# Patient Record
Sex: Male | Born: 1945 | Race: Black or African American | Hispanic: No | Marital: Married | Smoking: Former smoker
Health system: Southern US, Community
[De-identification: ages and names within clinical notes are randomized; demographics above are authoritative.]

## PROBLEM LIST (undated history)

## (undated) DIAGNOSIS — R569 Unspecified convulsions: Secondary | ICD-10-CM

## (undated) DIAGNOSIS — R0602 Shortness of breath: Secondary | ICD-10-CM

## (undated) DIAGNOSIS — I2510 Atherosclerotic heart disease of native coronary artery without angina pectoris: Secondary | ICD-10-CM

## (undated) DIAGNOSIS — I639 Cerebral infarction, unspecified: Secondary | ICD-10-CM

## (undated) DIAGNOSIS — IMO0001 Reserved for inherently not codable concepts without codable children: Secondary | ICD-10-CM

## (undated) DIAGNOSIS — Z923 Personal history of irradiation: Secondary | ICD-10-CM

## (undated) DIAGNOSIS — J189 Pneumonia, unspecified organism: Secondary | ICD-10-CM

## (undated) DIAGNOSIS — C159 Malignant neoplasm of esophagus, unspecified: Secondary | ICD-10-CM

## (undated) DIAGNOSIS — IMO0002 Reserved for concepts with insufficient information to code with codable children: Secondary | ICD-10-CM

## (undated) DIAGNOSIS — J449 Chronic obstructive pulmonary disease, unspecified: Secondary | ICD-10-CM

## (undated) DIAGNOSIS — I10 Essential (primary) hypertension: Secondary | ICD-10-CM

## (undated) HISTORY — DX: Chronic obstructive pulmonary disease, unspecified: J44.9

## (undated) HISTORY — PX: CORONARY ARTERY BYPASS GRAFT: SHX141

## (undated) HISTORY — DX: Personal history of irradiation: Z92.3

## (undated) HISTORY — PX: CARDIAC VALVE SURGERY: SHX40

## (undated) HISTORY — DX: Cerebral infarction, unspecified: I63.9

## (undated) HISTORY — PX: CARDIAC CATHETERIZATION: SHX172

## (undated) HISTORY — DX: Malignant neoplasm of esophagus, unspecified: C15.9

---

## 1998-03-26 ENCOUNTER — Emergency Department (HOSPITAL_COMMUNITY): Admission: EM | Admit: 1998-03-26 | Discharge: 1998-03-26 | Payer: Self-pay

## 1998-06-24 ENCOUNTER — Emergency Department (HOSPITAL_COMMUNITY): Admission: EM | Admit: 1998-06-24 | Discharge: 1998-06-25 | Payer: Self-pay | Admitting: Emergency Medicine

## 1998-06-25 ENCOUNTER — Encounter: Payer: Self-pay | Admitting: Emergency Medicine

## 1998-06-25 ENCOUNTER — Emergency Department (HOSPITAL_COMMUNITY): Admission: EM | Admit: 1998-06-25 | Discharge: 1998-06-25 | Payer: Self-pay | Admitting: Emergency Medicine

## 1999-07-03 ENCOUNTER — Emergency Department (HOSPITAL_COMMUNITY): Admission: EM | Admit: 1999-07-03 | Discharge: 1999-07-03 | Payer: Self-pay | Admitting: Emergency Medicine

## 1999-07-03 ENCOUNTER — Encounter: Payer: Self-pay | Admitting: Emergency Medicine

## 1999-10-29 ENCOUNTER — Emergency Department (HOSPITAL_COMMUNITY): Admission: EM | Admit: 1999-10-29 | Discharge: 1999-10-29 | Payer: Self-pay

## 2000-10-14 ENCOUNTER — Emergency Department (HOSPITAL_COMMUNITY): Admission: EM | Admit: 2000-10-14 | Discharge: 2000-10-14 | Payer: Self-pay | Admitting: *Deleted

## 2001-04-27 ENCOUNTER — Encounter: Payer: Self-pay | Admitting: Emergency Medicine

## 2001-04-27 ENCOUNTER — Emergency Department (HOSPITAL_COMMUNITY): Admission: EM | Admit: 2001-04-27 | Discharge: 2001-04-28 | Payer: Self-pay | Admitting: Emergency Medicine

## 2001-07-21 ENCOUNTER — Encounter (INDEPENDENT_AMBULATORY_CARE_PROVIDER_SITE_OTHER): Payer: Self-pay | Admitting: *Deleted

## 2001-07-21 ENCOUNTER — Encounter: Payer: Self-pay | Admitting: Neurosurgery

## 2001-07-21 ENCOUNTER — Inpatient Hospital Stay (HOSPITAL_COMMUNITY): Admission: EM | Admit: 2001-07-21 | Discharge: 2001-08-01 | Payer: Self-pay | Admitting: *Deleted

## 2001-07-22 ENCOUNTER — Encounter: Payer: Self-pay | Admitting: Neurosurgery

## 2001-07-23 ENCOUNTER — Encounter: Payer: Self-pay | Admitting: Neurosurgery

## 2001-07-24 ENCOUNTER — Encounter: Payer: Self-pay | Admitting: Neurosurgery

## 2001-07-26 ENCOUNTER — Encounter: Payer: Self-pay | Admitting: Neurosurgery

## 2001-07-27 ENCOUNTER — Encounter: Payer: Self-pay | Admitting: Neurosurgery

## 2001-07-30 ENCOUNTER — Encounter: Payer: Self-pay | Admitting: Neurosurgery

## 2002-02-01 ENCOUNTER — Encounter: Payer: Self-pay | Admitting: Neurosurgery

## 2002-02-03 ENCOUNTER — Inpatient Hospital Stay (HOSPITAL_COMMUNITY): Admission: RE | Admit: 2002-02-03 | Discharge: 2002-02-04 | Payer: Self-pay | Admitting: Neurosurgery

## 2002-06-11 ENCOUNTER — Emergency Department (HOSPITAL_COMMUNITY): Admission: EM | Admit: 2002-06-11 | Discharge: 2002-06-11 | Payer: Self-pay | Admitting: Emergency Medicine

## 2002-06-11 ENCOUNTER — Encounter: Payer: Self-pay | Admitting: General Surgery

## 2005-03-10 ENCOUNTER — Emergency Department (HOSPITAL_COMMUNITY): Admission: EM | Admit: 2005-03-10 | Discharge: 2005-03-10 | Payer: Self-pay | Admitting: Emergency Medicine

## 2005-08-27 ENCOUNTER — Emergency Department (HOSPITAL_COMMUNITY): Admission: EM | Admit: 2005-08-27 | Discharge: 2005-08-27 | Payer: Self-pay | Admitting: Emergency Medicine

## 2005-11-21 ENCOUNTER — Emergency Department (HOSPITAL_COMMUNITY): Admission: EM | Admit: 2005-11-21 | Discharge: 2005-11-21 | Payer: Self-pay | Admitting: Emergency Medicine

## 2006-02-26 ENCOUNTER — Emergency Department (HOSPITAL_COMMUNITY): Admission: EM | Admit: 2006-02-26 | Discharge: 2006-02-26 | Payer: Self-pay | Admitting: Emergency Medicine

## 2007-03-02 ENCOUNTER — Emergency Department (HOSPITAL_COMMUNITY): Admission: EM | Admit: 2007-03-02 | Discharge: 2007-03-02 | Payer: Self-pay | Admitting: Emergency Medicine

## 2007-09-23 ENCOUNTER — Emergency Department (HOSPITAL_COMMUNITY): Admission: EM | Admit: 2007-09-23 | Discharge: 2007-09-23 | Payer: Self-pay | Admitting: Emergency Medicine

## 2007-12-21 ENCOUNTER — Emergency Department (HOSPITAL_COMMUNITY): Admission: EM | Admit: 2007-12-21 | Discharge: 2007-12-21 | Payer: Self-pay | Admitting: Emergency Medicine

## 2008-09-19 ENCOUNTER — Emergency Department (HOSPITAL_COMMUNITY): Admission: EM | Admit: 2008-09-19 | Discharge: 2008-09-19 | Payer: Self-pay | Admitting: Internal Medicine

## 2009-06-01 ENCOUNTER — Emergency Department (HOSPITAL_COMMUNITY): Admission: EM | Admit: 2009-06-01 | Discharge: 2009-06-01 | Payer: Self-pay | Admitting: Emergency Medicine

## 2009-06-20 ENCOUNTER — Encounter: Admission: RE | Admit: 2009-06-20 | Discharge: 2009-06-20 | Payer: Self-pay | Admitting: Internal Medicine

## 2009-10-08 ENCOUNTER — Emergency Department (HOSPITAL_COMMUNITY): Admission: EM | Admit: 2009-10-08 | Discharge: 2009-10-09 | Payer: Self-pay | Admitting: Emergency Medicine

## 2010-08-05 LAB — DIFFERENTIAL
Eosinophils Absolute: 0.1 10*3/uL (ref 0.0–0.7)
Eosinophils Relative: 1 % (ref 0–5)
Lymphocytes Relative: 32 % (ref 12–46)
Monocytes Absolute: 0.6 10*3/uL (ref 0.1–1.0)
Neutro Abs: 3.8 10*3/uL (ref 1.7–7.7)
Neutrophils Relative %: 58 % (ref 43–77)

## 2010-08-05 LAB — CBC
MCV: 89.5 fL (ref 78.0–100.0)
Platelets: 167 10*3/uL (ref 150–400)
RDW: 15 % (ref 11.5–15.5)

## 2010-08-05 LAB — PROTIME-INR
INR: 1.14 (ref 0.00–1.49)
Prothrombin Time: 14.5 seconds (ref 11.6–15.2)

## 2010-08-05 LAB — BASIC METABOLIC PANEL
BUN: 16 mg/dL (ref 6–23)
Chloride: 103 mEq/L (ref 96–112)
GFR calc Af Amer: >60 mL/min (ref >60)
GFR calc non Af Amer: >60 mL/min (ref >60)
Glucose, Bld: 120 mg/dL — ABNORMAL HIGH (ref 70–99)
Potassium: 3.8 mEq/L (ref 3.5–5.1)

## 2010-08-05 LAB — APTT: aPTT: 29 seconds (ref 24–37)

## 2010-08-05 LAB — GLUCOSE, CAPILLARY

## 2010-08-27 LAB — RAPID URINE DRUG SCREEN, HOSP PERFORMED
Benzodiazepines: NOT DETECTED
Cocaine: NOT DETECTED
Tetrahydrocannabinol: NOT DETECTED

## 2010-08-27 LAB — DIFFERENTIAL
Basophils Absolute: 0 10*3/uL (ref 0.0–0.1)
Basophils Relative: 0 % (ref 0–1)
Eosinophils Relative: 1 % (ref 0–5)
Lymphs Abs: 1.9 10*3/uL (ref 0.7–4.0)
Monocytes Absolute: 0.6 10*3/uL (ref 0.1–1.0)
Neutrophils Relative %: 57 % (ref 43–77)

## 2010-08-27 LAB — BASIC METABOLIC PANEL
BUN: 15 mg/dL (ref 6–23)
Calcium: 8.8 mg/dL (ref 8.4–10.5)
Chloride: 99 mEq/L (ref 96–112)
Creatinine, Ser: 1.1 mg/dL (ref 0.4–1.5)
GFR calc Af Amer: >60 mL/min (ref >60)
Glucose, Bld: 142 mg/dL — ABNORMAL HIGH (ref 70–99)
Potassium: 4.3 mEq/L (ref 3.5–5.1)

## 2010-08-27 LAB — POCT CARDIAC MARKERS
Myoglobin, poc: 86.2 ng/mL (ref 12–200)
Troponin i, poc: <0.05 ng/mL (ref 0.00–0.09)

## 2010-08-27 LAB — POCT I-STAT, CHEM 8
Glucose, Bld: 138 mg/dL — ABNORMAL HIGH (ref 70–99)
HCT: 46 % (ref 39.0–52.0)
Hemoglobin: 15.6 g/dL (ref 13.0–17.0)
Potassium: 4.2 mEq/L (ref 3.5–5.1)

## 2010-08-27 LAB — URINALYSIS, ROUTINE W REFLEX MICROSCOPIC
Bilirubin Urine: NEGATIVE
Glucose, UA: NEGATIVE mg/dL
Ketones, ur: 15 mg/dL — AB
Leukocytes, UA: NEGATIVE
Nitrite: NEGATIVE
Protein, ur: 30 mg/dL — AB
Urobilinogen, UA: 1 mg/dL (ref 0.0–1.0)
pH: 5.5 (ref 5.0–8.0)

## 2010-08-27 LAB — ETHANOL: Alcohol, Ethyl (B): <5 mg/dL (ref 0–10)

## 2010-08-27 LAB — CBC
HCT: 42.5 % (ref 39.0–52.0)
Hemoglobin: 14.8 g/dL (ref 13.0–17.0)

## 2010-10-04 NOTE — Op Note (Signed)
[place]. [facility]  Patient:    Andre Guzman, Andre Guzman Visit Number: [id number] MRN: [id number]          Service Type: MED Location: 3100 [id number] [id number] Attending Physician:  Barton Fanny Dictated by:   Hewitt Shorts, M.D. Proc. Date: 07/21/01 Admit Date:  07/21/2001                             Operative Report  PREOPERATIVE DIAGNOSIS:  Chronic right hemispheric subdural hematoma.  POSTOPERATIVE DIAGNOSIS:  Subacute right hemispheric subdural hematoma.  OPERATION PERFORMED:  Right frontal temporoparietal craniotomy and evacuation of subdural hematoma.  SURGEON:  Hewitt Shorts, M.D.  ASSISTANT:  Tanya Nones. Jeral Fruit, M.D.  ANESTHESIA:  General endotracheal.  INDICATIONS FOR PROCEDURE:  The patient is a 65 year old man who presented with seizures and was found to have on CT scan a large right hemispheric subdural hematoma.  Decision was made to proceed with craniotomy and evacuation of such.  DESCRIPTION OF PROCEDURE:  The patient was brought to the operating room and placed under general endotracheal anesthesia.  The patients scalp was shaved and he was placed in a donut headrest with a roll beneath the right shoulder and the head gently turned to the left.  The scalp was prepped with Betadine soap and solution and draped in sterile fashion.  A horizontal incision was made paralleling the midline and over the frontal parietal  ____________ . The line of incision was infiltrated with local anesthetic with epinephrine. The incision was made and carried down through the subcutaneous tissues, galea and Raney clips were applied to the scalp edges to maintain hemostasis. Self-retaining retractors were placed and the scalp reflected to either side. The temporalis muscle was elevated along the superior aspect and then a single bur hole was made.  The dura was then entered from the overlying skull and then using the craniotome attachment, of the  [facility] Max drill, the bone flap was turned and elevated.  The dura was tacked up around the margins of the craniotomy with 4-0 Nurolon sutures and then the dura was opened in a U-shape fashion hinged towards the midline.  A thick parietal subdural membrane was encountered.  This was opened and subacute hematoma was encountered.  There were further membranes and it appeared that there were multiple layers of subacute hematoma and membranes.  A portion of this was saved for permanent specimen.  The rest was aspirated away.  We irrigated the subdural space with saline solution.  During this portion of the procedure, the patients anesthetic and neuromuscular blockade had worn off and he began to move and cough.  The brain swelled.  Anesthesia was increased and the patient was given 200 cc of 20% Osmitrol and 20 mg of Lasix. We good a good diuresis and the brain was able to relax and we were able to get both dural closure and subsequently we replaced the bone flap.  Around the margins of the dural opening the parietal subdural membrane was tacked up to the dura with titanium hemoclips.  The dura was closed with interrupted 4-0 Nurolon sutures placed in interrupted fashion.  The bone flap was secured in place with four Osteomed plates and the dura was tacked up to the bone flap with 4-0 Nurolon sutures (Poppen stitches) and then the galea was closed with interrupted inverted 2-0 undyed Vicryl sutures and the skin was closed with surgical staples.  The wound was  dressed  with Adaptic and sterile  gauze.  The patient tolerated the procedure well.  Estimated blood loss for this procedure was 150 cc.  Sponge, needle and instrument counts were correct.  Because of the swelling during surgery, it was decided to leave the patient intubated and transfer him to the recovery room to be subsequently transferred to the neurosurgical intensive care unit for further care. Dictated by:   Hewitt Shorts,  M.D. Attending Physician:  Barton Fanny DD:  07/21/01 TD:  07/21/01 Job: [id number]
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
## 2010-10-04 NOTE — Op Note (Signed)
   NAME:  Andre Guzman, Andre Guzman                         ACCOUNT NO.:  [id number]   MEDICAL RECORD NO.:  [id number]                   PATIENT TYPE:  INP   LOCATION:  NA                                   FACILITY:  [facility]   PHYSICIAN:  Hewitt Shorts, M.D.            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   OPERATION PERFORMED:  Left frontoparietal cranioplasty with titanium mesh.   SURGEON:  Hewitt Shorts, M.D.   ASSISTANT:  Mena Goes. Franky Macho, M.D.   ANESTHESIA:  General endotracheal.   INDICATIONS FOR PROCEDURE:  The patient is a 65 year old man about six  months status post right frontoparietal craniectomy for evacuation of  subdural hematoma.  He returns now for cranioplasty.   DESCRIPTION OF PROCEDURE:  The patient was brought to the operating room and  placed under general endotracheal anesthesia.  The patient was turned  towards the left with rolls beneath the right shoulder.  The scalp was  shaved and then prepped with Betadine soap and solution and draped in  sterile fashion.  The previous horizontal parasagittal incision was  reopened.  Dissection was carefully carried out to release the dura and scar  tissue within the cranial defect undisturbed.  We dissected around the  margins of the craniotomy and once the margins were fully exposed, self-  retaining retractors were placed.  We took a sheet of titanium mesh.  It was  cut and shaped to the side of the cranial defect and secured down with six  1.5 mm x 5 mm self drilling, self tapping screws.  The mesh was secured to  the skull and then we proceeded with closure.  The galea was closed with  inverted interrupted 0 and 2-0 undyed Vicryl sutures and skin edges were  closed with surgical staples.  The wound was dressed with Adaptic, cotton  balls, gauze sponge, wrapped with two Klings and one Kerlix.  The patient  tolerated the procedure well.  The estimated blood loss was 300 cc.  Sponge  and instrument counts were correct.  Following surgery the patient is to be  reversed from anesthetic, extubated and transferred to the recovery room for  further care.                                                 Hewitt Shorts, M.D.    RWN/MEDQ  D:  02/03/2002  T:  02/04/2002  Job:  [id number]
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
## 2010-10-04 NOTE — Op Note (Signed)
[place]. [facility]  Patient:    Andre Guzman, Andre Guzman Visit Number: [id number] MRN: [id number]          Service Type: MED Location: 3100 3113 01 Attending Physician:  Barton Fanny Dictated by:   Hewitt Shorts, M.D. Proc. Date: 07/23/01 Admit Date:  07/21/2001                             Operative Report  PREOPERATIVE DIAGNOSIS:  Right hemispheric acute postoperative subdural hematoma.  POSTOPERATIVE DIAGNOSIS:  Right hemispheric acute postoperative subdural hematoma plus subgaleal hematoma and minimal epidural hematoma.  PROCEDURE:  Right frontal temporal parietal craniectomy and evacuation of subgaleal, epidural, and subdural hematomas.  SURGEON:  Hewitt Shorts, M.D.  ASSISTANT:  Payton Doughty, M.D.  ANESTHESIA:  General endotracheal.  INDICATIONS:  The patient is a 65 year old man who presented with a seizure and was found to have a subacute subdural hematoma.  He has a significant history of alcoholism.  He was taken to surgery two days ago for evacuation of the hematoma and did well.  However, follow up scan today showed accumulation of a postoperative acute subdural hematoma and the decision was made to return the patient to the operating room for evacuation of such.  DESCRIPTION OF PROCEDURE:  The patient was brought to the operating room and placed under general endotracheal anesthesia.  The patient was placed in three pin [place]field head holder.  The previous surgical staples were removed and the scalp was prepped with Betadine soap and solution, and draped in a sterile fashion.  The previous parasagittal incision was reopened and self-retaining retractors were placed.  The OsteoMed plates were unscrewed from the skull and the bone flap removed.  There was a moderate amount of subgaleal hematoma that was irrigated away with saline.  There was a small amount of epidural hematoma that was removed, and then the previous dural  closure was reopened, and a clotted subdural hematoma was removed.  This was done by gentle irrigation and suction aspiration.  The brain was pulsating well and a good decompression of the lower hemisphere was achieved.  Notably, no point of bleeding was noted in the scalp, the epidural space, or the subdural space.  The subdural space was irrigated until clear.  We did give the patient two units of fresh frozen plasma despite normal coagulation studies due to his significant alcoholism and the concern about his protein status and clotting factor status in conjunction with the development of a postoperative hematoma.  Once the irrigation from the subdural space ran clear, we placed two 10 mm flat Jackson-Pratt drains in the subdural space.  They were brought out through a separate stab incision and were sutured to the scalp with 3-0 nylon suture.  The dura was then closed in an interrupted manner with 4-0 Nurolon sutures, and then a medium Hemovac drain was placed in the epidural/subgaleal space, and brought out through a separate stab incision, and was likewise sutured to the scalp with 3-0 nylon suture.  The bone flap was not replaced and the scalp was closed with the galea being closed with interrupted and inverted 2-0 undyed Vicryl sutures and the skin is being closed with surgical staples.  The wound was dressed with Adaptic, gauze sponges, cotton ball further gauze sponges, and wrapped with two Kerlix and two Kling.  The patient tolerated the procedure well.  Estimated blood loss was less than 75 cc.  Sponge and needle count were correct.  Following the surgery, the patient was reversed from the anesthetic, extubated, and was transferred to the recovery room for further care to be subsequently transferred to the neurosurgical intensive care unit. Dictated by:   Hewitt Shorts, M.D. Attending Physician:  Barton Fanny DD:  07/23/01 TD:  07/26/01 Job:  [id number]

## 2010-10-04 NOTE — Discharge Summary (Signed)
[place]. [facility]  Patient:    Andre Guzman, Andre Guzman Visit Number: [id number] MRN: [id number]          Service Type: MED Location: 3000 3010 01 Attending Physician:  Barton Fanny Dictated by:   Mena Goes. Franky Macho, M.D. Admit Date:  07/21/2001 Discharge Date: 08/01/2001                             Discharge Summary  ADMITTING DIAGNOSIS:  Chronic subdural hematoma.  DISCHARGE DIAGNOSES: 1. Chronic subdural hematoma. 2. Acute subdural hematoma. 3. History of hypertension. 4. History of seizure disorder.  INDICATION:  Seizure disorder.  HISTORY OF PRESENT ILLNESS:  Mr. Moist was admitted to the hospital on July 21, 2001, and found to have a chronic subdural hematoma on the right side.  He was therefore taken to the operating room on July 21, 2001, and had a right frontotemporoparietal craniotomy and the subdural hematoma evacuated.  On July 23, 2001, he was noted to be lethargic.  A repeat CT was performed, and it showed an acute subdural hematoma in the surgical site.  He was therefore taken back to the operating room, where he underwent a redo craniotomy for subdural hematoma evacuation.  Postoperatively he has done quite well.  He has been cleared by physical therapy and occupational therapy.  Dilantin level on the 14th was 10.5.  He has had no problems with seizures while in the hospital.  His wound is clean, dry, without signs of infection.  DISCHARGE MEDICATIONS: 1. Dilantin 200 mg in the a.m. and 300 mg in the p.m. 2. He will take only Tylenol for pain.  FOLLOW-UP:  Return appointment to see Dr. Newell Coral in two weeks with a Dilantin level.Dictated by:   Mena Goes. Franky Macho, M.D. Attending Physician:  Barton Fanny DD:  08/01/01 TD:  08/02/01 Job: [id number]

## 2010-11-11 ENCOUNTER — Emergency Department (HOSPITAL_COMMUNITY)
Admission: EM | Admit: 2010-11-11 | Discharge: 2010-11-11 | Disposition: A | Discharge status: Home / Self Care | Payer: Medicare Other | Attending: Emergency Medicine | Admitting: Emergency Medicine

## 2010-11-11 DIAGNOSIS — G40909 Epilepsy, unspecified, not intractable, without status epilepticus: Secondary | ICD-10-CM | POA: Insufficient documentation

## 2010-11-11 DIAGNOSIS — I10 Essential (primary) hypertension: Secondary | ICD-10-CM | POA: Insufficient documentation

## 2010-11-11 LAB — POCT I-STAT, CHEM 8
Calcium, Ion: 1.01 mmol/L — ABNORMAL LOW (ref 1.12–1.32)
Chloride: 97 mEq/L (ref 96–112)
Glucose, Bld: 107 mg/dL — ABNORMAL HIGH (ref 70–99)
HCT: 42 % (ref 39.0–52.0)
Hemoglobin: 14.3 g/dL (ref 13.0–17.0)
Potassium: 4 mEq/L (ref 3.5–5.1)

## 2010-11-11 LAB — PHENYTOIN LEVEL, TOTAL: Phenytoin Lvl: 16.1 ug/mL (ref 10.0–20.0)

## 2011-02-12 LAB — BASIC METABOLIC PANEL
BUN: 12
Calcium: 8.7
GFR calc non Af Amer: >60
Glucose, Bld: 194 — ABNORMAL HIGH
Potassium: 4

## 2011-02-12 LAB — ETHANOL: Alcohol, Ethyl (B): <5

## 2011-02-12 LAB — PHENYTOIN LEVEL, TOTAL: Phenytoin Lvl: 10.3

## 2011-02-12 LAB — DIFFERENTIAL
Basophils Absolute: 0
Lymphocytes Relative: 18
Lymphs Abs: 1.4
Neutro Abs: 6
Neutrophils Relative %: 77

## 2011-02-12 LAB — CBC
Platelets: 163
RDW: 15.6 — ABNORMAL HIGH
WBC: 7.8

## 2011-02-14 LAB — BASIC METABOLIC PANEL
Calcium: 8.7
GFR calc Af Amer: >60
GFR calc non Af Amer: >60
Glucose, Bld: 99
Potassium: 3.6
Sodium: 136

## 2011-02-14 LAB — DIFFERENTIAL
Basophils Absolute: 0
Lymphocytes Relative: 22
Lymphs Abs: 1.6
Monocytes Absolute: 0.5
Neutro Abs: 5

## 2011-02-14 LAB — CBC
Hemoglobin: 14.9
RBC: 4.7
RDW: 14.4
WBC: 7.1

## 2011-02-14 LAB — ETHANOL: Alcohol, Ethyl (B): <5

## 2011-06-20 ENCOUNTER — Encounter (HOSPITAL_COMMUNITY): Payer: Self-pay | Admitting: Respiratory Therapy
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
## 2011-07-01 ENCOUNTER — Ambulatory Visit (HOSPITAL_COMMUNITY)
Admission: RE | Admit: 2011-07-01 | Discharge: 2011-07-01 | Disposition: A | Discharge status: Home / Self Care | Payer: Medicare Other | Source: Ambulatory Visit | Attending: Cardiology | Admitting: Cardiology

## 2011-07-01 ENCOUNTER — Encounter (HOSPITAL_COMMUNITY)
Admission: RE | Disposition: A | Discharge status: Home / Self Care | Payer: Self-pay | Source: Ambulatory Visit | Attending: Cardiology

## 2011-07-01 DIAGNOSIS — R0602 Shortness of breath: Secondary | ICD-10-CM | POA: Insufficient documentation

## 2011-07-01 DIAGNOSIS — Z951 Presence of aortocoronary bypass graft: Secondary | ICD-10-CM | POA: Insufficient documentation

## 2011-07-01 DIAGNOSIS — I2510 Atherosclerotic heart disease of native coronary artery without angina pectoris: Secondary | ICD-10-CM | POA: Insufficient documentation

## 2011-07-01 HISTORY — PX: LEFT HEART CATHETERIZATION WITH CORONARY ANGIOGRAM: SHX5451

## 2011-07-01 HISTORY — PX: LOWER EXTREMITY ANGIOGRAM: SHX5508

## 2011-07-01 SURGERY — ANGIOGRAM, LOWER EXTREMITY
Anesthesia: LOCAL

## 2011-07-01 MED ORDER — LIDOCAINE HCL (PF) 1 % IJ SOLN
INTRAMUSCULAR | Status: AC
  Filled 2011-07-01: qty 30

## 2011-07-01 MED ORDER — SODIUM CHLORIDE 0.9 % IJ SOLN: 3.0000 mL | INTRAMUSCULAR | Status: DC | PRN

## 2011-07-01 MED ORDER — MIDAZOLAM HCL 2 MG/2ML IJ SOLN
INTRAMUSCULAR | Status: AC
  Filled 2011-07-01: qty 2

## 2011-07-01 MED ORDER — SODIUM CHLORIDE 0.9 % IV SOLN
INTRAVENOUS | Status: DC
  Administered 2011-07-01: 1000 mL via INTRAVENOUS

## 2011-07-01 MED ORDER — BIVALIRUDIN 250 MG IV SOLR
INTRAVENOUS | Status: AC
  Filled 2011-07-01: qty 250

## 2011-07-01 MED ORDER — SODIUM CHLORIDE 0.9 % IV SOLN: 1.0000 mL/kg/h | INTRAVENOUS | Status: DC

## 2011-07-01 MED ORDER — SODIUM CHLORIDE 0.9 % IJ SOLN: 3.0000 mL | Freq: Two times a day (BID) | INTRAMUSCULAR | Status: DC

## 2011-07-01 MED ORDER — HYDROMORPHONE HCL PF 2 MG/ML IJ SOLN
INTRAMUSCULAR | Status: AC
  Filled 2011-07-01: qty 1

## 2011-07-01 MED ORDER — HEPARIN (PORCINE) IN NACL 2-0.9 UNIT/ML-% IJ SOLN
INTRAMUSCULAR | Status: AC
  Filled 2011-07-01: qty 2000

## 2011-07-01 MED ORDER — ASPIRIN 81 MG PO CHEW: 324.0000 mg | CHEWABLE_TABLET | ORAL | Status: DC

## 2011-07-01 MED ORDER — SODIUM CHLORIDE 0.9 % IV SOLN: 250.0000 mL | INTRAVENOUS | Status: DC | PRN

## 2011-07-01 NOTE — Interval H&P Note (Signed)
History and Physical Interval Note:  07/01/2011 7:34 AM  Andre Guzman  has presented today for surgery, with the diagnosis of chest pain  The various methods of treatment have been discussed with the patient and family. After consideration of risks, benefits and other options for treatment, the patient has consented to  Procedure(s) (LRB): LOWER EXTREMITY ANGIOGRAM (N/A) LEFT HEART CATHETERIZATION WITH CORONARY ANGIOGRAM (N/A) as a surgical intervention .  The patients' history has been reviewed, patient examined, no change in status, stable for surgery.  I have reviewed the patients' chart and labs.  Questions were answered to the patient's satisfaction.     [name]
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
## 2011-07-01 NOTE — Op Note (Signed)
Left heart cath, Abdominal aortogram with bifemoral runoff. Indication dyspnea and abnormal stress test and PAD with claudication. Dictation # [id number]

## 2011-07-01 NOTE — Discharge Instructions (Signed)

## 2011-07-01 NOTE — Interval H&P Note (Signed)
History and Physical Interval Note:  07/01/2011 7:34 AM  Andre Guzman  has presented today for surgery, with the diagnosis of chest pain  The various methods of treatment have been discussed with the patient and family. After consideration of risks, benefits and other options for treatment, the patient has consented to  Procedure(s) (LRB): LOWER EXTREMITY ANGIOGRAM (N/A) LEFT HEART CATHETERIZATION WITH CORONARY ANGIOGRAM (N/A) as a surgical intervention .  The patients' history has been reviewed, patient examined, no change in status, stable for surgery.  I have reviewed the patients' chart and labs.  Questions were answered to the patient's satisfaction.     [name],JAGADEESH R  No change since last evaluation in HPI

## 2011-07-08 ENCOUNTER — Encounter (HOSPITAL_COMMUNITY): Payer: Self-pay | Admitting: Pharmacy Technician

## 2011-07-22 ENCOUNTER — Other Ambulatory Visit: Payer: Self-pay

## 2011-07-22 ENCOUNTER — Encounter (HOSPITAL_COMMUNITY)
Admission: RE | Disposition: A | Discharge status: Home / Self Care | Payer: Self-pay | Source: Ambulatory Visit | Attending: Cardiology

## 2011-07-22 ENCOUNTER — Encounter (HOSPITAL_COMMUNITY): Payer: Self-pay | Admitting: General Practice

## 2011-07-22 ENCOUNTER — Ambulatory Visit (HOSPITAL_COMMUNITY)
Admission: RE | Admit: 2011-07-22 | Discharge: 2011-07-23 | Disposition: A | Discharge status: Home / Self Care | Payer: Medicare Other | Source: Ambulatory Visit | Attending: Cardiology | Admitting: Cardiology

## 2011-07-22 DIAGNOSIS — I70219 Atherosclerosis of native arteries of extremities with intermittent claudication, unspecified extremity: Secondary | ICD-10-CM | POA: Insufficient documentation

## 2011-07-22 DIAGNOSIS — Z23 Encounter for immunization: Secondary | ICD-10-CM | POA: Insufficient documentation

## 2011-07-22 DIAGNOSIS — I2510 Atherosclerotic heart disease of native coronary artery without angina pectoris: Secondary | ICD-10-CM | POA: Diagnosis present

## 2011-07-22 DIAGNOSIS — Z87891 Personal history of nicotine dependence: Secondary | ICD-10-CM | POA: Insufficient documentation

## 2011-07-22 DIAGNOSIS — I10 Essential (primary) hypertension: Secondary | ICD-10-CM | POA: Diagnosis present

## 2011-07-22 DIAGNOSIS — E785 Hyperlipidemia, unspecified: Secondary | ICD-10-CM | POA: Insufficient documentation

## 2011-07-22 DIAGNOSIS — R0989 Other specified symptoms and signs involving the circulatory and respiratory systems: Secondary | ICD-10-CM | POA: Diagnosis present

## 2011-07-22 HISTORY — PX: PERCUTANEOUS CORONARY ROTOBLATOR INTERVENTION (PCI-R): SHX5484

## 2011-07-22 HISTORY — DX: Atherosclerotic heart disease of native coronary artery without angina pectoris: I25.10

## 2011-07-22 HISTORY — DX: Shortness of breath: R06.02

## 2011-07-22 HISTORY — DX: Pneumonia, unspecified organism: J18.9

## 2011-07-22 HISTORY — DX: Unspecified convulsions: R56.9

## 2011-07-22 HISTORY — DX: Essential (primary) hypertension: I10

## 2011-07-22 HISTORY — PX: CORONARY ANGIOPLASTY WITH STENT PLACEMENT: SHX49

## 2011-07-22 LAB — BASIC METABOLIC PANEL
BUN: 11 mg/dL (ref 6–23)
Calcium: 8.6 mg/dL (ref 8.4–10.5)
GFR calc non Af Amer: 85 mL/min — ABNORMAL LOW (ref >90)
Glucose, Bld: 96 mg/dL (ref 70–99)

## 2011-07-22 LAB — CBC
HCT: 34.3 % — ABNORMAL LOW (ref 39.0–52.0)
Hemoglobin: 12.1 g/dL — ABNORMAL LOW (ref 13.0–17.0)
MCH: 30.5 pg (ref 26.0–34.0)
MCHC: 35.3 g/dL (ref 30.0–36.0)
RDW: 14.4 % (ref 11.5–15.5)

## 2011-07-22 LAB — POCT ACTIVATED CLOTTING TIME: Activated Clotting Time: 375 seconds

## 2011-07-22 SURGERY — PERCUTANEOUS CORONARY ROTOBLATOR INTERVENTION (PCI-R)
Anesthesia: LOCAL

## 2011-07-22 MED ORDER — ONDANSETRON HCL 4 MG/2ML IJ SOLN: 4.0000 mg | Freq: Four times a day (QID) | INTRAMUSCULAR | Status: DC | PRN

## 2011-07-22 MED ORDER — HEPARIN SODIUM (PORCINE) 1000 UNIT/ML IJ SOLN
INTRAMUSCULAR | Status: AC
  Filled 2011-07-22: qty 1

## 2011-07-22 MED ORDER — VERAPAMIL HCL 2.5 MG/ML IV SOLN
INTRAVENOUS | Status: AC
  Filled 2011-07-22: qty 2

## 2011-07-22 MED ORDER — NITROGLYCERIN 0.2 MG/ML ON CALL CATH LAB
INTRAVENOUS | Status: AC
  Filled 2011-07-22: qty 1

## 2011-07-22 MED ORDER — AMLODIPINE BESYLATE 10 MG PO TABS
10.0000 mg | ORAL_TABLET | Freq: Every day | ORAL | Status: DC
  Filled 2011-07-22: qty 1

## 2011-07-22 MED ORDER — CLOPIDOGREL BISULFATE 300 MG PO TABS
ORAL_TABLET | ORAL | Status: AC
  Filled 2011-07-22: qty 2

## 2011-07-22 MED ORDER — BIVALIRUDIN 250 MG IV SOLR
INTRAVENOUS | Status: AC
  Filled 2011-07-22: qty 250

## 2011-07-22 MED ORDER — ASPIRIN 81 MG PO CHEW
324.0000 mg | CHEWABLE_TABLET | ORAL | Status: AC
  Administered 2011-07-22: 324 mg via ORAL
  Filled 2011-07-22: qty 4

## 2011-07-22 MED ORDER — INFLUENZA VIRUS VACC SPLIT PF IM SUSP
0.5000 mL | INTRAMUSCULAR | Status: AC
  Administered 2011-07-22: 0.5 mL via INTRAMUSCULAR
  Filled 2011-07-22: qty 0.5

## 2011-07-22 MED ORDER — SODIUM CHLORIDE 0.9 % IV SOLN
INTRAVENOUS | Status: AC
  Administered 2011-07-22: 11:00:00 via INTRAVENOUS

## 2011-07-22 MED ORDER — ASPIRIN EC 81 MG PO TBEC
81.0000 mg | DELAYED_RELEASE_TABLET | Freq: Every day | ORAL | Status: DC
  Filled 2011-07-22: qty 1

## 2011-07-22 MED ORDER — LIDOCAINE HCL (PF) 1 % IJ SOLN
INTRAMUSCULAR | Status: AC
  Filled 2011-07-22: qty 30

## 2011-07-22 MED ORDER — SODIUM CHLORIDE 0.9 % IV SOLN: INTRAVENOUS | Status: DC

## 2011-07-22 MED ORDER — HEPARIN (PORCINE) IN NACL 2-0.9 UNIT/ML-% IJ SOLN
INTRAMUSCULAR | Status: AC
  Filled 2011-07-22: qty 2000

## 2011-07-22 MED ORDER — SODIUM CHLORIDE 0.9 % IV SOLN
250.0000 mL | INTRAVENOUS | Status: DC | PRN
  Administered 2011-07-22: 250 mL via INTRAVENOUS

## 2011-07-22 MED ORDER — AMLODIPINE-OLMESARTAN 10-40 MG PO TABS: 1.0000 | ORAL_TABLET | Freq: Every day | ORAL | Status: DC

## 2011-07-22 MED ORDER — SODIUM CHLORIDE 0.9 % IJ SOLN: 3.0000 mL | INTRAMUSCULAR | Status: DC | PRN

## 2011-07-22 MED ORDER — MIDAZOLAM HCL 2 MG/2ML IJ SOLN
INTRAMUSCULAR | Status: AC
  Filled 2011-07-22: qty 2

## 2011-07-22 MED ORDER — SODIUM CHLORIDE 0.9 % IJ SOLN: 3.0000 mL | Freq: Two times a day (BID) | INTRAMUSCULAR | Status: DC

## 2011-07-22 MED ORDER — FENTANYL CITRATE 0.05 MG/ML IJ SOLN
INTRAMUSCULAR | Status: AC
  Filled 2011-07-22: qty 2

## 2011-07-22 MED ORDER — CLOPIDOGREL BISULFATE 75 MG PO TABS: 75.0000 mg | ORAL_TABLET | Freq: Every day | ORAL | Status: DC

## 2011-07-22 MED ORDER — PHENYTOIN SODIUM EXTENDED 100 MG PO CAPS
100.0000 mg | ORAL_CAPSULE | Freq: Two times a day (BID) | ORAL | Status: DC
  Administered 2011-07-22: 100 mg via ORAL
  Filled 2011-07-22 (×4): qty 1

## 2011-07-22 MED ORDER — METOPROLOL SUCCINATE ER 100 MG PO TB24
100.0000 mg | ORAL_TABLET | Freq: Every day | ORAL | Status: DC
  Filled 2011-07-22: qty 1

## 2011-07-22 MED ORDER — TERAZOSIN HCL 5 MG PO CAPS
5.0000 mg | ORAL_CAPSULE | Freq: Every day | ORAL | Status: DC
  Administered 2011-07-22: 5 mg via ORAL
  Filled 2011-07-22 (×2): qty 1

## 2011-07-22 MED ORDER — ACETAMINOPHEN 325 MG PO TABS: 650.0000 mg | ORAL_TABLET | ORAL | Status: DC | PRN

## 2011-07-22 MED ORDER — SIMVASTATIN 10 MG PO TABS
10.0000 mg | ORAL_TABLET | Freq: Every day | ORAL | Status: DC
  Filled 2011-07-22: qty 1

## 2011-07-22 MED ORDER — ASPIRIN 81 MG PO CHEW: 81.0000 mg | CHEWABLE_TABLET | Freq: Every day | ORAL | Status: DC

## 2011-07-22 MED ORDER — IRBESARTAN 300 MG PO TABS
300.0000 mg | ORAL_TABLET | Freq: Every day | ORAL | Status: DC
  Filled 2011-07-22: qty 1

## 2011-07-22 MED ORDER — SIMVASTATIN 40 MG PO TABS: 40.0000 mg | ORAL_TABLET | Freq: Every day | ORAL | Status: DC

## 2011-07-22 NOTE — CV Procedure (Signed)
Successful rotostent to ostial and mid circumflex coronary artery with 1.25, 1.5 mm burr followed by 3.0x16 mm Veriflex BMS. Dictation # [id number]
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
## 2011-07-22 NOTE — Cardiovascular Report (Signed)
NAMESHEEHAN, Andre Guzman NO.:  [id number]  MEDICAL RECORD NO.:  [id number]  LOCATION:  2508                         FACILITY:  [facility]  PHYSICIAN:  [name], MD DATE OF BIRTH:  [birth year]  DATE OF PROCEDURE:  07/22/2011 DATE OF DISCHARGE:                           CARDIAC CATHETERIZATION   REFERRING PHYSICIAN:  Dr. Roxan Hockey.  PROCEDURE PERFORMED: 1. Rotational atherectomy of the proximal ostial circumflex and mid     circumflex coronary artery with 1.25 followed by 1.5 mm rotational     atherectomy burr. 2. Successful PTCA and stenting of the proximal and mid circumflex     coronary artery with implantation of a 3.0 x 16 mm Veriflex nondrug-     eluting bare metal stent. 3. Right femoral arteriogram with closure of the right femoral artery     access with Perclose.  INDICATION:  Mr. Andre Guzman is a pleasant 66 year old gentleman who has been having chest pain suggestive of angina pectoris.  He had also been complaining of shortness of breath.  He had undergone outpatient stress testing, which had revealed severe lateral wall ischemia. Cardiac catheterization performed on July 01, 2011, revealed a high- grade calcific 90% stenosis of the circumflex coronary artery.  He is now brought to the cardiac catheterization lab for elective probable rotation atherectomy and angioplasty of the same.  ANGIOGRAPHIC DATA:  Diagnostic angiogram:  Please see the prior dictation dated July 01, 2011.  Briefly, right coronary artery is a moderate caliber vessel with mild diffuse disease.  It is dominant.  Left main coronary artery:  Left main coronary artery is large vessel, smooth and normal with mild calcification.  LAD:  LAD is a large caliber vessel, which is smooth with mild proximal calcification giving origin to 2 large diagonals.  Circumflex:  Circumflex is a very large caliber vessel, with an ostial high-grade 90-95% calcific stenosis.  It gives  origin to large obtuse marginal 1 and continues as a large obtuse marginal 2.  INTERVENTION DATA:  Successful PTCA and rotational atherectomy with utilization of 1.25 followed by 1.5 mm burr, followed by stent implantation with a 3.0 x 16 mm Veriflex bare metal stent into the proximal ostial circumflex and mid circumflex coronary artery.  Overall, stenosis reduced from 95% to 0% with brisk TIMI-2 to TIMI-3 flow improved at the end of the procedure.  A secondary branch of the obtuse marginal was seen filling which was not previously evident prior to angioplasty suggestive of increased flow.  RECOMMENDATION:  The patient will need aspirin indefinitely and he will need Plavix for at least a period of 4-6 weeks.  He has history of intracerebral bleed in the past, and is being managed by Dr. Sharma Covert.  At this point, hence we have utilized a nondrug-eluting stent.  If he has in-stent restenosis, he may be a candidate for coronary artery bypass grafting.  A total of 180 mL of contrast was utilized for intervention procedure.  TECHNICAL INTERVENTION:  Using 6-French right femoral venous access for obtaining a backup for potential need for pacemaker and a 7-French right femoral arterial access, a 7-French XB 3.5 guide catheter was advanced into the ascending aorta and left  main was selectively engaged.  Using Angiomax for anticoagulation, I utilized a Rotafloppy wire and gently advanced it into the distal circumflex coronary artery.  Then, I utilized a 1.25 mm burr and multiple passes were made into the circumflex coronary artery.  Then, we withdrew the Rotafloppy wire back and I advanced a BMW guidewire into the circumflex coronary artery.  I tried to advance a 3 mm balloon as a test for potential utilization of the stent.  However, the 3 mm balloon would not cross the circumflex coronary artery.  Hence we repeated the whole procedure of readvancing the Rotafloppy back into the circumflex  coronary artery with utilization of a 1.25 mm over-the-wire balloon, placing it distally withdrawing the 0.01/4 of an inch BMW wire, and reintroduction of the Rotafloppy wire into the distal circumflex coronary artery.  Having performed this, I utilized a 1.5 mm burr and again multiple passes were made.  Excellent results were evident.  I could not completely exclude a dissection which was very unlikely and I suspected this was probably a calcium shelf as if it was dissection rotation atherectomy would have dissected the whole vessel.  At this point, we tried to see if we can balloon this gently with a 3.25 x 12 mm [place] Quantum which was performed at 6 atmospheric pressure throughout the proximal and mid circumflex coronary artery followed by utilization of a 3.0 x 16 mm bare metal Veriflex stent which advanced with moderate amount of difficulty.  I was able to place the stent right into the ostium and cover the lesion.  The stent deployed at 9 atmospheric pressure for 62 seconds followed by 2nd inflation with the same stent balloon for 10 atmospheric pressure for 10 seconds.  I withdrew the stent balloon back and utilized the [place] Quantum back into the circumflex coronary artery, and multiple inflations throughout the stented segment was performed anywhere from 12-14 atmospheric pressure for 30 seconds each x3.  Intracoronary nitroglycerin was administered and angiography was repeated.  Excellent results were evident.  The guidewire was withdrawn, angiography repeated, guide catheter disengaged pulled out of the body over a J-wire.  Right femoral arteriogram was performed through the arterial access sheath and the access was closed with Perclose with excellent hemostasis.  The venous sheath was sutured in place.  The patient tolerated the procedure well.  No immediate complications were noted.     [name], MD     JRG/MEDQ  D:  07/22/2011  T:  07/22/2011  Job:  [id number]

## 2011-07-22 NOTE — Interval H&P Note (Signed)
History and Physical Interval Note:  07/22/2011 8:17 AM  Andre Guzman  has presented today for surgery, with the diagnosis of CAD  The various methods of treatment have been discussed with the patient and family. After consideration of risks, benefits and other options for treatment, the patient has consented to  Procedure(s) (LRB): PERCUTANEOUS CORONARY ROTOBLATOR INTERVENTION (PCI-R) (N/A) as a surgical intervention .  The patients' history has been reviewed, patient examined, no change in status, stable for surgery.  I have reviewed the patients' chart and labs.  Questions were answered to the patient's satisfaction.     [facility] R  Patient for planned PCI and possible rotational atherectomy of circumflex coronary artery. No change in HPI otherwise. Please see previous cath notes

## 2011-07-23 ENCOUNTER — Other Ambulatory Visit: Payer: Self-pay

## 2011-07-23 DIAGNOSIS — R0609 Other forms of dyspnea: Secondary | ICD-10-CM | POA: Diagnosis present

## 2011-07-23 DIAGNOSIS — I70219 Atherosclerosis of native arteries of extremities with intermittent claudication, unspecified extremity: Secondary | ICD-10-CM | POA: Diagnosis present

## 2011-07-23 DIAGNOSIS — R0989 Other specified symptoms and signs involving the circulatory and respiratory systems: Secondary | ICD-10-CM | POA: Diagnosis present

## 2011-07-23 DIAGNOSIS — I10 Essential (primary) hypertension: Secondary | ICD-10-CM | POA: Diagnosis present

## 2011-07-23 LAB — BASIC METABOLIC PANEL
BUN: 9 mg/dL (ref 6–23)
CO2: 26 mEq/L (ref 19–32)
Chloride: 102 mEq/L (ref 96–112)
Creatinine, Ser: 0.97 mg/dL (ref 0.50–1.35)
GFR calc Af Amer: >90 mL/min (ref >90)
Potassium: 3.8 mEq/L (ref 3.5–5.1)

## 2011-07-23 LAB — CBC
HCT: 34.7 % — ABNORMAL LOW (ref 39.0–52.0)
MCV: 86.5 fL (ref 78.0–100.0)
Platelets: 123 10*3/uL — ABNORMAL LOW (ref 150–400)
RBC: 4.01 MIL/uL — ABNORMAL LOW (ref 4.22–5.81)
RDW: 14.5 % (ref 11.5–15.5)
WBC: 5.4 10*3/uL (ref 4.0–10.5)

## 2011-07-23 MED ORDER — CLOPIDOGREL BISULFATE 75 MG PO TABS: 75.0000 mg | ORAL_TABLET | Freq: Every day | ORAL | Status: AC

## 2011-07-23 MED FILL — Dextrose Inj 5%: INTRAVENOUS | Qty: 50 | Status: AC

## 2011-07-23 NOTE — Plan of Care (Signed)
Problem: Phase II Progression Outcomes Goal: OOB to chair per PCI oders Outcome: Completed/Met Date Met:  07/23/11 OOB tolerated well  Problem: Phase III Progression Outcomes Goal: No angina with increased activity Outcome: Completed/Met Date Met:  07/23/11 No chest pain with activity Goal: Tolerating diet Outcome: Completed/Met Date Met:  07/23/11 Diet tolerated Goal: Vital signs stable Outcome: Completed/Met Date Met:  07/23/11 Medication held yesterday related to bradycardia, Dr. Nadara Eaton aware of hypertension and is adjusting medications at discharge to control hypertension Goal: Vascular site scale level 0 - I Vascular Site Scale Level 0: No bruising/bleeding/hematoma Level I (Mild): Bruising/Ecchymosis, minimal bleeding/ooozing, palpable hematoma < 3 cm Level II (Moderate): Bleeding not affecting hemodynamic parameters, pseudoaneurysm, palpable hematoma > 3 cm  Outcome: Completed/Met Date Met:  07/23/11 Saturated gauze at cath site removed, no further bleeding or oozing noted. Level 0  Problem: Discharge Progression Outcomes Goal: Hemodynamically stable Outcome: Completed/Met Date Met:  07/23/11 See above related to hypertension

## 2011-07-23 NOTE — Discharge Summary (Signed)
Physician Discharge Summary  Patient ID: Andre Guzman MRN: [id number] DOB/AGE: Jan 20, 1946 66 y.o.  Admit date: 07/22/2011 Discharge date: 07/23/2011  Primary Discharge Diagnosis CAD S/P Rotational atherectomy with 1.25 and 1.5 mm burr followed by 3.0x16 mm Veriflex non drug eluting stent implantation to ostial and mid large circumflex coronary artery on 07/22/11. Secondary Discharge Diagnosis Hypertension. 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 Patient not felt to be candidate for long-term duo antiplatelet therapy. Family history of premature coronary artery disease Tobacco use disorder which he is quit.  Significant Diagnostic Studies: Cardiac catheterization and angioplasty to proximal circumflex coronary artery in a staged fashion.  Consults:   Hospital Course: Patient is a 10-year-old African American male with history of known peripheral artery disease and known coronary artery disease. She did undergone coronary angiography on 07/01/2011 this had revealed high-grade stenosis of ostial and mid circumflex karate and is heavily calcified. He was brought back to the catheterization lab on an elective basis for rotational atherectomy followed by stenting. He did well and the following day he remained stable for discharge. He'll be discharged home on home medications plus Plavix for a period of 6-8 weeks at most as I have discussed with Dr. Bettina Gavia.  Discharge Exam: Blood pressure 188/87, pulse 62, temperature 98.6 F (37 C), temperature source Oral, resp. rate 19, height 5\' 8"  (1.727 m), weight 76.6 kg (168 lb 14 oz), SpO2 100.00%.   General appearance: alert, cooperative, appears stated age and no distress Resp: clear to auscultation bilaterally Cardio: S1, S2 normal and systolic murmur:  early systolic 2/6, crescendo and decrescendo at 2nd left intercostal space, at apex GI: soft, non-tender; bowel sounds normal; no masses,  no organomegaly Extremities: extremities normal, atraumatic, no cyanosis or edema and no ulcers, gangrene or trophic changes Right goin site without hematoma. Labs:   Lab Results  Component Value Date   WBC 5.4 07/23/2011   HGB 12.0* 07/23/2011   HCT 34.7* 07/23/2011   MCV 86.5 07/23/2011   PLT 123* 07/23/2011    Lab 07/23/11 0430  NA 136  K 3.8  CL 102  CO2 26  BUN 9  CREATININE 0.97  CALCIUM 8.7  PROT --  BILITOT --  ALKPHOS --  ALT --  AST --  GLUCOSE 86     EKG: Normal sinus rhythm, left hypertrophy with repolarization abnormality.  FOLLOW UP PLANS AND APPOINTMENTS  Medication List  As of 07/23/2011  7:58 AM   TAKE these medications         aspirin 81 MG tablet   Take 81 mg by mouth daily.      AZOR 10-40 MG per tablet   Generic drug: amLODipine-olmesartan   Take 1 tablet by mouth daily.      clopidogrel 75 MG tablet   Commonly known as: PLAVIX   Take 1 tablet (75 mg total) by mouth daily with breakfast.      metoprolol succinate 100 MG 24 hr tablet   Commonly known as: TOPROL-XL   Take 100 mg by mouth daily.      phenytoin 100 MG ER capsule   Commonly known as: DILANTIN   Take 100 mg by mouth 2 (two) times daily.      pravastatin 20 MG tablet   Commonly known as: PRAVACHOL   Take 20 mg by mouth  daily.      terazosin 5 MG capsule   Commonly known as: HYTRIN   Take 5 mg by mouth at bedtime.           Follow-up Information    Follow up with [name], MD in 2 weeks.   Contact information:   [address]  [place] [address] [phone]           [name], MD 07/23/2011, 7:58 AM

## 2011-07-23 NOTE — Progress Notes (Signed)
CARDIAC REHAB PHASE I   PRE:  Rate/Rhythm: 68 SR  BP:  Supine: 184/84  Sitting:   Standing:    SaO2:   MODE:  Ambulation: 680 ft   POST:  Rate/Rhythem: 78 Sr  BP:  Supine:   Sitting: 192/82  Standing:    SaO2:  1610-9604 Assisted X 1 to ambulate. Gait fairly steady, does get off balance when he turns head quickly. He states that this has been happening since his hematoma. Able to walk 680 ft without c/o of cp or SOB. BP elevated before and after walk. Completed stent discharge education. States that he has not smoked in the past week or two, encouraged him to continue cessation.  Beatrix Fetters

## 2011-07-23 NOTE — Consult Note (Signed)
Pt was a 1/2 ppd smoker but says he quit since 3 weeks ago with a Rx for wellbutrin. Congratulated and encouraged pt to remain tobacco free. Referred to 1-800 quit now for f/u and support. Discussed oral fixation substitutes, second hand smoke and in home smoking policy. Reviewed and gave pt Written education/contact information.

## 2011-07-23 NOTE — Plan of Care (Signed)
Problem: Discharge Progression Outcomes Goal: Ambulates up to 600 ft. in hall x 1 Outcome: Completed/Met Date Met:  07/23/11 With cardiac rehab Goal: Tolerating diet Outcome: Completed/Met Date Met:  07/23/11 Tolerated breakfast Goal: Vascular site scale level 0 - I Vascular Site Scale Level 0: No bruising/bleeding/hematoma Level I (Mild): Bruising/Ecchymosis, minimal bleeding/ooozing, palpable hematoma < 3 cm Level II (Moderate): Bleeding not affecting hemodynamic parameters, pseudoaneurysm, palpable hematoma > 3 cm  Outcome: Completed/Met Date Met:  07/23/11 Level 0

## 2011-08-14 ENCOUNTER — Encounter (HOSPITAL_COMMUNITY)
Admission: RE | Admit: 2011-08-14 | Discharge: 2011-08-14 | Disposition: A | Discharge status: Home / Self Care | Payer: Medicare Other | Source: Ambulatory Visit | Attending: Cardiology | Admitting: Cardiology

## 2011-08-14 NOTE — Progress Notes (Signed)
Cardiac Rehab Medication Review by a Pharmacist  Does the patient  feel that his/her medications are working for him/her?  yes  Has the patient been experiencing any side effects to the medications prescribed?  no  Does the patient measure his/her own blood pressure or blood glucose at home?  no   Does the patient have any problems obtaining medications due to transportation or finances?   no  Understanding of regimen: good Understanding of indications: good Potential of compliance: good    Pharmacist comments:     Ival Bible 08/14/2011 9:28 AM

## 2011-08-18 ENCOUNTER — Encounter (HOSPITAL_COMMUNITY)
Admission: RE | Admit: 2011-08-18 | Discharge: 2011-08-18 | Disposition: A | Discharge status: Home / Self Care | Payer: Medicare Other | Source: Ambulatory Visit | Attending: Cardiology | Admitting: Cardiology

## 2011-08-18 DIAGNOSIS — Z9861 Coronary angioplasty status: Secondary | ICD-10-CM | POA: Insufficient documentation

## 2011-08-18 DIAGNOSIS — Z5189 Encounter for other specified aftercare: Secondary | ICD-10-CM | POA: Insufficient documentation

## 2011-08-18 DIAGNOSIS — E785 Hyperlipidemia, unspecified: Secondary | ICD-10-CM | POA: Insufficient documentation

## 2011-08-18 DIAGNOSIS — I2510 Atherosclerotic heart disease of native coronary artery without angina pectoris: Secondary | ICD-10-CM | POA: Insufficient documentation

## 2011-08-18 DIAGNOSIS — I10 Essential (primary) hypertension: Secondary | ICD-10-CM | POA: Insufficient documentation

## 2011-08-18 DIAGNOSIS — I70219 Atherosclerosis of native arteries of extremities with intermittent claudication, unspecified extremity: Secondary | ICD-10-CM | POA: Insufficient documentation

## 2011-08-18 DIAGNOSIS — Z87891 Personal history of nicotine dependence: Secondary | ICD-10-CM | POA: Insufficient documentation

## 2011-08-19 NOTE — Progress Notes (Signed)
Andre Guzman came to cardiac rehab to exercise today. Telemetry sinus brady heart rate noted at 43 nonsustained. ECG tracing looked different than 12 lead from office and hospital.  Dr Jacinto Halim called and notified said  Andre Guzman is okay to exercise. Andre Guzman did not exercise yesterday. He plans to return Wednesday. Asked wife to accompany patient on next exercise session so medications can be reviewed.

## 2011-08-20 ENCOUNTER — Encounter (HOSPITAL_COMMUNITY)
Admission: RE | Admit: 2011-08-20 | Discharge: 2011-08-20 | Disposition: A | Discharge status: Home / Self Care | Payer: Medicare Other | Source: Ambulatory Visit | Attending: Cardiology | Admitting: Cardiology

## 2011-08-20 NOTE — Progress Notes (Signed)
Pt started cardiac rehab today.  Pt tolerated light exercise without difficulty.  Telemetry Sinus rhythm with ST depression  T wave inversion vital signs stable.  Monday's ECG tracings faxed to Dr Verl Dicker office yesterday from Monday.  Dr Jacinto Halim said patient is okay to proceed with exercise. Will continue to monitor the patient throughout  the program. Medications reconciled with wife.

## 2011-08-21 NOTE — Progress Notes (Signed)
Andre Guzman 66 y.o. male       Nutrition Screen                                                                    YES  NO Do you live in a nursing home?  X   Do you eat out more than 3 times/week?    X If yes, how many times per week do you eat out?  Do you have food allergies?    If yes, what are you allergic to?  Have you gained or lost more than 10 lbs without trying?               X If yes, how much weight have you lost and over what time period?  lbs gained or lost over  weeks/month  Do you want to lose weight?     X If yes, what is a goal weight or amount of weight you would like to lose?  lb  Do you eat alone most of the time?   X   Do you eat less than 2 meals/day?   If yes, how many meals do you eat?  Do you drink more than 3 alcohol drinks/day?  X If yes, how many drinks per day?  Are you having trouble with constipation? *  X If yes, what are you doing to help relieve constipation?  Do you have financial difficulties with buying food?*    X   Are you experiencing regular nausea/ vomiting?*        Do you have a poor appetite? *                                        X   Do you have trouble chewing/swallowing? *   X    Pt with diagnoses of:  X CABG              X Stent/ PTCA X Dyslipidemia  / HDL< 40 / LDL>70 / High TG      X %  Body fat >goal / Body Mass Index >25 X HTN / BP >120/80 X Pre-diabetes       Pt Risk Score   0       Diagnosis Risk Score  30       Total Risk Score   30                         High Risk               X Low Risk    HT: 68" Ht Readings from Last 1 Encounters:  08/14/11 5\' 8"  (1.727 m)    WT:   171.6 lb (78 kg) Wt Readings from Last 3 Encounters:  08/14/11 171 lb 15.3 oz (78 kg)  07/23/11 168 lb 14 oz (76.6 kg)  07/23/11 168 lb 14 oz (76.6 kg)     IBW 70 111%IBW BMI 26.1 26.5%body fat  Meds reviewed: Dilantin  Past Medical History  Diagnosis Date  . Coronary artery disease   . Shortness of breath   . Seizures   .  Hypertension     . Pneumonia        Activity level: Pt is sedentary  Wt goal: 172 lb ( 78 kg) due to tobacco cessation Current tobacco use? No     Pt quit tobacco use on 06/24/11 If pt using tobacco, is pt taking a vit C? No  Food/Drug Interaction? No Labs:  Lipid Panel  No results found for this basename: chol, trig, hdl, cholhdl, vldl, ldlcalc  Per MD note: 02/03/11 lipids Total Cholesterol 183 TG 65 HDL 75 LDL 95 A1c 5.7 No results found for this basename: HGBA1C  07/23/11 Glucose 86  LDL goal: < 100      MI, DM, Carotid or PVD and > 2:      HTN, family h/o, > 67 yo male Estimated Daily Nutrition Needs for: ? wt maintenance due to recent tobacco cessation 2150-2450 Kcal , Total Fat 70-80gm, Saturated Fat 16-19 gm, Trans Fat 2.4-2.7 gm,  Sodium less than 1500 mg  Andre Guzman 66 y.o. male 1426 09/08/11 Nutrition Note Spoke with pt.  Nutrition Survey reviewed with pt. Pt is not the Therapeutic Lifestyle Changes diet. Pt does report he has made some dietary changes. Pt is eating fried food once a week instead of 3-4 times a week.  Nutrition Diagnosis   Food-and nutrition-related knowledge deficit related to lack of exposure to information as related to diagnosis of: ? CVD ? Pre-DM (A1c 5.7)  Nutrition RX/ Estimated Daily Nutrition Needs for: wt maintenance due to tobacco cessation 2150-2450 Kcal, 70-80 gm fat, 16-19 gm sat fat, 2.4-2.7 gm trans-fat, <1500 mg sodium  Nutrition Intervention   Benefits of adopting Therapeutic Lifestyle Changes discussed when Medficts reviewed.   Pt to attend the Portion Distortion class   Pt to attend the  ? Nutrition I class                     ? Nutrition II class    Continue client-centered nutrition education by RD, as part of interdisciplinary care. Goal(s)   Pt to identify and limit food sources of saturated fat, trans fat, and cholesterol   Pt to describe the benefit of including fruits, vegetables, whole grains, and low-fat dairy products in a heart  healthy meal plan. Monitor and Evaluate progress toward nutrition goal with team.   Marily Memos B. Blake Divine, M.Ed, RD, LDN, CDE 09/08/11 1431

## 2011-08-22 ENCOUNTER — Encounter (HOSPITAL_COMMUNITY): Payer: Medicare Other

## 2011-08-25 ENCOUNTER — Encounter (HOSPITAL_COMMUNITY)
Admission: RE | Admit: 2011-08-25 | Discharge: 2011-08-25 | Disposition: A | Discharge status: Home / Self Care | Payer: Medicare Other | Source: Ambulatory Visit | Attending: Cardiology | Admitting: Cardiology

## 2011-08-25 NOTE — Progress Notes (Signed)
Pt noted to have new pac's today with exercise.  Pt asymptomatic with no complaints.  EKG strips faxed to Md - Dr. Jacinto Halim for review.

## 2011-08-27 ENCOUNTER — Encounter (HOSPITAL_COMMUNITY)
Admission: RE | Admit: 2011-08-27 | Discharge: 2011-08-27 | Disposition: A | Discharge status: Home / Self Care | Payer: Medicare Other | Source: Ambulatory Visit | Attending: Cardiology | Admitting: Cardiology

## 2011-08-29 ENCOUNTER — Encounter (HOSPITAL_COMMUNITY): Payer: Medicare Other

## 2011-09-01 ENCOUNTER — Encounter (HOSPITAL_COMMUNITY)
Admission: RE | Admit: 2011-09-01 | Discharge: 2011-09-01 | Disposition: A | Discharge status: Home / Self Care | Payer: Medicare Other | Source: Ambulatory Visit | Attending: Cardiology | Admitting: Cardiology

## 2011-09-01 NOTE — Progress Notes (Signed)
Pt off to the side discussing home exercise, Pt sitting and heart rate noted to be in the low 40's.  Dr. Jacinto Halim is in the office, strips faxed over for review.  Pt asymptomatic with no complaints.

## 2011-09-01 NOTE — Progress Notes (Signed)
Reviewed home exercise with pt today.  Pt plans to walk for exercise.  Reviewed THR, pulse (needs practice), RPE, sign and symptoms, and when to call 911 or MD.  Pt voiced understanding.  Also reviewed QOL questionnaire with pt.  Rephrased questions to help him understand better.  Will be rescored and written on treatment plan. Fabio Pierce, MA, ACSM RCEP

## 2011-09-03 ENCOUNTER — Encounter (HOSPITAL_COMMUNITY)
Admission: RE | Admit: 2011-09-03 | Discharge: 2011-09-03 | Disposition: A | Discharge status: Home / Self Care | Payer: Medicare Other | Source: Ambulatory Visit | Attending: Cardiology | Admitting: Cardiology

## 2011-09-05 ENCOUNTER — Encounter (HOSPITAL_COMMUNITY): Payer: Medicare Other

## 2011-09-08 ENCOUNTER — Encounter (HOSPITAL_COMMUNITY)
Admission: RE | Admit: 2011-09-08 | Discharge: 2011-09-08 | Disposition: A | Discharge status: Home / Self Care | Payer: Medicare Other | Source: Ambulatory Visit | Attending: Cardiology | Admitting: Cardiology

## 2011-09-10 ENCOUNTER — Encounter (HOSPITAL_COMMUNITY)
Admission: RE | Admit: 2011-09-10 | Discharge: 2011-09-10 | Disposition: A | Discharge status: Home / Self Care | Payer: Medicare Other | Source: Ambulatory Visit | Attending: Cardiology | Admitting: Cardiology

## 2011-09-12 ENCOUNTER — Encounter (HOSPITAL_COMMUNITY): Payer: Medicare Other

## 2011-09-15 ENCOUNTER — Encounter (HOSPITAL_COMMUNITY)
Admission: RE | Admit: 2011-09-15 | Discharge: 2011-09-15 | Disposition: A | Discharge status: Home / Self Care | Payer: Medicare Other | Source: Ambulatory Visit | Attending: Cardiology | Admitting: Cardiology

## 2011-09-15 NOTE — Progress Notes (Signed)
Andre Guzman 66 y.o. male Nutrition Note  Spoke with pt.  Nutrition Plan and Nutrition Survey reviewed with pt. Pt is not following the Therapeutic Lifestyle Changes diet. Given there are several areas for improvement in pt diet, pt feels he can decrease the amount of whole eggs and poultry skin consumed. Pt consumes only 1 serving of fruit and veggies daily. Pt encouraged to increase fruit and vegetable consumption to a minimum of 5 servings daily. Per discussion with pt, pt is aware of pre-diabetes dx. Pt continues to smoke "maybe 1 cigarette a week." Pt states he feels the medication he is taking is helping him not want cigarettes.  Pt encouraged to continue working toward complete tobacco cessation.  Nutrition Diagnosis   Food-and nutrition-related knowledge deficit related to lack of exposure to information as related to diagnosis of: ? CVD ? Pre-DM (A1c 5.7)   Nutrition RX/ Estimated Daily Nutrition Needs for: wt maintenance 2150-2450 Kcal, 70-80 gm fat, 16-19 gm sat fat, 2.4-2.7 gm trans-fat, <1500 mg sodium  Nutrition Intervention   Pt's individual nutrition plan including cholesterol goals reviewed with pt.   Benefits of adopting Therapeutic Lifestyle Changes discussed when Medficts reviewed.   Pt to attend the Portion Distortion class    Pt to attend the  ? Nutrition I class                     ? Nutrition II class    Pt given handouts for: ? pre-DM ? tobacco cessation   Continue client-centered nutrition education by RD, as part of interdisciplinary care. Goal(s)   Pt to identify and limit food sources of saturated fat, trans fat, and cholesterol   Pt to describe the benefit of including fruits, vegetables, whole grains, and low-fat dairy products in a heart healthy meal plan. Monitor and Evaluate progress toward nutrition goal with team.

## 2011-09-17 ENCOUNTER — Encounter (HOSPITAL_COMMUNITY)
Admission: RE | Admit: 2011-09-17 | Discharge: 2011-09-17 | Disposition: A | Discharge status: Home / Self Care | Payer: Medicare Other | Source: Ambulatory Visit | Attending: Cardiology | Admitting: Cardiology

## 2011-09-17 DIAGNOSIS — Z87891 Personal history of nicotine dependence: Secondary | ICD-10-CM | POA: Insufficient documentation

## 2011-09-17 DIAGNOSIS — I2510 Atherosclerotic heart disease of native coronary artery without angina pectoris: Secondary | ICD-10-CM | POA: Insufficient documentation

## 2011-09-17 DIAGNOSIS — Z9861 Coronary angioplasty status: Secondary | ICD-10-CM | POA: Insufficient documentation

## 2011-09-17 DIAGNOSIS — E785 Hyperlipidemia, unspecified: Secondary | ICD-10-CM | POA: Insufficient documentation

## 2011-09-17 DIAGNOSIS — I70219 Atherosclerosis of native arteries of extremities with intermittent claudication, unspecified extremity: Secondary | ICD-10-CM | POA: Insufficient documentation

## 2011-09-17 DIAGNOSIS — Z5189 Encounter for other specified aftercare: Secondary | ICD-10-CM | POA: Insufficient documentation

## 2011-09-17 DIAGNOSIS — I10 Essential (primary) hypertension: Secondary | ICD-10-CM | POA: Insufficient documentation

## 2011-09-19 ENCOUNTER — Encounter (HOSPITAL_COMMUNITY): Payer: Medicare Other

## 2011-09-22 ENCOUNTER — Encounter (HOSPITAL_COMMUNITY)
Admission: RE | Admit: 2011-09-22 | Discharge: 2011-09-22 | Disposition: A | Discharge status: Home / Self Care | Payer: Medicare Other | Source: Ambulatory Visit | Attending: Cardiology | Admitting: Cardiology

## 2011-09-22 NOTE — Progress Notes (Signed)
Pt with higher than usual BP for pre exercise.  PT reports that he is out of one of his medications.  Pt plans to pick it up after he leaves exercise.

## 2011-09-24 ENCOUNTER — Encounter (HOSPITAL_COMMUNITY)
Admission: RE | Admit: 2011-09-24 | Discharge: 2011-09-24 | Disposition: A | Discharge status: Home / Self Care | Payer: Medicare Other | Source: Ambulatory Visit | Attending: Cardiology | Admitting: Cardiology

## 2011-09-26 ENCOUNTER — Encounter (HOSPITAL_COMMUNITY): Payer: Medicare Other

## 2011-09-29 ENCOUNTER — Encounter (HOSPITAL_COMMUNITY)
Admission: RE | Admit: 2011-09-29 | Discharge: 2011-09-29 | Disposition: A | Discharge status: Home / Self Care | Payer: Medicare Other | Source: Ambulatory Visit | Attending: Cardiology | Admitting: Cardiology

## 2011-10-01 ENCOUNTER — Encounter (HOSPITAL_COMMUNITY)
Admission: RE | Admit: 2011-10-01 | Discharge: 2011-10-01 | Disposition: A | Discharge status: Home / Self Care | Payer: Medicare Other | Source: Ambulatory Visit | Attending: Cardiology | Admitting: Cardiology

## 2011-10-03 ENCOUNTER — Encounter (HOSPITAL_COMMUNITY): Payer: Medicare Other

## 2011-10-06 ENCOUNTER — Encounter (HOSPITAL_COMMUNITY)
Admission: RE | Admit: 2011-10-06 | Discharge: 2011-10-06 | Disposition: A | Discharge status: Home / Self Care | Payer: Medicare Other | Source: Ambulatory Visit | Attending: Cardiology | Admitting: Cardiology

## 2011-10-08 ENCOUNTER — Encounter (HOSPITAL_COMMUNITY)
Admission: RE | Admit: 2011-10-08 | Discharge: 2011-10-08 | Disposition: A | Discharge status: Home / Self Care | Payer: Medicare Other | Source: Ambulatory Visit | Attending: Cardiology | Admitting: Cardiology

## 2011-10-10 ENCOUNTER — Encounter (HOSPITAL_COMMUNITY): Payer: Medicare Other

## 2011-10-13 ENCOUNTER — Encounter (HOSPITAL_COMMUNITY): Payer: Medicare Other

## 2011-10-15 ENCOUNTER — Encounter (HOSPITAL_COMMUNITY)
Admission: RE | Admit: 2011-10-15 | Discharge: 2011-10-15 | Disposition: A | Discharge status: Home / Self Care | Payer: Medicare Other | Source: Ambulatory Visit | Attending: Cardiology | Admitting: Cardiology

## 2011-10-17 ENCOUNTER — Encounter (HOSPITAL_COMMUNITY): Payer: Medicare Other

## 2011-10-20 ENCOUNTER — Encounter (HOSPITAL_COMMUNITY)
Admission: RE | Admit: 2011-10-20 | Discharge: 2011-10-20 | Disposition: A | Discharge status: Home / Self Care | Payer: Medicare Other | Source: Ambulatory Visit | Attending: Cardiology | Admitting: Cardiology

## 2011-10-20 DIAGNOSIS — Z5189 Encounter for other specified aftercare: Secondary | ICD-10-CM | POA: Insufficient documentation

## 2011-10-20 DIAGNOSIS — Z9861 Coronary angioplasty status: Secondary | ICD-10-CM | POA: Insufficient documentation

## 2011-10-20 DIAGNOSIS — I10 Essential (primary) hypertension: Secondary | ICD-10-CM | POA: Insufficient documentation

## 2011-10-20 DIAGNOSIS — I2510 Atherosclerotic heart disease of native coronary artery without angina pectoris: Secondary | ICD-10-CM | POA: Insufficient documentation

## 2011-10-20 DIAGNOSIS — I70219 Atherosclerosis of native arteries of extremities with intermittent claudication, unspecified extremity: Secondary | ICD-10-CM | POA: Insufficient documentation

## 2011-10-20 DIAGNOSIS — E785 Hyperlipidemia, unspecified: Secondary | ICD-10-CM | POA: Insufficient documentation

## 2011-10-20 DIAGNOSIS — Z87891 Personal history of nicotine dependence: Secondary | ICD-10-CM | POA: Insufficient documentation

## 2011-10-22 ENCOUNTER — Encounter (HOSPITAL_COMMUNITY)
Admission: RE | Admit: 2011-10-22 | Discharge: 2011-10-22 | Disposition: A | Discharge status: Home / Self Care | Payer: Medicare Other | Source: Ambulatory Visit | Attending: Cardiology | Admitting: Cardiology

## 2011-10-24 ENCOUNTER — Encounter (HOSPITAL_COMMUNITY): Payer: Medicare Other

## 2011-10-26 ENCOUNTER — Emergency Department (HOSPITAL_COMMUNITY)
Admission: EM | Admit: 2011-10-26 | Discharge: 2011-10-26 | Disposition: A | Discharge status: Home / Self Care | Payer: Medicare Other | Attending: Emergency Medicine | Admitting: Emergency Medicine

## 2011-10-26 ENCOUNTER — Encounter (HOSPITAL_COMMUNITY): Payer: Self-pay | Admitting: *Deleted

## 2011-10-26 ENCOUNTER — Emergency Department (HOSPITAL_COMMUNITY): Payer: Medicare Other

## 2011-10-26 DIAGNOSIS — I10 Essential (primary) hypertension: Secondary | ICD-10-CM | POA: Insufficient documentation

## 2011-10-26 DIAGNOSIS — I209 Angina pectoris, unspecified: Secondary | ICD-10-CM | POA: Insufficient documentation

## 2011-10-26 DIAGNOSIS — I2510 Atherosclerotic heart disease of native coronary artery without angina pectoris: Secondary | ICD-10-CM | POA: Insufficient documentation

## 2011-10-26 DIAGNOSIS — R079 Chest pain, unspecified: Secondary | ICD-10-CM | POA: Insufficient documentation

## 2011-10-26 DIAGNOSIS — Z79899 Other long term (current) drug therapy: Secondary | ICD-10-CM | POA: Insufficient documentation

## 2011-10-26 DIAGNOSIS — I208 Other forms of angina pectoris: Secondary | ICD-10-CM

## 2011-10-26 LAB — BASIC METABOLIC PANEL
BUN: 16 mg/dL (ref 6–23)
Chloride: 97 mEq/L (ref 96–112)
Creatinine, Ser: 0.95 mg/dL (ref 0.50–1.35)
Glucose, Bld: 105 mg/dL — ABNORMAL HIGH (ref 70–99)
Potassium: 4.1 mEq/L (ref 3.5–5.1)

## 2011-10-26 LAB — CBC
HCT: 35.7 % — ABNORMAL LOW (ref 39.0–52.0)
Hemoglobin: 12.5 g/dL — ABNORMAL LOW (ref 13.0–17.0)
MCV: 86.2 fL (ref 78.0–100.0)
RDW: 14.4 % (ref 11.5–15.5)
WBC: 8 10*3/uL (ref 4.0–10.5)

## 2011-10-26 LAB — DIFFERENTIAL
Basophils Absolute: 0 10*3/uL (ref 0.0–0.1)
Lymphocytes Relative: 19 % (ref 12–46)
Lymphs Abs: 1.5 10*3/uL (ref 0.7–4.0)
Monocytes Absolute: 0.6 10*3/uL (ref 0.1–1.0)
Monocytes Relative: 7 % (ref 3–12)
Neutro Abs: 5.9 10*3/uL (ref 1.7–7.7)

## 2011-10-26 MED ORDER — AMLODIPINE BESYLATE 10 MG PO TABS
10.0000 mg | ORAL_TABLET | Freq: Once | ORAL | Status: AC
  Administered 2011-10-26: 10 mg via ORAL
  Filled 2011-10-26: qty 1

## 2011-10-26 MED ORDER — METOPROLOL SUCCINATE ER 50 MG PO TB24
50.0000 mg | ORAL_TABLET | Freq: Every day | ORAL | Status: DC
  Administered 2011-10-26: 50 mg via ORAL
  Filled 2011-10-26: qty 1

## 2011-10-26 MED ORDER — ASPIRIN 325 MG PO TABS
ORAL_TABLET | ORAL | Status: AC
  Filled 2011-10-26: qty 1

## 2011-10-26 MED ORDER — ASPIRIN EC 325 MG PO TBEC
325.0000 mg | DELAYED_RELEASE_TABLET | Freq: Once | ORAL | Status: AC
  Administered 2011-10-26: 325 mg via ORAL
  Filled 2011-10-26: qty 1

## 2011-10-26 NOTE — ED Provider Notes (Addendum)
History     CSN: [id number]  Arrival date & time 10/26/11  4098   First MD Initiated Contact with Patient 10/26/11 [phone]      Chief Complaint  Patient presents with  . Chest Pain    (Consider location/radiation/quality/duration/timing/severity/associated sxs/prior treatment) HPI Comments: Patient presents with substernal chest pain that he states began on Friday.  Apparently on Friday he went to a graduation semi-and had to walk a longer distance for him.  He noted chest pain and shortness of breath with that he also had some associated abdominal pain.  When he rests the symptoms improve.  Patient does have known coronary disease with a stenting procedure completed in March.  Patient is still undergoing cardiac rehabilitation as well.  Over the course of the weekend the patient has noted some worsening chest pain with exertion which improves with rest.  No associated nausea or vomiting.  No fevers or cough.  Patient currently has no chest pain or shortness of breath or abdominal pain.  He's noted some mild constipation but no other GI symptoms.  No urinary symptoms.  Patient is a 66 y.o. male presenting with chest pain. The history is provided by the patient. No language interpreter was used.  Chest Pain The chest pain began 2 days ago. Primary symptoms include shortness of breath and abdominal pain. Pertinent negatives for primary symptoms include no fever, no cough, no nausea and no vomiting.     Past Medical History  Diagnosis Date  . Coronary artery disease   . Shortness of breath   . Seizures   . Hypertension   . Pneumonia     Past Surgical History  Procedure Date  . Cardiac catheterization   . Coronary angioplasty with stent placement 07/22/2011  . Cardiac valve surgery     History reviewed. No pertinent family history.  History  Substance Use Topics  . Smoking status: Former Smoker    Types: Cigarettes    Quit date: 06/24/2011  . Smokeless tobacco: Never Used  .  Alcohol Use: Yes     social      Review of Systems  Constitutional: Negative.  Negative for fever and chills.  HENT: Negative.   Eyes: Negative.   Respiratory: Positive for shortness of breath. Negative for cough.   Cardiovascular: Positive for chest pain.  Gastrointestinal: Positive for abdominal pain and constipation. Negative for nausea and vomiting.  Genitourinary: Negative.  Negative for hematuria.  Musculoskeletal: Negative.  Negative for back pain.  Skin: Negative.  Negative for color change and rash.  Neurological: Negative for syncope and headaches.  Hematological: Negative.  Negative for adenopathy.  Psychiatric/Behavioral: Negative.  Negative for confusion.  All other systems reviewed and are negative.    Allergies  Review of patient's allergies indicates no known allergies.  Home Medications   Current Outpatient Rx  Name Route Sig Dispense Refill  . AMLODIPINE-OLMESARTAN 10-40 MG PO TABS Oral Take 1 tablet by mouth daily.    . ASPIRIN 81 MG PO TABS Oral Take 81 mg by mouth daily.    Marland Kitchen CLOPIDOGREL BISULFATE 75 MG PO TABS Oral Take 1 tablet (75 mg total) by mouth daily with breakfast. 30 tablet 1  . METOPROLOL SUCCINATE ER 100 MG PO TB24 Oral Take 50 mg by mouth daily.     Marland Kitchen PHENYTOIN SODIUM EXTENDED 100 MG PO CAPS Oral Take 100 mg by mouth 2 (two) times daily.    Marland Kitchen PRAVASTATIN SODIUM 20 MG PO TABS Oral Take 20 mg by  mouth daily.    Marland Kitchen TERAZOSIN HCL 5 MG PO CAPS Oral Take 5 mg by mouth at bedtime.      BP 181/81  Pulse 110  Temp(Src) 98.4 F (36.9 C) (Oral)  Resp 18  SpO2 100%  Physical Exam  Nursing note and vitals reviewed. Constitutional: He is oriented to person, place, and time. He appears well-developed and well-nourished.  Non-toxic appearance. He does not have a sickly appearance.  HENT:  Head: Normocephalic and atraumatic.  Eyes: Conjunctivae, EOM and lids are normal. Pupils are equal, round, and reactive to light.  Neck: Trachea normal, normal  range of motion and full passive range of motion without pain. Neck supple.  Cardiovascular: Normal rate, regular rhythm and normal heart sounds.  Exam reveals no gallop.   No murmur heard. Pulmonary/Chest: Effort normal and breath sounds normal. No respiratory distress. He has no wheezes.  Abdominal: Soft. Normal appearance. He exhibits no distension. There is no tenderness. There is no rebound and no CVA tenderness.  Musculoskeletal: Normal range of motion.  Neurological: He is alert and oriented to person, place, and time. He has normal strength.  Skin: Skin is warm, dry and intact. No rash noted.  Psychiatric: He has a normal mood and affect. His behavior is normal. Judgment and thought content normal.    ED Course  Procedures (including critical care time)  Results for orders placed during the hospital encounter of 10/26/11  CBC      Component Value Range   WBC 8.0  4.0 - 10.5 (K/uL)   RBC 4.14 (*) 4.22 - 5.81 (MIL/uL)   Hemoglobin 12.5 (*) 13.0 - 17.0 (g/dL)   HCT 16.1 (*) 09.6 - 52.0 (%)   MCV 86.2  78.0 - 100.0 (fL)   MCH 30.2  26.0 - 34.0 (pg)   MCHC 35.0  30.0 - 36.0 (g/dL)   RDW 04.5  40.9 - 81.1 (%)   Platelets 154  150 - 400 (K/uL)  BASIC METABOLIC PANEL      Component Value Range   Sodium 134 (*) 135 - 145 (mEq/L)   Potassium 4.1  3.5 - 5.1 (mEq/L)   Chloride 97  96 - 112 (mEq/L)   CO2 28  19 - 32 (mEq/L)   Glucose, Bld 105 (*) 70 - 99 (mg/dL)   BUN 16  6 - 23 (mg/dL)   Creatinine, Ser 9.14  0.50 - 1.35 (mg/dL)   Calcium 8.9  8.4 - 78.2 (mg/dL)   GFR calc non Af Amer 84 (*) >90 (mL/min)   GFR calc Af Amer >90  >90 (mL/min)  TROPONIN I      Component Value Range   Troponin I <0.30  <0.30 (ng/mL)  DIFFERENTIAL      Component Value Range   Neutrophils Relative 74  43 - 77 (%)   Neutro Abs 5.9  1.7 - 7.7 (K/uL)   Lymphocytes Relative 19  12 - 46 (%)   Lymphs Abs 1.5  0.7 - 4.0 (K/uL)   Monocytes Relative 7  3 - 12 (%)   Monocytes Absolute 0.6  0.1 - 1.0  (K/uL)   Eosinophils Relative 0  0 - 5 (%)   Eosinophils Absolute 0.0  0.0 - 0.7 (K/uL)   Basophils Relative 0  0 - 1 (%)   Basophils Absolute 0.0  0.0 - 0.1 (K/uL)  TROPONIN I      Component Value Range   Troponin I <0.30  <0.30 (ng/mL)   Dg Chest 2 View  10/26/2011  *RADIOLOGY REPORT*  Clinical Data: Chest pain and shortness of breath  CHEST - 2 VIEW  Comparison: 05/04/2011and 06/20/2009  Findings: There are changes of median sternotomy.  Heart size is stable within normal limits.  Pulmonary vascularity is prominent. There is diffuse interstitial prominence bilaterally with Kerley B lines.  Trace bilateral pleural effusions are suspected in the costophrenic angles.  Trachea midline.  No acute bony abnormality. A metallic BB projects over the superficial anterior right upper chest, unchanged from prior exams.  IMPRESSION: Pulmonary vascular congestion and interstitial pulmonary edema. Suspect trace bilateral pleural effusions.  Original Report Authenticated By: Britta Mccreedy, M.D.      Date: 10/26/2011  Rate: 60  Rhythm: normal sinus rhythm  QRS Axis: normal  Intervals: normal  ST/T Wave abnormalities: Biphasic T waves in lateral leads  Conduction Disutrbances:none  Narrative Interpretation:   Old EKG Reviewed: unchanged from 07-23-11    MDM  Patient with symptoms concerning for possible anginal symptoms.  He is chest pain-free at this time so does not require further nitroglycerin.  Patient has not taken his morning medications yet which likely explains his elevated blood pressure.  I will give him a dose of his morning blood pressure medications here today.  This will include the metoprolol and amlodipine.  The olmesartan is nonformulary so we do not have it here.  I will contact the patient's cardiologist once we had the initial laboratory studies back as the patient has been describing angina and may require admission or close followup in the next few days.        Nat Christen, MD 10/26/11 [phone]  Spoke with Dr. Jacinto Halim and we will obtain a second set of cardiac markers and he will follow up with the pt on Monday with plan for possible cath on Tuesday due to anginal nature of his symptoms.    Nat Christen, MD 10/26/11 0909  11:14 AM Repeat Trop is neg.  Pt to go home with f/u tomorrow with Dr. Jacinto Halim.    Nat Christen, MD 10/26/11 1115

## 2011-10-26 NOTE — Discharge Instructions (Signed)
If you have worsening chest pain or shortness of breath, please return to the emergency dept.   Do not do any further exercise until you see Dr. Jacinto Halim in the office tomorrow.    Angina Angina is discomfort caused by inadequate oxygen delivery to the heart muscle. Angina is a response to blockage or narrowing of the coronary arteries. It alerts you about a blood flow problem to your heart. New, more frequent, or severe angina is a warning signal for you to seek medical care right away.  CAUSES The coronary arteries supply blood and oxygen to the heart muscle. The heart muscle needs a constant supply of oxygen in order to pump blood to the body. These arteries often become blocked by hardened deposits of blood products (plaque) including fat and cholesterol. The following contribute to the formation of plaque:  High cholesterol.   High blood pressure.   Smoking.   Obesity.   Diabetes.  SYMPTOMS   The most common problem is a deep discomfort in the center of your chest. The discomfort may also be felt in, or move to your:   Arms (especially the left one).   Throat.   Jaw.   Back.   Upper stomach.   Angina may be brought on by exercise, emotional upset, heavy meals, or extremes of heat or cold.   It may resolve within 5 to 10 minutes after a period of rest.   Angina symptoms vary from person to person.   If you have angina and it is treated, it may resolve. If you feel it again, the symptoms may not be the same in both type and location.  DIAGNOSIS   Emergency room evaluation or hospital admission may be needed to determine if there are any blockages of your coronary arteries.   Blood tests, EKGs, and chest X-rays may be done.   Further testing may include a stress test or an angiogram.   A heart specialist (cardiologist) may be asked to assist with your evaluation.   Other conditions that may feel like angina, but are not a problem with the heart include:   Muscle strain  in the chest wall.   Blood clots in the lung.   Anxiety.   Acid reflux from the stomach.  RISKS AND COMPLICATIONS  Angina that is not treated or evaluated can lead to a decline in oxygen delivery to the heart muscle, a heart attack, or even death. TREATMENT  Depending on severity of the blockages and on other factors, angina may be treated with:  Lifestyle changes such as weight loss, stopping smoking, appropriate exercise, or low cholesterol and low salt diet.   Medications to control or to treat the risk factors for angina.   Procedures such as angioplasty or stent placement may allow the blockages to be opened without surgery.   Open heart surgery may be needed to bypass blocked arteries that cannot be treated by other methods.  HOME CARE INSTRUCTIONS   If your caregiver prescribed medication to control your angina, take them as directed. Report side effects. Do not stop medications or adjust the dosages on your own.   Regular exercise is good for you as long as it does not cause discomfort. Avoid activities that trigger attacks of angina. Walking is the best exercise. Do not begin any new type of exercise until you check with your caregiver.   You may still have a sexual relationship if it does not cause angina. Tell your caregiver if it does.  Stop smoking. Do not use nicotine patches or gum until you check with your caregiver.   If you are overweight, you should lose weight. Eat a heart healthy diet that is low in fat and salt.   If your caregiver has given you a follow-up appointment, it is very important to keep that appointment. Not keeping the appointment could result in a heart attack, permanent heart damage, and disability. If there is any problem keeping the appointment, you must call back to this facility for assistance.  SEEK MEDICAL CARE IF:   Your angina seems to be occurring more frequently or seems to be lasting longer.   You are having problems that you think  may be side effects of the medicine you are taking.  SEEK IMMEDIATE MEDICAL CARE IF:   You have severe chest discomfort, especially if the pain is crushing or pressure-like and spreads to the arms, back, neck, or jaw.   You are sweating, feel sick to your stomach (nauseous), or have shortness of breath.   You have an attack of angina that does not get better after rest or taking your usual medicine.   You wake from sleep with chest pain.   You feel dizzy, faint, or experience extreme fatigue.   You have chest pain not typical of your usual angina. THIS IS AN EMERGENCY. Do not wait to see if the pain will go away. Get medical help at once. Call 911. DO NOT drive yourself to the hospital.  MAKE SURE YOU:   Understand these instructions.   Will watch your condition.   Will get help right away if you are not doing well or get worse.  Document Released: 05/05/2005 Document Revised: 04/24/2011 Document Reviewed: 12/07/2007 [facility] Patient Information 2012 [place], [place].

## 2011-10-26 NOTE — ED Notes (Signed)
Patient is alert and oriented x3.  He is complaining of mid sternal chest pain that started Friday. He states that is has increased in pain since Friday.  Currently he states that he is pain free.   He describes his pain like a fluttering of his heart

## 2011-10-27 ENCOUNTER — Encounter (HOSPITAL_COMMUNITY)
Admission: RE | Admit: 2011-10-27 | Discharge: 2011-10-27 | Payer: Medicare Other | Source: Ambulatory Visit | Attending: Cardiology | Admitting: Cardiology

## 2011-10-27 ENCOUNTER — Telehealth (HOSPITAL_COMMUNITY): Payer: Self-pay | Admitting: *Deleted

## 2011-10-27 NOTE — Progress Notes (Signed)
Pt is being seen by Dr. Jacinto Halim due to complaint of shortness of breath.  Rehab report sent for MD review.

## 2011-10-27 NOTE — Telephone Encounter (Signed)
Called out for today.  Pt complained of shortness of breath and planned to see Dr. Jacinto Halim in the office today.

## 2011-10-29 ENCOUNTER — Encounter (HOSPITAL_COMMUNITY): Payer: Medicare Other

## 2011-10-30 ENCOUNTER — Encounter (HOSPITAL_COMMUNITY)
Admission: EM | Disposition: A | Discharge status: Home / Self Care | Payer: Self-pay | Source: Home / Self Care | Attending: Cardiology

## 2011-10-30 ENCOUNTER — Inpatient Hospital Stay (HOSPITAL_COMMUNITY)
Admission: EM | Admit: 2011-10-30 | Discharge: 2011-10-31 | DRG: 251 | Disposition: A | Discharge status: Home / Self Care | Payer: Medicare Other | Attending: Cardiology | Admitting: Cardiology

## 2011-10-30 ENCOUNTER — Telehealth (HOSPITAL_COMMUNITY): Payer: Self-pay | Admitting: *Deleted

## 2011-10-30 ENCOUNTER — Encounter (HOSPITAL_COMMUNITY): Payer: Self-pay | Admitting: Emergency Medicine

## 2011-10-30 ENCOUNTER — Emergency Department (HOSPITAL_COMMUNITY): Payer: Medicare Other

## 2011-10-30 DIAGNOSIS — I2510 Atherosclerotic heart disease of native coronary artery without angina pectoris: Principal | ICD-10-CM | POA: Diagnosis present

## 2011-10-30 DIAGNOSIS — I509 Heart failure, unspecified: Secondary | ICD-10-CM

## 2011-10-30 DIAGNOSIS — F17200 Nicotine dependence, unspecified, uncomplicated: Secondary | ICD-10-CM | POA: Diagnosis present

## 2011-10-30 DIAGNOSIS — Z9861 Coronary angioplasty status: Secondary | ICD-10-CM

## 2011-10-30 DIAGNOSIS — I10 Essential (primary) hypertension: Secondary | ICD-10-CM | POA: Diagnosis present

## 2011-10-30 DIAGNOSIS — I501 Left ventricular failure: Secondary | ICD-10-CM | POA: Diagnosis present

## 2011-10-30 DIAGNOSIS — E785 Hyperlipidemia, unspecified: Secondary | ICD-10-CM | POA: Diagnosis present

## 2011-10-30 DIAGNOSIS — I739 Peripheral vascular disease, unspecified: Secondary | ICD-10-CM | POA: Diagnosis present

## 2011-10-30 HISTORY — PX: LEFT HEART CATHETERIZATION WITH CORONARY ANGIOGRAM: SHX5451

## 2011-10-30 LAB — CARDIAC PANEL(CRET KIN+CKTOT+MB+TROPI)
CK, MB: 4.1 ng/mL — ABNORMAL HIGH (ref 0.3–4.0)
CK, MB: 4 ng/mL (ref 0.3–4.0)
Relative Index: 2.1 (ref 0.0–2.5)
Total CK: 200 U/L (ref 7–232)
Troponin I: <0.3 ng/mL (ref <0.30)
Troponin I: <0.3 ng/mL (ref <0.30)

## 2011-10-30 LAB — DIFFERENTIAL
Eosinophils Absolute: 0.1 10*3/uL (ref 0.0–0.7)
Lymphs Abs: 2.2 10*3/uL (ref 0.7–4.0)
Monocytes Relative: 6 % (ref 3–12)
Neutrophils Relative %: 65 % (ref 43–77)

## 2011-10-30 LAB — POCT I-STAT, CHEM 8
Chloride: 104 mEq/L (ref 96–112)
Glucose, Bld: 113 mg/dL — ABNORMAL HIGH (ref 70–99)
HCT: 38 % — ABNORMAL LOW (ref 39.0–52.0)
Potassium: 4 mEq/L (ref 3.5–5.1)

## 2011-10-30 LAB — LIPID PANEL
Cholesterol: 150 mg/dL (ref 0–200)
LDL Cholesterol: 75 mg/dL (ref 0–99)
Total CHOL/HDL Ratio: 2.3 RATIO
VLDL: 11 mg/dL (ref 0–40)

## 2011-10-30 LAB — CBC
HCT: 34.2 % — ABNORMAL LOW (ref 39.0–52.0)
Hemoglobin: 12.1 g/dL — ABNORMAL LOW (ref 13.0–17.0)
MCH: 30.1 pg (ref 26.0–34.0)
RBC: 4.02 MIL/uL — ABNORMAL LOW (ref 4.22–5.81)

## 2011-10-30 LAB — POCT I-STAT TROPONIN I: Troponin i, poc: 0.04 ng/mL (ref 0.00–0.08)

## 2011-10-30 LAB — APTT: aPTT: 34 seconds (ref 24–37)

## 2011-10-30 SURGERY — LEFT HEART CATHETERIZATION WITH CORONARY ANGIOGRAM
Anesthesia: LOCAL

## 2011-10-30 MED ORDER — ASPIRIN 81 MG PO CHEW: 324.0000 mg | CHEWABLE_TABLET | ORAL | Status: DC

## 2011-10-30 MED ORDER — NEBIVOLOL HCL 10 MG PO TABS
10.0000 mg | ORAL_TABLET | Freq: Every day | ORAL | Status: DC
  Filled 2011-10-30: qty 1

## 2011-10-30 MED ORDER — SODIUM CHLORIDE 0.9 % IV SOLN: 250.0000 mL | INTRAVENOUS | Status: DC | PRN

## 2011-10-30 MED ORDER — IPRATROPIUM BROMIDE 0.02 % IN SOLN
0.5000 mg | Freq: Once | RESPIRATORY_TRACT | Status: AC
  Administered 2011-10-30: 0.5 mg via RESPIRATORY_TRACT
  Filled 2011-10-30: qty 2.5

## 2011-10-30 MED ORDER — SODIUM CHLORIDE 0.9 % IJ SOLN: 3.0000 mL | INTRAMUSCULAR | Status: DC | PRN

## 2011-10-30 MED ORDER — FUROSEMIDE 10 MG/ML IJ SOLN
40.0000 mg | Freq: Once | INTRAMUSCULAR | Status: AC
  Administered 2011-10-30: 40 mg via INTRAVENOUS
  Filled 2011-10-30: qty 4

## 2011-10-30 MED ORDER — ACETAMINOPHEN 325 MG PO TABS: 650.0000 mg | ORAL_TABLET | ORAL | Status: DC | PRN

## 2011-10-30 MED ORDER — BUPROPION HCL ER (SR) 150 MG PO TB12
150.0000 mg | ORAL_TABLET | Freq: Two times a day (BID) | ORAL | Status: DC
  Administered 2011-10-30 (×2): 150 mg via ORAL
  Filled 2011-10-30 (×4): qty 1

## 2011-10-30 MED ORDER — HEPARIN (PORCINE) IN NACL 100-0.45 UNIT/ML-% IJ SOLN
900.0000 [IU]/h | INTRAMUSCULAR | Status: DC
  Filled 2011-10-30: qty 250

## 2011-10-30 MED ORDER — ALBUTEROL SULFATE (5 MG/ML) 0.5% IN NEBU
INHALATION_SOLUTION | RESPIRATORY_TRACT | Status: AC
  Administered 2011-10-30: 03:00:00
  Filled 2011-10-30: qty 3

## 2011-10-30 MED ORDER — NITROGLYCERIN 0.4 MG SL SUBL: 0.4000 mg | SUBLINGUAL_TABLET | SUBLINGUAL | Status: DC | PRN

## 2011-10-30 MED ORDER — MIDAZOLAM HCL 2 MG/2ML IJ SOLN
INTRAMUSCULAR | Status: AC
  Filled 2011-10-30: qty 2

## 2011-10-30 MED ORDER — ONDANSETRON HCL 4 MG/2ML IJ SOLN: 4.0000 mg | Freq: Four times a day (QID) | INTRAMUSCULAR | Status: DC | PRN

## 2011-10-30 MED ORDER — SODIUM CHLORIDE 0.9 % IJ SOLN: 3.0000 mL | Freq: Two times a day (BID) | INTRAMUSCULAR | Status: DC

## 2011-10-30 MED ORDER — AMLODIPINE-OLMESARTAN 10-40 MG PO TABS: 1.0000 | ORAL_TABLET | Freq: Every day | ORAL | Status: DC

## 2011-10-30 MED ORDER — SIMVASTATIN 5 MG PO TABS
5.0000 mg | ORAL_TABLET | Freq: Every day | ORAL | Status: DC
  Administered 2011-10-30: 5 mg via ORAL
  Filled 2011-10-30 (×2): qty 1

## 2011-10-30 MED ORDER — HEPARIN BOLUS VIA INFUSION
4000.0000 [IU] | Freq: Once | INTRAVENOUS | Status: DC
  Filled 2011-10-30: qty 4000

## 2011-10-30 MED ORDER — HEPARIN (PORCINE) IN NACL 100-0.45 UNIT/ML-% IJ SOLN: 10.0000 [IU]/kg/h | INTRAMUSCULAR | Status: DC

## 2011-10-30 MED ORDER — AMLODIPINE BESYLATE 10 MG PO TABS
10.0000 mg | ORAL_TABLET | Freq: Every day | ORAL | Status: DC
  Administered 2011-10-30: 10 mg via ORAL
  Filled 2011-10-30 (×2): qty 1

## 2011-10-30 MED ORDER — VERAPAMIL HCL 2.5 MG/ML IV SOLN
INTRAVENOUS | Status: AC
  Filled 2011-10-30: qty 2

## 2011-10-30 MED ORDER — ASPIRIN 81 MG PO CHEW: 81.0000 mg | CHEWABLE_TABLET | Freq: Every day | ORAL | Status: DC

## 2011-10-30 MED ORDER — BIVALIRUDIN 250 MG IV SOLR
INTRAVENOUS | Status: AC
  Filled 2011-10-30: qty 250

## 2011-10-30 MED ORDER — SODIUM CHLORIDE 0.9 % IV SOLN: 1.0000 mL/kg/h | INTRAVENOUS | Status: AC

## 2011-10-30 MED ORDER — SODIUM CHLORIDE 0.9 % IV SOLN: INTRAVENOUS | Status: DC

## 2011-10-30 MED ORDER — ALBUTEROL SULFATE (5 MG/ML) 0.5% IN NEBU: 5.0000 mg | INHALATION_SOLUTION | Freq: Once | RESPIRATORY_TRACT | Status: DC

## 2011-10-30 MED ORDER — PHENYTOIN SODIUM EXTENDED 100 MG PO CAPS
100.0000 mg | ORAL_CAPSULE | Freq: Two times a day (BID) | ORAL | Status: DC
  Administered 2011-10-30: 100 mg via ORAL
  Filled 2011-10-30 (×3): qty 1

## 2011-10-30 MED ORDER — TERAZOSIN HCL 5 MG PO CAPS
5.0000 mg | ORAL_CAPSULE | Freq: Once | ORAL | Status: DC
  Filled 2011-10-30: qty 1

## 2011-10-30 MED ORDER — ASPIRIN 81 MG PO CHEW
324.0000 mg | CHEWABLE_TABLET | Freq: Once | ORAL | Status: AC
  Administered 2011-10-30: 324 mg via ORAL
  Filled 2011-10-30: qty 4

## 2011-10-30 MED ORDER — HYDRALAZINE HCL 20 MG/ML IJ SOLN
10.0000 mg | INTRAMUSCULAR | Status: DC | PRN
  Administered 2011-10-30 – 2011-10-31 (×2): 10 mg via INTRAVENOUS
  Filled 2011-10-30: qty 0.5

## 2011-10-30 MED ORDER — NITROGLYCERIN 0.2 MG/ML ON CALL CATH LAB
INTRAVENOUS | Status: AC
  Filled 2011-10-30: qty 1

## 2011-10-30 MED ORDER — NITROGLYCERIN IN D5W 200-5 MCG/ML-% IV SOLN
5.0000 ug/min | Freq: Once | INTRAVENOUS | Status: AC
  Administered 2011-10-30: 5 ug/min via INTRAVENOUS
  Filled 2011-10-30: qty 250

## 2011-10-30 MED ORDER — METHYLPREDNISOLONE SODIUM SUCC 125 MG IJ SOLR
INTRAMUSCULAR | Status: AC
  Administered 2011-10-30: 03:00:00
  Filled 2011-10-30: qty 2

## 2011-10-30 MED ORDER — IRBESARTAN 300 MG PO TABS
300.0000 mg | ORAL_TABLET | Freq: Every day | ORAL | Status: DC
  Administered 2011-10-30: 300 mg via ORAL
  Filled 2011-10-30 (×3): qty 1

## 2011-10-30 MED ORDER — ASPIRIN 81 MG PO CHEW: 81.0000 mg | CHEWABLE_TABLET | Freq: Once | ORAL | Status: DC

## 2011-10-30 MED ORDER — ALBUTEROL SULFATE (5 MG/ML) 0.5% IN NEBU
5.0000 mg | INHALATION_SOLUTION | Freq: Once | RESPIRATORY_TRACT | Status: AC
  Administered 2011-10-30: 5 mg via RESPIRATORY_TRACT
  Filled 2011-10-30: qty 1

## 2011-10-30 MED ORDER — CLOPIDOGREL BISULFATE 75 MG PO TABS: 75.0000 mg | ORAL_TABLET | Freq: Every day | ORAL | Status: DC

## 2011-10-30 MED ORDER — LIDOCAINE HCL (PF) 1 % IJ SOLN
INTRAMUSCULAR | Status: AC
  Filled 2011-10-30: qty 30

## 2011-10-30 MED ORDER — HEPARIN (PORCINE) IN NACL 2-0.9 UNIT/ML-% IJ SOLN
INTRAMUSCULAR | Status: AC
  Filled 2011-10-30: qty 2000

## 2011-10-30 MED ORDER — SODIUM CHLORIDE 0.9 % IJ SOLN
3.0000 mL | Freq: Two times a day (BID) | INTRAMUSCULAR | Status: DC
  Administered 2011-10-30: 3 mL via INTRAVENOUS

## 2011-10-30 MED ORDER — ASPIRIN 81 MG PO TABS: 81.0000 mg | ORAL_TABLET | Freq: Every day | ORAL | Status: DC

## 2011-10-30 MED ORDER — TERAZOSIN HCL 5 MG PO CAPS
5.0000 mg | ORAL_CAPSULE | Freq: Every day | ORAL | Status: DC
  Administered 2011-10-30: 5 mg via ORAL
  Filled 2011-10-30 (×2): qty 1

## 2011-10-30 MED ORDER — CLOPIDOGREL BISULFATE 75 MG PO TABS
75.0000 mg | ORAL_TABLET | Freq: Every day | ORAL | Status: DC
  Administered 2011-10-31: 75 mg via ORAL
  Filled 2011-10-30: qty 1

## 2011-10-30 MED ORDER — FENTANYL CITRATE 0.05 MG/ML IJ SOLN
INTRAMUSCULAR | Status: AC
  Filled 2011-10-30: qty 2

## 2011-10-30 MED ORDER — NITROGLYCERIN 2 % TD OINT
0.5000 [in_us] | TOPICAL_OINTMENT | Freq: Once | TRANSDERMAL | Status: AC
  Administered 2011-10-30: 0.5 [in_us] via TOPICAL
  Filled 2011-10-30: qty 1

## 2011-10-30 MED ORDER — CLOPIDOGREL BISULFATE 75 MG PO TABS: 75.0000 mg | ORAL_TABLET | Freq: Once | ORAL | Status: DC

## 2011-10-30 MED ORDER — HEPARIN SODIUM (PORCINE) 1000 UNIT/ML IJ SOLN
INTRAMUSCULAR | Status: AC
  Filled 2011-10-30: qty 1

## 2011-10-30 MED ORDER — PHENYTOIN SODIUM EXTENDED 100 MG PO CAPS
100.0000 mg | ORAL_CAPSULE | Freq: Once | ORAL | Status: DC
  Filled 2011-10-30: qty 1

## 2011-10-30 MED ORDER — NEBIVOLOL HCL 10 MG PO TABS
10.0000 mg | ORAL_TABLET | Freq: Every day | ORAL | Status: DC
  Administered 2011-10-30: 10 mg via ORAL
  Filled 2011-10-30: qty 1

## 2011-10-30 MED ORDER — METOPROLOL SUCCINATE ER 50 MG PO TB24
50.0000 mg | ORAL_TABLET | Freq: Every day | ORAL | Status: DC
  Filled 2011-10-30: qty 1

## 2011-10-30 MED ORDER — ASPIRIN EC 81 MG PO TBEC: 81.0000 mg | DELAYED_RELEASE_TABLET | Freq: Every day | ORAL | Status: DC

## 2011-10-30 MED ORDER — IPRATROPIUM BROMIDE 0.02 % IN SOLN
RESPIRATORY_TRACT | Status: AC
  Administered 2011-10-30: 03:00:00
  Filled 2011-10-30: qty 2.5

## 2011-10-30 NOTE — Progress Notes (Signed)
ANTICOAGULATION CONSULT NOTE - Initial Consult  Pharmacy Consult for Heparin Indication: chest pain/ACS  No Known Allergies  Patient Measurements:   5'8" 75 kg Heparin Dosing Weight:75 kg  Vital Signs: Temp: 97.8 F (36.6 C) (06/13 0310) Temp src: Oral (06/13 0310) BP: 152/68 mmHg (06/13 0507) Pulse Rate: 63  (06/13 0507)  Labs:  Alvira Philips 10/30/11 0423 10/30/11 0337 10/30/11 0316  HGB -- 12.9* 12.1*  HCT -- 38.0* 34.2*  PLT -- -- 161  APTT 34 -- --  LABPROT -- -- --  INR -- -- --  HEPARINUNFRC -- -- --  CREATININE -- 1.00 --  CKTOTAL -- -- --  CKMB -- -- --  TROPONINI -- -- --    The CrCl is unknown because both a height and weight (above a minimum accepted value) are required for this calculation.   Medical History: Past Medical History  Diagnosis Date  . Coronary artery disease   . Shortness of breath   . Seizures   . Hypertension   . Pneumonia     Medications:  Scheduled:    . albuterol  5 mg Nebulization Once  . albuterol  5 mg Nebulization Once  . albuterol      . amLODipine  10 mg Oral Daily  . aspirin  324 mg Oral Once  . aspirin  81 mg Oral Daily  . aspirin EC  81 mg Oral Daily  . buPROPion  150 mg Oral BID  . clopidogrel  75 mg Oral Once  . clopidogrel  75 mg Oral Q breakfast  . furosemide  40 mg Intravenous Once  . ipratropium      . ipratropium  0.5 mg Nebulization Once  . irbesartan  300 mg Oral Daily  . methylPREDNISolone sodium succinate      . metoprolol succinate  50 mg Oral Daily  . nitroGLYCERIN  0.5 inch Topical Once  . nitroGLYCERIN  5 mcg/min Intravenous Once  . phenytoin  100 mg Oral Once  . phenytoin  100 mg Oral BID  . simvastatin  5 mg Oral q1800  . sodium chloride  3 mL Intravenous Q12H  . terazosin  5 mg Oral QHS  . DISCONTD: amLODipine-olmesartan  1 tablet Oral Daily  . DISCONTD: aspirin  324 mg Oral NOW  . DISCONTD: aspirin  81 mg Oral Once  . DISCONTD: aspirin  81 mg Oral Daily  . DISCONTD: terazosin  5 mg  Oral Once    Assessment: 66 yo male admitted with r/o ACS and possible cardiac cath for today. Pharmacy consulted to manage IV heparin.   Goal of Therapy:  Heparin level 0.3-0.7 units/ml Monitor platelets by anticoagulation protocol: Yes   Plan:  1. Heparin 4000 units IV x 1, then 900 units/hr.  2. Heparin level in 6 hours.  3. Daily CBC, heparin level  4. Follow-up post-cath  Emeline Gins 10/30/2011,6:30 AM

## 2011-10-30 NOTE — ED Notes (Signed)
REPORTS GIVEN TO FLOOR NURSE, NO PAIN AT TIME OF TRANSPORT , RESPIRATIONS UNLABORED , IV SITE UNREMARKABLE.

## 2011-10-30 NOTE — Progress Notes (Signed)
Utilization Review Completed.  Andre Guzman, Antimony T  10/30/2011

## 2011-10-30 NOTE — CV Procedure (Signed)
Procedure performed:  Left heart catheterization including hemodynamic monitoring of the left ventricle, selective right and left coronary arteriography. PTCA and cutting balloon angioplasty with 3.0 x 10 mm cutting of the in-stent restenotic circumflex coronary artery.  Indications: Patient underwent cardiac catheterization and peripheral arteriogram on 07/01/2011. This revealed patent coronary arteries however the circumflex coronary artery had a high-grade calcific 90% ostial stenosis. Underwent successful angioiplasty of circumflex coroanry artery on 07/22/11. A non-drug-eluting stent was implanted do to prior history of intracerebral bleed and patient was not supposed to be on dual antiplatelet therapy for more than just a few months. He was now admitted to the hospital with unstable angina and pulmonary edema. 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   Hemodynamic data:  Left ventricular pressure was 134/26 with LVEDP of 32 mm mercury. Aortic pressure was 136/65 with a mean of 88 mm mercury.  Left ventricle: Not performed to conserve contrast.   Right coronary artery: The vessel is smooth, with mid 50% stenosis. Anterior take off. Dominant..  Left main coronary artery is large distal LM 30% stenosis.  Circumflex coronary artery: A large vessel giving origin to a large obtuse marginal 1. 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 OM1 has proximal 90% stenosis and it is a 1.5 mm vessel.  LAD:  LAD gives origin to a large diagonal-1.  LAD has proximal ostial 30% stenoses and diffuse luminal irregularities.   Technique: Under sterile precautions using a 6 French right radial  arterial access, a 6 French sheath was introduced into the right radial artery. A 5 [place] Tig 4 catheter was advanced into the ascending aorta selective  right coronary artery and left coronary artery was  cannulated and angiography was performed in multiple views. The catheter was pulled back Out of the body over exchange length J-wire.  Same Catheter was used to perform LV hemodynamics.   Catheter exchanged out of the body over J-Wire. NO immediate complications noted. Patient tolerated the procedure well.     Technique of intervention:  Using a 6 [place] XB 3.5 guide catheter the LM  coronary  was selected and cannulated. Using Angiomax for anticoagulation, I utilized a Couger guidewire and across the circumflex coronary artery without any difficulty. I placed the tip of the wire into the distal  coronary artery. Angiography was performed.   Then I utilized a 2.0x15 Sprinter balloon , I performed balloon angioplasty at 14 pressure x 2 for 60 seconds each.   I proceeded with cutting balloon angioplasty with a 3x10 mm balloon in the  circumflex coronary artery. Multiple balloon inflations were performed at a peak of percent pressure for 90 seconds each x2 and in the distal stent at 6 atmospheres pressure for 60 seconds. There was rupture of the cutting balloon without any adverse consequences. Post-balloon angioplasty results were excellent with 0% residual stenoses and TIMI-3 flow was maintained. There was no evidence of edge dissection. The guidewire was withdrawn out of the body and the guide catheter was engaged and pulled out of the body over the J-wire the was no immediate complication. Patient tolerated the procedure well.  Disposition: Patient will be discharged in am unless complications with out-patient follow up. Will need Dual antiplatelet therapy with Plaavix and ASA 81 mg for at least 30 days.  

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
## 2011-10-30 NOTE — ED Notes (Signed)
PT. ARRIVED WITH EMS FROM HOME REPORTS PROGRESSING SOB WITH WHEEZING THIS EVENING , PT. RECEIVED IV SOLUMEDROL 125 MG , 3 DOSES OF ALBUTEROL W/ ATROVENT PRIOR TO ARRIVAL .

## 2011-10-30 NOTE — ED Provider Notes (Signed)
History     CSN: [id number]  Arrival date & time 10/30/11  0302   First MD Initiated Contact with Patient 10/30/11 [phone]      Chief Complaint  Patient presents with  . Shortness of Breath    (Consider location/radiation/quality/duration/timing/severity/associated sxs/prior treatment) Patient is a 66 y.o. male presenting with shortness of breath. The history is provided by the patient.  Shortness of Breath  The current episode started yesterday. The onset was sudden. The problem occurs continuously. The problem has been gradually worsening. The problem is severe. Nothing relieves the symptoms. Nothing aggravates the symptoms. Associated symptoms include chest pressure, shortness of breath and wheezing. There was no intake of a foreign body. He was not exposed to toxic fumes. He has had prior hospitalizations. He has been behaving normally.    Past Medical History  Diagnosis Date  . Coronary artery disease   . Shortness of breath   . Seizures   . Hypertension   . Pneumonia     Past Surgical History  Procedure Date  . Cardiac catheterization   . Coronary angioplasty with stent placement 07/22/2011  . Cardiac valve surgery   . Coronary artery bypass graft     No family history on file.  History  Substance Use Topics  . Smoking status: Former Smoker    Types: Cigarettes    Quit date: 06/24/2011  . Smokeless tobacco: Never Used  . Alcohol Use: Yes     social      Review of Systems  Respiratory: Positive for chest tightness, shortness of breath and wheezing.   All other systems reviewed and are negative.    Allergies  Review of patient's allergies indicates no known allergies.  Home Medications   Current Outpatient Rx  Name Route Sig Dispense Refill  . AMLODIPINE-OLMESARTAN 10-40 MG PO TABS Oral Take 1 tablet by mouth daily.    . ASPIRIN 81 MG PO TABS Oral Take 81 mg by mouth daily.    Marland Kitchen CLOPIDOGREL BISULFATE 75 MG PO TABS Oral Take 1 tablet (75 mg total) by  mouth daily with breakfast. 30 tablet 1  . METOPROLOL SUCCINATE ER 100 MG PO TB24 Oral Take 50 mg by mouth daily.     Marland Kitchen PHENYTOIN SODIUM EXTENDED 100 MG PO CAPS Oral Take 100 mg by mouth 2 (two) times daily.    Marland Kitchen PRAVASTATIN SODIUM 20 MG PO TABS Oral Take 20 mg by mouth daily.    Marland Kitchen TERAZOSIN HCL 5 MG PO CAPS Oral Take 5 mg by mouth at bedtime.      BP 199/87  Pulse 56  Temp 97.8 F (36.6 C) (Oral)  Resp 20  SpO2 100%  Physical Exam  Constitutional: He is oriented to person, place, and time. He appears well-developed and well-nourished.  HENT:  Head: Normocephalic and atraumatic.  Mouth/Throat: Oropharynx is clear and moist.  Eyes: Conjunctivae are normal. Pupils are equal, round, and reactive to light.  Neck: Normal range of motion. Neck supple.  Cardiovascular: Normal rate and regular rhythm.   Pulmonary/Chest: He has wheezes.  Abdominal: Soft. Bowel sounds are normal. There is no tenderness. There is no rebound and no guarding.  Musculoskeletal: Normal range of motion.  Neurological: He is alert and oriented to person, place, and time. He has normal reflexes.  Skin: Skin is warm and dry. He is not diaphoretic.  Psychiatric: He has a normal mood and affect.    ED Course  Procedures (including critical care time)  Labs Reviewed  CBC - Abnormal; Notable for the following:    RBC 4.02 (*)     Hemoglobin 12.1 (*)     HCT 34.2 (*)     All other components within normal limits  POCT I-STAT, CHEM 8 - Abnormal; Notable for the following:    Glucose, Bld 113 (*)     Hemoglobin 12.9 (*)     HCT 38.0 (*)     All other components within normal limits  DIFFERENTIAL  POCT I-STAT TROPONIN I  PRO B NATRIURETIC PEPTIDE   Dg Chest Port 1 View  10/30/2011  *RADIOLOGY REPORT*  Clinical Data: Shortness of breath  PORTABLE CHEST - 1 VIEW  Comparison: 10/26/2011  Findings: Cardiomegaly.  Central vascular congestion.  Interstitial prominence.  Mild lung base opacities. Small effusions not  excluded.  Status post median sternotomy.  Round metallic fragment projects over the right shoulder.  IMPRESSION: Interstitial prominence may reflect edema (favored) or atypical infection.  Bibasilar opacities; atelectasis versus infiltrate.  Original Report Authenticated By: Waneta Martins, M.D.     No diagnosis found.    MDM   Date: 10/30/2011  Rate: 56  Rhythm: sinus bradycardia  QRS Axis: normal  Intervals: normal  ST/T Wave abnormalities: ST depressions laterally  Conduction Disutrbances:none  Narrative Interpretation:   Old EKG Reviewed: unchanged     Case d/w Dr. Jacinto Halim, NPO admit to floor with holding orders.       Jasmine Awe, MD 10/30/11 [phone]

## 2011-10-30 NOTE — Progress Notes (Signed)
Elevated B/P 150-170 notified Dr.Ganji med order and given will continue to monitor BP. Patient asymptomatic and stable call bell within reach.

## 2011-10-30 NOTE — Interval H&P Note (Signed)
History and Physical Interval Note:  10/30/2011 7:40 AM  Andre Guzman  has presented today for surgery, with the diagnosis of cp  The various methods of treatment have been discussed with the patient and family. After consideration of risks, benefits and other options for treatment, the patient has consented to  Procedure(s) (LRB): LEFT HEART CATHETERIZATION WITH CORONARY ANGIOGRAM (N/A) as a surgical intervention .  The patients' history has been reviewed, patient examined, no change in status, stable for surgery.  I have reviewed the patients' chart and labs.  Questions were answered to the patient's satisfaction.     [name]

## 2011-10-30 NOTE — Telephone Encounter (Signed)
Received call from Dr. Jacinto Halim office on behalf of pt, Andre Guzman.  Pt hospitalized this morning for complaint of chest pressure. Pt will have a cath today.  Await findings.

## 2011-10-30 NOTE — Progress Notes (Signed)
TR BAND REMOVAL  LOCATION:  right radial  DEFLATED PER PROTOCOL:  yes  TIME BAND OFF / DRESSING APPLIED:1215      SITE UPON ARRIVAL:   Level 0  SITE AFTER BAND REMOVAL:  Level 0  REVERSE ALLEN'S TEST:    positive  CIRCULATION SENSATION AND MOVEMENT:  Within Normal Limits  yes  COMMENTS:     

## 2011-10-31 ENCOUNTER — Encounter (HOSPITAL_COMMUNITY): Admission: RE | Admit: 2011-10-31 | Payer: Medicare Other | Source: Ambulatory Visit

## 2011-10-31 LAB — CBC
MCH: 29.5 pg (ref 26.0–34.0)
MCHC: 35 g/dL (ref 30.0–36.0)
MCV: 84.2 fL (ref 78.0–100.0)
Platelets: 172 10*3/uL (ref 150–400)

## 2011-10-31 MED ORDER — NEBIVOLOL HCL 10 MG PO TABS: 10.0000 mg | ORAL_TABLET | Freq: Every day | ORAL | Status: DC

## 2011-10-31 MED FILL — Dextrose Inj 5%: INTRAVENOUS | Qty: 1000 | Status: AC

## 2011-10-31 NOTE — Progress Notes (Signed)
Pt. Discharged 10/31/2011  9:21 AM Discharge instructions reviewed with patient/family. Patient/family verbalized understanding. All Rx's given. Questions answered as needed. Pt. Discharged to home with family/self.  Andre Guzman

## 2011-10-31 NOTE — Progress Notes (Signed)
CARDIAC REHAB PHASE I   PRE:  Rate/Rhythm: 58 SB    BP: sitting 154/71    SaO2: 97 RA  MODE:  Ambulation: 680 ft   POST:  Rate/Rhythm: 71    BP: sitting 176/68     SaO2: 97 RA  Tolerated very well. No c/o SOB or chest tightness. BP up after walk. Pt unable to recall NTG use. Sts he carries it in pocket but did not use it last week when he had chest tightness (came to ER instead). Reminded pt of use. Also discussed walking at home, wrist restrictions and beginning CRPII next Wed (per Dr Jacinto Halim). Pt smoking 1-2 cigarettes a day (walks to store and buys singles). Reminded pt of risks. Pt sts he will not smoke again. [id number]  Harriet Masson CES, ACSM

## 2011-10-31 NOTE — Progress Notes (Signed)
Pt smoked 1/2 ppd and has quit since March 2013. He has remained tobacco free since. Congratulated and encouraged pt to remain tobacco free. Discussed relapse prevention strategies. Referred to 1-800 quit now for f/u and support. Discussed oral fixation substitutes, second hand smoke and in home smoking policy. Reviewed and gave pt Written education/contact information.

## 2011-10-31 NOTE — Discharge Summary (Signed)
Physician Discharge Summary  Patient ID: Andre Guzman MRN: [id number] DOB/AGE: 08/20/1945 66 y.o.  Admit date: 10/30/2011 Discharge date: 10/31/2011  Primary Discharge Diagnosis CAD In-stent restenosis of bare metal stent to ostial circumflex Coronary artery.   Secondary Discharge Diagnosis Hypertension Hyperlipidemia.  Significant Diagnostic Studies: Cardiac catheterization on 10/30/2011  Consults:   Hospital Course: Patient was admitted via emergency department with acute onset of shortness of breath and chest pain. He was taken to the clinic catheterization lab the same day and underwent successful angioplasty. The following day he remained asymptomatic and was felt to be stable for discharge.   Discharge Exam: Blood pressure 139/70, pulse 60, temperature 98.4 F (36.9 C), temperature source Oral, resp. rate 14, height 5\' 8"  (1.727 m), weight 75.6 kg (166 lb 10.7 oz), SpO2 97.00%.   Blood pressure 152/68, pulse 63, temperature 97.8 F (36.6 C), temperature source Oral, resp. rate 14, SpO2 94.00%.  GENERAL: Moderately built and Moderately body habitus, who is in no acute distress. Alert and oriented x 3. Appears stated age. There is no cyanosis.  NECK: supple without JVD or bruit  CARDIAC EXAM: S1 S2 normal, no gallop or murmur.  CHEST EXAM: No tenderness of chest wall.  LUNGS: Clear to percuss and auscultate. No rales or ronchi.  EXTREMITY: No edema.  NEUROLOGIC EXAM: Grossly intact without any focal deficits. Craniotomy scar evident on the right side of the temporal head.  VASCULAR EXAM: No skin breakdown. Carotids normal. Extremities: Femoral pulse normal with soft left femoral bruit. Popliteal pulse reduced and 1 plus left and normal right. Pedal pulse absent bilateral. No varicose veins. His right radial side is stable without hematoma and good pulse is present. Labs:   Lab Results  Component Value Date   WBC 6.1 10/31/2011   HGB 11.9* 10/31/2011   HCT 34.0* 10/31/2011     MCV 84.2 10/31/2011   PLT 172 10/31/2011    Lab 10/30/11 0337 10/26/11 0755  NA 141 --  K 4.0 --  CL 104 --  CO2 -- 28  BUN 18 --  CREATININE 1.00 --  CALCIUM -- 8.9  PROT -- --  BILITOT -- --  ALKPHOS -- --  ALT -- --  AST -- --  GLUCOSE 113* --   Lipid Panel     Component Value Date/Time   CHOL 150 10/30/2011 0711   TRIG 53 10/30/2011 0711   HDL 64 10/30/2011 0711   CHOLHDL 2.3 10/30/2011 0711   VLDL 11 10/30/2011 0711   LDLCALC 75 10/30/2011 0711      Radiology: Dg Chest 2 View  10/26/2011  *RADIOLOGY REPORT*  Clinical Data: Chest pain and shortness of breath  CHEST - 2 VIEW  Comparison: 05/04/2011and 06/20/2009  Findings: There are changes of median sternotomy.  Heart size is stable within normal limits.  Pulmonary vascularity is prominent. There is diffuse interstitial prominence bilaterally with Kerley B lines.  Trace bilateral pleural effusions are suspected in the costophrenic angles.  Trachea midline.  No acute bony abnormality. A metallic BB projects over the superficial anterior right upper chest, unchanged from prior exams.  IMPRESSION: Pulmonary vascular congestion and interstitial pulmonary edema. Suspect trace bilateral pleural effusions.  Original Report Authenticated By: Britta Mccreedy, M.D.   Dg Chest Port 1 View  10/30/2011  *RADIOLOGY REPORT*  Clinical Data: Shortness of breath  PORTABLE CHEST - 1 VIEW  Comparison: 10/26/2011  Findings: Cardiomegaly.  Central vascular congestion.  Interstitial prominence.  Mild lung base opacities. Small effusions not excluded.  Status post median sternotomy.  Round metallic fragment projects over the right shoulder.  IMPRESSION: Interstitial prominence may reflect edema (favored) or atypical infection.  Bibasilar opacities; atelectasis versus infiltrate.  Original Report Authenticated By: Waneta Martins, M.D.    EKG: NSR. LVH with repolarization abnormaltiy.  FOLLOW UP PLANS AND APPOINTMENTS Dr. Jacinto Halim in 3-4  weeks   Medication List  As of 10/31/2011  7:38 AM   STOP taking these medications         metoprolol succinate 100 MG 24 hr tablet         TAKE these medications         aspirin 81 MG tablet   Take 81 mg by mouth daily.      AZOR 10-40 MG per tablet   Generic drug: amLODipine-olmesartan   Take 1 tablet by mouth daily.      buPROPion 150 MG 12 hr tablet   Commonly known as: WELLBUTRIN SR   Take 150 mg by mouth 2 (two) times daily.      clopidogrel 75 MG tablet   Commonly known as: PLAVIX   Take 1 tablet (75 mg total) by mouth daily with breakfast.      nebivolol 10 MG tablet   Commonly known as: BYSTOLIC   Take 1 tablet (10 mg total) by mouth daily.      phenytoin 100 MG ER capsule   Commonly known as: DILANTIN   Take 100 mg by mouth 2 (two) times daily.      pravastatin 20 MG tablet   Commonly known as: PRAVACHOL   Take 20 mg by mouth daily.      terazosin 5 MG capsule   Commonly known as: HYTRIN   Take 5 mg by mouth at bedtime.           Follow-up Information    Call [facility] R, MD. (OV in 3-4 weeks)    Contact information:   [address]  [place] [address] [phone]           [name], MD 10/31/2011, 7:38 AM

## 2011-10-31 NOTE — Discharge Instructions (Signed)
Heart Failure Heart failure (HF) is a condition in which the heart has trouble pumping blood. This means your heart does not pump blood efficiently for your body to work well. In some cases of HF, fluid may back up into your lungs or you may have swelling (edema) in your lower legs. HF is a long-term (chronic) condition. It is important for you to take good care of yourself and follow your caregiver's treatment plan. CAUSES   Health conditions:   High blood pressure (hypertension) causes the heart muscle to work harder than normal. When pressure in the blood vessels is high, the heart needs to pump (contract) with more force in order to circulate blood throughout the body. High blood pressure eventually causes the heart to become stiff and weak.   Coronary artery disease (CAD) is the buildup of cholesterol and fat (plaques) in the arteries of the heart. The blockage in the arteries deprives the heart muscle of oxygen and blood. This can cause chest pain and may lead to a heart attack. High blood pressure can also contribute to CAD.   Heart attack (myocardial infarction) occurs when 1 or more arteries in the heart become blocked. The loss of oxygen damages the muscle tissue of the heart. When this happens, part of the heart muscle dies. The injured tissue does not contract as well and weakens the heart's ability to pump blood.   Abnormal heart valves can cause HF when the heart valves do not open and close properly. This makes the heart muscle pump harder to keep the blood flowing.   Heart muscle disease (cardiomyopathy or myocarditis) is damage to the heart muscle from a variety of causes. These can include drug or alcohol abuse, infections, or unknown reasons. These can increase the risk of HF.   Lung disease makes the heart work harder because the lungs do not work properly. This can cause a strain on the heart leading it to fail.   Diabetes increases the risk of HF. High blood sugar contributes  to high fat (lipid) levels in the blood. Diabetes can also cause slow damage to tiny blood vessels that carry important nutrients to the heart muscle. When the heart does not get enough oxygen and food, it can cause the heart to become weak and stiff. This leads to a heart that does not contract efficiently.   Other diseases can contribute to HF. These include abnormal heart rhythms, thyroid problems, and low blood counts (anemia).   Unhealthy lifestyle habits:   Obesity.   Smoking.   Eating foods high in fat and cholesterol.   Eating or drinking beverages high in salt.   Drug or alcohol abuse.   Lack of exercise.  SYMPTOMS  HF symptoms may vary and can be hard to detect. Symptoms may include:  Shortness of breath with activity, such as climbing stairs.   Persistent cough.   Swelling of the feet, ankles, legs, or abdomen.   Unexplained weight gain.   Difficulty breathing when lying flat.   Waking from sleep because of the need to sit up and get more air.   Rapid heartbeat.   Fatigue and loss of energy.   Feeling lightheaded or close to fainting.  DIAGNOSIS  A diagnosis of HF is based on your history, symptoms, physical examination, and diagnostic tests. Diagnostic tests for HF may include:  EKG.   Chest X-ray.   Blood tests.   Exercise stress test.   Blood oxygen test (arterial blood gas).   Evaluation   by a heart doctor (cardiologist).   Ultrasound evaluation of the heart (echocardiogram).   Heart artery test to look for blockages (angiogram).   Radioactive imaging to look at the heart (radionuclide test).  TREATMENT  Treatment is aimed at managing the symptoms of HF. Medicines, lifestyle changes, or surgical intervention may be necessary to treat HF.  Medicines to help treat HF may include:   Angiotensin-converting enzyme (ACE) inhibitors. These block the effects of a blood protein called angiotensin-converting enzyme. ACE inhibitors relax (dilate) the  blood vessels and help lower blood pressure. This decreases the workload of the heart, slows the progression of HF, and improves symptoms.   Angiotensin receptor blockers (ARBs). These medications work similar to ACE inhibitors. ARBs may be an alternative for people who cannot tolerate an ACE inhibitor.   Aldosterone antagonists. This medication helps get rid of extra fluid from your body. This lowers the volume of blood the heart has to pump.   Water pills (diuretics). Diuretics cause the kidneys to remove salt and water from the blood. The extra fluid is removed by urination. By removing extra fluid from the body, diuretics help lower the workload of the heart and help prevent fluid buildup in the lungs so breathing is easier.   Beta blockers. These prevent the heart from beating too fast and improve heart muscle strength. Beta blockers help maintain a normal heart rate, control blood pressure, and improve HF symptoms.   Digitalis. This increases the force of the heartbeat and may be helpful to people with HF or heart rhythm problems.   Healthy lifestyle changes include:   Stopping smoking.   Eating a healthy diet. Avoid foods high in fat. Avoid foods fried in oil or made with fat. A dietician can help with healthy food choices.   Limiting how much salt you eat.   Limiting alcohol intake to no more than 1 drink per day for women and 2 drinks per day for men. Drinking more than that is harmful to your heart. If your heart has already been damaged by alcohol or you have severe HF, drinking alcohol should be stopped completely.   Exercising as directed by your caregiver.   Surgical treatment for HF may include:   Procedures to open blocked arteries, repair damaged heart valves, or remove damaged heart muscle tissue.   A pacemaker to help heart muscle function and to control certain abnormal heart rhythms.   A defibrillator to possibly prevent sudden cardiac death.  HOME CARE  INSTRUCTIONS   Activity level. Your caregiver can help you determine what type of exercise program may be helpful. It is important to maintain your strength. Pace your physical activity to avoid shortness of breath or chest pain. Rest for 1 hour before and after meals. A cardiac rehabilitation program may be helpful to some people with HF.   Diet. Eat a heart healthy diet. Food choices should be low in saturated fat and cholesterol. Talk to a dietician to learn about heart healthy foods.   Salt intake. When you have HF, you need to limit the amount of salt you eat. Eat less than 1500 milligrams (mg) of salt per day or as recommended by your caregiver.   Weight monitoring. Weigh yourself every day. You should weigh yourself in the morning after you urinate and before you eat breakfast. Wear the same amount of clothing each time you weigh yourself. Record your weight daily. Bring your recorded weights to your clinic visits. Tell your caregiver right away if   you have gained 3 lb/1.4 kg in 1 day, or 5 lb/2.3 kg in a week or whatever amount you were told to report.   Blood pressure monitoring. This should be done as directed by your caregiver. A home blood pressure cuff can be purchased at a drugstore. Record your blood pressure numbers and bring them to your clinic visits. Tell your caregiver if you become dizzy or lightheaded upon standing up.   Smoking. If you are currently a smoker, it is time to quit. Nicotine makes your heart work harder by causing your blood vessels to constrict. Do not use nicotine gum or patches before talking to your caregiver.   Follow up. Be sure to schedule a follow-up visit with your caregiver. Keep all your appointments.  SEEK MEDICAL CARE IF:   Your weight increases by 3 lb/1.4 kg in 1 day or 5 lb/2.3 kg in a week.   You notice increasing shortness of breath that is unusual for you. This may happen during rest, sleep, or with activity.   You cough more than normal,  especially with physical activity.   You notice more swelling in your hands, feet, ankles, or belly (abdomen).   You are unable to sleep because it is hard to breathe.   You cough up bloody mucus (sputum).   You begin to feel "jumping" or "fluttering" sensations (palpitations) in your chest.  SEEK IMMEDIATE MEDICAL CARE IF:   You have severe chest pain or pressure which may include symptoms such as:   Pain or pressure in the arms, neck, jaw, or back.   Feeling sweaty.   Feeling sick to your stomach (nauseous).   Feeling short of breath while at rest.   Having a fast or irregular heartbeat.   You experience stroke symptoms. These symptoms include:   Facial weakness or numbness.   Weakness or numbness in an arm, leg, or on one side of your body.   Blurred vision.   Difficulty talking or thinking.   Dizziness or fainting.   Severe headache.  THESE ARE MEDICAL EMERGENCIES. Do not wait to see if the symptoms go away. Call your local emergency services (911 in [place] DO NOT drive yourself to the hospital. IMPORTANT  Make a list of every medicine, vitamin, or herbal supplement you are taking. Keep the list with you at all times. Show it to your caregiver at every visit. Keep the list up-to-date.   Ask your caregiver or pharmacist to write an explanation of each medicine you are taking. This should include:   Why you are taking it.   The possible side effects.   The best time of day to take it.   Foods to take with it or what foods to avoid.   When to stop taking it.  MAKE SURE YOU:   Understand these instructions.   Will watch your condition.   Will get help right away if you are not doing well or get worse.  Document Released: 05/05/2005 Document Revised: 04/24/2011 Document Reviewed: 08/17/2009 [employer] Patient Information 2012 [employer]. 

## 2011-11-03 ENCOUNTER — Encounter (HOSPITAL_COMMUNITY): Admission: RE | Admit: 2011-11-03 | Payer: Medicare Other | Source: Ambulatory Visit

## 2011-11-04 LAB — POCT ACTIVATED CLOTTING TIME: Activated Clotting Time: 452 seconds

## 2011-11-05 ENCOUNTER — Encounter (HOSPITAL_COMMUNITY)
Admission: RE | Admit: 2011-11-05 | Discharge: 2011-11-05 | Disposition: A | Discharge status: Home / Self Care | Payer: Medicare Other | Source: Ambulatory Visit | Attending: Cardiology | Admitting: Cardiology

## 2011-11-05 NOTE — Progress Notes (Signed)
Pt returned to cardiac rehab per md order for return date.  Pt  With medication change.  Toprol xl  100 mg d/cd. Will monitor pt.

## 2011-11-07 ENCOUNTER — Encounter (HOSPITAL_COMMUNITY): Payer: Medicare Other

## 2011-11-10 ENCOUNTER — Encounter (HOSPITAL_COMMUNITY)
Admission: RE | Admit: 2011-11-10 | Discharge: 2011-11-10 | Disposition: A | Discharge status: Home / Self Care | Payer: Medicare Other | Source: Ambulatory Visit | Attending: Cardiology | Admitting: Cardiology

## 2011-11-12 ENCOUNTER — Encounter (HOSPITAL_COMMUNITY)
Admission: RE | Admit: 2011-11-12 | Discharge: 2011-11-12 | Disposition: A | Discharge status: Home / Self Care | Payer: Medicare Other | Source: Ambulatory Visit | Attending: Cardiology | Admitting: Cardiology

## 2011-11-12 NOTE — Progress Notes (Signed)
Patient brought his medication bottles with him today. Andre Guzman has been taking metoprolol and bystolic  Together.  Upon looking at [facility] discharge summary, Andre Guzman's metoprolol was discontinued. Placed a red X on Andre Guzman's metoprolol bottle.  Patient instructed to take his bystolic only.  Will notify Dr Verl Dicker office via fax and phone call.  Spoke with the patients wife about his medication as well.

## 2011-11-14 ENCOUNTER — Encounter (HOSPITAL_COMMUNITY): Payer: Medicare Other

## 2011-11-17 ENCOUNTER — Encounter (HOSPITAL_COMMUNITY)
Admission: RE | Admit: 2011-11-17 | Discharge: 2011-11-17 | Disposition: A | Discharge status: Home / Self Care | Payer: Medicare Other | Source: Ambulatory Visit | Attending: Cardiology | Admitting: Cardiology

## 2011-11-17 DIAGNOSIS — I2510 Atherosclerotic heart disease of native coronary artery without angina pectoris: Secondary | ICD-10-CM | POA: Insufficient documentation

## 2011-11-17 DIAGNOSIS — Z9861 Coronary angioplasty status: Secondary | ICD-10-CM | POA: Insufficient documentation

## 2011-11-17 DIAGNOSIS — Z87891 Personal history of nicotine dependence: Secondary | ICD-10-CM | POA: Insufficient documentation

## 2011-11-17 DIAGNOSIS — Z5189 Encounter for other specified aftercare: Secondary | ICD-10-CM | POA: Insufficient documentation

## 2011-11-17 DIAGNOSIS — I10 Essential (primary) hypertension: Secondary | ICD-10-CM | POA: Insufficient documentation

## 2011-11-17 DIAGNOSIS — I70219 Atherosclerosis of native arteries of extremities with intermittent claudication, unspecified extremity: Secondary | ICD-10-CM | POA: Insufficient documentation

## 2011-11-17 DIAGNOSIS — E785 Hyperlipidemia, unspecified: Secondary | ICD-10-CM | POA: Insufficient documentation

## 2011-11-19 ENCOUNTER — Encounter (HOSPITAL_COMMUNITY)
Admission: RE | Admit: 2011-11-19 | Discharge: 2011-11-19 | Disposition: A | Discharge status: Home / Self Care | Payer: Medicare Other | Source: Ambulatory Visit | Attending: Cardiology | Admitting: Cardiology

## 2011-11-21 ENCOUNTER — Encounter (HOSPITAL_COMMUNITY): Payer: Medicare Other

## 2011-11-24 ENCOUNTER — Encounter (HOSPITAL_COMMUNITY)
Admission: RE | Admit: 2011-11-24 | Discharge: 2011-11-24 | Disposition: A | Discharge status: Home / Self Care | Payer: Medicare Other | Source: Ambulatory Visit | Attending: Cardiology | Admitting: Cardiology

## 2011-11-26 ENCOUNTER — Encounter (HOSPITAL_COMMUNITY)
Admission: RE | Admit: 2011-11-26 | Discharge: 2011-11-26 | Disposition: A | Discharge status: Home / Self Care | Payer: Medicare Other | Source: Ambulatory Visit | Attending: Cardiology | Admitting: Cardiology

## 2011-11-28 ENCOUNTER — Encounter (HOSPITAL_COMMUNITY): Payer: Medicare Other

## 2011-12-01 ENCOUNTER — Encounter (HOSPITAL_COMMUNITY)
Admission: RE | Admit: 2011-12-01 | Discharge: 2011-12-01 | Disposition: A | Discharge status: Home / Self Care | Payer: Medicare Other | Source: Ambulatory Visit | Attending: Cardiology | Admitting: Cardiology

## 2011-12-01 NOTE — Progress Notes (Signed)
Noted increase in weight. Pt reports heavier eating since he stopped smoking.  Encouraged pt to choose healthy food choices.  Lungs clear denies any swelling.

## 2011-12-03 ENCOUNTER — Encounter (HOSPITAL_COMMUNITY)
Admission: RE | Admit: 2011-12-03 | Discharge: 2011-12-03 | Disposition: A | Discharge status: Home / Self Care | Payer: Medicare Other | Source: Ambulatory Visit | Attending: Cardiology | Admitting: Cardiology

## 2011-12-05 ENCOUNTER — Encounter (HOSPITAL_COMMUNITY): Payer: Medicare Other

## 2011-12-08 ENCOUNTER — Encounter (HOSPITAL_COMMUNITY)
Admission: RE | Admit: 2011-12-08 | Discharge: 2011-12-08 | Disposition: A | Discharge status: Home / Self Care | Payer: Medicare Other | Source: Ambulatory Visit | Attending: Cardiology | Admitting: Cardiology

## 2011-12-10 ENCOUNTER — Encounter (HOSPITAL_COMMUNITY)
Admission: RE | Admit: 2011-12-10 | Discharge: 2011-12-10 | Disposition: A | Discharge status: Home / Self Care | Payer: Medicare Other | Source: Ambulatory Visit | Attending: Cardiology | Admitting: Cardiology

## 2011-12-12 ENCOUNTER — Encounter (HOSPITAL_COMMUNITY): Payer: Medicare Other

## 2011-12-15 ENCOUNTER — Encounter (HOSPITAL_COMMUNITY)
Admission: RE | Admit: 2011-12-15 | Discharge: 2011-12-15 | Disposition: A | Discharge status: Home / Self Care | Payer: Medicare Other | Source: Ambulatory Visit | Attending: Cardiology | Admitting: Cardiology

## 2011-12-17 ENCOUNTER — Encounter (HOSPITAL_COMMUNITY)
Admission: RE | Admit: 2011-12-17 | Discharge: 2011-12-17 | Disposition: A | Discharge status: Home / Self Care | Payer: Medicare Other | Source: Ambulatory Visit | Attending: Cardiology | Admitting: Cardiology

## 2011-12-19 ENCOUNTER — Encounter (HOSPITAL_COMMUNITY)
Admission: RE | Admit: 2011-12-19 | Discharge: 2011-12-19 | Disposition: A | Discharge status: Home / Self Care | Payer: Medicare Other | Source: Ambulatory Visit | Attending: Cardiology | Admitting: Cardiology

## 2011-12-19 DIAGNOSIS — I10 Essential (primary) hypertension: Secondary | ICD-10-CM | POA: Insufficient documentation

## 2011-12-19 DIAGNOSIS — I2510 Atherosclerotic heart disease of native coronary artery without angina pectoris: Secondary | ICD-10-CM | POA: Insufficient documentation

## 2011-12-19 DIAGNOSIS — I70219 Atherosclerosis of native arteries of extremities with intermittent claudication, unspecified extremity: Secondary | ICD-10-CM | POA: Insufficient documentation

## 2011-12-19 DIAGNOSIS — Z5189 Encounter for other specified aftercare: Secondary | ICD-10-CM | POA: Insufficient documentation

## 2011-12-19 DIAGNOSIS — E785 Hyperlipidemia, unspecified: Secondary | ICD-10-CM | POA: Insufficient documentation

## 2011-12-19 DIAGNOSIS — Z9861 Coronary angioplasty status: Secondary | ICD-10-CM | POA: Insufficient documentation

## 2011-12-19 DIAGNOSIS — Z87891 Personal history of nicotine dependence: Secondary | ICD-10-CM | POA: Insufficient documentation

## 2011-12-19 NOTE — Progress Notes (Signed)
Pt plans to graduate on today.  Pt given information about maintenance program.  Pt unsure if he can afford to attend financially.  Pt will confer with his wife.  Unable to reconcile medications with pt.  Pt is a poor historian regarding his medication.  Pt depends solely on his wife in regards to his medications.  Pt's wife does not stay with pt for his exercise session.

## 2012-03-08 ENCOUNTER — Emergency Department (HOSPITAL_COMMUNITY)
Admission: EM | Admit: 2012-03-08 | Discharge: 2012-03-08 | Disposition: A | Discharge status: Home / Self Care | Payer: Medicare Other | Attending: Emergency Medicine | Admitting: Emergency Medicine

## 2012-03-08 ENCOUNTER — Encounter (HOSPITAL_COMMUNITY): Payer: Self-pay | Admitting: *Deleted

## 2012-03-08 DIAGNOSIS — Z8701 Personal history of pneumonia (recurrent): Secondary | ICD-10-CM | POA: Insufficient documentation

## 2012-03-08 DIAGNOSIS — Z87891 Personal history of nicotine dependence: Secondary | ICD-10-CM | POA: Insufficient documentation

## 2012-03-08 DIAGNOSIS — I2510 Atherosclerotic heart disease of native coronary artery without angina pectoris: Secondary | ICD-10-CM | POA: Insufficient documentation

## 2012-03-08 DIAGNOSIS — I10 Essential (primary) hypertension: Secondary | ICD-10-CM | POA: Insufficient documentation

## 2012-03-08 DIAGNOSIS — Z79899 Other long term (current) drug therapy: Secondary | ICD-10-CM | POA: Insufficient documentation

## 2012-03-08 DIAGNOSIS — Z7982 Long term (current) use of aspirin: Secondary | ICD-10-CM | POA: Insufficient documentation

## 2012-03-08 DIAGNOSIS — Z7901 Long term (current) use of anticoagulants: Secondary | ICD-10-CM | POA: Insufficient documentation

## 2012-03-08 DIAGNOSIS — R569 Unspecified convulsions: Secondary | ICD-10-CM | POA: Insufficient documentation

## 2012-03-08 LAB — CBC
Hemoglobin: 13.5 g/dL (ref 13.0–17.0)
Platelets: 155 10*3/uL (ref 150–400)
RBC: 4.59 MIL/uL (ref 4.22–5.81)
WBC: 4.8 10*3/uL (ref 4.0–10.5)

## 2012-03-08 LAB — COMPREHENSIVE METABOLIC PANEL
Albumin: 3.2 g/dL — ABNORMAL LOW (ref 3.5–5.2)
BUN: 15 mg/dL (ref 6–23)
Creatinine, Ser: 0.8 mg/dL (ref 0.50–1.35)
GFR calc Af Amer: >90 mL/min (ref >90)
Glucose, Bld: 116 mg/dL — ABNORMAL HIGH (ref 70–99)
Total Bilirubin: 0.3 mg/dL (ref 0.3–1.2)
Total Protein: 7.3 g/dL (ref 6.0–8.3)

## 2012-03-08 MED ORDER — LORAZEPAM 2 MG/ML IJ SOLN
1.0000 mg | Freq: Once | INTRAMUSCULAR | Status: AC
  Administered 2012-03-08: 1 mg via INTRAVENOUS
  Filled 2012-03-08: qty 1

## 2012-03-08 NOTE — ED Notes (Signed)
Wife states that she heard pt having seizure awaking her from her sleep. Lasted less than a minute per wife. Pt hasn't had a seizure in a year and it is on meds (see PTA meds). Pt was alert but disoriented upon arrival of EMS and therefore was brought in. He doesn't recall the event. [place], [employer]

## 2012-03-08 NOTE — ED Notes (Signed)
Pts wife can be contacted [phone] but is unable to come to the hospital as she is sick at home [place], [place]

## 2012-03-08 NOTE — ED Provider Notes (Signed)
History     CSN: [id number]  Arrival date & time 03/08/12  0424   First MD Initiated Contact with Patient 03/08/12 [phone]      Chief Complaint  Patient presents with  . Seizures     The history is provided by the patient.   the patient had a witnessed seizure this evening.  He was noted by his wife to be postictal.  Patient takes Dilantin for seizures as been approximately one year since his last seizure.  Compliant with his medications.  The patient was initially postictal and is now returned to baseline mental status.  The patient's family is in the room and CT is acting normal at this time.  No headache.  He denies nausea and vomiting.  No neck pain.  He denies fevers and chills.  He's had no recent illness.  He denies weakness of his upper lower extremities.  Past Medical History  Diagnosis Date  . Coronary artery disease   . Shortness of breath   . Seizures   . Hypertension   . Pneumonia     Past Surgical History  Procedure Date  . Cardiac catheterization   . Coronary angioplasty with stent placement 07/22/2011  . Cardiac valve surgery   . Coronary artery bypass graft     History reviewed. No pertinent family history.  History  Substance Use Topics  . Smoking status: Former Smoker -- 0.5 packs/day for 18 years    Types: Cigarettes    Quit date: 06/24/2011  . Smokeless tobacco: Never Used  . Alcohol Use: Yes     occasional beer, 1-2 per week      Review of Systems  All other systems reviewed and are negative.    Allergies  Review of patient's allergies indicates no known allergies.  Home Medications   Current Outpatient Rx  Name Route Sig Dispense Refill  . AMLODIPINE-OLMESARTAN 10-40 MG PO TABS Oral Take 1 tablet by mouth daily.    . ASPIRIN 325 MG PO TBEC Oral Take 325 mg by mouth daily.    . BUPROPION HCL ER (SR) 150 MG PO TB12 Oral Take 150 mg by mouth 2 (two) times daily.    Marland Kitchen CLOPIDOGREL BISULFATE 75 MG PO TABS Oral Take 1 tablet (75 mg total) by  mouth daily with breakfast. 30 tablet 1  . NEBIVOLOL HCL 10 MG PO TABS Oral Take 1 tablet (10 mg total) by mouth daily. 30 tablet 6  . PHENYTOIN SODIUM EXTENDED 100 MG PO CAPS Oral Take 100 mg by mouth 2 (two) times daily.    Marland Kitchen PRAVASTATIN SODIUM 20 MG PO TABS Oral Take 20 mg by mouth daily.    Marland Kitchen TERAZOSIN HCL 5 MG PO CAPS Oral Take 5 mg by mouth at bedtime.      BP 156/88  Pulse 63  Temp 98.6 F (37 C) (Oral)  Resp 16  SpO2 97%  Physical Exam  Nursing note and vitals reviewed. Constitutional: He is oriented to person, place, and time. He appears well-developed and well-nourished.  HENT:  Head: Normocephalic and atraumatic.  Eyes: EOM are normal. Pupils are equal, round, and reactive to light.  Neck: Normal range of motion.  Cardiovascular: Normal rate, regular rhythm, normal heart sounds and intact distal pulses.   Pulmonary/Chest: Effort normal and breath sounds normal. No respiratory distress.  Abdominal: Soft. He exhibits no distension. There is no tenderness.  Musculoskeletal: Normal range of motion.  Neurological: He is alert and oriented to person, place, and time.  5/5 strength in major muscle groups of  bilateral upper and lower extremities. Speech normal. No facial asymetry.   Skin: Skin is warm and dry.  Psychiatric: He has a normal mood and affect. Judgment normal.    ED Course  Procedures (including critical care time)  Labs Reviewed  COMPREHENSIVE METABOLIC PANEL - Abnormal; Notable for the following:    Sodium 134 (*)     Glucose, Bld 116 (*)     Calcium 8.3 (*)     Albumin 3.2 (*)     AST 94 (*)     ALT 95 (*)     All other components within normal limits  CBC - Abnormal; Notable for the following:    HCT 38.4 (*)     All other components within normal limits  PHENYTOIN LEVEL, TOTAL   No results found.   1. Seizure       MDM  Therapeutic Dilantin level.  The patient is well-appearing at this time.  No indication for imaging.  This is  likely breakthrough seizure for a gentleman with known epilepsy.  PCP and neurology followup.  Instructed to return to the ER for new or worsening symptoms        Lyanne Co, MD 03/08/12 [phone]

## 2012-03-08 NOTE — ED Notes (Signed)
Pt's wife called. Sts she can come get pt but cannot come inside. Will discharge pt to lobby to wait for ride.

## 2012-03-08 NOTE — ED Notes (Signed)
[id number]<AV> Expected date:03/08/12<BR> Expected time: 4:03 AM<BR> Means of arrival:Ambulance<BR> Comments:<BR> Seizure

## 2013-11-11 ENCOUNTER — Emergency Department (HOSPITAL_COMMUNITY): Payer: Medicare Other

## 2013-11-11 ENCOUNTER — Encounter (HOSPITAL_COMMUNITY): Payer: Self-pay | Admitting: Emergency Medicine

## 2013-11-11 ENCOUNTER — Inpatient Hospital Stay (HOSPITAL_COMMUNITY)
Admission: EM | Admit: 2013-11-11 | Discharge: 2013-11-13 | DRG: 292 | Disposition: A | Discharge status: Home / Self Care | Payer: Medicare Other | Attending: Cardiology | Admitting: Cardiology

## 2013-11-11 DIAGNOSIS — Z8249 Family history of ischemic heart disease and other diseases of the circulatory system: Secondary | ICD-10-CM

## 2013-11-11 DIAGNOSIS — I5033 Acute on chronic diastolic (congestive) heart failure: Secondary | ICD-10-CM | POA: Diagnosis present

## 2013-11-11 DIAGNOSIS — I509 Heart failure, unspecified: Secondary | ICD-10-CM | POA: Diagnosis present

## 2013-11-11 DIAGNOSIS — Z87891 Personal history of nicotine dependence: Secondary | ICD-10-CM

## 2013-11-11 DIAGNOSIS — J449 Chronic obstructive pulmonary disease, unspecified: Secondary | ICD-10-CM | POA: Diagnosis present

## 2013-11-11 DIAGNOSIS — G40909 Epilepsy, unspecified, not intractable, without status epilepticus: Secondary | ICD-10-CM | POA: Diagnosis present

## 2013-11-11 DIAGNOSIS — G40802 Other epilepsy, not intractable, without status epilepticus: Secondary | ICD-10-CM | POA: Diagnosis present

## 2013-11-11 DIAGNOSIS — R7309 Other abnormal glucose: Secondary | ICD-10-CM | POA: Diagnosis present

## 2013-11-11 DIAGNOSIS — J4489 Other specified chronic obstructive pulmonary disease: Secondary | ICD-10-CM | POA: Diagnosis present

## 2013-11-11 DIAGNOSIS — I2510 Atherosclerotic heart disease of native coronary artery without angina pectoris: Secondary | ICD-10-CM | POA: Diagnosis present

## 2013-11-11 DIAGNOSIS — Z951 Presence of aortocoronary bypass graft: Secondary | ICD-10-CM

## 2013-11-11 DIAGNOSIS — R32 Unspecified urinary incontinence: Secondary | ICD-10-CM | POA: Diagnosis present

## 2013-11-11 DIAGNOSIS — G40309 Generalized idiopathic epilepsy and epileptic syndromes, not intractable, without status epilepticus: Secondary | ICD-10-CM | POA: Diagnosis present

## 2013-11-11 DIAGNOSIS — I110 Hypertensive heart disease with heart failure: Principal | ICD-10-CM | POA: Diagnosis present

## 2013-11-11 DIAGNOSIS — Z9119 Patient's noncompliance with other medical treatment and regimen: Secondary | ICD-10-CM

## 2013-11-11 DIAGNOSIS — Z8673 Personal history of transient ischemic attack (TIA), and cerebral infarction without residual deficits: Secondary | ICD-10-CM

## 2013-11-11 DIAGNOSIS — Z91199 Patient's noncompliance with other medical treatment and regimen due to unspecified reason: Secondary | ICD-10-CM

## 2013-11-11 DIAGNOSIS — I739 Peripheral vascular disease, unspecified: Secondary | ICD-10-CM | POA: Diagnosis present

## 2013-11-11 DIAGNOSIS — Z9861 Coronary angioplasty status: Secondary | ICD-10-CM

## 2013-11-11 DIAGNOSIS — I160 Hypertensive urgency: Secondary | ICD-10-CM | POA: Diagnosis present

## 2013-11-11 DIAGNOSIS — I501 Left ventricular failure: Secondary | ICD-10-CM

## 2013-11-11 DIAGNOSIS — Z7982 Long term (current) use of aspirin: Secondary | ICD-10-CM

## 2013-11-11 LAB — CREATININE, SERUM
CREATININE: 0.85 mg/dL (ref 0.50–1.35)
GFR calc Af Amer: >90 mL/min (ref >90)
GFR calc non Af Amer: 87 mL/min — ABNORMAL LOW (ref >90)

## 2013-11-11 LAB — CBC WITH DIFFERENTIAL/PLATELET
BASOS ABS: 0 10*3/uL (ref 0.0–0.1)
BASOS PCT: 0 % (ref 0–1)
EOS PCT: 1 % (ref 0–5)
Eosinophils Absolute: 0.1 10*3/uL (ref 0.0–0.7)
HEMATOCRIT: 34 % — AB (ref 39.0–52.0)
Hemoglobin: 11.6 g/dL — ABNORMAL LOW (ref 13.0–17.0)
Lymphocytes Relative: 29 % (ref 12–46)
Lymphs Abs: 2 10*3/uL (ref 0.7–4.0)
MCH: 29.5 pg (ref 26.0–34.0)
MCHC: 34.1 g/dL (ref 30.0–36.0)
MCV: 86.5 fL (ref 78.0–100.0)
MONO ABS: 0.4 10*3/uL (ref 0.1–1.0)
Monocytes Relative: 5 % (ref 3–12)
Neutro Abs: 4.5 10*3/uL (ref 1.7–7.7)
Neutrophils Relative %: 65 % (ref 43–77)
Platelets: 156 10*3/uL (ref 150–400)
RBC: 3.93 MIL/uL — ABNORMAL LOW (ref 4.22–5.81)
RDW: 14.7 % (ref 11.5–15.5)
WBC: 6.9 10*3/uL (ref 4.0–10.5)

## 2013-11-11 LAB — BASIC METABOLIC PANEL
BUN: 13 mg/dL (ref 6–23)
CALCIUM: 8.9 mg/dL (ref 8.4–10.5)
CO2: 26 mEq/L (ref 19–32)
CREATININE: 0.9 mg/dL (ref 0.50–1.35)
Chloride: 99 mEq/L (ref 96–112)
GFR calc non Af Amer: 85 mL/min — ABNORMAL LOW (ref >90)
Glucose, Bld: 118 mg/dL — ABNORMAL HIGH (ref 70–99)
Potassium: 3.9 mEq/L (ref 3.7–5.3)
Sodium: 139 mEq/L (ref 137–147)

## 2013-11-11 LAB — CBC
HCT: 34.8 % — ABNORMAL LOW (ref 39.0–52.0)
HEMOGLOBIN: 11.9 g/dL — AB (ref 13.0–17.0)
MCH: 30.1 pg (ref 26.0–34.0)
MCHC: 34.2 g/dL (ref 30.0–36.0)
MCV: 88.1 fL (ref 78.0–100.0)
PLATELETS: 172 10*3/uL (ref 150–400)
RBC: 3.95 MIL/uL — ABNORMAL LOW (ref 4.22–5.81)
RDW: 15.1 % (ref 11.5–15.5)
WBC: 5 10*3/uL (ref 4.0–10.5)

## 2013-11-11 LAB — PRO B NATRIURETIC PEPTIDE: Pro B Natriuretic peptide (BNP): 1855 pg/mL — ABNORMAL HIGH (ref 0–125)

## 2013-11-11 LAB — TROPONIN I

## 2013-11-11 MED ORDER — AMLODIPINE-OLMESARTAN 10-40 MG PO TABS: 1.0000 | ORAL_TABLET | Freq: Every day | ORAL | Status: DC

## 2013-11-11 MED ORDER — SIMVASTATIN 20 MG PO TABS
20.0000 mg | ORAL_TABLET | Freq: Every day | ORAL | Status: DC
  Administered 2013-11-11 – 2013-11-12 (×2): 20 mg via ORAL
  Filled 2013-11-11 (×3): qty 1

## 2013-11-11 MED ORDER — PHENYTOIN SODIUM EXTENDED 100 MG PO CAPS
100.0000 mg | ORAL_CAPSULE | Freq: Two times a day (BID) | ORAL | Status: DC
Start: 2013-11-11 — End: 2013-11-13
  Administered 2013-11-11 – 2013-11-13 (×5): 100 mg via ORAL
  Filled 2013-11-11 (×6): qty 1

## 2013-11-11 MED ORDER — HEPARIN SODIUM (PORCINE) 5000 UNIT/ML IJ SOLN
5000.0000 [IU] | Freq: Three times a day (TID) | INTRAMUSCULAR | Status: DC
  Administered 2013-11-11 – 2013-11-13 (×6): 5000 [IU] via SUBCUTANEOUS
  Filled 2013-11-11 (×8): qty 1

## 2013-11-11 MED ORDER — IRBESARTAN 300 MG PO TABS
300.0000 mg | ORAL_TABLET | Freq: Every day | ORAL | Status: DC
  Administered 2013-11-11 – 2013-11-13 (×3): 300 mg via ORAL
  Filled 2013-11-11 (×3): qty 1

## 2013-11-11 MED ORDER — NITROGLYCERIN IN D5W 200-5 MCG/ML-% IV SOLN
5.0000 ug/min | INTRAVENOUS | Status: DC
  Administered 2013-11-11: 5 ug/min via INTRAVENOUS
  Filled 2013-11-11: qty 250

## 2013-11-11 MED ORDER — FUROSEMIDE 10 MG/ML IJ SOLN
40.0000 mg | Freq: Once | INTRAMUSCULAR | Status: AC
  Administered 2013-11-11: 40 mg via INTRAVENOUS
  Filled 2013-11-11: qty 4

## 2013-11-11 MED ORDER — ISOSORBIDE MONONITRATE ER 60 MG PO TB24
120.0000 mg | ORAL_TABLET | Freq: Every day | ORAL | Status: DC
  Administered 2013-11-11 – 2013-11-13 (×3): 120 mg via ORAL
  Filled 2013-11-11 (×3): qty 2

## 2013-11-11 MED ORDER — POTASSIUM CHLORIDE CRYS ER 20 MEQ PO TBCR
40.0000 meq | EXTENDED_RELEASE_TABLET | Freq: Two times a day (BID) | ORAL | Status: AC
  Administered 2013-11-11 (×2): 40 meq via ORAL
  Filled 2013-11-11 (×2): qty 2

## 2013-11-11 MED ORDER — AMLODIPINE BESYLATE 10 MG PO TABS
10.0000 mg | ORAL_TABLET | Freq: Every day | ORAL | Status: DC
  Administered 2013-11-11 – 2013-11-13 (×3): 10 mg via ORAL
  Filled 2013-11-11 (×3): qty 1

## 2013-11-11 MED ORDER — TERAZOSIN HCL 5 MG PO CAPS
5.0000 mg | ORAL_CAPSULE | Freq: Every day | ORAL | Status: DC
  Administered 2013-11-11 – 2013-11-12 (×2): 5 mg via ORAL
  Filled 2013-11-11 (×3): qty 1

## 2013-11-11 MED ORDER — CHLORHEXIDINE GLUCONATE 0.12 % MT SOLN
15.0000 mL | Freq: Two times a day (BID) | OROMUCOSAL | Status: DC
  Administered 2013-11-11 – 2013-11-13 (×4): 15 mL via OROMUCOSAL
  Filled 2013-11-11 (×6): qty 15

## 2013-11-11 MED ORDER — SODIUM CHLORIDE 0.9 % IV SOLN
Freq: Once | INTRAVENOUS | Status: AC
  Administered 2013-11-11: 09:00:00 via INTRAVENOUS

## 2013-11-11 MED ORDER — BIOTENE DRY MOUTH MT LIQD
15.0000 mL | Freq: Two times a day (BID) | OROMUCOSAL | Status: DC
  Administered 2013-11-11 – 2013-11-13 (×4): 15 mL via OROMUCOSAL

## 2013-11-11 MED ORDER — ALBUTEROL SULFATE (2.5 MG/3ML) 0.083% IN NEBU: 2.5000 mg | INHALATION_SOLUTION | Freq: Four times a day (QID) | RESPIRATORY_TRACT | Status: DC | PRN

## 2013-11-11 MED ORDER — ASPIRIN EC 81 MG PO TBEC
81.0000 mg | DELAYED_RELEASE_TABLET | Freq: Every day | ORAL | Status: DC
  Administered 2013-11-11 – 2013-11-13 (×3): 81 mg via ORAL
  Filled 2013-11-11 (×3): qty 1

## 2013-11-11 MED ORDER — NEBIVOLOL HCL 10 MG PO TABS
10.0000 mg | ORAL_TABLET | Freq: Every day | ORAL | Status: DC
  Administered 2013-11-11 – 2013-11-12 (×2): 10 mg via ORAL
  Filled 2013-11-11 (×2): qty 1

## 2013-11-11 MED ORDER — POLYVINYL ALCOHOL 1.4 % OP SOLN
2.0000 [drp] | Freq: Two times a day (BID) | OPHTHALMIC | Status: DC
  Administered 2013-11-11 – 2013-11-13 (×5): 2 [drp] via OPHTHALMIC
  Filled 2013-11-11: qty 15

## 2013-11-11 NOTE — Care Management Note (Signed)
    Page 1 of 1   11/11/2013     4:19:17 PM CARE MANAGEMENT NOTE 11/11/2013  Patient:  Andre Guzman, Andre Guzman   Account Number:  [id number]  Date Initiated:  11/11/2013  Documentation initiated by:  [facility]  Subjective/Objective Assessment:   68 yo M with PMH of CAD, COPD and seizures who presented to the ER via EMS for acute onset of SOB, with associated tachypnea and wheezing.//Home with spouse.     Action/Plan:   IV Lasix, monitor I/O, recheck BMet. //Access for [facility] services.   Anticipated DC Date:  11/14/2013   Anticipated DC Plan:  [place]         [facility] Choice  HOME HEALTH   Choice offered to / List presented to:             Status of service:  In process, will continue to follow Medicare Important Message given?   (If response is "NO", the following Medicare IM given date fields will be blank) Date Medicare IM given:   Date Additional Medicare IM given:    Discharge Disposition:    Per UR Regulation:  Reviewed for med. necessity/level of care/duration of stay  If discussed at [place] of Stay Meetings, dates discussed:    Comments:

## 2013-11-11 NOTE — ED Notes (Signed)
PER EMS: pt woke up this morning feeling SOB, upon EMS pts RR-32 and wheezing, decreased lung sounds, 89% RA. Given 10mg  albuterol and 1mg  of atrovent and reports he feels his breathing is back to normal, decreased work of breathing.pt reports he thinks there has been some fluid accumulation to lower extremities bilaterally. BP-192/102, HR-80, O2-98% with breathing txt. Dr. Sharol Given at bedside.

## 2013-11-11 NOTE — ED Provider Notes (Signed)
CSN: [id number]     Arrival date & time 11/11/13  0546 History   First MD Initiated Contact with Patient 11/11/13 0559     Chief Complaint  Patient presents with  . Shortness of Breath     (Consider location/radiation/quality/duration/timing/severity/associated sxs/prior Treatment) HPI 68 year old male presents to emergency department via EMS from his home with complaint of shortness of breath.  Tachypnea and wheezing with EMS.  Reports history of COPD.  Denies CHF.  Patient reports 3 days of swelling in his lower extremities.  Patient was hypertensive with EMS.  Patient feels better after DuoNeb.  He denies any chest pain. Past Medical History  Diagnosis Date  . Coronary artery disease   . Shortness of breath   . Seizures   . Hypertension   . Pneumonia    Past Surgical History  Procedure Laterality Date  . Cardiac catheterization    . Coronary angioplasty with stent placement  07/22/2011  . Cardiac valve surgery    . Coronary artery bypass graft     No family history on file. History  Substance Use Topics  . Smoking status: Former Smoker -- 0.50 packs/day for 18 years    Types: Cigarettes    Quit date: 06/24/2011  . Smokeless tobacco: Never Used  . Alcohol Use: Yes     Comment: occasional beer, 1-2 per week    Review of Systems   See History of Present Illness; otherwise all other systems are reviewed and negative  Allergies  Review of patient's allergies indicates no known allergies.  Home Medications   Prior to Admission medications   Medication Sig Start Date End Date Taking? Authorizing Provider  amLODipine-olmesartan (AZOR) 10-40 MG per tablet Take 1 tablet by mouth daily.    Historical Provider, MD  aspirin 325 MG EC tablet Take 325 mg by mouth daily.    Historical Provider, MD  buPROPion (WELLBUTRIN SR) 150 MG 12 hr tablet Take 150 mg by mouth 2 (two) times daily.    Historical Provider, MD  nebivolol (BYSTOLIC) 10 MG tablet Take 1 tablet (10 mg total) by  mouth daily. 10/31/11 10/30/12  Laverda Page, MD  phenytoin (DILANTIN) 100 MG ER capsule Take 100 mg by mouth 2 (two) times daily.    Historical Provider, MD  pravastatin (PRAVACHOL) 20 MG tablet Take 20 mg by mouth daily.    Historical Provider, MD  terazosin (HYTRIN) 5 MG capsule Take 5 mg by mouth at bedtime.    Historical Provider, MD   BP 155/92  Pulse 76  Temp(Src) 98.2 F (36.8 C)  Resp 19  SpO2 98% Physical Exam  Nursing note and vitals reviewed. Constitutional: He is oriented to person, place, and time. He appears well-developed and well-nourished.  HENT:  Head: Normocephalic and atraumatic.  Nose: Nose normal.  Mouth/Throat: Oropharynx is clear and moist.  Eyes: Conjunctivae and EOM are normal. Pupils are equal, round, and reactive to light.  Neck: Normal range of motion. Neck supple. No JVD present. No tracheal deviation present. No thyromegaly present.  Cardiovascular: Normal rate, regular rhythm, normal heart sounds and intact distal pulses.  Exam reveals no gallop and no friction rub.   No murmur heard. Pulmonary/Chest: Effort normal. No stridor. No respiratory distress. He has wheezes. He has rales. He exhibits no tenderness.  Abdominal: Soft. Bowel sounds are normal. He exhibits distension (Abdominal distention with mild amount of edema to abdomen). He exhibits no mass. There is no tenderness. There is no rebound and no guarding.  Musculoskeletal: Normal range of motion. He exhibits edema (2+). He exhibits no tenderness.  Lymphadenopathy:    He has no cervical adenopathy.  Neurological: He is alert and oriented to person, place, and time. He exhibits normal muscle tone. Coordination normal.  Skin: Skin is warm and dry. No rash noted. No erythema. No pallor.  Psychiatric: He has a normal mood and affect. His behavior is normal. Judgment and thought content normal.    ED Course  Procedures (including critical care time)  CRITICAL CARE Performed by: Kalman Drape Total critical care time: 90 min Critical care time was exclusive of separately billable procedures and treating other patients. Critical care was necessary to treat or prevent imminent or life-threatening deterioration. Critical care was time spent personally by me on the following activities: development of treatment plan with patient and/or surrogate as well as nursing, discussions with consultants, evaluation of patient's response to treatment, examination of patient, obtaining history from patient or surrogate, ordering and performing treatments and interventions, ordering and review of laboratory studies, ordering and review of radiographic studies, pulse oximetry and re-evaluation of patient's condition.  Labs Review Labs Reviewed  PRO B NATRIURETIC PEPTIDE - Abnormal; Notable for the following:    Pro B Natriuretic peptide (BNP) 1855.0 (*)    All other components within normal limits  CBC WITH DIFFERENTIAL - Abnormal; Notable for the following:    RBC 3.93 (*)    Hemoglobin 11.6 (*)    HCT 34.0 (*)    All other components within normal limits  BASIC METABOLIC PANEL - Abnormal; Notable for the following:    Glucose, Bld 118 (*)    GFR calc non Af Amer 85 (*)    All other components within normal limits  TROPONIN I    Imaging Review Dg Chest Port 1 View  11/11/2013   CLINICAL DATA:  Shortness of breath, cough and congestion.  EXAM: PORTABLE CHEST - 1 VIEW  COMPARISON:  10/30/2011  FINDINGS: Postoperative changes in the mediastinum. Borderline heart size with increased pulmonary vascularity. Interstitial changes in the lungs probably represent edema. Small bilateral pleural effusions. Focal infiltration or atelectasis in the left lung base behind the heart. No pneumothorax. Metallic foreign body projected over in the right axillary region.  IMPRESSION: Probable pulmonary vascular congestion with interstitial edema or infiltration. Small bilateral pleural effusions. Infiltration or  atelectasis in the left lung base.   Electronically Signed   By: Lucienne Capers M.D.   On: 11/11/2013 06:44     EKG Interpretation   Date/Time:  Friday November 11 2013 05:57:13 EDT Ventricular Rate:  74 PR Interval:  339 QRS Duration: 102 QT Interval:  389 QTC Calculation: 432 R Axis:   71 Text Interpretation:  Sinus rhythm Prolonged PR interval Consider left  atrial enlargement Probable LVH with secondary repol abnrm Confirmed by  OTTER  MD, OLGA ([id number]) on 11/11/2013 6:00:28 AM      MDM   Final diagnoses:  Hypertensive urgency  Pulmonary edema with congestive heart failure    68 year old male with shortness of breath this morning.  Wheezing noted, also rales at bases.  Concern for hypertensive urgency/pulmonary edema, or CHF exacerbation.  Plan for chest x-ray, lab work.  If need be we'll start IV nitroglycerin drip and Lasix.    Kalman Drape, MD 11/11/13 [phone]
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
## 2013-11-11 NOTE — Progress Notes (Signed)
Called Dr, Einar Gip  and obtained order for Andre Guzman for pt.  [name], Therapist, sports.

## 2013-11-11 NOTE — Progress Notes (Signed)
Verified with Lattie Haw Pharmacist, that pt nitro gtt is d/c and to start on  Imdur today.  Informed that is correct.  [name], Therapist, sports.

## 2013-11-12 DIAGNOSIS — G40909 Epilepsy, unspecified, not intractable, without status epilepticus: Secondary | ICD-10-CM

## 2013-11-12 DIAGNOSIS — I10 Essential (primary) hypertension: Secondary | ICD-10-CM

## 2013-11-12 LAB — GLUCOSE, CAPILLARY: Glucose-Capillary: 107 mg/dL — ABNORMAL HIGH (ref 70–99)

## 2013-11-12 LAB — BASIC METABOLIC PANEL
BUN: 13 mg/dL (ref 6–23)
CALCIUM: 8.4 mg/dL (ref 8.4–10.5)
CO2: 27 meq/L (ref 19–32)
Chloride: 100 mEq/L (ref 96–112)
Creatinine, Ser: 0.86 mg/dL (ref 0.50–1.35)
GFR calc Af Amer: >90 mL/min (ref >90)
GFR calc non Af Amer: 86 mL/min — ABNORMAL LOW (ref >90)
GLUCOSE: 89 mg/dL (ref 70–99)
Potassium: 4.5 mEq/L (ref 3.7–5.3)
SODIUM: 140 meq/L (ref 137–147)

## 2013-11-12 LAB — ALBUMIN: ALBUMIN: 3.4 g/dL — AB (ref 3.5–5.2)

## 2013-11-12 LAB — PRO B NATRIURETIC PEPTIDE: Pro B Natriuretic peptide (BNP): 2217 pg/mL — ABNORMAL HIGH (ref 0–125)

## 2013-11-12 LAB — PHENYTOIN LEVEL, TOTAL: Phenytoin Lvl: 18.2 ug/mL (ref 10.0–20.0)

## 2013-11-12 LAB — MAGNESIUM: MAGNESIUM: 1.8 mg/dL (ref 1.5–2.5)

## 2013-11-12 LAB — TROPONIN I: Troponin I: <0.3 ng/mL (ref <0.30)

## 2013-11-12 MED ORDER — IPRATROPIUM-ALBUTEROL 20-100 MCG/ACT IN AERS: 2.0000 | INHALATION_SPRAY | Freq: Four times a day (QID) | RESPIRATORY_TRACT | Status: DC | PRN

## 2013-11-12 MED ORDER — LAMOTRIGINE 25 MG PO TABS
25.0000 mg | ORAL_TABLET | Freq: Two times a day (BID) | ORAL | Status: DC
  Administered 2013-11-12 – 2013-11-13 (×3): 25 mg via ORAL
  Filled 2013-11-12 (×4): qty 1

## 2013-11-12 MED ORDER — IPRATROPIUM-ALBUTEROL 0.5-2.5 (3) MG/3ML IN SOLN: 3.0000 mL | Freq: Four times a day (QID) | RESPIRATORY_TRACT | Status: DC | PRN

## 2013-11-12 MED ORDER — TRIAMTERENE-HCTZ 37.5-25 MG PO TABS
1.0000 | ORAL_TABLET | Freq: Every day | ORAL | Status: DC
  Administered 2013-11-12 – 2013-11-13 (×2): 1 via ORAL
  Filled 2013-11-12 (×2): qty 1

## 2013-11-12 MED ORDER — FUROSEMIDE 10 MG/ML IJ SOLN
40.0000 mg | Freq: Once | INTRAMUSCULAR | Status: AC
  Administered 2013-11-12: 40 mg via INTRAVENOUS
  Filled 2013-11-12: qty 4

## 2013-11-12 MED ORDER — NEBIVOLOL HCL 10 MG PO TABS
20.0000 mg | ORAL_TABLET | Freq: Every day | ORAL | Status: DC
  Administered 2013-11-13: 20 mg via ORAL
  Filled 2013-11-12: qty 2

## 2013-11-12 NOTE — Progress Notes (Signed)
MEDICATION RELATED CONSULT NOTE - INITIAL   Pharmacy Consult for Dilantin Indication: Seizures  No Known Allergies  Patient Measurements: Height: 5\' 6"  (167.6 cm) Weight: 157 lb 0.9 oz (71.24 kg) (Scale B) IBW/kg (Calculated) : 63.8 Adjusted Body Weight:   Vital Signs: Temp: 98.4 F (36.9 C) (06/27 0900) Temp src: Oral (06/27 0900) BP: 132/53 mmHg (06/27 0900) Pulse Rate: 70 (06/27 0900) Intake/Output from previous day: 06/26 0701 - 06/27 0700 In: 720 [P.O.:720] Out: 3726 [Urine:3725; Stool:1] Intake/Output from this shift: Total I/O In: 600 [P.O.:600] Out: 325 [Urine:325]  Labs:  Recent Labs  11/11/13 0610 11/11/13 1440 11/12/13 0502 11/12/13 1544  WBC 6.9 5.0  --   --   HGB 11.6* 11.9*  --   --   HCT 34.0* 34.8*  --   --   PLT 156 172  --   --   CREATININE 0.90 0.85 0.86  --   MG  --   --  1.8  --   ALBUMIN  --   --   --  3.4*   Estimated Creatinine Clearance: 73.2 ml/min (by C-G formula based on Cr of 0.86).   Microbiology: No results found for this or any previous visit (from the past 720 hour(s)).  Medical History: Past Medical History  Diagnosis Date  . Coronary artery disease   . Shortness of breath   . Seizures   . Hypertension   . Pneumonia     Medications:  Prescriptions prior to admission  Medication Sig Dispense Refill  . albuterol (PROVENTIL HFA;VENTOLIN HFA) 108 (90 BASE) MCG/ACT inhaler Inhale 1-2 puffs into the lungs every 6 (six) hours as needed for wheezing or shortness of breath.      Marland Kitchen amLODipine-olmesartan (AZOR) 10-40 MG per tablet Take 1 tablet by mouth daily.      Marland Kitchen aspirin EC 81 MG tablet Take 81 mg by mouth daily.      . nebivolol (BYSTOLIC) 10 MG tablet Take 1 tablet (10 mg total) by mouth daily.  30 tablet  6  . phenytoin (DILANTIN) 100 MG ER capsule Take 100 mg by mouth 2 (two) times daily.      . Polyvinyl Alcohol-Povidone (REFRESH OP) Place 1 drop into both eyes daily as needed (dry eyes).      . pravastatin  (PRAVACHOL) 20 MG tablet Take 20 mg by mouth daily.      Marland Kitchen terazosin (HYTRIN) 5 MG capsule Take 5 mg by mouth at bedtime.        Assessment: 68yom on Dilantin for seizures. Per notes, patient likely had a seizure last night. Dilantin level was checked this AM and reported as 18.2. Albumin was checked later today and reported as 3.4. Therefore, corrected Dilantin level ~23 which is slightly above goal range of 10-20. Neuro was consulted and recommended to continue Dilantin 100mg  BID (PTA regimen) and added Lamictal. Recommend to recheck Dilantin level with concomitant albumin in 3-5 days to ensure concentration not increasing and hopefully trend back to goal range.   Goal of Therapy:  10-20 mcg/ml  Plan:  1. Continue Dilantin 100mg  BID per Neuro recommendations 2. Recommend to check follow-up Dilantin level with albumin level in 3-5 days to trend concentration 3. Monitor seizure activity and s/sx of toxicity  Earleen Newport [phone] 11/12/2013,5:48 PM

## 2013-11-12 NOTE — Progress Notes (Signed)
The rapid response nurse came to assess the patient at [facility].  During the assessment, the patient stated that he feels similar to the way he feels after having a seizure.  The MD was text paged x1.  As of 0215, the patient is asleep and his bed alarm is on.  The call bell is within reach.

## 2013-11-12 NOTE — Significant Event (Signed)
Rapid Response Event Note  Overview: Time Called: 0033 Arrival Time: 0115 Event Type: Neurologic  Initial Focused Assessment: Called by patient's nurse requesting assistance with patient experiencing mental status changes. Per primary nurse, patient was found earlier wandering in hallway incontinent of urine with minimal verbal response. Upon arrival to patient's room, patient lying in bed, alert and oriented with clear speech. Patient following all commands and moving all extremities equally with no obvious deficits noted. Pupils equal and reactive to light accomodation bilaterally. Patient stated that he does not recall what happened earlier and that he feels "foggy" and "out of sorts." Vital signs as per noted in the chart.    Interventions:   Event Summary: Name of Physician Notified: Dr Albesa Seen at [facility] with Janett Billow, RN prior to my arrival)    at  paged again at 0125 to report Rapid Response assessment(awaiting return call).        Andre Guzman

## 2013-11-12 NOTE — Progress Notes (Signed)
Subjective:  Feels well this morning. States that his shortness breath is improved significantly. Had one episode of seizure last night. He states that he has not had a seizure.  Objective:  Vital Signs in the last 24 hours: Temp:  [97.9 F (36.6 C)-98.5 F (36.9 C)] 98.4 F (36.9 C) (06/27 0900) Pulse Rate:  [56-70] 70 (06/27 0900) Resp:  [14-20] 20 (06/27 0900) BP: (124-175)/(53-87) 132/53 mmHg (06/27 0900) SpO2:  [98 %-100 %] 100 % (06/27 0900) Weight:  [71.24 kg (157 lb 0.9 oz)] 71.24 kg (157 lb 0.9 oz) (06/27 0619)  Intake/Output from previous day: 06/26 0701 - 06/27 0700 In: 720 [P.O.:720] Out: 3726 [Urine:3725; Stool:1]  Physical Exam:   General appearance: alert, cooperative, appears stated age and no distress Eyes: negative findings: lids and lashes normal Neck: no adenopathy, no JVD, supple, symmetrical, trachea midline and thyroid not enlarged, symmetric, no tenderness/mass/nodules Neck: JVP - normal, carotids 2+= without bruits Resp: wheezes bilaterally Chest wall: no tenderness Cardio: regular rate and rhythm, S1, S2 normal, no murmur, click, rub or gallop GI: soft, non-tender; bowel sounds normal; no masses,  no organomegaly Extremities: edema 2+ left shin and left ankle edema and trace right ankle edema, improved from yesterday when it was 2+ below the knee bilaterally.    Lab Results: BMP  Recent Labs  11/11/13 0610 11/11/13 1440 11/12/13 0502  NA 139  --  140  K 3.9  --  4.5  CL 99  --  100  CO2 26  --  27  GLUCOSE 118*  --  89  BUN 13  --  13  CREATININE 0.90 0.85 0.86  CALCIUM 8.9  --  8.4  GFRNONAA 85* 87* 86*  GFRAA >90 >90 >90    CBC  Recent Labs Lab 11/11/13 0610 11/11/13 1440  WBC 6.9 5.0  RBC 3.93* 3.95*  HGB 11.6* 11.9*  HCT 34.0* 34.8*  PLT 156 172  MCV 86.5 88.1  MCH 29.5 30.1  MCHC 34.1 34.2  RDW 14.7 15.1  LYMPHSABS 2.0  --   MONOABS 0.4  --   EOSABS 0.1  --   BASOSABS 0.0  --     HEMOGLOBIN A1C No results  found for this basename: HGBA1C, MPG    Cardiac Panel (last 3 results)  Recent Labs  11/11/13 1208 11/11/13 1700 11/11/13 2319  TROPONINI <0.30 <0.30 <0.30    BNP (last 3 results)  Recent Labs  11/11/13 0609 11/11/13 2319  PROBNP 1855.0* 2217.0*    TSH No results found for this basename: TSH,  in the last 8760 hours  CHOLESTEROL No results found for this basename: CHOL,  in the last 8760 hours  Hepatic Function Panel No results found for this basename: PROT, ALBUMIN, AST, ALT, ALKPHOS, BILITOT, BILIDIR, IBILI,  in the last 8760 hours  Imaging: Imaging results have been reviewed  Cardiac Studies:  EKG: 11/12/2013: Normal sinus rhythm at a rate of 60 beats per minute, normal axis, left atrial enlargement, left ventricular hypertrophy with repolarization abnormality. No significant change from prior EKG.  Assessment/Plan:  1. Acute on chronic diastolic heart failure secondary to hypertension with hypertensive heart disease and also noncompliance with salt intake. 2. Recurrent episode of seizure, first episode in many years. Neurology has been consulted. Dilantin levels normal.  3. Hypertension 4. Tobacco use disorder 5. 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  Recommendation: Increase diastolic from 10 mg to 20 mg. Additional dose of IV furosemide today. I have  also added Maxzide 37. 5/25 mg 1 by mouth every morning.  His leg edema has improved significantly. Bibasilar crackles that were heard on initial admission no longer present. I will restart him back on albuterol for his COPD. Again discussed regarding smoking cessation, patient appears to be motivated this time.   Laverda Page, M.D. 11/12/2013, 12:05 PM [facility], [place] Pager: [phone] Office: [phone] If no answer: [phone]

## 2013-11-12 NOTE — Consult Note (Signed)
Reason for Consult: Recurrent seizure.  HPI:                                                                                                                                          Andre Guzman is an 68 y.o. male blister coronary artery disease, hypertension, COPD and brain tumor resection with seizure disorder who was admitted on 11/12/2014 for exacerbation of heart failure with increasing shortness of breath and pulmonary edema. Neurology consultation was obtained because patient likely experienced a recurrent seizure last night. He was found wandering in the hallway incontinent of urine and confused as well as minimally responsive verbally. Patient does not recall what happened. His been taking Dilantin 100 mg twice a day. His Dilantin level this morning was near the upper limit of normal range. He indicated this is the first seizure that he has experienced in about 2 years. His mental status subsequently returned to normal and has remained so.  Past Medical History  Diagnosis Date  . Coronary artery disease   . Shortness of breath   . Seizures   . Hypertension   . Pneumonia     Past Surgical History  Procedure Laterality Date  . Cardiac catheterization    . Coronary angioplasty with stent placement  07/22/2011  . Cardiac valve surgery    . Coronary artery bypass graft      History reviewed. No pertinent family history.  Social History:  reports that he quit smoking about 2 years ago. His smoking use included Cigarettes. He has a 9 pack-year smoking history. He has never used smokeless tobacco. He reports that he drinks alcohol. He reports that he does not use illicit drugs.  No Known Allergies  MEDICATIONS:                                                                                                                     I have reviewed the patient's current medications.   ROS:  History obtained from chart review and the patient  General ROS: negative for - chills, fatigue, fever, night sweats, weight gain or weight loss Psychological ROS: negative for - behavioral disorder, hallucinations, memory difficulties, mood swings or suicidal ideation Ophthalmic ROS: negative for - blurry vision, double vision, eye pain or loss of vision ENT ROS: negative for - epistaxis, nasal discharge, oral lesions, sore throat, tinnitus or vertigo Allergy and Immunology ROS: negative for - hives or itchy/watery eyes Hematological and Lymphatic ROS: negative for - bleeding problems, bruising or swollen lymph nodes Endocrine ROS: negative for - galactorrhea, hair pattern changes, polydipsia/polyuria or temperature intolerance Respiratory ROS: As noted in history of present illness Cardiovascular ROS: As noted in history of present illness Gastrointestinal ROS: negative for - abdominal pain, diarrhea, hematemesis, nausea/vomiting or stool incontinence Genito-Urinary ROS: negative for - dysuria, hematuria, incontinence or urinary frequency/urgency Musculoskeletal ROS: negative for - joint swelling or muscular weakness Neurological ROS: as noted in HPI Dermatological ROS: negative for rash and skin lesion changes   Blood pressure 132/53, pulse 70, temperature 98.4 F (36.9 C), temperature source Oral, resp. rate 20, height $RemoveBe'5\' 6"'stUwKLziy$  (1.676 m), weight 71.24 kg (157 lb 0.9 oz), SpO2 100.00%.   Neurologic Examination:                                                                                                      Mental Status: Alert, oriented, thought content appropriate.  Speech fluent without evidence of aphasia. Able to follow commands without difficulty. Cranial Nerves: II-Visual fields were normal. III/IV/VI-Pupils were equal and reacted. Extraocular movements were full and conjugate.    V/VII-no facial numbness and no facial  weakness. VIII-normal. X-normal speech and symmetrical palatal movement. Motor: 5/5 bilaterally with normal tone and bulk Deep Tendon Reflexes:  1+  and symmetric. Plantars: Flexor bilaterally Cerebellar: Normal finger-to-nose testing. Carotid auscultation: Normal  Lab Results  Component Value Date/Time   CHOL 150 10/30/2011  7:11 AM    Results for orders placed during the hospital encounter of 11/11/13 (from the past 48 hour(s))  PRO B NATRIURETIC PEPTIDE     Status: Abnormal   Collection Time    11/11/13  6:09 AM      Result Value Ref Range   Pro B Natriuretic peptide (BNP) 1855.0 (*) 0 - 125 pg/mL  TROPONIN I     Status: None   Collection Time    11/11/13  6:09 AM      Result Value Ref Range   Troponin I <0.30  <0.30 ng/mL   Comment:            Due to the release kinetics of cTnI,     a negative result within the first hours     of the onset of symptoms does not rule out     myocardial infarction with certainty.     If myocardial infarction is still suspected,     repeat the test at appropriate intervals.  CBC WITH DIFFERENTIAL     Status: Abnormal   Collection Time    11/11/13  6:10 AM  Result Value Ref Range   WBC 6.9  4.0 - 10.5 K/uL   RBC 3.93 (*) 4.22 - 5.81 MIL/uL   Hemoglobin 11.6 (*) 13.0 - 17.0 g/dL   HCT 34.0 (*) 39.0 - 52.0 %   MCV 86.5  78.0 - 100.0 fL   MCH 29.5  26.0 - 34.0 pg   MCHC 34.1  30.0 - 36.0 g/dL   RDW 14.7  11.5 - 15.5 %   Platelets 156  150 - 400 K/uL   Neutrophils Relative % 65  43 - 77 %   Neutro Abs 4.5  1.7 - 7.7 K/uL   Lymphocytes Relative 29  12 - 46 %   Lymphs Abs 2.0  0.7 - 4.0 K/uL   Monocytes Relative 5  3 - 12 %   Monocytes Absolute 0.4  0.1 - 1.0 K/uL   Eosinophils Relative 1  0 - 5 %   Eosinophils Absolute 0.1  0.0 - 0.7 K/uL   Basophils Relative 0  0 - 1 %   Basophils Absolute 0.0  0.0 - 0.1 K/uL  BASIC METABOLIC PANEL     Status: Abnormal   Collection Time    11/11/13  6:10 AM      Result Value Ref Range    Sodium 139  137 - 147 mEq/L   Potassium 3.9  3.7 - 5.3 mEq/L   Chloride 99  96 - 112 mEq/L   CO2 26  19 - 32 mEq/L   Glucose, Bld 118 (*) 70 - 99 mg/dL   BUN 13  6 - 23 mg/dL   Creatinine, Ser 0.90  0.50 - 1.35 mg/dL   Calcium 8.9  8.4 - 10.5 mg/dL   GFR calc non Af Amer 85 (*) >90 mL/min   GFR calc Af Amer >90  >90 mL/min   Comment: (NOTE)     The eGFR has been calculated using the CKD EPI equation.     This calculation has not been validated in all clinical situations.     eGFR's persistently <90 mL/min signify possible Chronic Kidney     Disease.  TROPONIN I     Status: None   Collection Time    11/11/13 12:08 PM      Result Value Ref Range   Troponin I <0.30  <0.30 ng/mL   Comment:            Due to the release kinetics of cTnI,     a negative result within the first hours     of the onset of symptoms does not rule out     myocardial infarction with certainty.     If myocardial infarction is still suspected,     repeat the test at appropriate intervals.  CBC     Status: Abnormal   Collection Time    11/11/13  2:40 PM      Result Value Ref Range   WBC 5.0  4.0 - 10.5 K/uL   RBC 3.95 (*) 4.22 - 5.81 MIL/uL   Hemoglobin 11.9 (*) 13.0 - 17.0 g/dL   HCT 34.8 (*) 39.0 - 52.0 %   MCV 88.1  78.0 - 100.0 fL   MCH 30.1  26.0 - 34.0 pg   MCHC 34.2  30.0 - 36.0 g/dL   RDW 15.1  11.5 - 15.5 %   Platelets 172  150 - 400 K/uL  CREATININE, SERUM     Status: Abnormal   Collection Time    11/11/13  2:40 PM  Result Value Ref Range   Creatinine, Ser 0.85  0.50 - 1.35 mg/dL   GFR calc non Af Amer 87 (*) >90 mL/min   GFR calc Af Amer >90  >90 mL/min   Comment: (NOTE)     The eGFR has been calculated using the CKD EPI equation.     This calculation has not been validated in all clinical situations.     eGFR's persistently <90 mL/min signify possible Chronic Kidney     Disease.  TROPONIN I     Status: None   Collection Time    11/11/13  5:00 PM      Result Value Ref Range    Troponin I <0.30  <0.30 ng/mL   Comment:            Due to the release kinetics of cTnI,     a negative result within the first hours     of the onset of symptoms does not rule out     myocardial infarction with certainty.     If myocardial infarction is still suspected,     repeat the test at appropriate intervals.  TROPONIN I     Status: None   Collection Time    11/11/13 11:19 PM      Result Value Ref Range   Troponin I <0.30  <0.30 ng/mL   Comment:            Due to the release kinetics of cTnI,     a negative result within the first hours     of the onset of symptoms does not rule out     myocardial infarction with certainty.     If myocardial infarction is still suspected,     repeat the test at appropriate intervals.  PRO B NATRIURETIC PEPTIDE     Status: Abnormal   Collection Time    11/11/13 11:19 PM      Result Value Ref Range   Pro B Natriuretic peptide (BNP) 2217.0 (*) 0 - 125 pg/mL  GLUCOSE, CAPILLARY     Status: Abnormal   Collection Time    11/12/13 12:30 AM      Result Value Ref Range   Glucose-Capillary 107 (*) 70 - 99 mg/dL  BASIC METABOLIC PANEL     Status: Abnormal   Collection Time    11/12/13  5:02 AM      Result Value Ref Range   Sodium 140  137 - 147 mEq/L   Potassium 4.5  3.7 - 5.3 mEq/L   Chloride 100  96 - 112 mEq/L   CO2 27  19 - 32 mEq/L   Glucose, Bld 89  70 - 99 mg/dL   BUN 13  6 - 23 mg/dL   Creatinine, Ser 0.86  0.50 - 1.35 mg/dL   Calcium 8.4  8.4 - 10.5 mg/dL   GFR calc non Af Amer 86 (*) >90 mL/min   GFR calc Af Amer >90  >90 mL/min   Comment: (NOTE)     The eGFR has been calculated using the CKD EPI equation.     This calculation has not been validated in all clinical situations.     eGFR's persistently <90 mL/min signify possible Chronic Kidney     Disease.  MAGNESIUM     Status: None   Collection Time    11/12/13  5:02 AM      Result Value Ref Range   Magnesium 1.8  1.5 - 2.5 mg/dL  PHENYTOIN LEVEL, TOTAL  Status: None    Collection Time    11/12/13  5:02 AM      Result Value Ref Range   Phenytoin Lvl 18.2  10.0 - 20.0 ug/mL    Dg Chest Port 1 View  11/11/2013   CLINICAL DATA:  Shortness of breath, cough and congestion.  EXAM: PORTABLE CHEST - 1 VIEW  COMPARISON:  10/30/2011  FINDINGS: Postoperative changes in the mediastinum. Borderline heart size with increased pulmonary vascularity. Interstitial changes in the lungs probably represent edema. Small bilateral pleural effusions. Focal infiltration or atelectasis in the left lung base behind the heart. No pneumothorax. Metallic foreign body projected over in the right axillary region.  IMPRESSION: Probable pulmonary vascular congestion with interstitial edema or infiltration. Small bilateral pleural effusions. Infiltration or atelectasis in the left lung base.   Electronically Signed   By: Lucienne Capers M.D.   On: 11/11/2013 06:44    Assessment/Plan: Likely recurrent seizures etiology for patient's mental status change as well as urinary incontinence. Dilantin level was well within normal therapeutic range. Neurological examination was unremarkable.  Recommendations: 1. Continue Dilantin 100 mg twice a day. 2. Add Lamictal starting at 25 mg twice a day for 2 weeks then increase to 50 mg twice a day. 3. No neurodiagnostic studies are indicated at this point. 4. Neurology followup after discharge in 2-4 weeks.  C.R. Nicole Kindred, MD [facility] [phone]  11/12/2013, 1:16 PM

## 2013-11-12 NOTE — Progress Notes (Signed)
The patient was found in stumbling and unsteady in the hallway at [facility].  He was unable to follow commands and had difficulty speaking.  He could not answer questions.  He was trembling and was incontinent of urine.  Rapid response was called and Dr. Nadyne Coombes was notified.  The patient's symptoms subsided approximately 20 minutes after finding the patient in the hallway.

## 2013-11-12 NOTE — Plan of Care (Signed)
Problem: Phase I Progression Outcomes Goal: Voiding-avoid urinary catheter unless indicated Outcome: Progressing Output sufficient

## 2013-11-12 NOTE — Plan of Care (Signed)
Problem: Consults Goal: Heart Failure Patient Education (See Patient Education module for education specifics.)  Outcome: Progressing CHF education started

## 2013-11-13 LAB — BASIC METABOLIC PANEL
BUN: 19 mg/dL (ref 6–23)
CHLORIDE: 95 meq/L — AB (ref 96–112)
CO2: 27 mEq/L (ref 19–32)
Calcium: 8.5 mg/dL (ref 8.4–10.5)
Creatinine, Ser: 0.99 mg/dL (ref 0.50–1.35)
GFR calc Af Amer: >90 mL/min (ref >90)
GFR calc non Af Amer: 82 mL/min — ABNORMAL LOW (ref >90)
Glucose, Bld: 87 mg/dL (ref 70–99)
POTASSIUM: 4.2 meq/L (ref 3.7–5.3)
SODIUM: 136 meq/L — AB (ref 137–147)

## 2013-11-13 LAB — PRO B NATRIURETIC PEPTIDE: PRO B NATRI PEPTIDE: 1577 pg/mL — AB (ref 0–125)

## 2013-11-13 MED ORDER — NEBIVOLOL HCL 20 MG PO TABS: 10.0000 mg | ORAL_TABLET | Freq: Every day | ORAL | Status: DC

## 2013-11-13 MED ORDER — LAMOTRIGINE 100 MG PO TABS: 50.0000 mg | ORAL_TABLET | Freq: Two times a day (BID) | ORAL | Status: DC

## 2013-11-13 MED ORDER — LAMOTRIGINE 25 MG PO TABS: 25.0000 mg | ORAL_TABLET | Freq: Two times a day (BID) | ORAL | Status: DC

## 2013-11-13 MED ORDER — ISOSORBIDE MONONITRATE ER 120 MG PO TB24: 120.0000 mg | ORAL_TABLET | Freq: Every day | ORAL | Status: DC

## 2013-11-13 MED ORDER — TRIAMTERENE-HCTZ 37.5-25 MG PO TABS
1.0000 | ORAL_TABLET | Freq: Every day | ORAL | Status: DC
Start: 2013-11-13 — End: 2015-04-05

## 2013-11-13 NOTE — Progress Notes (Signed)
Congestive Heart Failure education completed and reinforced.  Salt restrictive diet reviewed with spouse and daughter.  Verbalized understanding of discharge instructions.

## 2013-11-13 NOTE — Discharge Summary (Signed)
Physician Discharge Summary  Patient ID: Andre Guzman MRN: [id number] DOB/AGE: [birth year] 68 y.o.  Admit date: 11/11/2013 Discharge date: 11/13/2013  Primary Discharge Diagnosis 1. Hypertensive urgency 2. Acute on chroni diastolic heart failure Secondary Discharge Diagnosis 3. H/O seizure disorder. 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 Lamictal added to Phenytoin (level was therapeutic)  Significant Diagnostic Studies: None  Consults: C.R. Nicole Kindred, MD  [facility]: Likely recurrent seizures etiology for patient's mental status change as well as urinary incontinence. Dilantin level was well within normal therapeutic range. Neurological examination was unremarkable.  Recommendations:  1. Continue Dilantin 100 mg twice a day.  2. Add Lamictal starting at 25 mg twice a day for 2 weeks then increase to 50 mg twice a day.  3. No neurodiagnostic studies are indicated at this point.  4. Neurology followup after discharge in 2-4 weeks.   Hospital Course: Patient was admitted via emergency room and he presented with recent onset of leg edema, shortness of breath and was found to be hypertensive.  Patient responded adequately with diuresis and addition of isosorbide mononitrate and also Maxide for diuresis and antihypertension effect.  The night patient was admitted to the hospital, he had incontinence of urine, generalized involuntary movements followed by confusional state suggestive of recurrence of seizure disorder.  Neurology was consulted, did not feel any new investigation was necessary but added laminin 25 mg by mouth twice a day for 2 weeks followed by application to 50 mg by mouth twice a day and outpatient neurology follow-up in 3-4 weeks.  On the day of discharge patient was essentially asymptomatic, in his leg edema has completely resolved and he was stable for discharge.   Recommendations on discharge: patient will be set up for neurology outpatient evaluation,  outpatient echocardiogram will be performed and will consider doing a stress test.  Suspect hypertension to be the etiology for his presentation. New medications include increasing the dose of Bystolic from 10 mg to 20 mg by mouth daily, isosorbide mononitrate 120 mg by mouth daily, Maxzide 37.5/25 one by mouth every morning.  Lamictal 25 mg by mouth twice a day for 2 weeks followed by increasing it to 50 mg by mouth twice a day as recommended by neurology.  Discharge Exam: Blood pressure 111/61, pulse 59, temperature 98.3 F (36.8 C), temperature source Oral, resp. rate 20, height 5\' 6"  (1.676 m), weight 68.72 kg (151 lb 8 oz), SpO2 100.00%.   Constitutional: He appears well-developed and well-nourished. No distress.  HENT: Head: Atraumatic. Prior right craniotomy scar present Eyes: Pupils are equal, round, and reactive to light.  Neck: Normal range of motion. Neck supple.  Cardiovascular: Normal rate, regular rhythm and normal heart sounds. No murmur appreciated Respiratory: Effort normal. He has faint expiratory wheezes bilateral.  Midline scar on chest  GI: Soft. He exhibits no distension.  Musculoskeletal: He exhibits no further edema He exhibits no tenderness.  Neurological: He is alert. O x 3 without focal neuro deficits Skin: Skin is warm and dry.   Labs:   Lab Results  Component Value Date   WBC 5.0 11/11/2013   HGB 11.9* 11/11/2013   HCT 34.8* 11/11/2013   MCV 88.1 11/11/2013   PLT 172 11/11/2013    Recent Labs Lab 11/13/13 0030  NA 136*  K 4.2  CL 95*  CO2 27  BUN 19  CREATININE 0.99  CALCIUM 8.5  GLUCOSE 87   Lab Results  Component Value Date   CKTOTAL 200 10/30/2011  CKMB 4.0 10/30/2011   TROPONINI <0.30 11/11/2013    Lipid Panel     Component Value Date/Time   CHOL 150 10/30/2011 0711   TRIG 53 10/30/2011 0711   HDL 64 10/30/2011 0711   CHOLHDL 2.3 10/30/2011 0711   VLDL 11 10/30/2011 0711   LDLCALC 75 10/30/2011 0711    BNP (last 3 results)  Recent Labs   11/11/13 0609 11/11/13 2319 11/13/13 0030  PROBNP 1855.0* 2217.0* 1577.0*     EKG: 11/12/2013: Normal sinus rhythm at a rate of 60 beats per minute, normal axis, left atrial enlargement, left ventricular hypertrophy with repolarization abnormality. No significant change from prior EKG.   Radiology: Dg Chest Port 1 View  11/11/2013   CLINICAL DATA:  Shortness of breath, cough and congestion.  EXAM: PORTABLE CHEST - 1 VIEW  COMPARISON:  10/30/2011  FINDINGS: Postoperative changes in the mediastinum. Borderline heart size with increased pulmonary vascularity. Interstitial changes in the lungs probably represent edema. Small bilateral pleural effusions. Focal infiltration or atelectasis in the left lung base behind the heart. No pneumothorax. Metallic foreign body projected over in the right axillary region.  IMPRESSION: Probable pulmonary vascular congestion with interstitial edema or infiltration. Small bilateral pleural effusions. Infiltration or atelectasis in the left lung base.   Electronically Signed   By: Lucienne Capers M.D.   On: 11/11/2013 06:44      FOLLOW UP PLANS AND APPOINTMENTS    Medication List         albuterol 108 (90 BASE) MCG/ACT inhaler  Commonly known as:  PROVENTIL HFA;VENTOLIN HFA  Inhale 1-2 puffs into the lungs every 6 (six) hours as needed for wheezing or shortness of breath.     aspirin EC 81 MG tablet  Take 81 mg by mouth daily.     AZOR 10-40 MG per tablet  Generic drug:  amLODipine-olmesartan  Take 1 tablet by mouth daily.     isosorbide mononitrate 120 MG 24 hr tablet  Commonly known as:  IMDUR  Take 1 tablet (120 mg total) by mouth daily.     lamoTRIgine 25 MG tablet  Commonly known as:  LAMICTAL  Take 1 tablet (25 mg total) by mouth 2 (two) times daily.     lamoTRIgine 100 MG tablet  Commonly known as:  LAMICTAL  Take 0.5 tablets (50 mg total) by mouth 2 (two) times daily.  Start taking on:  11/27/2013     Nebivolol HCl 20 MG Tabs  Take  0.5 tablets (10 mg total) by mouth daily.     phenytoin 100 MG ER capsule  Commonly known as:  DILANTIN  Take 100 mg by mouth 2 (two) times daily.     pravastatin 20 MG tablet  Commonly known as:  PRAVACHOL  Take 20 mg by mouth daily.     REFRESH OP  Place 1 drop into both eyes daily as needed (dry eyes).     terazosin 5 MG capsule  Commonly known as:  HYTRIN  Take 5 mg by mouth at bedtime.     triamterene-hydrochlorothiazide 37.5-25 MG per tablet  Commonly known as:  MAXZIDE-25  Take 1 tablet by mouth daily.          Laverda Page, MD 11/13/2013, 3:56 PM  Pager: [phone] Office: [phone] If no answer: [phone]

## 2013-11-13 NOTE — Progress Notes (Signed)
Subjective: No recurrence of seizure activity and altered mental status reported.  Objective: Current vital signs: BP 156/93  Pulse 66  Temp(Src) 98.6 F (37 C) (Oral)  Resp 18  Ht 5\' 6"  (1.676 m)  Wt 68.72 kg (151 lb 8 oz)  BMI 24.46 kg/m2  SpO2 98%  Neurologic Exam: Alert and in no acute distress. Patient is well-oriented to time as well as place. Speech was normal. Patient moved extremities equally with normal strength and coordination.  Medications: I have reviewed the patient's current medications.  Assessment/Plan: Seizure disorder with recurrent seizure 2 nights ago with slightly supratherapeutic Dilantin level. Dilantin is being held in repeat level was is due to be drawn this evening. He was started on Lamictal 25 mg twice a day and appears to be tolerating this medicine well.  No changes in current management anticipated.  C.R. Nicole Kindred, MD [facility] [phone]  11/13/2013  12:18 PM

## 2014-02-08 ENCOUNTER — Emergency Department (HOSPITAL_COMMUNITY)
Admission: EM | Admit: 2014-02-08 | Discharge: 2014-02-08 | Disposition: A | Discharge status: Home / Self Care | Payer: Medicare Other | Source: Home / Self Care | Attending: Family Medicine | Admitting: Family Medicine

## 2014-02-08 ENCOUNTER — Encounter (HOSPITAL_COMMUNITY): Payer: Self-pay | Admitting: Family Medicine

## 2014-02-08 DIAGNOSIS — I959 Hypotension, unspecified: Secondary | ICD-10-CM

## 2014-02-08 DIAGNOSIS — I951 Orthostatic hypotension: Secondary | ICD-10-CM

## 2014-02-08 NOTE — ED Provider Notes (Addendum)
CSN: [id number]     Arrival date & time 02/08/14  1837 History   First MD Initiated Contact with Patient 02/08/14 1912     Chief Complaint  Patient presents with  . Dizziness   (Consider location/radiation/quality/duration/timing/severity/associated sxs/prior Treatment) HPI  Dizzy spells. Started today BP typically in the 135/65 range. This morning on home cuff it was 90/50. This morning it registered low. Comes and goes. Typically last around 10-15 min. Typically come on when going from inactivity to standing or walking. Denies any palpitatinos, syncope, CP, SOB. No recent illnesses. Eating regular diet. Took all bp meds as prescribed. Saw Dr. Einar Gip (cardiology) last week.   Past Medical History  Diagnosis Date  . Coronary artery disease   . Shortness of breath   . Seizures   . Hypertension   . Pneumonia    Past Surgical History  Procedure Laterality Date  . Cardiac catheterization    . Coronary angioplasty with stent placement  07/22/2011  . Cardiac valve surgery    . Coronary artery bypass graft     No family history on file. History  Substance Use Topics  . Smoking status: Former Smoker -- 0.50 packs/day for 18 years    Types: Cigarettes    Quit date: 06/24/2011  . Smokeless tobacco: Never Used  . Alcohol Use: Yes     Comment: occasional beer, 1-2 per week    Review of Systems Per HPI with all other pertinent systems negative.   Allergies  Review of patient's allergies indicates no known allergies.  Home Medications   Prior to Admission medications   Medication Sig Start Date End Date Taking? Authorizing Provider  aspirin EC 81 MG tablet Take 81 mg by mouth daily.   Yes Historical Provider, MD  phenytoin (DILANTIN) 100 MG ER capsule Take 100 mg by mouth 2 (two) times daily.   Yes Historical Provider, MD  albuterol (PROVENTIL HFA;VENTOLIN HFA) 108 (90 BASE) MCG/ACT inhaler Inhale 1-2 puffs into the lungs every 6 (six) hours as needed for wheezing or shortness of  breath.    Historical Provider, MD  amLODipine-olmesartan (AZOR) 10-40 MG per tablet Take 1 tablet by mouth daily.    Historical Provider, MD  isosorbide mononitrate (IMDUR) 120 MG 24 hr tablet Take 1 tablet (120 mg total) by mouth daily. 11/13/13   Laverda Page, MD  lamoTRIgine (LAMICTAL) 100 MG tablet Take 0.5 tablets (50 mg total) by mouth 2 (two) times daily. 11/27/13   Laverda Page, MD  lamoTRIgine (LAMICTAL) 25 MG tablet Take 1 tablet (25 mg total) by mouth 2 (two) times daily. 11/13/13   Laverda Page, MD  nebivolol 20 MG TABS Take 0.5 tablets (10 mg total) by mouth daily. 11/13/13 11/25/15  Laverda Page, MD  Polyvinyl Alcohol-Povidone (REFRESH OP) Place 1 drop into both eyes daily as needed (dry eyes).    Historical Provider, MD  pravastatin (PRAVACHOL) 20 MG tablet Take 20 mg by mouth daily.    Historical Provider, MD  terazosin (HYTRIN) 5 MG capsule Take 5 mg by mouth at bedtime.    Historical Provider, MD  triamterene-hydrochlorothiazide (MAXZIDE-25) 37.5-25 MG per tablet Take 1 tablet by mouth daily. 11/13/13   Laverda Page, MD   BP 101/54  Pulse 63  Temp(Src) 98.9 F (37.2 C) (Oral)  Resp 18  SpO2 95% Physical Exam  Constitutional: He is oriented to person, place, and time. He appears well-developed and well-nourished. No distress.  HENT:  Head: Normocephalic and atraumatic.  Eyes: EOM are normal. Pupils are equal, round, and reactive to light.  Neck: Normal range of motion.  Cardiovascular: Normal rate, normal heart sounds and intact distal pulses.   No murmur heard. Pulmonary/Chest: Effort normal and breath sounds normal. No respiratory distress. He has no wheezes. He has no rales. He exhibits no tenderness.  Musculoskeletal: Normal range of motion. He exhibits no edema and no tenderness.  Neurological: He is alert and oriented to person, place, and time. No cranial nerve deficit. Coordination normal.  Skin: Skin is warm. He is not diaphoretic.   Psychiatric: He has a normal mood and affect. His behavior is normal. Judgment and thought content normal.   Orthostatic VS for the past 24 hrs:  BP- Lying Pulse- Lying BP- Sitting Pulse- Sitting BP- Standing at 0 minutes Pulse- Standing at 0 minutes  02/08/14 1909 113/59 mmHg 66 101/53 mmHg 64 89/53 mmHg 70        ED Course  Procedures (including critical care time) Labs Review Labs Reviewed - No data to display  Imaging Review No results found.   MDM   1. Orthostasis   2. Hypotension, unspecified    Medication induced hypotension w/ orthostasis EKG nml so arrhythmia unlikely but still possible Pt on Maxzide, Azor, Imdur, nebivolol. Pt to hold maxzide, stay well hydrated.  Pt to call Dr. Einar Gip in the am for f/u in the next 1-2 wks at most Pt to [place] if continues to be hypotensive Pt to add back Maxzide if becomes shypertensive Pt to monitor for signs of fluid overload.   Precautions given and all questions answered   Linna Darner, MD Family Medicine 02/08/2014, 8:26 PM      Waldemar Dickens, MD 02/08/14 2585  Waldemar Dickens, MD 02/08/14 2028

## 2014-02-08 NOTE — ED Notes (Signed)
C/o intermittent dizziness onset 1200 today; last for 10-15 minutes Not feeling dizzy at the moment. Denies cold sx, f/v/n/d Alert, no signs of acute distress.

## 2014-02-08 NOTE — Discharge Instructions (Signed)
Your symptoms are coming from a very low blood pressure.  Your EKG was normal Please stop taking your Maxzide until you get clearance from Dr. Einar Gip Please make an appointment with Dr. Einar Gip within the next 1-2 weeks at most. If your pressure continues to be low please call his office for further recommendations.

## 2014-04-27 ENCOUNTER — Encounter (HOSPITAL_COMMUNITY): Payer: Self-pay | Admitting: Cardiology

## 2014-12-07 ENCOUNTER — Ambulatory Visit
Admission: RE | Admit: 2014-12-07 | Discharge: 2014-12-07 | Disposition: A | Discharge status: Home / Self Care | Payer: Medicare Other | Source: Ambulatory Visit | Attending: Internal Medicine | Admitting: Internal Medicine

## 2014-12-07 ENCOUNTER — Other Ambulatory Visit: Payer: Self-pay | Admitting: Internal Medicine

## 2014-12-07 DIAGNOSIS — R059 Cough, unspecified: Secondary | ICD-10-CM

## 2014-12-07 DIAGNOSIS — R634 Abnormal weight loss: Secondary | ICD-10-CM

## 2014-12-07 DIAGNOSIS — R05 Cough: Secondary | ICD-10-CM

## 2014-12-07 DIAGNOSIS — F17200 Nicotine dependence, unspecified, uncomplicated: Secondary | ICD-10-CM

## 2014-12-12 ENCOUNTER — Other Ambulatory Visit (HOSPITAL_COMMUNITY): Payer: Self-pay | Admitting: Internal Medicine

## 2014-12-12 DIAGNOSIS — R1310 Dysphagia, unspecified: Secondary | ICD-10-CM

## 2014-12-19 ENCOUNTER — Ambulatory Visit (HOSPITAL_COMMUNITY)
Admission: RE | Admit: 2014-12-19 | Discharge: 2014-12-19 | Disposition: A | Discharge status: Home / Self Care | Payer: Medicare Other | Source: Ambulatory Visit | Attending: Internal Medicine | Admitting: Internal Medicine

## 2014-12-19 DIAGNOSIS — K219 Gastro-esophageal reflux disease without esophagitis: Secondary | ICD-10-CM | POA: Diagnosis not present

## 2014-12-19 DIAGNOSIS — R933 Abnormal findings on diagnostic imaging of other parts of digestive tract: Secondary | ICD-10-CM | POA: Insufficient documentation

## 2014-12-19 DIAGNOSIS — R1310 Dysphagia, unspecified: Secondary | ICD-10-CM | POA: Insufficient documentation

## 2014-12-19 DIAGNOSIS — K224 Dyskinesia of esophagus: Secondary | ICD-10-CM | POA: Diagnosis not present

## 2015-01-02 ENCOUNTER — Other Ambulatory Visit: Payer: Self-pay | Admitting: Gastroenterology

## 2015-01-02 ENCOUNTER — Ambulatory Visit
Admission: RE | Admit: 2015-01-02 | Discharge: 2015-01-02 | Disposition: A | Discharge status: Home / Self Care | Payer: Medicare Other | Source: Ambulatory Visit | Attending: Gastroenterology | Admitting: Gastroenterology

## 2015-01-02 DIAGNOSIS — R1310 Dysphagia, unspecified: Secondary | ICD-10-CM

## 2015-01-02 DIAGNOSIS — C159 Malignant neoplasm of esophagus, unspecified: Secondary | ICD-10-CM

## 2015-01-02 HISTORY — PX: ESOPHAGOGASTRODUODENOSCOPY: SHX1529

## 2015-01-02 MED ORDER — IOPAMIDOL (ISOVUE-300) INJECTION 61%
100.0000 mL | Freq: Once | INTRAVENOUS | Status: AC | PRN
  Administered 2015-01-02: 100 mL via INTRAVENOUS

## 2015-01-09 ENCOUNTER — Other Ambulatory Visit: Payer: Self-pay | Admitting: *Deleted

## 2015-01-09 DIAGNOSIS — C159 Malignant neoplasm of esophagus, unspecified: Secondary | ICD-10-CM

## 2015-01-09 NOTE — Progress Notes (Signed)
[facility]  Telephone:([phone]) [phone] Fax:([phone]) [place] Note   Patient Care Team: Glendale Chard, MD as PCP - General (Internal Medicine) Adrian Prows, MD as Consulting Physician (Cardiology) Jovita Gamma, MD as Consulting Physician (Neurosurgery) Lowella Bandy, MD as Consulting Physician (Urology) 01/11/2015  CHIEF COMPLAINTS/PURPOSE OF CONSULTATION:  Newly diagnosed esophageal squamous cell carcinoma    Squamous cell esophageal cancer   01/02/2015 Initial Diagnosis Squamous cell esophageal cancer   01/02/2015 Procedure EGD by Dr. Benson Norway showed a large, fungating mass with no bleeding and with stigmata met of recent bleeding was found in the third of the esophagus. The mass was completely obstructing and circumferential. The stomach and examined duodenum were normal.   01/02/2015 Imaging CT CAP showed 9 cm segment of irregular his social worker thickening. Single enlarged subcarinal lymph node, bilateral pulmonary nodules (3), 31m right middle lobe next to right atrium, 3 RUL and a 7 mm lingular lung nodules.    01/02/2015 Initial Biopsy Esophageal mass biopsy showed poorly differentiated squamous cell carcinoma.    HISTORY OF PRESENTING ILLNESS:  Andre MANO69y.o. male is here because of recently diagnosed esophageal squamous cell carcinoma.  He has had dysphagia for one month, with solid food mainly, he is able to keep liquid and soft diet down. He lost about 30 lbs in the the past month, but his weight has been stable in the past week since he started taking boost. He had some chest pain when he swallows. He is constipated, on stool softener, no bleeding.  He otherwise feels fine. His energy level is fair, he functions very well, no limitation on his physical activities. No nausea, no other pain, cough or other, he drinks about 2 boost a day, soup,and some soft diet.  He was referred to gastroenterologist Dr. HBenson Norwayby his primary care physician. He  underwent EGD which showed a circumferential, obstructing, fungating mass in the third esophagus, biopsy showed poorly differentiated squamous cell carcinoma. CT scan chest abdomen and pelvis showed 3 lung nodules, with the largest 1.2 cm in the right middle lobe next to the right atrium.  MEDICAL HISTORY:  Past Medical History  Diagnosis Date  . Coronary artery disease   . Shortness of breath   . Seizures   . Hypertension   . Pneumonia   . COPD (chronic obstructive pulmonary disease)   . Stroke   . Esophageal cancer     SURGICAL HISTORY: Past Surgical History  Procedure Laterality Date  . Cardiac catheterization    . Coronary angioplasty with stent placement  07/22/2011  . Cardiac valve surgery    . Coronary artery bypass graft    . Lower extremity angiogram N/A 07/01/2011    Procedure: LOWER EXTREMITY ANGIOGRAM;  Surgeon: JLaverda Page MD;  [facility];  Service: Cardiovascular;  Laterality: N/A;  . Left heart catheterization with coronary angiogram N/A 07/01/2011    Procedure: LEFT HEART CATHETERIZATION WITH CORONARY ANGIOGRAM;  Surgeon: JLaverda Page MD;  [facility];  Service: Cardiovascular;  Laterality: N/A;  . Percutaneous coronary rotoblator intervention (pci-r) N/A 07/22/2011    Procedure: PERCUTANEOUS CORONARY ROTOBLATOR INTERVENTION (PCI-R);  Surgeon: JLaverda Page MD;  [facility];  Service: Cardiovascular;  Laterality: N/A;  . Left heart catheterization with coronary angiogram N/A 10/30/2011    Procedure: LEFT HEART CATHETERIZATION WITH CORONARY ANGIOGRAM;  Surgeon: JLaverda Page MD;  [facility];  Service: Cardiovascular;  Laterality: N/A;  .  Esophagogastroduodenoscopy  01/02/15    SOCIAL HISTORY: Social History   Social History  . Marital Status: Married, lives with his wife     Spouse Name: N/A  . Number of Children: 21, 1 daughter and 3 sons, all lives in [place]   . Years of Education: N/A    Occupational History  . Retired Nature conservation officer    Social History Main Topics  . Smoking status: Current Every Day Smoker -- 0.50 packs/day for 50 years    Types: Cigarettes  . Smokeless tobacco: Never Used  . 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 Drug Use: No  . Sexual Activity: Not on file     Comment: did not ask   Other Topics Concern  . Not on file   Social History Narrative    FAMILY HISTORY: Family History  Problem Relation Age of Onset  . Heart disease Mother   . Hypertension Mother   . Heart disease Sister   . Hypertension Sister   . Heart disease Brother   . Hypertension Daughter     ALLERGIES:  has No Known Allergies.  MEDICATIONS:  Current Outpatient Prescriptions  Medication Sig Dispense Refill  . albuterol (PROVENTIL HFA;VENTOLIN HFA) 108 (90 BASE) MCG/ACT inhaler Inhale 1-2 puffs into the lungs every 6 (six) hours as needed for wheezing or shortness of breath.    Marland Kitchen aspirin EC 81 MG tablet Take 81 mg by mouth daily.    . isosorbide mononitrate (IMDUR) 120 MG 24 hr tablet Take 1 tablet (120 mg total) by mouth daily. 30 tablet 2  . phenytoin (DILANTIN) 100 MG ER capsule Take 100 mg by mouth 2 (two) times daily.    . Polyvinyl Alcohol-Povidone (REFRESH OP) Place 1 drop into both eyes daily as needed (dry eyes).    . pravastatin (PRAVACHOL) 40 MG tablet     . terazosin (HYTRIN) 5 MG capsule Take 5 mg by mouth at bedtime.    . triamterene-hydrochlorothiazide (MAXZIDE-25) 37.5-25 MG per tablet Take 1 tablet by mouth daily. 30 tablet 2   No current facility-administered medications for this visit.    REVIEW OF SYSTEMS:   Constitutional: Denies fevers, chills or abnormal night sweats Eyes: Denies blurriness of vision, double vision or watery eyes Ears, nose, mouth, throat, and face: Denies mucositis or sore throat Respiratory: Denies cough, dyspnea or wheezes Cardiovascular: Denies palpitation,  chest discomfort or lower extremity swelling Gastrointestinal:  Denies nausea, heartburn or change in bowel habits Skin: Denies abnormal skin rashes Lymphatics: Denies new lymphadenopathy or easy bruising Neurological:Denies numbness, tingling or new weaknesses Behavioral/Psych: Mood is stable, no new changes  All other systems were reviewed with the patient and are negative.  PHYSICAL EXAMINATION: ECOG PERFORMANCE STATUS: 1 - Symptomatic but completely ambulatory  Filed Vitals:   01/10/15 1125  BP: 119/74  Pulse: 94  Temp: 98.2 F (36.8 C)  Resp: 18   Filed Weights   01/10/15 1125  Weight: 130 lb 12.8 oz (59.33 kg)    GENERAL:alert, no distress and comfortable SKIN: skin color, texture, turgor are normal, no rashes or significant lesions EYES: normal, conjunctiva are pink and non-injected, sclera clear OROPHARYNX:no exudate, no erythema and lips, buccal mucosa, and tongue normal  NECK: supple, thyroid normal size, non-tender, without nodularity LYMPH:  no palpable lymphadenopathy in the cervical, axillary or inguinal LUNGS: clear to auscultation and percussion with normal breathing effort HEART: regular rate & rhythm and  no murmurs and no lower extremity edema ABDOMEN:abdomen soft, non-tender and normal bowel sounds Musculoskeletal:no cyanosis of digits and no clubbing  PSYCH: alert & oriented x 3 with fluent speech NEURO: no focal motor/sensory deficits  LABORATORY DATA:  I have reviewed the data as listed Lab Results  Component Value Date   WBC 9.0 01/10/2015   HGB 11.6* 01/10/2015   HCT 33.6* 01/10/2015   MCV 86.8 01/10/2015   PLT 291 01/10/2015    Recent Labs  01/10/15 1431  NA 139  K 4.5  CO2 28  GLUCOSE 123  BUN 13.3  CREATININE 0.9  CALCIUM 9.5  PROT 8.2  ALBUMIN 3.4*  AST 22  ALT 18  ALKPHOS 138  BILITOT 0.29    RADIOGRAPHIC STUDIES: I have personally reviewed the radiological images as listed and agreed with the findings in the  report.  Ct Chest, abdomen and pelvis W Contrast 01/02/2015    IMPRESSION: Chest Impression:  1. Long 9 cm segment of irregular esophageal wall thickening beginning above the carina consists with esophageal carcinoma. 2. Eccentric gas-filled ulceration along the RIGHT wall of the esophagus. 3. Ill-defined fat planes surrounding the thickened esophagus suggests infiltration into the mediastinum. 4. Single enlarged subcarinal lymph node. 5. Bilateral pulmonary nodules (3) concerning for pulmonary metastasis.  Abdomen / Pelvis Impression:  1. Small gastrohepatic ligament lymph nodes. No clear evidence of metastatic adenopathy in the upper abdomen. 2. No evidence of liver metastasis 3. Atherosclerotic calcification aorta. 4. Prostate hypertrophy with bladder distension.   Electronically Signed   By: Suzy Bouchard M.D.   On: 01/02/2015 18:50     Dg Esophagus  12/19/2014   CLINICAL DATA:  Patient with pain in the distal esophagus. Patient feels like food is stuck in the esophagus for 1 month.  EXAM: ESOPHOGRAM / BARIUM SWALLOW / BARIUM TABLET STUDY  TECHNIQUE: Combined double contrast and single contrast examination performed using effervescent crystals, thick barium liquid, and thin barium liquid. The patient was observed with fluoroscopy swallowing a 13 mm barium sulphate tablet.  FLUOROSCOPY TIME:  Fluoroscopy Time:  3 minutes, 25 seconds  COMPARISON:  Chest radiograph 12/07/2014  FINDINGS: Examination of the esophagus demonstrates a long segment of esophageal irregularity and narrowing at the level of the midesophagus. This masslike irregularity and narrowing persists on multiple projections. The GE junction is grossly unremarkable. There is spontaneous full column gastroesophageal reflux during the examination. Moderate esophageal dysmotility. The barium tablet did not pass through the level of abnormality within the midesophagus. The stomach is grossly normal in appearance.  IMPRESSION: Long segment  irregularity and masslike thickening of the midesophagus concerning for primary esophageal carcinoma. This needs dedicated evaluation with upper endoscopy.  These results will be called to the ordering clinician or representative by the Radiologist Assistant, and communication documented in the PACS or zVision Dashboard.   Electronically Signed   By: Lovey Newcomer M.D.   On: 12/19/2014 11:21    ASSESSMENT & PLAN: 69 year old African-American male, with past medical history of hypertension, history of stroke, coronary artery disease, who presents with progressive dysphasia.  1. Distal esophagus squamous cell carcinoma, [id number] -I reviewed his EGD, CT scan findings and the biopsy results in great details with patient and his daughter. -He has a 3 pulmonary nodules, the largest one in the right middle lobe is concerning for metastatic disease. However due to the location (very close to the right atrium), it'll be very difficult for needle biopsy. -I recommend to obtain a PET CT scan  for further evaluation, especially for distant metastasis. -He is scheduled to see radiation oncologist doctor Dr. Sondra Come this afternoon.  -I'll refer him to thoracic surgeon Dr. Servando Snare  -We reviewed the history history of esophageal cancer, and a multidisciplinary treatment approach. I discussed the role of chemotherapy, commonly in combination with radiation, to shrink the tumor, and possible surgery after chemoRT.  -I plan to see him after his PET scan, to decides the next course, and finalize the treatment plan especially the chemotherapy.   -We discussed the role of feeding tube. If he has worsening dysphasia, he will need nasogastric feeding tube or J-tube.  2. HTN, CAD, history of seizure and CVA -Follow up with primary care physician  Plan -PET/CT -Refer to Dr. Servando Snare -I'll see him back in 2 weeks to finalize his treatment plan  Orders Placed This Encounter  Procedures  . NM PET Image Initial (PI) Whole  Body    Standing Status: Future     Number of Occurrences:      Standing Expiration Date: 01/10/2016    Order Specific Question:  Reason for Exam (SYMPTOM  OR DIAGNOSIS REQUIRED)    Answer:  staging, rule out mets    Order Specific Question:  Preferred imaging location?    Answer:  [facility]  . CBC & Diff and Retic    Standing Status: Standing     Number of Occurrences: 30     Standing Expiration Date: 01/09/2018  . Comprehensive metabolic panel (Cmet) - [facility]    Standing Status: Standing     Number of Occurrences: 30     Standing Expiration Date: 01/09/2018  . Ambulatory referral to Cardiothoracic Surgery    Referral Priority:  Routine    Referral Type:  Surgical    Referral Reason:  Specialty Services Required    Requested Specialty:  Cardiothoracic Surgery    Number of Visits Requested:  1    All questions were answered. The patient knows to call the clinic with any problems, questions or concerns. I spent 60 minutes counseling the patient face to face. The total time spent in the appointment was 65 minutes and more than 50% was on counseling.     Truitt Merle, MD 01/11/2015 3:48 PM

## 2015-01-10 ENCOUNTER — Encounter: Payer: Self-pay | Admitting: Radiation Oncology

## 2015-01-10 ENCOUNTER — Ambulatory Visit: Payer: Medicare Other

## 2015-01-10 ENCOUNTER — Encounter: Payer: Self-pay | Admitting: Hematology

## 2015-01-10 ENCOUNTER — Ambulatory Visit
Admission: RE | Admit: 2015-01-10 | Discharge: 2015-01-10 | Disposition: A | Discharge status: Home / Self Care | Payer: Medicare Other | Source: Ambulatory Visit | Attending: Radiation Oncology | Admitting: Radiation Oncology

## 2015-01-10 ENCOUNTER — Ambulatory Visit (HOSPITAL_BASED_OUTPATIENT_CLINIC_OR_DEPARTMENT_OTHER): Payer: Medicare Other

## 2015-01-10 ENCOUNTER — Telehealth: Payer: Self-pay | Admitting: Hematology

## 2015-01-10 ENCOUNTER — Ambulatory Visit (HOSPITAL_BASED_OUTPATIENT_CLINIC_OR_DEPARTMENT_OTHER): Payer: Medicare Other | Admitting: Hematology

## 2015-01-10 ENCOUNTER — Encounter: Payer: Self-pay | Admitting: *Deleted

## 2015-01-10 ENCOUNTER — Other Ambulatory Visit: Payer: Self-pay | Admitting: Hematology

## 2015-01-10 VITALS — BP 119/74 | HR 94 | Temp 98.2°F | Resp 18 | Ht 66.0 in | Wt 130.8 lb

## 2015-01-10 VITALS — BP 129/77 | HR 88 | Temp 98.1°F | Resp 16 | Ht 68.0 in | Wt 131.2 lb

## 2015-01-10 DIAGNOSIS — R918 Other nonspecific abnormal finding of lung field: Secondary | ICD-10-CM | POA: Diagnosis not present

## 2015-01-10 DIAGNOSIS — J449 Chronic obstructive pulmonary disease, unspecified: Secondary | ICD-10-CM | POA: Insufficient documentation

## 2015-01-10 DIAGNOSIS — I10 Essential (primary) hypertension: Secondary | ICD-10-CM | POA: Insufficient documentation

## 2015-01-10 DIAGNOSIS — C154 Malignant neoplasm of middle third of esophagus: Secondary | ICD-10-CM | POA: Insufficient documentation

## 2015-01-10 DIAGNOSIS — F1020 Alcohol dependence, uncomplicated: Secondary | ICD-10-CM | POA: Diagnosis not present

## 2015-01-10 DIAGNOSIS — I2510 Atherosclerotic heart disease of native coronary artery without angina pectoris: Secondary | ICD-10-CM | POA: Diagnosis not present

## 2015-01-10 DIAGNOSIS — Z510 Encounter for antineoplastic radiation therapy: Secondary | ICD-10-CM | POA: Diagnosis not present

## 2015-01-10 DIAGNOSIS — C155 Malignant neoplasm of lower third of esophagus: Secondary | ICD-10-CM

## 2015-01-10 DIAGNOSIS — R569 Unspecified convulsions: Secondary | ICD-10-CM | POA: Insufficient documentation

## 2015-01-10 DIAGNOSIS — C159 Malignant neoplasm of esophagus, unspecified: Secondary | ICD-10-CM

## 2015-01-10 DIAGNOSIS — Z87891 Personal history of nicotine dependence: Secondary | ICD-10-CM | POA: Diagnosis not present

## 2015-01-10 LAB — COMPREHENSIVE METABOLIC PANEL (CC13)
ALT: 18 U/L (ref 0–55)
ANION GAP: 11 meq/L (ref 3–11)
AST: 22 U/L (ref 5–34)
Albumin: 3.4 g/dL — ABNORMAL LOW (ref 3.5–5.0)
Alkaline Phosphatase: 138 U/L (ref 40–150)
BUN: 13.3 mg/dL (ref 7.0–26.0)
CHLORIDE: 100 meq/L (ref 98–109)
CO2: 28 meq/L (ref 22–29)
CREATININE: 0.9 mg/dL (ref 0.7–1.3)
Calcium: 9.5 mg/dL (ref 8.4–10.4)
EGFR: >90 mL/min/{1.73_m2} (ref >90)
GLUCOSE: 123 mg/dL (ref 70–140)
Potassium: 4.5 mEq/L (ref 3.5–5.1)
SODIUM: 139 meq/L (ref 136–145)
Total Bilirubin: 0.29 mg/dL (ref 0.20–1.20)
Total Protein: 8.2 g/dL (ref 6.4–8.3)

## 2015-01-10 LAB — CBC & DIFF AND RETIC
BASO%: 0.2 % (ref 0.0–2.0)
Basophils Absolute: 0 10*3/uL (ref 0.0–0.1)
EOS%: 0.8 % (ref 0.0–7.0)
Eosinophils Absolute: 0.1 10*3/uL (ref 0.0–0.5)
HCT: 33.6 % — ABNORMAL LOW (ref 38.4–49.9)
HGB: 11.6 g/dL — ABNORMAL LOW (ref 13.0–17.1)
IMMATURE RETIC FRACT: 3.6 % (ref 3.00–10.60)
LYMPH#: 1.9 10*3/uL (ref 0.9–3.3)
LYMPH%: 21.5 % (ref 14.0–49.0)
MCH: 30 pg (ref 27.2–33.4)
MCHC: 34.5 g/dL (ref 32.0–36.0)
MCV: 86.8 fL (ref 79.3–98.0)
MONO#: 0.8 10*3/uL (ref 0.1–0.9)
MONO%: 8.6 % (ref 0.0–14.0)
NEUT%: 68.9 % (ref 39.0–75.0)
NEUTROS ABS: 6.2 10*3/uL (ref 1.5–6.5)
PLATELETS: 291 10*3/uL (ref 140–400)
RBC: 3.87 10*6/uL — ABNORMAL LOW (ref 4.20–5.82)
RDW: 14.2 % (ref 11.0–14.6)
RETIC %: 1.08 % (ref 0.80–1.80)
RETIC CT ABS: 41.8 10*3/uL (ref 34.80–93.90)
WBC: 9 10*3/uL (ref 4.0–10.3)

## 2015-01-10 NOTE — Telephone Encounter (Signed)
per pof to sch pt appt-gave pt copy of sch-sent back to lab @ 3:00 pt had radonc appt sch

## 2015-01-10 NOTE — Progress Notes (Signed)
Checked in new pt with no financial concerns prior to seeing the dr.  Abbott Pao has Shauna's card for any billing questions, concerns or if financial assistance is needed.

## 2015-01-10 NOTE — Progress Notes (Signed)
Thoracic Location of Tumor / Histology: Esophageal cancer  Patient presented:    Biopsies revealed:   01/02/15   Tobacco/Marijuana/Snuff/ETOH use: smoked 1/2 ppd for 50 years.  Said he quit 2 weeks ago.  Used to be a heavy drinker.  Now drinks 1-2 beers per week.  Denies snuff and marijuana use.  Past/Anticipated interventions by gastroenterology, if any: EGD with biopsy on 01/02/15.  Surgery depending on PET scan   Past/Anticipated interventions by medical oncology, if any: Saw Dr. Burr Medico on 01/10/15  Signs/Symptoms  Weight changes, if any: has lost 30 lbs since April.  Eating soft foods like mashed potatos.  He is also drinking 2 boosts per day  SAFETY ISSUES:  Prior radiation? no  Pacemaker/ICD? no   Possible current pregnancy?no  Is the patient on methotrexate? no  Current Complaints / other details:  Patient has pain 10/10 when swallowing.  He says food gets stuck in his throat and he has to drink water to push it down.  He is here with his daughter.  He reports that Dr. Burr Medico has ordered a Pet scan and recommends having chemotherapy and radiation.  BP 129/77 mmHg  Pulse 88  Temp(Src) 98.1 F (36.7 C) (Oral)  Resp 16  Ht 5\' 8"  (1.727 m)  Wt 131 lb 3.2 oz (59.512 kg)  BMI 19.95 kg/m2  SpO2 100%

## 2015-01-10 NOTE — Progress Notes (Signed)
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 male who is seen out of the courtesy of Dr. Burr Medico for an opinion concerning radiation therapy as part of management of patient's recently diagnosed esophageal cancer.  He presented with difficulty swallowing in July and saw his PCP, Dr. Baird Cancer. She ordered a barium swallow study on 12/19/14, showing an irregularity in the mid-esophagus. The pt was then referred to Dr. Benson Norway. The pt then underwent an EGD with biopsy on 01/02/15. This found a completely obstructive, esophageal tumor in the upper third of the esophagus. Pathology determined the tumor to be squamous cell esophageal cancer. He also had CT of the chest, abdomen, and pelvis on 01/02/15. The CT found bilateral pulmonary nodules concerning for pulmonary metastasis, an enlarged subcarinal lymph node, and irregular esophageal wall thickening beginning above the carina consistent with esophageal carcinoma and esophageal ulceration. He was referred to Dr. Burr Medico earlier today who has ordered a PET scan. The patient has been referred to thoracic surgery for input.  PREVIOUS RADIATION THERAPY: No  PAST MEDICAL HISTORY:  has a past medical history of Coronary artery disease; Shortness of breath; Seizures; Hypertension; Pneumonia; COPD (chronic obstructive pulmonary disease); Stroke; and Esophageal cancer.    PAST SURGICAL HISTORY: Past Surgical History  Procedure Laterality Date  . Cardiac catheterization    . Coronary angioplasty with stent placement  07/22/2011  . Cardiac valve surgery    . Coronary artery bypass graft    . Lower  extremity angiogram N/A 07/01/2011    Procedure: LOWER EXTREMITY ANGIOGRAM;  Surgeon: Laverda Page, MD;  Location: [facility] CATH LAB;  Service: Cardiovascular;  Laterality: N/A;  . Left heart catheterization with coronary angiogram N/A 07/01/2011    Procedure: LEFT HEART CATHETERIZATION WITH CORONARY ANGIOGRAM;  Surgeon: Laverda Page, MD;  Location: [facility] CATH LAB;  Service: Cardiovascular;  Laterality: N/A;  . Percutaneous coronary rotoblator intervention (pci-r) N/A 07/22/2011    Procedure: PERCUTANEOUS CORONARY ROTOBLATOR INTERVENTION (PCI-R);  Surgeon: Laverda Page, MD;  Location: [facility] CATH LAB;  Service: Cardiovascular;  Laterality: N/A;  . Left heart catheterization with coronary angiogram N/A 10/30/2011    Procedure: LEFT HEART CATHETERIZATION WITH CORONARY ANGIOGRAM;  Surgeon: Laverda Page, MD;  Location: [facility] CATH LAB;  Service: Cardiovascular;  Laterality: N/A;  . Esophagogastroduodenoscopy  01/02/15    FAMILY HISTORY: family history includes Heart disease in his brother, mother, and sister; Hypertension in his daughter, mother, and sister.  SOCIAL HISTORY:  reports that he quit smoking about 2 weeks ago. His smoking use included Cigarettes. He has a 25 pack-year smoking history. He has never used smokeless tobacco. He reports that he drinks alcohol. He reports that he does not use illicit drugs.  ALLERGIES: Review of patient's allergies indicates no known allergies.  MEDICATIONS:  Current Outpatient Prescriptions  Medication Sig Dispense Refill  . albuterol (PROVENTIL HFA;VENTOLIN HFA) 108 (90 BASE) MCG/ACT inhaler Inhale 1-2 puffs into the lungs every 6 (six) hours as needed for wheezing or shortness of breath.    Marland Kitchen aspirin EC 81 MG tablet Take 81 mg by mouth daily.    . isosorbide mononitrate (IMDUR)  120 MG 24 hr tablet Take 1 tablet (120 mg total) by mouth daily. 30 tablet 2  . phenytoin (DILANTIN) 100 MG ER capsule Take 100 mg by mouth 2 (two) times daily.    . Polyvinyl  Alcohol-Povidone (REFRESH OP) Place 1 drop into both eyes daily as needed (dry eyes).    . pravastatin (PRAVACHOL) 40 MG tablet     . terazosin (HYTRIN) 5 MG capsule Take 5 mg by mouth at bedtime.    . triamterene-hydrochlorothiazide (MAXZIDE-25) 37.5-25 MG per tablet Take 1 tablet by mouth daily. 30 tablet 2   No current facility-administered medications for this encounter.    REVIEW OF SYSTEMS:  A 15 point review of systems is documented in the electronic medical record. This was obtained by the nursing staff. However, I reviewed this with the patient to discuss relevant findings and make appropriate changes.  Pertinent items are noted in HPI. The pt feels like he has a "lump of coal" in his throat. He reports throat pain and he takes tylenol to make pain go away. He has noticed no change in breathing. He has lost 30 lbs of weight unintentionally with the weight being lost since April 2016. He has an appetite, but finds it diffculty to eat due to his swallowing issues. Denies vomiting or hematemesis. He says solid food gets stuck in his throat and he has to drink water in order to push it down. He is able to eat soft foods and drinks 2 Boost a day. He is here with his daughter. He denies any back pain or issues that prevents him from lying down.  PHYSICAL EXAM:  height is 5\' 8"  (1.727 m) and weight is 131 lb 3.2 oz (59.512 kg). His oral temperature is 98.1 F (36.7 C). His blood pressure is 129/77 and his pulse is 88. His respiration is 16 and oxygen saturation is 100%.   General: Alert and oriented, in no acute distress HEENT: Head is normocephalic. Extraocular movements are intact. Oropharynx is clear. The right side of the skull is indented  somewhat (attributed to a surgery for a blood clot).   Neck: Neck is supple, no palpable cervical or supraclavicular lymphadenopathy. Heart: Regular in rate and rhythm with no murmurs, rubs, or gallops. Chest: Clear to auscultation bilaterally, with no  rhonchi, wheezes, or rales. Scars noted from prior CABG Abdomen: Soft, nontender, nondistended, with no rigidity or guarding. Extremities: No cyanosis or edema. Lymphatics: see Neck Exam Skin: No concerning lesions. Musculoskeletal: symmetric strength and muscle tone throughout. Neurologic: Cranial nerves II through XII are grossly intact. No obvious focalities. Speech is fluent. Coordination is intact. Psychiatric: Judgment and insight are intact. Affect is appropriate.  ECOG = 1   LABORATORY DATA:  Lab Results  Component Value Date   WBC 9.0 01/10/2015   HGB 11.6* 01/10/2015   HCT 33.6* 01/10/2015   MCV 86.8 01/10/2015   PLT 291 01/10/2015   NEUTROABS 6.2 01/10/2015   Lab Results  Component Value Date   NA 139 01/10/2015   K 4.5 01/10/2015   CL 95* 11/13/2013   CO2 28 01/10/2015   GLUCOSE 123 01/10/2015   CREATININE 0.9 01/10/2015   CALCIUM 9.5 01/10/2015      RADIOGRAPHY: Ct Chest W Contrast  01/02/2015   CLINICAL DATA:  Patient status post endoscopy 01/02/2015. Difficulty swallowing. New diagnosis of esophageal cancer.  EXAM: CT CHEST, ABDOMEN, AND PELVIS WITH CONTRAST  TECHNIQUE: Multidetector CT imaging of the chest, abdomen and pelvis was performed following  the standard protocol during bolus administration of intravenous contrast.  CONTRAST:  150mL ISOVUE-300 IOPAMIDOL (ISOVUE-300) INJECTION 61%  COMPARISON:  Upper GI 12/19/2014  FINDINGS: CT CHEST FINDINGS  Mediastinum/Nodes:  No axillary or supraclavicular lymphadenopathy.  There is a long 9 cm segment of irregular esophageal wall thickening beginning several cm above the carina. There is an ulceration along the RIGHT wall of the esophagus which is gas-filled (image 27, series 2). The fat planes are ill-defined along this esophageal wall thickening. There is 1 defined lymph node measuring 13 mm adjacent the RIGHT bronchus intermedius on image 32, series 2.  Distal esophagus and GE junction appear normal.  Coronary  calcifications are noted.  No pericardial fluid.  Lungs/Pleura:  3 pulmonary nodules:  3 mm RIGHT upper lobe pulmonary nodule on image 23, series 6  12 mm RIGHT middle lobe pulmonary nodule on image 39  7 mm lingular nodule on image 37  CT ABDOMEN AND PELVIS FINDINGS  Hepatobiliary:  No focal hepatic lesion.  Gallbladder is normal.  Pancreas: Pancreas is normal. No ductal dilatation. No pancreatic inflammation.  Spleen: Normal spleen  Adrenals/urinary tract: Adrenal glands and kidneys are normal. The bladder is distended. The prostate gland is enlarged indents the base the bladder.  Stomach/Bowel: Stomach, duodenum, small bowel colon are unremarkable. Moderate volume stool in the rectum.  Vascular/Lymphatic: Abdominal aorta is calcified. No clear periportal adenopathy. Small gastrohepatic ligament nodes are present measuring up to 8 mm on image 55, series 2. small celiac node measures 6 mm on image 59 series 2. No retroperitoneal adenopathy. No pelvic adenopathy.  Reproductive: Prostate is enlarged indents the base of bladder  Musculoskeletal: Midline sternotomy.  No aggressive osseous lesion.  Other: No free fluid.  IMPRESSION: Chest Impression:  1. Long 9 cm segment of irregular esophageal wall thickening beginning above the carina consists with esophageal carcinoma. 2. Eccentric gas-filled ulceration along the RIGHT wall of the esophagus. 3. Ill-defined fat planes surrounding the thickened esophagus suggests infiltration into the mediastinum. 4. Single enlarged subcarinal lymph node. 5. Bilateral pulmonary nodules (3) concerning for pulmonary metastasis.  Abdomen / Pelvis Impression:  1. Small gastrohepatic ligament lymph nodes. No clear evidence of metastatic adenopathy in the upper abdomen. 2. No evidence of liver metastasis 3. Atherosclerotic calcification aorta. 4. Prostate hypertrophy with bladder distension.   Electronically Signed   By: Suzy Bouchard M.D.   On: 01/02/2015 18:50   Ct Abdomen Pelvis W  Contrast  01/02/2015   CLINICAL DATA:  Patient status post endoscopy 01/02/2015. Difficulty swallowing. New diagnosis of esophageal cancer.  EXAM: CT CHEST, ABDOMEN, AND PELVIS WITH CONTRAST  TECHNIQUE: Multidetector CT imaging of the chest, abdomen and pelvis was performed following the standard protocol during bolus administration of intravenous contrast.  CONTRAST:  132mL ISOVUE-300 IOPAMIDOL (ISOVUE-300) INJECTION 61%  COMPARISON:  Upper GI 12/19/2014  FINDINGS: CT CHEST FINDINGS  Mediastinum/Nodes:  No axillary or supraclavicular lymphadenopathy.  There is a long 9 cm segment of irregular esophageal wall thickening beginning several cm above the carina. There is an ulceration along the RIGHT wall of the esophagus which is gas-filled (image 27, series 2). The fat planes are ill-defined along this esophageal wall thickening. There is 1 defined lymph node measuring 13 mm adjacent the RIGHT bronchus intermedius on image 32, series 2.  Distal esophagus and GE junction appear normal.  Coronary calcifications are noted.  No pericardial fluid.  Lungs/Pleura:  3 pulmonary nodules:  3 mm RIGHT upper lobe pulmonary nodule on image 23, series  6  12 mm RIGHT middle lobe pulmonary nodule on image 39  7 mm lingular nodule on image 37  CT ABDOMEN AND PELVIS FINDINGS  Hepatobiliary:  No focal hepatic lesion.  Gallbladder is normal.  Pancreas: Pancreas is normal. No ductal dilatation. No pancreatic inflammation.  Spleen: Normal spleen  Adrenals/urinary tract: Adrenal glands and kidneys are normal. The bladder is distended. The prostate gland is enlarged indents the base the bladder.  Stomach/Bowel: Stomach, duodenum, small bowel colon are unremarkable. Moderate volume stool in the rectum.  Vascular/Lymphatic: Abdominal aorta is calcified. No clear periportal adenopathy. Small gastrohepatic ligament nodes are present measuring up to 8 mm on image 55, series 2. small celiac node measures 6 mm on image 59 series 2. No  retroperitoneal adenopathy. No pelvic adenopathy.  Reproductive: Prostate is enlarged indents the base of bladder  Musculoskeletal: Midline sternotomy.  No aggressive osseous lesion.  Other: No free fluid.  IMPRESSION: Chest Impression:  1. Long 9 cm segment of irregular esophageal wall thickening beginning above the carina consists with esophageal carcinoma. 2. Eccentric gas-filled ulceration along the RIGHT wall of the esophagus. 3. Ill-defined fat planes surrounding the thickened esophagus suggests infiltration into the mediastinum. 4. Single enlarged subcarinal lymph node. 5. Bilateral pulmonary nodules (3) concerning for pulmonary metastasis.  Abdomen / Pelvis Impression:  1. Small gastrohepatic ligament lymph nodes. No clear evidence of metastatic adenopathy in the upper abdomen. 2. No evidence of liver metastasis 3. Atherosclerotic calcification aorta. 4. Prostate hypertrophy with bladder distension.   Electronically Signed   By: Suzy Bouchard M.D.   On: 01/02/2015 18:50   Dg Esophagus  12/19/2014   CLINICAL DATA:  Patient with pain in the distal esophagus. Patient feels like food is stuck in the esophagus for 1 month.  EXAM: ESOPHOGRAM / BARIUM SWALLOW / BARIUM TABLET STUDY  TECHNIQUE: Combined double contrast and single contrast examination performed using effervescent crystals, thick barium liquid, and thin barium liquid. The patient was observed with fluoroscopy swallowing a 13 mm barium sulphate tablet.  FLUOROSCOPY TIME:  Fluoroscopy Time:  3 minutes, 25 seconds  COMPARISON:  Chest radiograph 12/07/2014  FINDINGS: Examination of the esophagus demonstrates a long segment of esophageal irregularity and narrowing at the level of the midesophagus. This masslike irregularity and narrowing persists on multiple projections. The GE junction is grossly unremarkable. There is spontaneous full column gastroesophageal reflux during the examination. Moderate esophageal dysmotility. The barium tablet did not  pass through the level of abnormality within the midesophagus. The stomach is grossly normal in appearance.  IMPRESSION: Long segment irregularity and masslike thickening of the midesophagus concerning for primary esophageal carcinoma. This needs dedicated evaluation with upper endoscopy.  These results will be called to the ordering clinician or representative by the Radiologist Assistant, and communication documented in the PACS or zVision Dashboard.   Electronically Signed   By: Lovey Newcomer M.D.   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 The pt is a good candidate for radiation along with radiosensitizing chemotherapy. He maybe a possible surgical candidate dependent on the PET scan findings. If patient does have metastatic disease I would still recommend radiation therapy as part of his initial management in light of the extent of involvement and impending complete obstruction.  I explained the findings on the pt's recent CT scans with the patient and his daughter. I also discussed the treatment course and side effects with the patient  and her daughter. He understands this procedure and would like to proceed.   PLAN: The pt is scheduled for a PET scan per Dr. Burr Medico. CT simulation has been scheduled for January 16, 2015. Treatments are to begin the first week of September along with radiosensitizing chemotherapy. A consent form was signed and placed in his medical chart.     This document serves as a record of services personally performed by Gery Pray, MD. It was created on his behalf by Darcus Austin, a trained medicscribe. The creation of this record is based on the scribe's personal observations and the provider's statements to them. This document has been checked and approved by the attending provider.  ------------------------------------------------  Blair Promise, PhD, MD

## 2015-01-10 NOTE — Progress Notes (Signed)
Oncology Nurse Navigator Documentation  Oncology Nurse Navigator Flowsheets 01/10/2015  Referral date to RadOnc/MedOnc 01/09/2015  Navigator Encounter Type Initial MedOnc  Patient Visit Type Medonc  Treatment Phase Treatment planning  Barriers/Navigation Needs No barriers at this time  Interventions Referrals  Referrals Nutrition/dietician  Support Groups/Services GI;ACS  Time Spent with Patient 15  Met with patient and daughter, Lavella Lemons after visits with medical oncology and radiation oncology and made aware of role of nurse navigator. Provided contact information. Will send referral to [facility] for [place] [facility]. Patient denies any current barriers to care-has transportation and is independent. No problems obtaining food or medications as needed. Has healthy wife and #4 children to help. Referral to nutrition.

## 2015-01-11 ENCOUNTER — Encounter: Payer: Self-pay | Admitting: Hematology

## 2015-01-12 ENCOUNTER — Encounter: Payer: Self-pay | Admitting: Cardiothoracic Surgery

## 2015-01-12 ENCOUNTER — Institutional Professional Consult (permissible substitution) (INDEPENDENT_AMBULATORY_CARE_PROVIDER_SITE_OTHER): Payer: Medicare Other | Admitting: Cardiothoracic Surgery

## 2015-01-12 VITALS — BP 113/68 | HR 84 | Resp 20 | Ht 68.0 in | Wt 131.0 lb

## 2015-01-12 DIAGNOSIS — C159 Malignant neoplasm of esophagus, unspecified: Secondary | ICD-10-CM
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
## 2015-01-16 ENCOUNTER — Other Ambulatory Visit: Payer: Self-pay | Admitting: Hematology

## 2015-01-16 ENCOUNTER — Ambulatory Visit
Admission: RE | Admit: 2015-01-16 | Discharge: 2015-01-16 | Disposition: A | Discharge status: Home / Self Care | Payer: Medicare Other | Source: Ambulatory Visit | Attending: Radiation Oncology | Admitting: Radiation Oncology

## 2015-01-16 ENCOUNTER — Other Ambulatory Visit: Payer: Self-pay | Admitting: *Deleted

## 2015-01-16 DIAGNOSIS — Z510 Encounter for antineoplastic radiation therapy: Secondary | ICD-10-CM | POA: Diagnosis not present

## 2015-01-16 DIAGNOSIS — C159 Malignant neoplasm of esophagus, unspecified: Secondary | ICD-10-CM

## 2015-01-17 ENCOUNTER — Other Ambulatory Visit: Payer: Self-pay | Admitting: *Deleted

## 2015-01-17 ENCOUNTER — Telehealth: Payer: Self-pay | Admitting: Hematology

## 2015-01-17 ENCOUNTER — Telehealth: Payer: Self-pay | Admitting: *Deleted

## 2015-01-17 ENCOUNTER — Ambulatory Visit: Payer: Medicare Other | Admitting: Hematology

## 2015-01-17 DIAGNOSIS — C159 Malignant neoplasm of esophagus, unspecified: Secondary | ICD-10-CM

## 2015-01-17 NOTE — Telephone Encounter (Signed)
Dr. Burr Medico needs to move forward sooner on treatment planning. Called wife and informed of appointment to see Dr. Burr Medico tomorrow at 3 pm (arrive at 2:45). She agrees to the appointment. Can review PET scan and talk about specific chemo he needs. Called IR at [facility] 1st available 9/9) and spoke with Anderson Malta: Scheduled PAC on 01/23/15 to arrive at 10:30 at [facility] main entrance. Needs to go to short stay. NPO after midnight night before. OK to take routine meds with sips of water. Hold ASA till after port placed. Scheduled for chemo class on 01/19/15. Will provide Nayshawn all his appointments tomorrow when he is here seeing Dr. Burr Medico.

## 2015-01-17 NOTE — Telephone Encounter (Signed)
per Manuela Schwartz to sch pt w/ Bara Neff-emailed back what was avaiable-will call pt after reply

## 2015-01-17 NOTE — Telephone Encounter (Signed)
Called and left a message with 9/7 appointments

## 2015-01-17 NOTE — Telephone Encounter (Signed)
  Oncology Nurse Navigator Documentation    Navigator Encounter Type: Treatment (01/17/15 0910): MD arranged for PET scan to be done on 01/18/15. Left VM at home # with 01/18/15 PET at [facility]. Arrive at [employer] NPO after midnight as before. Requested return call to confirm.  Wife called back and confirmed the appointment for PET scan 01/18/15 and directions.

## 2015-01-17 NOTE — Telephone Encounter (Signed)
Per staff message and POF I have scheduled appts. Advised scheduler of appts. I also called and spoke to the lead rad tech. They have moved his appt with them to [phone].  JMW

## 2015-01-18 ENCOUNTER — Encounter (HOSPITAL_COMMUNITY)
Admission: RE | Admit: 2015-01-18 | Discharge: 2015-01-18 | Disposition: A | Discharge status: Home / Self Care | Payer: Medicare Other | Source: Ambulatory Visit | Attending: Hematology | Admitting: Hematology

## 2015-01-18 ENCOUNTER — Telehealth: Payer: Self-pay | Admitting: Hematology

## 2015-01-18 ENCOUNTER — Encounter (HOSPITAL_COMMUNITY): Payer: Self-pay

## 2015-01-18 ENCOUNTER — Ambulatory Visit (HOSPITAL_BASED_OUTPATIENT_CLINIC_OR_DEPARTMENT_OTHER): Payer: Medicare Other | Admitting: Hematology

## 2015-01-18 VITALS — BP 128/71 | HR 85 | Temp 98.8°F | Resp 18 | Ht 68.0 in | Wt 128.1 lb

## 2015-01-18 DIAGNOSIS — I10 Essential (primary) hypertension: Secondary | ICD-10-CM | POA: Diagnosis not present

## 2015-01-18 DIAGNOSIS — C155 Malignant neoplasm of lower third of esophagus: Secondary | ICD-10-CM | POA: Diagnosis not present

## 2015-01-18 DIAGNOSIS — C159 Malignant neoplasm of esophagus, unspecified: Secondary | ICD-10-CM

## 2015-01-18 DIAGNOSIS — C7951 Secondary malignant neoplasm of bone: Secondary | ICD-10-CM | POA: Diagnosis not present

## 2015-01-18 LAB — GLUCOSE, CAPILLARY: GLUCOSE-CAPILLARY: 93 mg/dL (ref 65–99)

## 2015-01-18 MED ORDER — FLUDEOXYGLUCOSE F - 18 (FDG) INJECTION
6.4500 | Freq: Once | INTRAVENOUS | Status: DC | PRN
  Administered 2015-01-18: 6.45 via INTRAVENOUS
  Filled 2015-01-18: qty 6.45

## 2015-01-18 NOTE — Telephone Encounter (Signed)
per Janifer Adie to Cx [facility] appt and put Mcclean in @12 :15 for a 12:30 appt for 30 min-sch appt per Myrtle-adv during Dr Lewayne Bunting lunch

## 2015-01-19 ENCOUNTER — Telehealth: Payer: Self-pay | Admitting: *Deleted

## 2015-01-19 ENCOUNTER — Encounter: Payer: Self-pay | Admitting: Hematology

## 2015-01-19 ENCOUNTER — Other Ambulatory Visit: Payer: Self-pay | Admitting: Radiology

## 2015-01-19 ENCOUNTER — Telehealth: Payer: Self-pay | Admitting: Hematology

## 2015-01-19 ENCOUNTER — Other Ambulatory Visit: Payer: Medicare Other

## 2015-01-19 NOTE — Telephone Encounter (Signed)
per Dr Burr Medico to cx lab, inf on 9/7 pump d/c on 9/9 IR CT 1-2 weeks already sch-keep Dr Burr Medico appt on 9/7-gave pt updated avs

## 2015-01-19 NOTE — Telephone Encounter (Signed)
  Oncology Nurse Navigator Documentation    Navigator Encounter Type: Telephone (01/19/15 1623): Spoke with wife and provided her with appointment for 01/23/15 at 1030 to arrive at [facility] main entrance and then to short stay for [facility] placement. He will be NPO after midnight; take morning meds with sip; hold ASA after today; need a driver. Even though chemo is after his palliative RT, MD would like to get this scheduled so we won't have to delay starting chemo waiting for port. She understands and agrees.    Time Spent with Patient: 10(01/19/15 1623)

## 2015-01-19 NOTE — Telephone Encounter (Signed)
"  Andre Guzman, Please call.  I missed your call while out with [name] to a doctor's appointment.  I'm home now."

## 2015-01-19 NOTE — Progress Notes (Signed)
[place]  [phone] [phone]  Clinic Follow up Note   Patient Care Team: Andre Chard, MD as PCP - General (Internal Medicine) Andre Prows, MD as Consulting Physician (Cardiology) Andre Gamma, MD as Consulting Physician (Neurosurgery) Andre Bandy, MD as Consulting Physician (Urology) Andre Isaac, MD as Consulting Physician (Cardiothoracic Surgery) Andre Merle, MD as Consulting Physician (Hematology) Andre Rudd, MD as Consulting Physician (Radiation Oncology) Andre Ade, RN as Registered Nurse Andre Ada, MD as Consulting Physician (Gastroenterology) 01/19/2015  CHIEF COMPLAINTS/PURPOSE OF CONSULTATION:  Follow up esophageal squamous cell carcinoma    Squamous cell esophageal cancer   01/02/2015 Initial Diagnosis Squamous cell esophageal cancer   01/02/2015 Procedure EGD by Dr. Benson Guzman showed a large, fungating mass with no bleeding and with stigmata met of recent bleeding was found in the third of the esophagus. The mass was completely obstructing and circumferential. The stomach and examined duodenum were normal.   01/02/2015 Imaging CT CAP showed 9 cm segment of irregular his social worker thickening. Single enlarged subcarinal lymph node, bilateral pulmonary nodules (3), 65mm right middle lobe next to right atrium, 3 RUL and a 7 mm lingular lung nodules.    01/02/2015 Initial Biopsy Esophageal mass biopsy showed poorly differentiated squamous cell carcinoma.    HISTORY OF PRESENTING ILLNESS:  Andre Guzman 69 y.o. male is here because of recently diagnosed esophageal squamous cell carcinoma.  He has had dysphagia for one month, with solid food mainly, he is able to keep liquid and soft diet down. He lost about 30 lbs in the the past month, but his weight has been stable in the past week since he started taking boost. He had some chest pain when he swallows. He is constipated, on stool softener, no bleeding.  He otherwise feels fine.  His energy level is fair, he functions very well, no limitation on his physical activities. No nausea, no other pain, cough or other, he drinks about 2 boost a day, soup,and some soft diet.  He was referred to gastroenterologist Dr. Benson Guzman by his primary care physician. He underwent EGD which showed a circumferential, obstructing, fungating mass in the third esophagus, biopsy showed poorly differentiated squamous cell carcinoma. CT scan chest abdomen and pelvis showed 3 lung nodules, with the largest 1.2 cm in the right middle lobe next to the right atrium.  CURRENT THERAPY: pending radiation, scheduled to start on 01/23/15   Andre Guzman returns for follow-up, he presents to clinic with his wife and son. He just had a PET scan done this morning. His dysphagia is stable, he still able to tolerate liquids and some soft diet. Mild burning sensation with eating. No other pain, nausea or other new complaints. He lost 3 pounds in the last week.   MEDICAL HISTORY:  Past Medical History  Diagnosis Date  . Coronary artery disease   . Shortness of breath   . Seizures   . Hypertension   . Pneumonia   . COPD (chronic obstructive pulmonary disease)   . Stroke   . Esophageal cancer     SURGICAL HISTORY: Past Surgical History  Procedure Laterality Date  . Cardiac catheterization    . Coronary angioplasty with stent placement  07/22/2011  . Cardiac valve surgery    . Coronary artery bypass graft    . Lower extremity angiogram N/A 07/01/2011    Procedure: LOWER EXTREMITY ANGIOGRAM;  Surgeon: Andre Page, MD;  Location: [facility] C[facility] LAB;  Service: Cardiovascular;  Laterality: N/A;  .  Left heart catheterization with coronary angiogram N/A 07/01/2011    Procedure: LEFT HEART CATHETERIZATION WITH CORONARY ANGIOGRAM;  Surgeon: Andre Page, MD;  Location: [facility] CATH LAB;  Service: Cardiovascular;  Laterality: N/A;  . Percutaneous coronary rotoblator intervention (pci-r) N/A 07/22/2011    Procedure:  PERCUTANEOUS CORONARY ROTOBLATOR INTERVENTION (PCI-R);  Surgeon: Andre Page, MD;  Location: [facility] CATH LAB;  Service: Cardiovascular;  Laterality: N/A;  . Left heart catheterization with coronary angiogram N/A 10/30/2011    Procedure: LEFT HEART CATHETERIZATION WITH CORONARY ANGIOGRAM;  Surgeon: Andre Page, MD;  Location: [facility] CATH LAB;  Service: Cardiovascular;  Laterality: N/A;  . Esophagogastroduodenoscopy  01/02/15    SOCIAL HISTORY: Social History   Social History  . Marital Status: Married, lives with his wife     Spouse Name: N/A  . Number of Children: 2, 1 daughter and 3 sons, all lives in [place]   . Years of Education: N/A   Occupational History  . Retired Nature conservation officer    Social History Main Topics  . Smoking status: Current Every Day Smoker -- 0.50 packs/day for 50 years    Types: Cigarettes  . Smokeless tobacco: Never Used  . 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 Drug Use: No  . Sexual Activity: Not on file     Comment: did not ask   Other Topics Concern  . Not on file   Social History Narrative    FAMILY HISTORY: Family History  Problem Relation Age of Onset  . Heart disease Mother   . Hypertension Mother   . Heart disease Sister   . Hypertension Sister   . Heart disease Brother   . Hypertension Daughter     ALLERGIES:  has No Known Allergies.  MEDICATIONS:  Current Outpatient Prescriptions  Medication Sig Dispense Refill  . albuterol (PROVENTIL HFA;VENTOLIN HFA) 108 (90 BASE) MCG/ACT inhaler Inhale 1-2 puffs into the lungs every 6 (six) hours as needed for wheezing or shortness of breath.    Marland Kitchen aspirin EC 81 MG tablet Take 81 mg by mouth daily.    . isosorbide mononitrate (IMDUR) 120 MG 24 hr tablet Take 1 tablet (120 mg total) by mouth daily. 30 tablet 2  . phenytoin (DILANTIN) 100 MG ER capsule Take 100 mg by mouth 2 (two) times daily.    . Polyvinyl Alcohol-Povidone (REFRESH  OP) Place 1 drop into both eyes daily as needed (dry eyes).    . pravastatin (PRAVACHOL) 40 MG tablet     . terazosin (HYTRIN) 5 MG capsule Take 5 mg by mouth at bedtime.    . triamterene-hydrochlorothiazide (MAXZIDE-25) 37.5-25 MG per tablet Take 1 tablet by mouth daily. 30 tablet 2   No current facility-administered medications for this visit.   Facility-Administered Medications Ordered in Other Visits  Medication Dose Route Frequency Provider Last Rate Last Dose  . fludeoxyglucose F - 18 (FDG) injection 6.45 milli Curie  6.45 milli Curie Intravenous Once PRN Medication Radiologist, MD   6.45 milli Curie at 01/18/15 0845    REVIEW OF SYSTEMS:   Constitutional: Denies fevers, chills or abnormal night sweats, (+) weight loss  Eyes: Denies blurriness of vision, double vision or watery eyes Ears, nose, mouth, throat, and face: Denies mucositis or sore throat Respiratory: Denies cough, dyspnea or wheezes Cardiovascular: Denies palpitation, chest discomfort or lower extremity swelling Gastrointestinal:  (+) Dysphasia, mild heartburn, Denies nausea, vomiting or change in bowel  habits Skin: Denies abnormal skin rashes Lymphatics: Denies new lymphadenopathy or easy bruising Neurological:Denies numbness, tingling or new weaknesses Behavioral/Psych: Mood is stable, no new changes  All other systems were reviewed with the patient and are negative.  PHYSICAL EXAMINATION: ECOG PERFORMANCE STATUS: 1 - Symptomatic but completely ambulatory  Filed Vitals:   01/18/15 1037  BP: 128/71  Pulse: 85  Temp: 98.8 F (37.1 C)  Resp: 18   Filed Weights   01/18/15 1037  Weight: 128 lb 1.6 oz (58.106 kg)    GENERAL:alert, no distress and comfortable SKIN: skin color, texture, turgor are normal, no rashes or significant lesions EYES: normal, conjunctiva are pink and non-injected, sclera clear OROPHARYNX:no exudate, no erythema and lips, buccal mucosa, and tongue normal  NECK: supple, thyroid normal  size, non-tender, without nodularity LYMPH:  no palpable lymphadenopathy in the cervical, axillary or inguinal LUNGS: clear to auscultation and percussion with normal breathing effort HEART: regular rate & rhythm and no murmurs and no lower extremity edema ABDOMEN:abdomen soft, non-tender and normal bowel sounds Musculoskeletal:no cyanosis of digits and no clubbing  PSYCH: alert & oriented x 3 with fluent speech NEURO: no focal motor/sensory deficits  LABORATORY DATA:  I have reviewed the data as listed CBC Latest Ref Rng 01/10/2015 11/11/2013 11/11/2013  WBC 4.0 - 10.3 10e3/uL 9.0 5.0 6.9  Hemoglobin 13.0 - 17.1 g/dL 11.6(L) 11.9(L) 11.6(L)  Hematocrit 38.4 - 49.9 % 33.6(L) 34.8(L) 34.0(L)  Platelets 140 - 400 10e3/uL 291 172 156    CMP Latest Ref Rng 01/10/2015 11/13/2013 11/12/2013  Glucose 70 - 140 mg/dl 123 87 89  BUN 7.0 - 26.0 mg/dL 13.$RemoveBefor'3 19 13  'FQzLzdZofuaD$ Creatinine 0.7 - 1.3 mg/dL 0.9 0.99 0.86  Sodium 136 - 145 mEq/L 139 136(L) 140  Potassium 3.5 - 5.1 mEq/L 4.5 4.2 4.5  Chloride 96 - 112 mEq/L - 95(L) 100  CO2 22 - 29 mEq/L $Remove'28 27 27  'SUqzetA$ Calcium 8.4 - 10.4 mg/dL 9.5 8.5 8.4  Total Protein 6.4 - 8.3 g/dL 8.2 - -  Total Bilirubin 0.20 - 1.20 mg/dL 0.29 - -  Alkaline Phos 40 - 150 U/L 138 - -  AST 5 - 34 U/L 22 - -  ALT 0 - 55 U/L 18 - -      RADIOGRAPHIC STUDIES: I have personally reviewed the radiological images as listed and agreed with the findings in the report.  Ct Chest, abdomen and pelvis W Contrast 01/02/2015    IMPRESSION: Chest Impression:  1. Long 9 cm segment of irregular esophageal wall thickening beginning above the carina consists with esophageal carcinoma. 2. Eccentric gas-filled ulceration along the RIGHT wall of the esophagus. 3. Ill-defined fat planes surrounding the thickened esophagus suggests infiltration into the mediastinum. 4. Single enlarged subcarinal lymph node. 5. Bilateral pulmonary nodules (3) concerning for pulmonary metastasis.  Abdomen / Pelvis Impression:   1. Small gastrohepatic ligament lymph nodes. No clear evidence of metastatic adenopathy in the upper abdomen. 2. No evidence of liver metastasis 3. Atherosclerotic calcification aorta. 4. Prostate hypertrophy with bladder distension.   Electronically Signed   By: Suzy Bouchard M.D.   On: 01/02/2015 18:50     Dg Esophagus  12/19/2014   CLINICAL DATA:  Patient with pain in the distal esophagus. Patient feels like food is stuck in the esophagus for 1 month.  EXAM: ESOPHOGRAM / BARIUM SWALLOW / BARIUM TABLET STUDY  TECHNIQUE: Combined double contrast and single contrast examination performed using effervescent crystals, thick barium liquid, and thin barium liquid. The  patient was observed with fluoroscopy swallowing a 13 mm barium sulphate tablet.  FLUOROSCOPY TIME:  Fluoroscopy Time:  3 minutes, 25 seconds  COMPARISON:  Chest radiograph 12/07/2014  FINDINGS: Examination of the esophagus demonstrates a long segment of esophageal irregularity and narrowing at the level of the midesophagus. This masslike irregularity and narrowing persists on multiple projections. The GE junction is grossly unremarkable. There is spontaneous full column gastroesophageal reflux during the examination. Moderate esophageal dysmotility. The barium tablet did not pass through the level of abnormality within the midesophagus. The stomach is grossly normal in appearance.  IMPRESSION: Long segment irregularity and masslike thickening of the midesophagus concerning for primary esophageal carcinoma. This needs dedicated evaluation with upper endoscopy.  These results will be called to the ordering clinician or representative by the Radiologist Assistant, and communication documented in the PACS or zVision Dashboard.   Electronically Signed   By: Lovey Newcomer M.D.   On: 12/19/2014 11:21   PET 01/18/2015 FINDINGS: NECK  No hypermetabolic lymph nodes in the neck.  CHEST  Again noted is a large long segment mass of the esophagus, which  is diffusely hypermetabolic (SUVmax = 27.0). Subcarinal lymphadenopathy measuring 13 mm in short axis is hypermetabolic (SUVmax = 78.6). There is also some hypermetabolism in the right hilar region without well-defined nodal tissue (SUVmax = 8.0). Multiple pulmonary nodules are noted throughout the lungs bilaterally. Many of these are sub cm in size, and demonstrate no definite hypermetabolism. However, the largest nodules measuring 7 mm in the inferior segment of the lingula (image 46 of series 7), and 13 x 10 mm nodule in the medial segment of the right middle lobe (image 49 of series 7) are both hypermetabolic (SUVmax = 2.5 and 11.9 respectively), compatible with metastatic lesions. Heart size is normal. There is no significant pericardial fluid, thickening or pericardial calcification. There is atherosclerosis of the thoracic aorta, the great vessels of the mediastinum and the coronary arteries, including calcified atherosclerotic plaque in the left main, left anterior descending, left circumflex and right coronary arteries. Status post median sternotomy. High attenuation along the pericardium anterior to the right ventricular outflow tract is favored to be postoperative rather than intrinsic to the pericardium.  ABDOMEN/PELVIS  No abnormal hypermetabolic activity within the liver, pancreas, adrenal glands, or spleen. No hypermetabolic lymph nodes in the abdomen or pelvis. No significant volume of ascites. No pneumoperitoneum. No pathologic distention of small bowel. 2.4 cm diverticulum from the posterior lateral aspect of the right side of the urinary bladder. Atherosclerosis throughout the abdominal and pelvic vasculature.  SKELETON  Subtle area of sclerosis in the superior aspect of the right ilium (image 152 of series 4) demonstrates marked hypermetabolism (SUVmax = 14.4), compatible with a metastasis. No other definite hypermetabolic lesions are noted in the  visualized portions of the skeleton.  IMPRESSION: 1. Previously described esophageal mass is diffusely hypermetabolic. Today's study demonstrates associated hypermetabolic subcarinal and possible right hilar lymphadenopathy, several pulmonary nodules in the lungs bilaterally (largest of which are hypermetabolic), and a hypermetabolic lesion in the superior aspect of the right ilium, compatible with metastatic disease. 2. Atherosclerosis, including left main and 3 vessel coronary artery disease. 3. Additional incidental findings, as above.   ASSESSMENT & PLAN: 68 year old African-American male, with past medical history of hypertension, history of stroke, coronary artery disease, who presents with progressive dysphasia.  1. 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 -I reviewed his PET scan findings with the patient and his family members. Unfortunately, 2 lung nodules are hypermetabolic on PET scan, and also there is a significant sclerotic hypermetabolic right iliac bone lesion, most likely metastatic lesion. -His lung lesions are difficult to biopsy, I'll recommend IR biopsy the right iliac bone lesion to confirm metastasis. -We reviewed the natural course of esophagitis cancer, and the incurable nature of his disease if no metastatic is confirmed. We emphasized the goal of therapy is palliation and prolonged his life. -I have spoken with radiation oncologist Dr. Sondra Come, and he agrees to shorten the duration of radiation, since the goal of therapy is palliative. -I'll plan to start chemotherapy after he completes radiation, with first-line FOLFOX. -He is scheduled to have a port placement next week -We discussed the role of feeding tube. If he has worsening dysphasia, he will need nasogastric feeding tube or J-tube. -Nutrition  consult  2. HTN, CAD, history of seizure and CVA -Follow up with primary care physician  Plan -He is scheduled to start palliative radiation next Tuesday, September 6 -Port placement by our next week -IR CT-guided biopsy of right iliac bone -I'll see him back next week 9/7  -I sent a message to our dietitian Pamala Hurry to see him next week   Orders Placed This Encounter  Procedures  . CT Biopsy    Standing Status: Future     Number of Occurrences:      Standing Expiration Date: 01/19/2016    Scheduling Instructions:     CT guided right ilium bone biopsy (PET positive lesion) to confirm metastasis, please schedule it within 1 week if possible, thanks    Order Specific Question:  Lab orders requested (DO NOT place separate lab orders, these will be automatically ordered during procedure specimen collection):    Answer:  Surgical Pathology    Order Specific Question:  Reason for Exam (SYMPTOM  OR DIAGNOSIS REQUIRED)    Answer:  rule out mets    Order Specific Question:  Preferred imaging location?    Answer:  [facility]    All questions were answered. The patient knows to call the clinic with any problems, questions or concerns. I spent 30 minutes counseling the patient face to face. The total time spent in the appointment was 40 minutes and more than 50% was on counseling.     Andre Merle, MD 01/19/2015 8:29 AM

## 2015-01-22 NOTE — Progress Notes (Signed)
  Radiation Oncology         [phone] ________________________________  Name: Andre Guzman MRN: [id number]  Date: 01/16/2015  DOB: [birth year]  SIMULATION AND TREATMENT PLANNING NOTE    ICD-9-CM ICD-10-CM   1. Squamous cell esophageal cancer 150.9 C15.9     DIAGNOSIS:  Stage IV esophageal cancer  NARRATIVE:  The patient was brought to the [facility].  Identity was confirmed.  All relevant records and images related to the planned course of therapy were reviewed.  The patient freely provided informed written consent to proceed with treatment after reviewing the details related to the planned course of therapy. The consent form was witnessed and verified by the simulation staff.  Then, the patient was set-up in a stable reproducible  supine position for radiation therapy.  CT images were obtained.  Surface markings were placed.  The CT images were loaded into the planning software.  Then the target and avoidance structures were contoured.  Treatment planning then occurred.  The radiation prescription was entered and confirmed.  Then, I designed and supervised the construction of a total of 2 medically necessary complex treatment devices.  I have requested : 3D Simulation  I have requested a DVH of the following structures:PTV, GTV heart lungs, .  I have ordered:Nutrition Consult  PLAN:  The patient will receive 35 Gy in 14 fractions.  ________________________________  -----------------------------------  Blair Promise, PhD, MD

## 2015-01-23 ENCOUNTER — Ambulatory Visit (HOSPITAL_COMMUNITY)
Admission: RE | Admit: 2015-01-23 | Discharge: 2015-01-23 | Disposition: A | Discharge status: Home / Self Care | Payer: Medicare Other | Source: Ambulatory Visit | Attending: Hematology | Admitting: Hematology

## 2015-01-23 ENCOUNTER — Other Ambulatory Visit: Payer: Self-pay | Admitting: Hematology

## 2015-01-23 ENCOUNTER — Encounter (HOSPITAL_COMMUNITY): Payer: Self-pay

## 2015-01-23 DIAGNOSIS — Z951 Presence of aortocoronary bypass graft: Secondary | ICD-10-CM | POA: Insufficient documentation

## 2015-01-23 DIAGNOSIS — R918 Other nonspecific abnormal finding of lung field: Secondary | ICD-10-CM | POA: Diagnosis not present

## 2015-01-23 DIAGNOSIS — Z87891 Personal history of nicotine dependence: Secondary | ICD-10-CM | POA: Diagnosis not present

## 2015-01-23 DIAGNOSIS — N400 Enlarged prostate without lower urinary tract symptoms: Secondary | ICD-10-CM | POA: Insufficient documentation

## 2015-01-23 DIAGNOSIS — C159 Malignant neoplasm of esophagus, unspecified: Secondary | ICD-10-CM

## 2015-01-23 DIAGNOSIS — I10 Essential (primary) hypertension: Secondary | ICD-10-CM | POA: Diagnosis not present

## 2015-01-23 DIAGNOSIS — I2510 Atherosclerotic heart disease of native coronary artery without angina pectoris: Secondary | ICD-10-CM | POA: Insufficient documentation

## 2015-01-23 DIAGNOSIS — Z8673 Personal history of transient ischemic attack (TIA), and cerebral infarction without residual deficits: Secondary | ICD-10-CM | POA: Insufficient documentation

## 2015-01-23 DIAGNOSIS — J449 Chronic obstructive pulmonary disease, unspecified: Secondary | ICD-10-CM | POA: Diagnosis not present

## 2015-01-23 DIAGNOSIS — Z7982 Long term (current) use of aspirin: Secondary | ICD-10-CM | POA: Insufficient documentation

## 2015-01-23 DIAGNOSIS — Z8249 Family history of ischemic heart disease and other diseases of the circulatory system: Secondary | ICD-10-CM | POA: Diagnosis not present

## 2015-01-23 DIAGNOSIS — Z510 Encounter for antineoplastic radiation therapy: Secondary | ICD-10-CM | POA: Diagnosis not present

## 2015-01-23 DIAGNOSIS — Z955 Presence of coronary angioplasty implant and graft: Secondary | ICD-10-CM | POA: Diagnosis not present

## 2015-01-23 LAB — CBC
HEMATOCRIT: 37.6 % — AB (ref 39.0–52.0)
HEMOGLOBIN: 13 g/dL (ref 13.0–17.0)
MCH: 30.2 pg (ref 26.0–34.0)
MCHC: 34.6 g/dL (ref 30.0–36.0)
MCV: 87.2 fL (ref 78.0–100.0)
Platelets: 298 10*3/uL (ref 150–400)
RBC: 4.31 MIL/uL (ref 4.22–5.81)
RDW: 14.2 % (ref 11.5–15.5)
WBC: 12.7 10*3/uL — AB (ref 4.0–10.5)

## 2015-01-23 LAB — BASIC METABOLIC PANEL
ANION GAP: 10 (ref 5–15)
BUN: 26 mg/dL — AB (ref 6–20)
CHLORIDE: 96 mmol/L — AB (ref 101–111)
CO2: 31 mmol/L (ref 22–32)
Calcium: 9.4 mg/dL (ref 8.9–10.3)
Creatinine, Ser: 0.91 mg/dL (ref 0.61–1.24)
GFR calc Af Amer: >60 mL/min (ref >60)
GLUCOSE: 121 mg/dL — AB (ref 65–99)
POTASSIUM: 5.1 mmol/L (ref 3.5–5.1)
Sodium: 137 mmol/L (ref 135–145)

## 2015-01-23 LAB — PROTIME-INR
INR: 1.32 (ref 0.00–1.49)
Prothrombin Time: 16.5 seconds — ABNORMAL HIGH (ref 11.6–15.2)

## 2015-01-23 LAB — APTT: APTT: 36 s (ref 24–37)

## 2015-01-23 MED ORDER — LIDOCAINE-EPINEPHRINE (PF) 1 %-1:200000 IJ SOLN
INTRAMUSCULAR | Status: AC
  Filled 2015-01-23: qty 30

## 2015-01-23 MED ORDER — SODIUM CHLORIDE 0.9 % IV SOLN: Freq: Once | INTRAVENOUS | Status: DC

## 2015-01-23 MED ORDER — LIDOCAINE HCL (PF) 1 % IJ SOLN
INTRAMUSCULAR | Status: DC
Start: 2015-01-23 — End: 2015-01-24
  Filled 2015-01-23: qty 30

## 2015-01-23 MED ORDER — FENTANYL CITRATE (PF) 100 MCG/2ML IJ SOLN
INTRAMUSCULAR | Status: AC
  Filled 2015-01-23: qty 6

## 2015-01-23 MED ORDER — CEFAZOLIN SODIUM-DEXTROSE 2-3 GM-% IV SOLR
INTRAVENOUS | Status: AC
  Filled 2015-01-23: qty 50

## 2015-01-23 MED ORDER — MIDAZOLAM HCL 2 MG/2ML IJ SOLN
INTRAMUSCULAR | Status: AC
  Filled 2015-01-23: qty 6

## 2015-01-23 MED ORDER — CEFAZOLIN SODIUM-DEXTROSE 2-3 GM-% IV SOLR
2.0000 g | Freq: Once | INTRAVENOUS | Status: AC
  Administered 2015-01-23: 2 g via INTRAVENOUS

## 2015-01-23 MED ORDER — FENTANYL CITRATE (PF) 100 MCG/2ML IJ SOLN
INTRAMUSCULAR | Status: AC | PRN
  Administered 2015-01-23: 25 ug via INTRAVENOUS
  Administered 2015-01-23: 50 ug via INTRAVENOUS

## 2015-01-23 MED ORDER — MIDAZOLAM HCL 2 MG/2ML IJ SOLN
INTRAMUSCULAR | Status: AC | PRN
  Administered 2015-01-23: 0.5 mg via INTRAVENOUS
  Administered 2015-01-23: 1 mg via INTRAVENOUS

## 2015-01-23 MED ORDER — HEPARIN SOD (PORK) LOCK FLUSH 100 UNIT/ML IV SOLN
INTRAVENOUS | Status: AC
  Filled 2015-01-23: qty 5

## 2015-01-23 NOTE — Procedures (Signed)
R IJ Port cathter placement with US and fluoroscopy No complication No blood loss. See complete dictation in [facility] PACS.  

## 2015-01-23 NOTE — Discharge Instructions (Signed)

## 2015-01-23 NOTE — H&P (Signed)
Chief Complaint: Patient was seen in consultation today for esophageal cancer at the request of Feng,Yan  Referring Physician(s): Feng,Yan  History of Present Illness: Andre Guzman is a 69 y.o. male   Pt dx recently esophageal cancer Need for chemotherapy Scheduled now for [facility] A Cath placement   Past Medical History  Diagnosis Date  . Coronary artery disease   . Shortness of breath   . Seizures   . Hypertension   . Pneumonia   . COPD (chronic obstructive pulmonary disease)   . Stroke   . Esophageal cancer     Past Surgical History  Procedure Laterality Date  . Cardiac catheterization    . Coronary angioplasty with stent placement  07/22/2011  . Cardiac valve surgery    . Coronary artery bypass graft    . Lower extremity angiogram N/A 07/01/2011    Procedure: LOWER EXTREMITY ANGIOGRAM;  Surgeon: Laverda Page, MD;  Location: [facility] CATH LAB;  Service: Cardiovascular;  Laterality: N/A;  . Left heart catheterization with coronary angiogram N/A 07/01/2011    Procedure: LEFT HEART CATHETERIZATION WITH CORONARY ANGIOGRAM;  Surgeon: Laverda Page, MD;  Location: [facility] CATH LAB;  Service: Cardiovascular;  Laterality: N/A;  . Percutaneous coronary rotoblator intervention (pci-r) N/A 07/22/2011    Procedure: PERCUTANEOUS CORONARY ROTOBLATOR INTERVENTION (PCI-R);  Surgeon: Laverda Page, MD;  Location: [facility] CATH LAB;  Service: Cardiovascular;  Laterality: N/A;  . Left heart catheterization with coronary angiogram N/A 10/30/2011    Procedure: LEFT HEART CATHETERIZATION WITH CORONARY ANGIOGRAM;  Surgeon: Laverda Page, MD;  Location: [facility] CATH LAB;  Service: Cardiovascular;  Laterality: N/A;  . Esophagogastroduodenoscopy  01/02/15    Allergies: Review of patient's allergies indicates no known allergies.  Medications: Prior to Admission medications   Medication Sig Start Date End Date Taking? Authorizing Provider  albuterol (PROVENTIL HFA;VENTOLIN HFA) 108 (90 BASE) MCG/ACT  inhaler Inhale 1-2 puffs into the lungs every 6 (six) hours as needed for wheezing or shortness of breath.   Yes Historical Provider, MD  aspirin EC 81 MG tablet Take 81 mg by mouth daily.   Yes Historical Provider, MD  isosorbide mononitrate (IMDUR) 120 MG 24 hr tablet Take 1 tablet (120 mg total) by mouth daily. 11/13/13  Yes Adrian Prows, MD  phenytoin (DILANTIN) 100 MG ER capsule Take 100 mg by mouth 2 (two) times daily.   Yes Historical Provider, MD  Polyvinyl Alcohol-Povidone (REFRESH OP) Place 1 drop into both eyes daily as needed (dry eyes).   Yes Historical Provider, MD  pravastatin (PRAVACHOL) 40 MG tablet Take 40 mg by mouth every evening.  01/08/15  Yes Historical Provider, MD  terazosin (HYTRIN) 5 MG capsule Take 5 mg by mouth at bedtime.   Yes Historical Provider, MD  triamterene-hydrochlorothiazide (MAXZIDE-25) 37.5-25 MG per tablet Take 1 tablet by mouth daily. 11/13/13  Yes Adrian Prows, MD     Family History  Problem Relation Age of Onset  . Heart disease Mother   . Hypertension Mother   . Heart disease Sister   . Hypertension Sister   . Heart disease Brother   . Hypertension Daughter     Social History   Social History  . Marital Status: Married    Spouse Name: N/A  . Number of Children: 4  . Years of Education: N/A   Social History Main Topics  . Smoking status: Former Smoker -- 0.50 packs/day for 50 years    Types: Cigarettes    Quit date: 12/27/2014  .  Smokeless tobacco: Never Used  . Alcohol Use: 0.0 oz/week    0 Standard drinks or equivalent per week     Comment: occasional beer, 1-2 per week, used to drink daily heavily   . Drug Use: No  . Sexual Activity: Not Asked     Comment: did not ask   Other Topics Concern  . None   Social History Narrative   Married, wife [name]   Independent in ADLs   # 4 children ( 1 girl and 3 boys)    Review of Systems: A 12 point ROS discussed and pertinent positives are indicated in the HPI above.  All other systems are  negative.  Review of Systems  Constitutional: Positive for appetite change. Negative for fever and activity change.  HENT: Positive for trouble swallowing and voice change.   Respiratory: Negative for shortness of breath.   Cardiovascular: Negative for chest pain.  Gastrointestinal: Negative for abdominal pain.  Musculoskeletal: Negative for back pain.  Neurological: Negative for weakness.  Psychiatric/Behavioral: Negative for behavioral problems and confusion.    Vital Signs: BP 135/75 mmHg  Pulse 87  Temp(Src) 98.1 F (36.7 C) (Oral)  Resp 18  Ht 5\' 8"  (1.727 m)  Wt 133 lb (60.328 kg)  BMI 20.23 kg/m2  SpO2 99%  Physical Exam  Constitutional: He is oriented to person, place, and time.  Cardiovascular: Normal rate and regular rhythm.   No murmur heard. Pulmonary/Chest: Effort normal and breath sounds normal. He has no wheezes.  Abdominal: Soft. Bowel sounds are normal. There is no tenderness.  Musculoskeletal: Normal range of motion.  Neurological: He is alert and oriented to person, place, and time.  Skin: Skin is warm and dry.  Psychiatric: He has a normal mood and affect. His behavior is normal. Judgment and thought content normal.  Nursing note and vitals reviewed.   Mallampati Score:  MD Evaluation Airway: WNL Heart: WNL Abdomen: WNL Chest/ Lungs: WNL ASA  Classification: 3 Mallampati/Airway Score: One  Imaging: Ct Chest W Contrast  01/02/2015   CLINICAL DATA:  Patient status post endoscopy 01/02/2015. Difficulty swallowing. New diagnosis of esophageal cancer.  EXAM: CT CHEST, ABDOMEN, AND PELVIS WITH CONTRAST  TECHNIQUE: Multidetector CT imaging of the chest, abdomen and pelvis was performed following the standard protocol during bolus administration of intravenous contrast.  CONTRAST:  120mL ISOVUE-300 IOPAMIDOL (ISOVUE-300) INJECTION 61%  COMPARISON:  Upper GI 12/19/2014  FINDINGS: CT CHEST FINDINGS  Mediastinum/Nodes:  No axillary or supraclavicular  lymphadenopathy.  There is a long 9 cm segment of irregular esophageal wall thickening beginning several cm above the carina. There is an ulceration along the RIGHT wall of the esophagus which is gas-filled (image 27, series 2). The fat planes are ill-defined along this esophageal wall thickening. There is 1 defined lymph node measuring 13 mm adjacent the RIGHT bronchus intermedius on image 32, series 2.  Distal esophagus and GE junction appear normal.  Coronary calcifications are noted.  No pericardial fluid.  Lungs/Pleura:  3 pulmonary nodules:  3 mm RIGHT upper lobe pulmonary nodule on image 23, series 6  12 mm RIGHT middle lobe pulmonary nodule on image 39  7 mm lingular nodule on image 37  CT ABDOMEN AND PELVIS FINDINGS  Hepatobiliary:  No focal hepatic lesion.  Gallbladder is normal.  Pancreas: Pancreas is normal. No ductal dilatation. No pancreatic inflammation.  Spleen: Normal spleen  Adrenals/urinary tract: Adrenal glands and kidneys are normal. The bladder is distended. The prostate gland is enlarged indents the base  the bladder.  Stomach/Bowel: Stomach, duodenum, small bowel colon are unremarkable. Moderate volume stool in the rectum.  Vascular/Lymphatic: Abdominal aorta is calcified. No clear periportal adenopathy. Small gastrohepatic ligament nodes are present measuring up to 8 mm on image 55, series 2. small celiac node measures 6 mm on image 59 series 2. No retroperitoneal adenopathy. No pelvic adenopathy.  Reproductive: Prostate is enlarged indents the base of bladder  Musculoskeletal: Midline sternotomy.  No aggressive osseous lesion.  Other: No free fluid.  IMPRESSION: Chest Impression:  1. Long 9 cm segment of irregular esophageal wall thickening beginning above the carina consists with esophageal carcinoma. 2. Eccentric gas-filled ulceration along the RIGHT wall of the esophagus. 3. Ill-defined fat planes surrounding the thickened esophagus suggests infiltration into the mediastinum. 4. Single  enlarged subcarinal lymph node. 5. Bilateral pulmonary nodules (3) concerning for pulmonary metastasis.  Abdomen / Pelvis Impression:  1. Small gastrohepatic ligament lymph nodes. No clear evidence of metastatic adenopathy in the upper abdomen. 2. No evidence of liver metastasis 3. Atherosclerotic calcification aorta. 4. Prostate hypertrophy with bladder distension.   Electronically Signed   By: Suzy Bouchard M.D.   On: 01/02/2015 18:50   Ct Abdomen Pelvis W Contrast  01/02/2015   CLINICAL DATA:  Patient status post endoscopy 01/02/2015. Difficulty swallowing. New diagnosis of esophageal cancer.  EXAM: CT CHEST, ABDOMEN, AND PELVIS WITH CONTRAST  TECHNIQUE: Multidetector CT imaging of the chest, abdomen and pelvis was performed following the standard protocol during bolus administration of intravenous contrast.  CONTRAST:  157mL ISOVUE-300 IOPAMIDOL (ISOVUE-300) INJECTION 61%  COMPARISON:  Upper GI 12/19/2014  FINDINGS: CT CHEST FINDINGS  Mediastinum/Nodes:  No axillary or supraclavicular lymphadenopathy.  There is a long 9 cm segment of irregular esophageal wall thickening beginning several cm above the carina. There is an ulceration along the RIGHT wall of the esophagus which is gas-filled (image 27, series 2). The fat planes are ill-defined along this esophageal wall thickening. There is 1 defined lymph node measuring 13 mm adjacent the RIGHT bronchus intermedius on image 32, series 2.  Distal esophagus and GE junction appear normal.  Coronary calcifications are noted.  No pericardial fluid.  Lungs/Pleura:  3 pulmonary nodules:  3 mm RIGHT upper lobe pulmonary nodule on image 23, series 6  12 mm RIGHT middle lobe pulmonary nodule on image 39  7 mm lingular nodule on image 37  CT ABDOMEN AND PELVIS FINDINGS  Hepatobiliary:  No focal hepatic lesion.  Gallbladder is normal.  Pancreas: Pancreas is normal. No ductal dilatation. No pancreatic inflammation.  Spleen: Normal spleen  Adrenals/urinary tract: Adrenal  glands and kidneys are normal. The bladder is distended. The prostate gland is enlarged indents the base the bladder.  Stomach/Bowel: Stomach, duodenum, small bowel colon are unremarkable. Moderate volume stool in the rectum.  Vascular/Lymphatic: Abdominal aorta is calcified. No clear periportal adenopathy. Small gastrohepatic ligament nodes are present measuring up to 8 mm on image 55, series 2. small celiac node measures 6 mm on image 59 series 2. No retroperitoneal adenopathy. No pelvic adenopathy.  Reproductive: Prostate is enlarged indents the base of bladder  Musculoskeletal: Midline sternotomy.  No aggressive osseous lesion.  Other: No free fluid.  IMPRESSION: Chest Impression:  1. Long 9 cm segment of irregular esophageal wall thickening beginning above the carina consists with esophageal carcinoma. 2. Eccentric gas-filled ulceration along the RIGHT wall of the esophagus. 3. Ill-defined fat planes surrounding the thickened esophagus suggests infiltration into the mediastinum. 4. Single enlarged subcarinal lymph node. 5.  Bilateral pulmonary nodules (3) concerning for pulmonary metastasis.  Abdomen / Pelvis Impression:  1. Small gastrohepatic ligament lymph nodes. No clear evidence of metastatic adenopathy in the upper abdomen. 2. No evidence of liver metastasis 3. Atherosclerotic calcification aorta. 4. Prostate hypertrophy with bladder distension.   Electronically Signed   By: Suzy Bouchard M.D.   On: 01/02/2015 18:50   Nm Pet Image Initial (pi) Skull Base To Thigh  01/18/2015   CLINICAL DATA:  Initial treatment strategy for esophageal cancer.  EXAM: NUCLEAR MEDICINE PET SKULL BASE TO THIGH  TECHNIQUE: 6.45 mCi F-18 FDG was injected intravenously. Full-ring PET imaging was performed from the skull base to thigh after the radiotracer. CT data was obtained and used for attenuation correction and anatomic localization.  FASTING BLOOD GLUCOSE:  Value: 93 mg/dl  COMPARISON:  CT of the chest, abdomen and  pelvis 01/02/2015.  FINDINGS: NECK  No hypermetabolic lymph nodes in the neck.  CHEST  Again noted is a large long segment mass of the esophagus, which is diffusely hypermetabolic (SUVmax = 85.8). Subcarinal lymphadenopathy measuring 13 mm in short axis is hypermetabolic (SUVmax = 85.0). There is also some hypermetabolism in the right hilar region without well-defined nodal tissue (SUVmax = 8.0). Multiple pulmonary nodules are noted throughout the lungs bilaterally. Many of these are sub cm in size, and demonstrate no definite hypermetabolism. However, the largest nodules measuring 7 mm in the inferior segment of the lingula (image 46 of series 7), and 13 x 10 mm nodule in the medial segment of the right middle lobe (image 49 of series 7) are both hypermetabolic (SUVmax = 2.5 and 11.9 respectively), compatible with metastatic lesions. Heart size is normal. There is no significant pericardial fluid, thickening or pericardial calcification. There is atherosclerosis of the thoracic aorta, the great vessels of the mediastinum and the coronary arteries, including calcified atherosclerotic plaque in the left main, left anterior descending, left circumflex and right coronary arteries. Status post median sternotomy. High attenuation along the pericardium anterior to the right ventricular outflow tract is favored to be postoperative rather than intrinsic to the pericardium.  ABDOMEN/PELVIS  No abnormal hypermetabolic activity within the liver, pancreas, adrenal glands, or spleen. No hypermetabolic lymph nodes in the abdomen or pelvis. No significant volume of ascites. No pneumoperitoneum. No pathologic distention of small bowel. 2.4 cm diverticulum from the posterior lateral aspect of the right side of the urinary bladder. Atherosclerosis throughout the abdominal and pelvic vasculature.  SKELETON  Subtle area of sclerosis in the superior aspect of the right ilium (image 152 of series 4) demonstrates marked hypermetabolism  (SUVmax = 14.4), compatible with a metastasis. No other definite hypermetabolic lesions are noted in the visualized portions of the skeleton.  IMPRESSION: 1. Previously described esophageal mass is diffusely hypermetabolic. Today's study demonstrates associated hypermetabolic subcarinal and possible right hilar lymphadenopathy, several pulmonary nodules in the lungs bilaterally (largest of which are hypermetabolic), and a hypermetabolic lesion in the superior aspect of the right ilium, compatible with metastatic disease. 2. Atherosclerosis, including left main and 3 vessel coronary artery disease. 3. Additional incidental findings, as above.   Electronically Signed   By: Vinnie Langton M.D.   On: 01/18/2015 10:28    Labs:  CBC:  Recent Labs  01/10/15 1431 01/23/15 1051  WBC 9.0 12.7*  HGB 11.6* 13.0  HCT 33.6* 37.6*  PLT 291 298    COAGS:  Recent Labs  01/23/15 1051  INR 1.32  APTT 36    BMP:  Recent  Labs  01/10/15 1431 01/23/15 1051  NA 139 137  K 4.5 5.1  CL  --  96*  CO2 28 31  GLUCOSE 123 121*  BUN 13.3 26*  CALCIUM 9.5 9.4  CREATININE 0.9 0.91  GFRNONAA  --  >60  GFRAA  --  >60    LIVER FUNCTION TESTS:  Recent Labs  01/10/15 1431  BILITOT 0.29  AST 22  ALT 18  ALKPHOS 138  PROT 8.2  ALBUMIN 3.4*    TUMOR MARKERS: No results for input(s): AFPTM, CEA, CA199, CHROMGRNA in the last 8760 hours.  Assessment and Plan:  Newly dx esophageal cancer Scheduled to start chemo therapy soon Scheduled for PAC placement in IR Risks and Benefits discussed with the patient including, but not limited to bleeding, infection, pneumothorax, or fibrin sheath development and need for additional procedures. All of the patient's questions were answered, patient is agreeable to proceed. Consent signed and in chart.   Thank you for this interesting consult.  I greatly enjoyed meeting Andre Guzman and look forward to participating in their care.  A copy of this  report was sent to the requesting provider on this date.  Signed: Nilay Mangrum A 01/23/2015, 11:35 AM   I spent a total of  30 Minutes   in face to face in clinical consultation, greater than 50% of which was counseling/coordinating care for [facility] placement

## 2015-01-24 ENCOUNTER — Encounter: Payer: Self-pay | Admitting: Hematology

## 2015-01-24 ENCOUNTER — Ambulatory Visit
Admission: RE | Admit: 2015-01-24 | Discharge: 2015-01-24 | Disposition: A | Discharge status: Home / Self Care | Payer: Medicare Other | Source: Ambulatory Visit | Attending: Radiation Oncology | Admitting: Radiation Oncology

## 2015-01-24 ENCOUNTER — Other Ambulatory Visit: Payer: Medicare Other

## 2015-01-24 ENCOUNTER — Ambulatory Visit: Payer: Medicare Other | Admitting: Nutrition

## 2015-01-24 ENCOUNTER — Ambulatory Visit (HOSPITAL_COMMUNITY): Payer: Medicare Other

## 2015-01-24 ENCOUNTER — Ambulatory Visit: Payer: Medicare Other

## 2015-01-24 ENCOUNTER — Telehealth: Payer: Self-pay | Admitting: Hematology

## 2015-01-24 ENCOUNTER — Ambulatory Visit (HOSPITAL_BASED_OUTPATIENT_CLINIC_OR_DEPARTMENT_OTHER): Payer: Medicare Other | Admitting: Hematology

## 2015-01-24 VITALS — BP 107/69 | HR 103 | Temp 97.8°F | Resp 18 | Ht 68.0 in | Wt 123.2 lb

## 2015-01-24 DIAGNOSIS — R1310 Dysphagia, unspecified: Secondary | ICD-10-CM | POA: Diagnosis not present

## 2015-01-24 DIAGNOSIS — I2510 Atherosclerotic heart disease of native coronary artery without angina pectoris: Secondary | ICD-10-CM

## 2015-01-24 DIAGNOSIS — C155 Malignant neoplasm of lower third of esophagus: Secondary | ICD-10-CM

## 2015-01-24 DIAGNOSIS — Z510 Encounter for antineoplastic radiation therapy: Secondary | ICD-10-CM | POA: Diagnosis not present

## 2015-01-24 DIAGNOSIS — C7951 Secondary malignant neoplasm of bone: Secondary | ICD-10-CM | POA: Diagnosis not present

## 2015-01-24 DIAGNOSIS — I10 Essential (primary) hypertension: Secondary | ICD-10-CM

## 2015-01-24 DIAGNOSIS — C159 Malignant neoplasm of esophagus, unspecified: Secondary | ICD-10-CM

## 2015-01-24 NOTE — Progress Notes (Signed)
[facility]  [phone] Fax:([phone]) [phone]  Clinic Follow up Note   Patient Care Team: Glendale Chard, MD as PCP - General (Internal Medicine) Adrian Prows, MD as Consulting Physician (Cardiology) Jovita Gamma, MD as Consulting Physician (Neurosurgery) Lowella Bandy, MD as Consulting Physician (Urology) Grace Isaac, MD as Consulting Physician (Cardiothoracic Surgery) Truitt Merle, MD as Consulting Physician (Hematology) Kyung Rudd, MD as Consulting Physician (Radiation Oncology) Tania Ade, RN as Registered Nurse Carol Ada, MD as Consulting Physician (Gastroenterology) 01/24/2015  CHIEF COMPLAINTS/PURPOSE OF CONSULTATION:  Follow up esophageal squamous cell carcinoma    Squamous cell esophageal cancer   01/02/2015 Initial Diagnosis Squamous cell esophageal cancer   01/02/2015 Procedure EGD by Dr. Benson Norway showed a large, fungating mass with no bleeding and with stigmata met of recent bleeding was found in the third of the esophagus. The mass was completely obstructing and circumferential. The stomach and examined duodenum were normal.   01/02/2015 Imaging CT CAP showed 9 cm segment of irregular his social worker thickening. Single enlarged subcarinal lymph node, bilateral pulmonary nodules (3), 90mm right middle lobe next to right atrium, 3 RUL and a 7 mm lingular lung nodules.    01/02/2015 Initial Biopsy Esophageal mass biopsy showed poorly differentiated squamous cell carcinoma.    HISTORY OF PRESENTING ILLNESS:  Andre Guzman 69 y.o. male is here because of recently diagnosed esophageal squamous cell carcinoma.  He has had dysphagia for one month, with solid food mainly, he is able to keep liquid and soft diet down. He lost about 30 lbs in the the past month, but his weight has been stable in the past week since he started taking boost. He had some chest pain when he swallows. He is constipated, on stool softener, no bleeding.  He otherwise feels fine.  His energy level is fair, he functions very well, no limitation on his physical activities. No nausea, no other pain, cough or other, he drinks about 2 boost a day, soup,and some soft diet.  He was referred to gastroenterologist Dr. Benson Norway by his primary care physician. He underwent EGD which showed a circumferential, obstructing, fungating mass in the third esophagus, biopsy showed poorly differentiated squamous cell carcinoma. CT scan chest abdomen and pelvis showed 3 lung nodules, with the largest 1.2 cm in the right middle lobe next to the right atrium.  CURRENT THERAPY: pending radiation, scheduled to start on 01/23/15   [name] returns for follow-up, he presents to clinic with his daughter and son. His dysphagia is stable overall, he is tolerating liquid and soft food well. He drinks boost 3 cans a day, but he still lost about 10lbs in the past week. He has intermittent pain at right groin area for the past few years, not very frequent, stable. No other pain or new complains.   MEDICAL HISTORY:  Past Medical History  Diagnosis Date  . Coronary artery disease   . Shortness of breath   . Seizures   . Hypertension   . Pneumonia   . COPD (chronic obstructive pulmonary disease)   . Stroke   . Esophageal cancer     SURGICAL HISTORY: Past Surgical History  Procedure Laterality Date  . Cardiac catheterization    . Coronary angioplasty with stent placement  07/22/2011  . Cardiac valve surgery    . Coronary artery bypass graft    . Lower extremity angiogram N/A 07/01/2011    Procedure: LOWER EXTREMITY ANGIOGRAM;  Surgeon: Laverda Page, MD;  Location:  [facility] CATH LAB;  Service: Cardiovascular;  Laterality: N/A;  . Left heart catheterization with coronary angiogram N/A 07/01/2011    Procedure: LEFT HEART CATHETERIZATION WITH CORONARY ANGIOGRAM;  Surgeon: Laverda Page, MD;  Location: [facility] CATH LAB;  Service: Cardiovascular;  Laterality: N/A;  . Percutaneous coronary rotoblator  intervention (pci-r) N/A 07/22/2011    Procedure: PERCUTANEOUS CORONARY ROTOBLATOR INTERVENTION (PCI-R);  Surgeon: Laverda Page, MD;  Location: [facility] CATH LAB;  Service: Cardiovascular;  Laterality: N/A;  . Left heart catheterization with coronary angiogram N/A 10/30/2011    Procedure: LEFT HEART CATHETERIZATION WITH CORONARY ANGIOGRAM;  Surgeon: Laverda Page, MD;  Location: [facility] CATH LAB;  Service: Cardiovascular;  Laterality: N/A;  . Esophagogastroduodenoscopy  01/02/15    SOCIAL HISTORY: Social History   Social History  . Marital Status: Married, lives with his wife     Spouse Name: N/A  . Number of Children: 7, 1 daughter and 3 sons, all lives in [place]   . Years of Education: N/A   Occupational History  . Retired Nature conservation officer    Social History Main Topics  . Smoking status: Current Every Day Smoker -- 0.50 packs/day for 50 years    Types: Cigarettes  . Smokeless tobacco: Never Used  . 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 Drug Use: No  . Sexual Activity: Not on file     Comment: did not ask   Other Topics Concern  . Not on file   Social History Narrative    FAMILY HISTORY: Family History  Problem Relation Age of Onset  . Heart disease Mother   . Hypertension Mother   . Heart disease Sister   . Hypertension Sister   . Heart disease Brother   . Hypertension Daughter     ALLERGIES:  has No Known Allergies.  MEDICATIONS:  Current Outpatient Prescriptions  Medication Sig Dispense Refill  . albuterol (PROVENTIL HFA;VENTOLIN HFA) 108 (90 BASE) MCG/ACT inhaler Inhale 1-2 puffs into the lungs every 6 (six) hours as needed for wheezing or shortness of breath.    . isosorbide mononitrate (IMDUR) 120 MG 24 hr tablet Take 1 tablet (120 mg total) by mouth daily. 30 tablet 2  . phenytoin (DILANTIN) 100 MG ER capsule Take 100 mg by mouth 2 (two) times daily.    . Polyvinyl Alcohol-Povidone (REFRESH OP) Place  1 drop into both eyes daily as needed (dry eyes).    . pravastatin (PRAVACHOL) 40 MG tablet Take 40 mg by mouth every evening.     . terazosin (HYTRIN) 10 MG capsule Take 10 mg by mouth daily.  0  . triamterene-hydrochlorothiazide (MAXZIDE-25) 37.5-25 MG per tablet Take 1 tablet by mouth daily. 30 tablet 2  . aspirin EC 81 MG tablet Take 81 mg by mouth daily.    Marland Kitchen PRESCRIPTION MEDICATION Chemo [facility]     No current facility-administered medications for this visit.    REVIEW OF SYSTEMS:   Constitutional: Denies fevers, chills or abnormal night sweats, (+) weight loss  Eyes: Denies blurriness of vision, double vision or watery eyes Ears, nose, mouth, throat, and face: Denies mucositis or sore throat Respiratory: Denies cough, dyspnea or wheezes Cardiovascular: Denies palpitation, chest discomfort or lower extremity swelling Gastrointestinal:  (+) Dysphasia, mild heartburn, Denies nausea, vomiting or change in bowel habits Skin: Denies abnormal skin rashes Lymphatics: Denies new lymphadenopathy or easy bruising Neurological:Denies numbness, tingling or new weaknesses Behavioral/Psych: Mood is  stable, no new changes  All other systems were reviewed with the patient and are negative.  PHYSICAL EXAMINATION: ECOG PERFORMANCE STATUS: 1 - Symptomatic but completely ambulatory  Filed Vitals:   01/24/15 0937  BP: 107/69  Pulse: 103  Temp: 97.8 F (36.6 C)  Resp: 18   Filed Weights   01/24/15 0937  Weight: 123 lb 3.2 oz (55.883 kg)    GENERAL:alert, no distress and comfortable SKIN: skin color, texture, turgor are normal, no rashes or significant lesions EYES: normal, conjunctiva are pink and non-injected, sclera clear OROPHARYNX:no exudate, no erythema and lips, buccal mucosa, and tongue normal  NECK: supple, thyroid normal size, non-tender, without nodularity LYMPH:  no palpable lymphadenopathy in the cervical, axillary or inguinal LUNGS: clear to auscultation and percussion with  normal breathing effort HEART: regular rate & rhythm and no murmurs and no lower extremity edema ABDOMEN:abdomen soft, non-tender and normal bowel sounds Musculoskeletal:no cyanosis of digits and no clubbing  PSYCH: alert & oriented x 3 with fluent speech NEURO: no focal motor/sensory deficits  LABORATORY DATA:  I have reviewed the data as listed CBC Latest Ref Rng 01/23/2015 01/10/2015 11/11/2013  WBC 4.0 - 10.5 K/uL 12.7(H) 9.0 5.0  Hemoglobin 13.0 - 17.0 g/dL 13.0 11.6(L) 11.9(L)  Hematocrit 39.0 - 52.0 % 37.6(L) 33.6(L) 34.8(L)  Platelets 150 - 400 K/uL 298 291 172    CMP Latest Ref Rng 01/23/2015 01/10/2015 11/13/2013  Glucose 65 - 99 mg/dL 121(H) 123 87  BUN 6 - 20 mg/dL 26(H) 13.3 19  Creatinine 0.61 - 1.24 mg/dL 0.91 0.9 0.99  Sodium 135 - 145 mmol/L 137 139 136(L)  Potassium 3.5 - 5.1 mmol/L 5.1 4.5 4.2  Chloride 101 - 111 mmol/L 96(L) - 95(L)  CO2 22 - 32 mmol/L $RemoveB'31 28 27  '[id number]$ Calcium 8.9 - 10.3 mg/dL 9.4 9.5 8.5  Total Protein 6.4 - 8.3 g/dL - 8.2 -  Total Bilirubin 0.20 - 1.20 mg/dL - 0.29 -  Alkaline Phos 40 - 150 U/L - 138 -  AST 5 - 34 U/L - 22 -  ALT 0 - 55 U/L - 18 -      RADIOGRAPHIC STUDIES: I have personally reviewed the radiological images as listed and agreed with the findings in the report.  Ct Chest, abdomen and pelvis W Contrast 01/02/2015    IMPRESSION: Chest Impression:  1. Long 9 cm segment of irregular esophageal wall thickening beginning above the carina consists with esophageal carcinoma. 2. Eccentric gas-filled ulceration along the RIGHT wall of the esophagus. 3. Ill-defined fat planes surrounding the thickened esophagus suggests infiltration into the mediastinum. 4. Single enlarged subcarinal lymph node. 5. Bilateral pulmonary nodules (3) concerning for pulmonary metastasis.  Abdomen / Pelvis Impression:  1. Small gastrohepatic ligament lymph nodes. No clear evidence of metastatic adenopathy in the upper abdomen. 2. No evidence of liver metastasis 3.  Atherosclerotic calcification aorta. 4. Prostate hypertrophy with bladder distension.   Electronically Signed   By: Suzy Bouchard M.D.   On: 01/02/2015 18:50     Dg Esophagus  12/19/2014   CLINICAL DATA:  Patient with pain in the distal esophagus. Patient feels like food is stuck in the esophagus for 1 month.  EXAM: ESOPHOGRAM / BARIUM SWALLOW / BARIUM TABLET STUDY  TECHNIQUE: Combined double contrast and single contrast examination performed using effervescent crystals, thick barium liquid, and thin barium liquid. The patient was observed with fluoroscopy swallowing a 13 mm barium sulphate tablet.  FLUOROSCOPY TIME:  Fluoroscopy Time:  3 minutes, 25  seconds  COMPARISON:  Chest radiograph 12/07/2014  FINDINGS: Examination of the esophagus demonstrates a long segment of esophageal irregularity and narrowing at the level of the midesophagus. This masslike irregularity and narrowing persists on multiple projections. The GE junction is grossly unremarkable. There is spontaneous full column gastroesophageal reflux during the examination. Moderate esophageal dysmotility. The barium tablet did not pass through the level of abnormality within the midesophagus. The stomach is grossly normal in appearance.  IMPRESSION: Long segment irregularity and masslike thickening of the midesophagus concerning for primary esophageal carcinoma. This needs dedicated evaluation with upper endoscopy.  These results will be called to the ordering clinician or representative by the Radiologist Assistant, and communication documented in the PACS or zVision Dashboard.   Electronically Signed   By: Lovey Newcomer M.D.   On: 12/19/2014 11:21   PET 01/18/2015 FINDINGS: NECK  No hypermetabolic lymph nodes in the neck.  CHEST  Again noted is a large long segment mass of the esophagus, which is diffusely hypermetabolic (SUVmax = 18.5). Subcarinal lymphadenopathy measuring 13 mm in short axis is hypermetabolic (SUVmax = 63.1). There is  also some hypermetabolism in the right hilar region without well-defined nodal tissue (SUVmax = 8.0). Multiple pulmonary nodules are noted throughout the lungs bilaterally. Many of these are sub cm in size, and demonstrate no definite hypermetabolism. However, the largest nodules measuring 7 mm in the inferior segment of the lingula (image 46 of series 7), and 13 x 10 mm nodule in the medial segment of the right middle lobe (image 49 of series 7) are both hypermetabolic (SUVmax = 2.5 and 11.9 respectively), compatible with metastatic lesions. Heart size is normal. There is no significant pericardial fluid, thickening or pericardial calcification. There is atherosclerosis of the thoracic aorta, the great vessels of the mediastinum and the coronary arteries, including calcified atherosclerotic plaque in the left main, left anterior descending, left circumflex and right coronary arteries. Status post median sternotomy. High attenuation along the pericardium anterior to the right ventricular outflow tract is favored to be postoperative rather than intrinsic to the pericardium.  ABDOMEN/PELVIS  No abnormal hypermetabolic activity within the liver, pancreas, adrenal glands, or spleen. No hypermetabolic lymph nodes in the abdomen or pelvis. No significant volume of ascites. No pneumoperitoneum. No pathologic distention of small bowel. 2.4 cm diverticulum from the posterior lateral aspect of the right side of the urinary bladder. Atherosclerosis throughout the abdominal and pelvic vasculature.  SKELETON  Subtle area of sclerosis in the superior aspect of the right ilium (image 152 of series 4) demonstrates marked hypermetabolism (SUVmax = 14.4), compatible with a metastasis. No other definite hypermetabolic lesions are noted in the visualized portions of the skeleton.  IMPRESSION: 1. Previously described esophageal mass is diffusely hypermetabolic. Today's study demonstrates  associated hypermetabolic subcarinal and possible right hilar lymphadenopathy, several pulmonary nodules in the lungs bilaterally (largest of which are hypermetabolic), and a hypermetabolic lesion in the superior aspect of the right ilium, compatible with metastatic disease. 2. Atherosclerosis, including left main and 3 vessel coronary artery disease. 3. Additional incidental findings, as above.   ASSESSMENT & PLAN: 69 year old African-American male, with past medical history of hypertension, history of stroke, coronary artery disease, who presents with progressive dysphasia.  1. 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 -he is scheduled for IR biopsy the right iliac bone lesion this  Friday. -We reviewed the natural course of esophagitis cancer, and the incurable nature of his disease if metastatic is confirmed. We emphasized the goal of therapy is palliation and prolonged his life. -I have spoken with radiation oncologist Dr. Sondra Come, and he agrees to shorten the duration of radiation to 3 weeks, since the goal of therapy is palliative. He is going to start tomorrow  -I'll plan to start chemotherapy after he completes radiation, with first-line FOLFOX. -We discussed the role of feeding tube. If he has worsening dysphasia, he will need nasogastric feeding tube or J-tube. -he is meeting dietician Pamala Hurry today   2. Dysphagia and weight loss -secondary to #1 -more weight loss last week -follow up with dietician, I encourage him to drink more nutrition supplement   3. HTN, CAD, history of seizure and CVA -Follow up with primary care physician -we discussed that we will watch his BP closely, and may need adjust his meds during chemo   Plan -He is scheduled to start palliative radiation tomorrow 9/8 -IR CT-guided biopsy of right  iliac bone on 9/9 -I will see him back in 3 weeks when he is close to finish his radiation   All questions were answered. The patient knows to call the clinic with any problems, questions or concerns. I spent 20 minutes counseling the patient face to face. The total time spent in the appointment was 25 minutes and more than 50% was on counseling.     Truitt Merle, MD 01/24/2015 9:43 PM

## 2015-01-24 NOTE — Progress Notes (Signed)
69 year old male diagnosed with Esophageal Cancer.  He is a patient of Dr. Burr Medico.  PMH includes CAD, SOB, Seizures, PNA, COPD, Stroke.  Medications were reviewed.  Labs include glucose of 121 on Sept 6.  Height: 68 inches. Weight: 123.2 pounds on Sept 7. UBW: 130 pounds in August.  170 pounds in 2013. BMI: 18.74.  Met with patient and son and daughter in exam room. Patient reports difficulty swallowing solid foods.  He tolerates Boost or Ensure without difficulty and consumes about 3 daily. He complains of constipation. Patient endorsed recent weight loss.  Nutrition Diagnosis:  Unintended weight loss related to new diagnosis of esophageal cancer as evidenced by 28% weight loss from UBW and BMI of 18.74.  Intervention: Recommended increase oral nutrition supplements to QID. Educated patient on soft moist foods to consume at meals. Educated patient and family on pureeing foods and adding appropriate liquid to make appropriate consistency. Reviewed strategies for improving constipation. Provided fact sheets on increasing calories and protein, constipation, and swallowing difficulties. Provided coupons and my contact information. Samples provided.  Questions answered and teach back method used.  Monitoring, Evaluation, Goals: Patient will increase oral intake to minimize further weight loss.  Next Visit: Tuesday, September 20.

## 2015-01-24 NOTE — Progress Notes (Signed)
Checked in pt for front desk for dr.apptmt.Pt signed AOB.Pt request to be billed for copay. Pt asked about fin assist, currently not in Chemo but starting Radiation.Referred to [facility] for fin concerns at this time but has my card.

## 2015-01-24 NOTE — Telephone Encounter (Signed)
Gave adn printed appt sched and avs for pt for Sept and OCT °

## 2015-01-24 NOTE — Progress Notes (Signed)
  Radiation Oncology         [phone] ________________________________  Name: Andre Guzman MRN: [id number]  Date: 01/24/2015  DOB: 01-Jul-1945  Simulation Verification Note    ICD-9-CM ICD-10-CM   1. Squamous cell esophageal cancer 150.9 C15.9     Status: outpatient  NARRATIVE: The patient was brought to the treatment unit and placed in the planned treatment position. The clinical setup was verified. Then port films were obtained and uploaded to the radiation oncology medical record software.  The treatment beams were carefully compared against the planned radiation fields. The position location and shape of the radiation fields was reviewed. They targeted volume of tissue appears to be appropriately covered by the radiation beams. Organs at risk appear to be excluded as planned.  Based on my personal review, I approved the simulation verification. The patient's treatment will proceed as planned.  -----------------------------------  Blair Promise, PhD, MD

## 2015-01-25 ENCOUNTER — Ambulatory Visit
Admission: RE | Admit: 2015-01-25 | Discharge: 2015-01-25 | Disposition: A | Discharge status: Home / Self Care | Payer: Medicare Other | Source: Ambulatory Visit | Attending: Radiation Oncology | Admitting: Radiation Oncology

## 2015-01-25 ENCOUNTER — Other Ambulatory Visit: Payer: Self-pay | Admitting: Radiology

## 2015-01-25 DIAGNOSIS — Z510 Encounter for antineoplastic radiation therapy: Secondary | ICD-10-CM | POA: Diagnosis not present

## 2015-01-26 ENCOUNTER — Ambulatory Visit (HOSPITAL_COMMUNITY)
Admission: RE | Admit: 2015-01-26 | Discharge: 2015-01-26 | Disposition: A | Discharge status: Home / Self Care | Payer: Medicare Other | Source: Ambulatory Visit | Attending: Hematology | Admitting: Hematology

## 2015-01-26 ENCOUNTER — Ambulatory Visit
Admission: RE | Admit: 2015-01-26 | Discharge: 2015-01-26 | Disposition: A | Discharge status: Home / Self Care | Payer: Medicare Other | Source: Ambulatory Visit | Attending: Radiation Oncology | Admitting: Radiation Oncology

## 2015-01-26 ENCOUNTER — Encounter (HOSPITAL_COMMUNITY): Payer: Self-pay

## 2015-01-26 DIAGNOSIS — C159 Malignant neoplasm of esophagus, unspecified: Secondary | ICD-10-CM | POA: Diagnosis present

## 2015-01-26 DIAGNOSIS — Z510 Encounter for antineoplastic radiation therapy: Secondary | ICD-10-CM | POA: Diagnosis not present

## 2015-01-26 LAB — APTT: aPTT: 29 seconds (ref 24–37)

## 2015-01-26 LAB — CBC WITH DIFFERENTIAL/PLATELET
Basophils Absolute: 0 10*3/uL (ref 0.0–0.1)
Basophils Relative: 0 % (ref 0–1)
Eosinophils Absolute: 0.1 10*3/uL (ref 0.0–0.7)
Eosinophils Relative: 1 % (ref 0–5)
HCT: 34.5 % — ABNORMAL LOW (ref 39.0–52.0)
Hemoglobin: 11.7 g/dL — ABNORMAL LOW (ref 13.0–17.0)
Lymphocytes Relative: 23 % (ref 12–46)
Lymphs Abs: 2 10*3/uL (ref 0.7–4.0)
MCH: 29.7 pg (ref 26.0–34.0)
MCHC: 33.9 g/dL (ref 30.0–36.0)
MCV: 87.6 fL (ref 78.0–100.0)
Monocytes Absolute: 0.6 10*3/uL (ref 0.1–1.0)
Monocytes Relative: 7 % (ref 3–12)
Neutro Abs: 6 10*3/uL (ref 1.7–7.7)
Neutrophils Relative %: 69 % (ref 43–77)
Platelets: 295 10*3/uL (ref 150–400)
RBC: 3.94 MIL/uL — ABNORMAL LOW (ref 4.22–5.81)
RDW: 14.1 % (ref 11.5–15.5)
WBC: 8.7 10*3/uL (ref 4.0–10.5)

## 2015-01-26 LAB — PROTIME-INR
INR: 1.19 (ref 0.00–1.49)
Prothrombin Time: 15.2 seconds (ref 11.6–15.2)

## 2015-01-26 MED ORDER — MIDAZOLAM HCL 2 MG/2ML IJ SOLN
INTRAMUSCULAR | Status: AC | PRN
  Administered 2015-01-26: 1 mg via INTRAVENOUS

## 2015-01-26 MED ORDER — FENTANYL CITRATE (PF) 100 MCG/2ML IJ SOLN
INTRAMUSCULAR | Status: AC
  Filled 2015-01-26: qty 2

## 2015-01-26 MED ORDER — SODIUM CHLORIDE 0.9 % IV SOLN
INTRAVENOUS | Status: DC
  Administered 2015-01-26: 07:00:00 via INTRAVENOUS

## 2015-01-26 MED ORDER — HYDROCODONE-ACETAMINOPHEN 5-325 MG PO TABS: 1.0000 | ORAL_TABLET | ORAL | Status: DC | PRN

## 2015-01-26 MED ORDER — FENTANYL CITRATE (PF) 100 MCG/2ML IJ SOLN
INTRAMUSCULAR | Status: AC | PRN
  Administered 2015-01-26: 50 ug via INTRAVENOUS

## 2015-01-26 MED ORDER — MIDAZOLAM HCL 2 MG/2ML IJ SOLN
INTRAMUSCULAR | Status: AC
  Filled 2015-01-26: qty 2

## 2015-01-26 NOTE — Discharge Instructions (Signed)
Conscious Sedation, Adult, Care After °Refer to this sheet in the next few weeks. These instructions provide you with information on caring for yourself after your procedure. Your health care provider may also give you more specific instructions. Your treatment has been planned according to current medical practices, but problems sometimes occur. Call your health care provider if you have any problems or questions after your procedure. °WHAT TO EXPECT AFTER THE PROCEDURE  °After your procedure: °· You may feel sleepy, clumsy, and have poor balance for several hours. °· Vomiting may occur if you eat too soon after the procedure. °HOME CARE INSTRUCTIONS °· Do not participate in any activities where you could become injured for at least 24 hours. Do not: °· Drive. °· Swim. °· Ride a bicycle. °· Operate heavy machinery. °· Cook. °· Use power tools. °· Climb ladders. °· Work from a high place. °· Do not make important decisions or sign legal documents until you are improved. °· If you vomit, drink water, juice, or soup when you can drink without vomiting. Make sure you have little or no nausea before eating solid foods. °· Only take over-the-counter or prescription medicines for pain, discomfort, or fever as directed by your health care provider. °· Make sure you and your family fully understand everything about the medicines given to you, including what side effects may occur. °· You should not drink alcohol, take sleeping pills, or take medicines that cause drowsiness for at least 24 hours. °· If you smoke, do not smoke without supervision. °· If you are feeling better, you may resume normal activities 24 hours after you were sedated. °· Keep all appointments with your health care provider. °SEEK MEDICAL CARE IF: °· Your skin is pale or bluish in color. °· You continue to feel nauseous or vomit. °· Your pain is getting worse and is not helped by medicine. °· You have bleeding or swelling. °· You are still sleepy or  feeling clumsy after 24 hours. °SEEK IMMEDIATE MEDICAL CARE IF: °· You develop a rash. °· You have difficulty breathing. °· You develop any type of allergic problem. °· You have a fever. °MAKE SURE YOU: °· Understand these instructions. °· Will watch your condition. °· Will get help right away if you are not doing well or get worse. °Document Released: 02/23/2013 Document Reviewed: 02/23/2013 °Exit[employer]® Patient Information ©2015 [employer]. This information is not intended to replace advice given to you by your health care provider. Make sure you discuss any questions you have with your health care provider. ° °Bone Marrow Aspiration, Bone Marrow Biopsy °Care After °Read the instructions outlined below and refer to this sheet in the next few weeks. These discharge instructions provide you with general information on caring for yourself after you leave the hospital. Your caregiver may also give you specific instructions. While your treatment has been planned according to the most current medical practices available, unavoidable complications occasionally occur. If you have any problems or questions after discharge, call your caregiver. °FINDING OUT THE RESULTS OF YOUR TEST °Not all test results are available during your visit. If your test results are not back during the visit, make an appointment with your caregiver to find out the results. Do not assume everything is normal if you have not heard from your caregiver or the medical facility. It is important for you to follow up on all of your test results.  °HOME CARE INSTRUCTIONS  °You have had sedation and may be sleepy or dizzy. Your thinking   may not be as clear as usual. For the next 24 hours: °· Only take over-the-counter or prescription medicines for pain, discomfort, and or fever as directed by your caregiver. °· Do not drink alcohol. °· Do not smoke. °· Do not drive. °· Do not make important legal decisions. °· Do not operate heavy machinery. °· Do not  care for small children by yourself. °· Keep your dressing clean and dry. You may replace dressing with a bandage after 24 hours. °· You may take a bath or shower after 24 hours. °· Use an ice pack for 20 minutes every 2 hours while awake for pain as needed. °SEEK MEDICAL CARE IF:  °· There is redness, swelling, or increasing pain at the biopsy site. °· There is pus coming from the biopsy site. °· There is drainage from a biopsy site lasting longer than one day. °· An unexplained oral temperature above 102° F (38.9° C) develops. °SEEK IMMEDIATE MEDICAL CARE IF:  °· You develop a rash. °· You have difficulty breathing. °· You develop any reaction or side effects to medications given. °Document Released: 11/22/2004 Document Revised: 07/28/2011 Document Reviewed: 05/02/2008 °Exit[employer]® Patient Information ©2015 [employer]. This information is not intended to replace advice given to you by your health care provider. Make sure you discuss any questions you have with your health care provider. ° °

## 2015-01-26 NOTE — Procedures (Signed)
CT R iliac deep bone core boipsy 13g to surg path No complication No blood loss. See complete dictation in [facility].

## 2015-01-26 NOTE — Progress Notes (Signed)
Patient ID: Andre Guzman, male   DOB: September 04, 1945, 69 y.o.   MRN: [id number]    Referring Physician(s): Feng,Yan  Chief Complaint:  Esophageal cancer  Subjective:  Pt familiar to IR service from recent port a cath placement on 01/23/2015. He has history of squamous cell cancer of esophagus and recent PET scan revealing a hypermetabolic lesion in superior aspect of the right ilium. He presents today for CT guided biopsy of the right ilium lesion. He denies fever, chills, HA, dyspnea, cough, abd/back pain,N/V or abnormal bleeding. He has some soreness at rt chest port site as well as dysphagia.  Allergies: Review of patient's allergies indicates no known allergies.  Medications: Prior to Admission medications   Medication Sig Start Date End Date Taking? Authorizing Provider  albuterol (PROVENTIL HFA;VENTOLIN HFA) 108 (90 BASE) MCG/ACT inhaler Inhale 1-2 puffs into the lungs every 6 (six) hours as needed for wheezing or shortness of breath.   Yes Historical Provider, MD  aspirin EC 81 MG tablet Take 81 mg by mouth daily.   Yes Historical Provider, MD  isosorbide mononitrate (IMDUR) 120 MG 24 hr tablet Take 1 tablet (120 mg total) by mouth daily. 11/13/13  Yes Adrian Prows, MD  phenytoin (DILANTIN) 100 MG ER capsule Take 100 mg by mouth 2 (two) times daily.   Yes Historical Provider, MD  Polyvinyl Alcohol-Povidone (REFRESH OP) Place 1 drop into both eyes daily as needed (dry eyes).   Yes Historical Provider, MD  pravastatin (PRAVACHOL) 40 MG tablet Take 40 mg by mouth every evening.  01/08/15  Yes Historical Provider, MD  terazosin (HYTRIN) 10 MG capsule Take 10 mg by mouth daily. 01/19/15  Yes Historical Provider, MD  triamterene-hydrochlorothiazide (MAXZIDE-25) 37.5-25 MG per tablet Take 1 tablet by mouth daily. 11/13/13  Yes Adrian Prows, MD  [place]    Historical Provider, MD     Vital Signs: BP 116/72 mmHg  Pulse 94  Temp(Src) 98.5 F (36.9 C) (Oral)  Resp 18  Ht  5\' 8"  (1.727 m)  Wt 123 lb (55.792 kg)  BMI 18.71 kg/m2  SpO2 100%  Physical Exam  Constitutional: He is oriented to person, place, and time. He appears well-developed and well-nourished.  Cardiovascular: Normal rate and regular rhythm.   Clean, intact rt chest wall PAC, mildly tender  Pulmonary/Chest: Effort normal.  few coarse BS bilat  Abdominal: Soft. Bowel sounds are normal. There is no tenderness.  Musculoskeletal: Normal range of motion. He exhibits no edema.  Neurological: He is alert and oriented to person, place, and time.    Imaging: Ir Fluoro Guide Cv Line Right  01/23/2015   CLINICAL DATA:  Squamous cell carcinoma of the esophagus, needs venous access for chemotherapy  EXAM: TUNNELED PORT CATHETER PLACEMENT WITH ULTRASOUND AND FLUOROSCOPIC GUIDANCE  FLUOROSCOPY TIME:  12 seconds ; 2.7 mGy  ANESTHESIA/SEDATION: Intravenous Fentanyl and Versed were administered as conscious sedation during continuous cardiorespiratory monitoring by the radiology RN, with a total moderate sedation time of 16 minutes.  TECHNIQUE: The procedure, risks, benefits, and alternatives were explained to the patient. Questions regarding the procedure were encouraged and answered. The patient understands and consents to the procedure. As antibiotic prophylaxis, cefazolin was ordered pre-procedure and administered intravenously within one hour of incision. Patency of the right IJ vein was confirmed with ultrasound with image documentation. An appropriate skin site was determined. Skin site was marked. Region was prepped using maximum barrier technique including cap and mask, sterile gown, sterile gloves, large sterile sheet,  and Chlorhexidine as cutaneous antisepsis. The region was infiltrated locally with 1% lidocaine. Under real-time ultrasound guidance, the right IJ vein was accessed with a 21 gauge micropuncture needle; the needle tip within the vein was confirmed with ultrasound image documentation. Needle was  exchanged over a 018 guidewire for transitional dilator which allowed passage of the [facility] wire into the IVC. Over this, the transitional dilator was exchanged for a 5 [place] MPA catheter. A small incision was made on the right anterior chest wall and a subcutaneous pocket fashioned. The power-injectable port was positioned and its catheter tunneled to the right IJ dermatotomy site. The MPA catheter was exchanged over an Amplatz wire for a peel-away sheath, through which the port catheter, which had been trimmed to the appropriate length, was advanced and positioned under fluoroscopy with its tip at the cavoatrial junction. Spot chest radiograph confirms good catheter position and no pneumothorax. The pocket was closed with deep interrupted and subcuticular continuous 3-0 Monocryl sutures. The port was flushed per protocol. The incisions were covered with Dermabond then covered with a sterile dressing.  COMPLICATIONS: COMPLICATIONS None immediate  IMPRESSION: Technically successful right IJ power-injectable port catheter placement. Ready for routine use.   Electronically Signed   By: Lucrezia Europe M.D.   On: 01/23/2015 13:53   Ir US Guide Vasc Access Right  01/23/2015   CLINICAL DATA:  Squamous cell carcinoma of the esophagus, needs venous access for chemotherapy  EXAM: TUNNELED PORT CATHETER PLACEMENT WITH ULTRASOUND AND FLUOROSCOPIC GUIDANCE  FLUOROSCOPY TIME:  12 seconds ; 2.7 mGy  ANESTHESIA/SEDATION: Intravenous Fentanyl and Versed were administered as conscious sedation during continuous cardiorespiratory monitoring by the radiology RN, with a total moderate sedation time of 16 minutes.  TECHNIQUE: The procedure, risks, benefits, and alternatives were explained to the patient. Questions regarding the procedure were encouraged and answered. The patient understands and consents to the procedure. As antibiotic prophylaxis, cefazolin was ordered pre-procedure and administered intravenously within one hour of  incision. Patency of the right IJ vein was confirmed with ultrasound with image documentation. An appropriate skin site was determined. Skin site was marked. Region was prepped using maximum barrier technique including cap and mask, sterile gown, sterile gloves, large sterile sheet, and Chlorhexidine as cutaneous antisepsis. The region was infiltrated locally with 1% lidocaine. Under real-time ultrasound guidance, the right IJ vein was accessed with a 21 gauge micropuncture needle; the needle tip within the vein was confirmed with ultrasound image documentation. Needle was exchanged over a 018 guidewire for transitional dilator which allowed passage of the [facility] wire into the IVC. Over this, the transitional dilator was exchanged for a 5 [place] MPA catheter. A small incision was made on the right anterior chest wall and a subcutaneous pocket fashioned. The power-injectable port was positioned and its catheter tunneled to the right IJ dermatotomy site. The MPA catheter was exchanged over an Amplatz wire for a peel-away sheath, through which the port catheter, which had been trimmed to the appropriate length, was advanced and positioned under fluoroscopy with its tip at the cavoatrial junction. Spot chest radiograph confirms good catheter position and no pneumothorax. The pocket was closed with deep interrupted and subcuticular continuous 3-0 Monocryl sutures. The port was flushed per protocol. The incisions were covered with Dermabond then covered with a sterile dressing.  COMPLICATIONS: COMPLICATIONS None immediate  IMPRESSION: Technically successful right IJ power-injectable port catheter placement. Ready for routine use.   Electronically Signed   By: Lucrezia Europe M.D.   On: 01/23/2015  13:53    Labs:  CBC:  Recent Labs  01/10/15 1431 01/23/15 1051 01/26/15 0707  WBC 9.0 12.7* 8.7  HGB 11.6* 13.0 11.7*  HCT 33.6* 37.6* 34.5*  PLT 291 298 295    COAGS:  Recent Labs  01/23/15 1051 01/26/15 0707    INR 1.32 1.19  APTT 36 29    BMP:  Recent Labs  01/10/15 1431 01/23/15 1051  NA 139 137  K 4.5 5.1  CL  --  96*  CO2 28 31  GLUCOSE 123 121*  BUN 13.3 26*  CALCIUM 9.5 9.4  CREATININE 0.9 0.91  GFRNONAA  --  >60  GFRAA  --  >60    LIVER FUNCTION TESTS:  Recent Labs  01/10/15 1431  BILITOT 0.29  AST 22  ALT 18  ALKPHOS 138  PROT 8.2  ALBUMIN 3.4*    Assessment and Plan: Pt with hx esophageal cancer and finding of hypermetabolic right ilium lesion on PET scan; plan is for CT guided biopsy of the lesion today. Risks and benefits discussed with the patient/family including, but not limited to bleeding, infection, damage to adjacent structures or low yield requiring additional tests.All of the patient's questions were answered, patient is agreeable to proceed.Consent signed and in chart.     Signed: D. Rowe Robert 01/26/2015, 8:28 AM   I spent a total of 15 minutes at the the patient's bedside AND on the patient's hospital floor or unit, greater than 50% of which was counseling/coordinating care for CT guided right ilium lesion biopsy

## 2015-01-29 ENCOUNTER — Ambulatory Visit
Admission: RE | Admit: 2015-01-29 | Discharge: 2015-01-29 | Disposition: A | Discharge status: Home / Self Care | Payer: Medicare Other | Source: Ambulatory Visit | Attending: Radiation Oncology | Admitting: Radiation Oncology

## 2015-01-29 DIAGNOSIS — Z510 Encounter for antineoplastic radiation therapy: Secondary | ICD-10-CM | POA: Diagnosis not present

## 2015-01-30 ENCOUNTER — Ambulatory Visit
Admission: RE | Admit: 2015-01-30 | Discharge: 2015-01-30 | Disposition: A | Discharge status: Home / Self Care | Payer: Medicare Other | Source: Ambulatory Visit | Attending: Radiation Oncology | Admitting: Radiation Oncology

## 2015-01-30 ENCOUNTER — Encounter: Payer: Self-pay | Admitting: Radiation Oncology

## 2015-01-30 VITALS — BP 129/84 | HR 92 | Temp 98.6°F | Resp 16 | Ht 68.0 in | Wt 120.9 lb

## 2015-01-30 DIAGNOSIS — C159 Malignant neoplasm of esophagus, unspecified: Secondary | ICD-10-CM

## 2015-01-30 DIAGNOSIS — Z510 Encounter for antineoplastic radiation therapy: Secondary | ICD-10-CM | POA: Diagnosis not present

## 2015-01-30 MED ORDER — SUCRALFATE 1 G PO TABS: 1.0000 g | ORAL_TABLET | Freq: Three times a day (TID) | ORAL | Status: DC

## 2015-01-30 MED ORDER — RADIAPLEXRX EX GEL: Freq: Once | CUTANEOUS | Status: DC

## 2015-01-30 MED ORDER — HYDROCODONE-ACETAMINOPHEN 7.5-325 MG/15ML PO SOLN: 10.0000 mL | Freq: Four times a day (QID) | ORAL | Status: DC | PRN

## 2015-01-30 NOTE — Progress Notes (Signed)
Pt here for patient teaching.  Pt given Radiation and You booklet, skin care instructions and Radiaplex gel. Pt reports they have not watched the Radiation Therapy Education video.  Reviewed areas of pertinence such as fatigue, skin changes and throat changes . Pt able to give teach back of to pat skin and use unscented/gentle soap,apply Radiaplex bid and avoid applying anything to skin within 4 hours of treatment. Pt demonstrated understanding and verbalizes understanding of information given and will contact nursing with any questions or concerns.    Pt states they have not watched the Radiation Therapy Education.  On January 30, 2015 video presented and watched. [email or web address]/whattoexpect/index

## 2015-01-30 NOTE — Progress Notes (Signed)
Lesslie Mckeehan has completed 4 fractions to his esophagus.  He reports discomfort in his central chest at night when he lies down.  He also reports he is taking 2 tylenol before he eats because it is painful to swallow.  He is now eating softer and blended foods.  His weight is down 3 lbs from 01/26/15.    BP 129/84 mmHg  Pulse 92  Temp(Src) 98.6 F (37 C) (Oral)  Resp 16  Ht 5\' 8"  (1.727 m)  Wt 120 lb 14.4 oz (54.84 kg)  BMI 18.39 kg/m2  SpO2 100%   Wt Readings from Last 3 Encounters:  01/30/15 120 lb 14.4 oz (54.84 kg)  01/26/15 123 lb (55.792 kg)  01/24/15 123 lb 3.2 oz (55.883 kg)

## 2015-01-30 NOTE — Progress Notes (Signed)
  Radiation Oncology         [phone] ________________________________  Name: Andre Guzman MRN: [id number]  Date: 01/30/2015  DOB: 1945-09-21  Weekly Radiation Therapy Management    ICD-9-CM ICD-10-CM   1. Squamous cell esophageal cancer 150.9 C15.9     Current Dose: 10 Gy     Planned Dose:  35 Gy  Narrative . . . . . . . . The patient presents for routine under treatment assessment.                                  He reports discomfort in his central chest at night when he lies down. He also reports he is taking 2 tylenol before he eats because it is painful to swallow. He is now eating softer and blended foods. His weight is down 3 lbs from 01/26/15.He did meet with the nutritionist last week.                                 Set-up films were reviewed.                                 The chart was checked. Physical Findings. . .  height is 5\' 8"  (1.727 m) and weight is 120 lb 14.4 oz (54.84 kg). His oral temperature is 98.6 F (37 C). His blood pressure is 129/84 and his pulse is 92. His respiration is 16 and oxygen saturation is 100%. . The lungs are clear. The heart has a regular rhythm and rate. The abdomen is soft and nontender with normal bowel sounds. Impression . . . . . . . The patient is tolerating radiation. Plan . . . . . . . . . . . . Continue treatment as planned. Prescriptions for Hycet and Carafate to be used as a suspension given today.  ________________________________   Blair Promise, PhD, MD

## 2015-01-31 ENCOUNTER — Ambulatory Visit
Admission: RE | Admit: 2015-01-31 | Discharge: 2015-01-31 | Disposition: A | Discharge status: Home / Self Care | Payer: Medicare Other | Source: Ambulatory Visit | Attending: Radiation Oncology | Admitting: Radiation Oncology

## 2015-01-31 DIAGNOSIS — Z510 Encounter for antineoplastic radiation therapy: Secondary | ICD-10-CM | POA: Diagnosis not present

## 2015-02-01 ENCOUNTER — Ambulatory Visit
Admission: RE | Admit: 2015-02-01 | Discharge: 2015-02-01 | Disposition: A | Discharge status: Home / Self Care | Payer: Medicare Other | Source: Ambulatory Visit | Attending: Radiation Oncology | Admitting: Radiation Oncology

## 2015-02-01 DIAGNOSIS — Z510 Encounter for antineoplastic radiation therapy: Secondary | ICD-10-CM | POA: Diagnosis not present

## 2015-02-02 ENCOUNTER — Ambulatory Visit
Admission: RE | Admit: 2015-02-02 | Discharge: 2015-02-02 | Disposition: A | Discharge status: Home / Self Care | Payer: Medicare Other | Source: Ambulatory Visit | Attending: Radiation Oncology | Admitting: Radiation Oncology

## 2015-02-02 DIAGNOSIS — Z510 Encounter for antineoplastic radiation therapy: Secondary | ICD-10-CM | POA: Diagnosis not present

## 2015-02-05 ENCOUNTER — Ambulatory Visit
Admission: RE | Admit: 2015-02-05 | Discharge: 2015-02-05 | Disposition: A | Discharge status: Home / Self Care | Payer: Medicare Other | Source: Ambulatory Visit | Attending: Radiation Oncology | Admitting: Radiation Oncology

## 2015-02-05 DIAGNOSIS — Z510 Encounter for antineoplastic radiation therapy: Secondary | ICD-10-CM | POA: Diagnosis not present

## 2015-02-06 ENCOUNTER — Encounter: Payer: Medicare Other | Admitting: Nutrition

## 2015-02-06 ENCOUNTER — Ambulatory Visit
Admission: RE | Admit: 2015-02-06 | Discharge: 2015-02-06 | Disposition: A | Discharge status: Home / Self Care | Payer: Medicare Other | Source: Ambulatory Visit | Attending: Radiation Oncology | Admitting: Radiation Oncology

## 2015-02-06 ENCOUNTER — Encounter: Payer: Self-pay | Admitting: Nutrition

## 2015-02-06 ENCOUNTER — Encounter: Payer: Self-pay | Admitting: Radiation Oncology

## 2015-02-06 VITALS — BP 121/80 | HR 111 | Temp 98.2°F | Ht 68.0 in | Wt 123.3 lb

## 2015-02-06 DIAGNOSIS — Z510 Encounter for antineoplastic radiation therapy: Secondary | ICD-10-CM | POA: Diagnosis not present

## 2015-02-06 DIAGNOSIS — C159 Malignant neoplasm of esophagus, unspecified: Secondary | ICD-10-CM

## 2015-02-06 MED ORDER — HYDROCODONE-ACETAMINOPHEN 7.5-325 MG/15ML PO SOLN: 10.0000 mL | Freq: Four times a day (QID) | ORAL | Status: DC | PRN

## 2015-02-06 NOTE — Progress Notes (Signed)
Patient did not show up for nutrition follow-up.   Noted weight is stable and documented as 123.3 pounds September 20. Patient had initial assessment completed on September 7.   He was given my contact information.   No follow-up has been scheduled.

## 2015-02-06 NOTE — Progress Notes (Signed)
Kendrew Paci has completed 9 fractions to his esophagus.  He reports pain with swallowing in his abdomen that feels like "pins and needles."  He said this lasts for a few minutes and happens when he swallows anything.  He reports the hycet is helping him eat more.  He is having to take 2 tablets of tylenol at night to sleep.  He reports the carafate is not helping and the tablets do not dissolve completely.  His weight is stable this week  He denies fatigue and skin irritation.  He is also wondering what the results of his biopsy are.  BP 121/80 mmHg  Pulse 111  Temp(Src) 98.2 F (36.8 C) (Oral)  Ht 5\' 8"  (1.727 m)  Wt 123 lb 4.8 oz (55.929 kg)  BMI 18.75 kg/m2  SpO2 100%

## 2015-02-06 NOTE — Progress Notes (Signed)
  Radiation Oncology         (336) [phone] ________________________________  Name: Andre Guzman MRN: [id number]  Date: 02/06/2015  DOB: [birth year]  Weekly Radiation Therapy Management  Squamous cell esophageal cancer    Current Dose: 22.5 Gy     Planned Dose:  35 Gy  Narrative . . . . . . . . The patient presents for routine under treatment assessment.                                    He reports pain with swallowing in his abdomen that feels like "pins and needles." He said this lasts for a few minutes and happens when he swallows anything. He reports the hycet is helping him eat more. He is having to take 2 tablets of tylenol at night to sleep. He reports the carafate is not helping and the tablets do not dissolve completely.                                 Set-up films were reviewed.                                 The chart was checked. Physical Findings. . .  height is 5\' 8"  (1.727 m) and weight is 123 lb 4.8 oz (55.929 kg). His oral temperature is 98.2 F (36.8 C). His blood pressure is 121/80 and his pulse is 111. His oxygen saturation is 100%. . The lungs are clear. The heart has a regular r and rate. Impression . . . . . . . The patient is tolerating radiation. Plan . . . . . . . . . . . . Continue treatment as planned.  ________________________________   Blair Promise, PhD, MD

## 2015-02-07 ENCOUNTER — Ambulatory Visit
Admission: RE | Admit: 2015-02-07 | Discharge: 2015-02-07 | Disposition: A | Discharge status: Home / Self Care | Payer: Medicare Other | Source: Ambulatory Visit | Attending: Radiation Oncology | Admitting: Radiation Oncology

## 2015-02-07 DIAGNOSIS — Z510 Encounter for antineoplastic radiation therapy: Secondary | ICD-10-CM | POA: Diagnosis not present

## 2015-02-08 ENCOUNTER — Ambulatory Visit
Admission: RE | Admit: 2015-02-08 | Discharge: 2015-02-08 | Disposition: A | Discharge status: Home / Self Care | Payer: Medicare Other | Source: Ambulatory Visit | Attending: Radiation Oncology | Admitting: Radiation Oncology

## 2015-02-08 DIAGNOSIS — Z510 Encounter for antineoplastic radiation therapy: Secondary | ICD-10-CM | POA: Diagnosis not present

## 2015-02-09 ENCOUNTER — Telehealth: Payer: Self-pay | Admitting: Hematology

## 2015-02-09 ENCOUNTER — Ambulatory Visit
Admission: RE | Admit: 2015-02-09 | Discharge: 2015-02-09 | Disposition: A | Discharge status: Home / Self Care | Payer: Medicare Other | Source: Ambulatory Visit | Attending: Radiation Oncology | Admitting: Radiation Oncology

## 2015-02-09 DIAGNOSIS — Z510 Encounter for antineoplastic radiation therapy: Secondary | ICD-10-CM | POA: Diagnosis not present

## 2015-02-09 NOTE — Telephone Encounter (Signed)
per Dr Burr Medico to move pt to 11:15 am slot-cld pt & left a message of r/s rime & date

## 2015-02-12 ENCOUNTER — Ambulatory Visit
Admission: RE | Admit: 2015-02-12 | Discharge: 2015-02-12 | Disposition: A | Discharge status: Home / Self Care | Payer: Medicare Other | Source: Ambulatory Visit | Attending: Radiation Oncology | Admitting: Radiation Oncology

## 2015-02-12 DIAGNOSIS — Z510 Encounter for antineoplastic radiation therapy: Secondary | ICD-10-CM | POA: Diagnosis not present

## 2015-02-13 ENCOUNTER — Telehealth: Payer: Self-pay | Admitting: *Deleted

## 2015-02-13 ENCOUNTER — Ambulatory Visit (HOSPITAL_BASED_OUTPATIENT_CLINIC_OR_DEPARTMENT_OTHER): Payer: Medicare Other | Admitting: Hematology

## 2015-02-13 ENCOUNTER — Ambulatory Visit: Payer: Medicare Other

## 2015-02-13 ENCOUNTER — Encounter: Payer: Self-pay | Admitting: Radiation Oncology

## 2015-02-13 ENCOUNTER — Other Ambulatory Visit: Payer: Medicare Other

## 2015-02-13 ENCOUNTER — Ambulatory Visit
Admission: RE | Admit: 2015-02-13 | Discharge: 2015-02-13 | Disposition: A | Discharge status: Home / Self Care | Payer: Medicare Other | Source: Ambulatory Visit | Attending: Radiation Oncology | Admitting: Radiation Oncology

## 2015-02-13 ENCOUNTER — Other Ambulatory Visit (HOSPITAL_BASED_OUTPATIENT_CLINIC_OR_DEPARTMENT_OTHER): Payer: Medicare Other

## 2015-02-13 ENCOUNTER — Ambulatory Visit: Payer: Medicare Other | Admitting: Hematology

## 2015-02-13 ENCOUNTER — Encounter: Payer: Self-pay | Admitting: Hematology

## 2015-02-13 ENCOUNTER — Telehealth: Payer: Self-pay | Admitting: Hematology

## 2015-02-13 VITALS — BP 86/58 | HR 110 | Temp 98.8°F | Resp 18 | Ht 68.0 in | Wt 121.6 lb

## 2015-02-13 VITALS — BP 110/69 | HR 109 | Temp 98.4°F | Resp 16 | Ht 68.0 in | Wt 121.6 lb

## 2015-02-13 DIAGNOSIS — C159 Malignant neoplasm of esophagus, unspecified: Secondary | ICD-10-CM | POA: Diagnosis not present

## 2015-02-13 DIAGNOSIS — Z510 Encounter for antineoplastic radiation therapy: Secondary | ICD-10-CM | POA: Diagnosis not present

## 2015-02-13 LAB — COMPREHENSIVE METABOLIC PANEL (CC13)
ALBUMIN: 2.9 g/dL — AB (ref 3.5–5.0)
ALK PHOS: 178 U/L — AB (ref 40–150)
ALT: 35 U/L (ref 0–55)
ANION GAP: 10 meq/L (ref 3–11)
AST: 30 U/L (ref 5–34)
BUN: 18.6 mg/dL (ref 7.0–26.0)
CALCIUM: 9.2 mg/dL (ref 8.4–10.4)
CHLORIDE: 99 meq/L (ref 98–109)
CO2: 30 mEq/L — ABNORMAL HIGH (ref 22–29)
Creatinine: 0.9 mg/dL (ref 0.7–1.3)
Glucose: 145 mg/dl — ABNORMAL HIGH (ref 70–140)
POTASSIUM: 3.8 meq/L (ref 3.5–5.1)
Sodium: 139 mEq/L (ref 136–145)
Total Bilirubin: 0.33 mg/dL (ref 0.20–1.20)
Total Protein: 8 g/dL (ref 6.4–8.3)

## 2015-02-13 LAB — CBC & DIFF AND RETIC
BASO%: 0.1 % (ref 0.0–2.0)
BASOS ABS: 0 10*3/uL (ref 0.0–0.1)
EOS%: 0.5 % (ref 0.0–7.0)
Eosinophils Absolute: 0 10*3/uL (ref 0.0–0.5)
HEMATOCRIT: 34.6 % — AB (ref 38.4–49.9)
HGB: 11.8 g/dL — ABNORMAL LOW (ref 13.0–17.1)
Immature Retic Fract: 2 % — ABNORMAL LOW (ref 3.00–10.60)
LYMPH%: 9.4 % — AB (ref 14.0–49.0)
MCH: 30 pg (ref 27.2–33.4)
MCHC: 34.1 g/dL (ref 32.0–36.0)
MCV: 88 fL (ref 79.3–98.0)
MONO#: 0.8 10*3/uL (ref 0.1–0.9)
MONO%: 9.5 % (ref 0.0–14.0)
NEUT#: 6.6 10*3/uL — ABNORMAL HIGH (ref 1.5–6.5)
NEUT%: 80.5 % — AB (ref 39.0–75.0)
PLATELETS: 261 10*3/uL (ref 140–400)
RBC: 3.93 10*6/uL — ABNORMAL LOW (ref 4.20–5.82)
RDW: 13.9 % (ref 11.0–14.6)
RETIC %: 0.84 % (ref 0.80–1.80)
Retic Ct Abs: 33.01 10*3/uL — ABNORMAL LOW (ref 34.80–93.90)
WBC: 8.2 10*3/uL (ref 4.0–10.3)
lymph#: 0.8 10*3/uL — ABNORMAL LOW (ref 0.9–3.3)

## 2015-02-13 NOTE — Telephone Encounter (Signed)
Per staff message and POF I have scheduled appts. Advised scheduler of appts and to move labs. JMW  

## 2015-02-13 NOTE — Telephone Encounter (Signed)
per pof to sch pt appt-sent Mw email to sch trmt & pump d/c-pt to get updated copy of sch @ chemo class 9/29

## 2015-02-13 NOTE — Progress Notes (Signed)
[place]  [phone] [phone]  Clinic Follow up Note   Patient Care Team: Glendale Chard, MD as PCP - General (Internal Medicine) Adrian Prows, MD as Consulting Physician (Cardiology) Jovita Gamma, MD as Consulting Physician (Neurosurgery) Lowella Bandy, MD as Consulting Physician (Urology) Grace Isaac, MD as Consulting Physician (Cardiothoracic Surgery) Truitt Merle, MD as Consulting Physician (Hematology) Tania Ade, RN as Registered Nurse Carol Ada, MD as Consulting Physician (Gastroenterology) Gery Pray, MD as Consulting Physician (Radiation Oncology) 02/14/2015  CHIEF COMPLAINTS:  Follow up esophageal squamous cell carcinoma  Oncology History   Squamous cell esophageal cancer   Staging form: Esophagus - Squamous Cell Carcinoma, AJCC 7th Edition     Clinical: Stage IV (TX, N0, M1) - Unsigned       Squamous cell esophageal cancer   01/02/2015 Initial Diagnosis Squamous cell esophageal cancer   01/02/2015 Procedure EGD by Dr. Benson Norway showed a large, fungating mass with no bleeding and with stigmata met of recent bleeding was found in the third of the esophagus. The mass was completely obstructing and circumferential. The stomach and examined duodenum were normal.   01/02/2015 Imaging CT CAP showed 9 cm segment of irregular his social worker thickening. Single enlarged subcarinal lymph node, bilateral pulmonary nodules (3), 69mm right middle lobe next to right atrium, 3 RUL and a 7 mm lingular lung nodules.    01/02/2015 Initial Biopsy Esophageal mass biopsy showed poorly differentiated squamous cell carcinoma.   01/18/2015 Imaging PET scan showed hypermetabolic esophageal mass, subcarinal and possible right hilar adenopathy, several lung nodules, and a hypermetabolic bone lesion in right ilium, compatible with metastatic disease.    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 male is here because of recently diagnosed esophageal squamous cell carcinoma.  He has had dysphagia for one month, with solid food mainly, he is able to keep liquid and soft diet down. He lost about 30 lbs in the the past month, but his weight has been stable in the past week since he started taking boost. He had some chest pain when he swallows. He is constipated, on stool softener, no bleeding.  He otherwise feels fine. His energy level is fair, he functions very well, no limitation on his physical activities. No nausea, no other pain, cough or other, he drinks about 2 boost a day, soup,and some soft diet.  He was referred to gastroenterologist Dr. Benson Norway by his primary care physician. He underwent EGD which showed a circumferential, obstructing, fungating mass in the third esophagus, biopsy showed poorly differentiated squamous cell carcinoma. CT scan chest abdomen and pelvis showed 3 lung nodules, with the largest 1.2 cm in the right middle lobe next to the right atrium.  CURRENT THERAPY: pending radiation, scheduled to start on 01/23/15   Andre Guzman returns for follow-up, he had his last dose of radiation today. He tolerated radiation overall well. He does seem to have more oral secretion and saliva, she needs to spit some time. He states his dysphagia is stable, he states able to tolerate liquid and soft diet well, he drinks ensure 2 cans a day.Marland Kitchen He is able to smaller small pills. His chest pain is moderate, he takes hydrocodone every 6 hours. No other new pain or other complaints. Weight is stable. He  underwent right iliac bone biopsy on 01/26/2015 and tolerated well.  MEDICAL HISTORY:  Past Medical History  Diagnosis Date  . Coronary artery disease   . Shortness of breath   . Seizures   . Hypertension   . Pneumonia   . COPD  (chronic obstructive pulmonary disease)   . Stroke   . Esophageal cancer     SURGICAL HISTORY: Past Surgical History  Procedure Laterality Date  . Cardiac catheterization    . Coronary angioplasty with stent placement  07/22/2011  . Cardiac valve surgery    . Coronary artery bypass graft    . Lower extremity angiogram N/A 07/01/2011    Procedure: LOWER EXTREMITY ANGIOGRAM;  Surgeon: Laverda Page, MD;  Location: [facility] CATH LAB;  Service: Cardiovascular;  Laterality: N/A;  . Left heart catheterization with coronary angiogram N/A 07/01/2011    Procedure: LEFT HEART CATHETERIZATION WITH CORONARY ANGIOGRAM;  Surgeon: Laverda Page, MD;  Location: [facility] CATH LAB;  Service: Cardiovascular;  Laterality: N/A;  . Percutaneous coronary rotoblator intervention (pci-r) N/A 07/22/2011    Procedure: PERCUTANEOUS CORONARY ROTOBLATOR INTERVENTION (PCI-R);  Surgeon: Laverda Page, MD;  Location: [facility] CATH LAB;  Service: Cardiovascular;  Laterality: N/A;  . Left heart catheterization with coronary angiogram N/A 10/30/2011    Procedure: LEFT HEART CATHETERIZATION WITH CORONARY ANGIOGRAM;  Surgeon: Laverda Page, MD;  Location: [facility] CATH LAB;  Service: Cardiovascular;  Laterality: N/A;  . Esophagogastroduodenoscopy  01/02/15    SOCIAL HISTORY: Social History   Social History  . Marital Status: Married, lives with his wife     Spouse Name: N/A  . Number of Children: 54, 1 daughter and 3 sons, all lives in [place]   . Years of Education: N/A   Occupational History  . Retired Nature conservation officer    Social History Main Topics  . Smoking status: Current Every Day Smoker -- 0.50 packs/day for 50 years    Types: Cigarettes  . Smokeless tobacco: Never Used  . 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 Drug Use: No  . Sexual Activity: Not on file     Comment: did not ask   Other Topics Concern  . Not on file   Social History Narrative    FAMILY  HISTORY: Family History  Problem Relation Age of Onset  . Heart disease Mother   . Hypertension Mother   . Heart disease Sister   . Hypertension Sister   . Heart disease Brother   . Hypertension Daughter     ALLERGIES:  has No Known Allergies.  MEDICATIONS:  Current Outpatient Prescriptions  Medication Sig Dispense Refill  . albuterol (PROVENTIL HFA;VENTOLIN HFA) 108 (90 BASE) MCG/ACT inhaler Inhale 1-2 puffs into the lungs every 6 (six) hours as needed for wheezing or shortness of breath.    Marland Kitchen aspirin EC 81 MG tablet Take 81 mg by mouth daily.    . hyaluronate sodium (RADIAPLEXRX) GEL Apply 1 application topically 2 (two) times daily.    . isosorbide mononitrate (IMDUR) 120 MG 24 hr tablet Take 1 tablet (120 mg total) by mouth daily. 30 tablet 2  . phenytoin (DILANTIN) 100 MG ER capsule Take 100 mg by mouth 2 (two) times daily.    . Polyvinyl Alcohol-Povidone (REFRESH OP) Place 1 drop into both eyes daily as needed (dry eyes).    . pravastatin (PRAVACHOL) 40 MG tablet Take 40 mg by mouth every evening.     Marland Kitchen  PRESCRIPTION MEDICATION Chemo [facility]    . sucralfate (CARAFATE) 1 G tablet Take 1 tablet (1 g total) by mouth 4 (four) times daily -  with meals and at bedtime. 50 tablet 1  . terazosin (HYTRIN) 10 MG capsule Take 10 mg by mouth daily.  0  . triamterene-hydrochlorothiazide (MAXZIDE-25) 37.5-25 MG per tablet Take 1 tablet by mouth daily. 30 tablet 2  . HYDROcodone-acetaminophen (HYCET) 7.5-325 mg/15 ml solution Take 10 mLs by mouth 4 (four) times daily as needed for moderate pain. 473 mL 0  . morphine 20 MG/5ML solution Take 1 mL (4 mg total) by mouth every 6 (six) hours as needed for pain. 100 mL 0   No current facility-administered medications for this visit.    REVIEW OF SYSTEMS:   Constitutional: Denies fevers, chills or abnormal night sweats, (+) weight loss  Eyes: Denies blurriness of vision, double vision or watery eyes Ears, nose, mouth, throat, and face: Denies  mucositis or sore throat Respiratory: Denies cough, dyspnea or wheezes Cardiovascular: Denies palpitation, chest discomfort or lower extremity swelling Gastrointestinal:  (+) Dysphasia, mild heartburn, Denies nausea, vomiting or change in bowel habits Skin: Denies abnormal skin rashes Lymphatics: Denies new lymphadenopathy or easy bruising Neurological:Denies numbness, tingling or new weaknesses Behavioral/Psych: Mood is stable, no new changes  All other systems were reviewed with the patient and are negative.  PHYSICAL EXAMINATION: ECOG PERFORMANCE STATUS: 1 - Symptomatic but completely ambulatory  Filed Vitals:   02/13/15 1140  BP: 86/58  Pulse: 110  Temp: 98.8 F (37.1 C)  Resp: 18   Filed Weights   02/13/15 1140  Weight: 121 lb 9.6 oz (55.157 kg)    GENERAL:alert, no distress and comfortable SKIN: skin color, texture, turgor are normal, no rashes or significant lesions EYES: normal, conjunctiva are pink and non-injected, sclera clear OROPHARYNX:no exudate, no erythema and lips, buccal mucosa, and tongue normal  NECK: supple, thyroid normal size, non-tender, without nodularity LYMPH:  no palpable lymphadenopathy in the cervical, axillary or inguinal LUNGS: clear to auscultation and percussion with normal breathing effort HEART: regular rate & rhythm and no murmurs and no lower extremity edema ABDOMEN:abdomen soft, non-tender and normal bowel sounds Musculoskeletal:no cyanosis of digits and no clubbing  PSYCH: alert & oriented x 3 with fluent speech NEURO: no focal motor/sensory deficits  LABORATORY DATA:  I have reviewed the data as listed CBC Latest Ref Rng 02/13/2015 01/26/2015 01/23/2015  WBC 4.0 - 10.3 10e3/uL 8.2 8.7 12.7(H)  Hemoglobin 13.0 - 17.1 g/dL 11.8(L) 11.7(L) 13.0  Hematocrit 38.4 - 49.9 % 34.6(L) 34.5(L) 37.6(L)  Platelets 140 - 400 10e3/uL 261 295 298    CMP Latest Ref Rng 02/13/2015 01/23/2015 01/10/2015  Glucose 70 - 140 mg/dl 145(H) 121(H) 123  BUN 7.0  - 26.0 mg/dL 18.6 26(H) 13.3  Creatinine 0.7 - 1.3 mg/dL 0.9 0.91 0.9  Sodium 136 - 145 mEq/L 139 137 139  Potassium 3.5 - 5.1 mEq/L 3.8 5.1 4.5  Chloride 101 - 111 mmol/L - 96(L) -  CO2 22 - 29 mEq/L 30(H) 31 28  Calcium 8.4 - 10.4 mg/dL 9.2 9.4 9.5  Total Protein 6.4 - 8.3 g/dL 8.0 - 8.2  Total Bilirubin 0.20 - 1.20 mg/dL 0.33 - 0.29  Alkaline Phos 40 - 150 U/L 178(H) - 138  AST 5 - 34 U/L 30 - 22  ALT 0 - 55 U/L 35 - 18   Pathology report  Diagnosis 01/26/2015 Bone, biopsy, r iliac - 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  Microscopic Comment The metastatic carcinoma is positive by immunohistochemistry with p63, cytokeratin 903 and cytokeratin 5/6. The tumor is negative with CD56, synaptophysin, chromogranin, thyroid transcription factor-1 and S100. (JDP:gt, 01/30/15)   RADIOGRAPHIC STUDIES: I have personally reviewed the radiological images as listed and agreed with the findings in the report.  PET 01/18/2015 IMPRESSION: 1. Previously described esophageal mass is diffusely hypermetabolic. Today's study demonstrates associated hypermetabolic subcarinal and possible right hilar lymphadenopathy, several pulmonary nodules in the lungs bilaterally (largest of which are hypermetabolic), and a hypermetabolic lesion in the superior aspect of the right ilium, compatible with metastatic disease. 2. Atherosclerosis, including left main and 3 vessel coronary artery disease. 3. Additional incidental findings, as above.   ASSESSMENT & PLAN: 69 year old African-American male, with past medical history of hypertension, history of stroke, coronary artery disease, who presents with progressive dysphasia.  1. 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 -I discussed his right iliac bone biopsy results with patient and  his son, unfortunately biopsy confirmed metastatic squamous cell carcinoma. -We reviewed the natural course of esophagitis cancer, and the incurable nature of his disease. We emphasized the goal of therapy is palliation and prolonged his life. -I have spoken with radiation oncologist Dr. Sondra Come, and he agrees to shorten the duration of radiation to 3 weeks, he just finished today. -I'll plan to start chemotherapy next week, with first-line FOLFOX. -We discussed the role of feeding tube. If he has worsening dysphasia, he may need feeding tube, likely PEG  -he will follow up dietician Pamala Hurry    2. Dysphagia and weight loss -secondary to #1 -follow up with dietician, I encourage him to drink more nutrition supplement   3. HTN, CAD, history of seizure and CVA -Follow up with primary care physician -we discussed that we will watch his BP closely, and may need adjust his meds during chemo   4. Chest pain, secondary to his esophageal cancer and radiation -He is tolerating hydrocodone well, he takes 4 times a day. -He is can start chemotherapy next week, I'll change his hydrocodone/Tylenol liquid morphine  5. Hypotension -His blood pressure was 86/58 today, he denies any dizziness or other symptoms. -This is likely secondary to dehydration -I recommend IV fluids, he cannot do take, will come back in 2 days for IV fluids. -I told him to hold terazosin for now   Plan -start FOLFOX next week -I gave him a prescription of liquid morphine  -1L NS in 2 days  All questions were answered. The patient knows to call the clinic with any problems, questions or concerns. I spent 20 minutes counseling the patient face to face. The total time spent in the appointment was 25 minutes and more than 50% was on counseling.     Truitt Merle, MD 02/14/2015 11:40 PM

## 2015-02-13 NOTE — Progress Notes (Signed)
Riad Wagley has completed treatment with 14 fractions to his esophagus.  He denies pain today.  He does report that food is getting stuck in his chest.  He has to take small bites of softer foods and drink liquids after to make sure they go down.  He is taking hycet and Carafate 4 times a day.    His bp was 86/58 in Dr. Ernestina Penna office today.  He is going to get IV fluids on Thursday. He will start chemotherapy next week.  He reports fatigue.  He has hyperpigmentation on his chest and upper back.  Encouraged him to use radiaplex 2 times per day on these areas.  He has been given a one month follow up appointment.  BP 110/69 mmHg  Pulse 109  Temp(Src) 98.4 F (36.9 C) (Oral)  Resp 16  Ht 5\' 8"  (1.727 m)  Wt 121 lb 9.6 oz (55.157 kg)  BMI 18.49 kg/m2  SpO2 99%   Wt Readings from Last 3 Encounters:  02/13/15 121 lb 9.6 oz (55.157 kg)  02/13/15 121 lb 9.6 oz (55.157 kg)  02/06/15 123 lb 4.8 oz (55.929 kg)

## 2015-02-13 NOTE — Progress Notes (Signed)
  Radiation Oncology         [phone] ________________________________  Name: Andre Guzman MRN: [id number]  Date: 02/13/2015  DOB: January 17, 1946  Weekly Radiation Therapy Management  Squamous cell esophageal cancer    Current Dose: 35.0 Gy     Planned Dose:  35 Gy  Narrative . . . . . . . . The patient presents for routine under treatment assessment.                                    Jesselee Poth has completed treatment with 14 fractions to his esophagus. He denies pain today. He does report that food is getting stuck in his chest. He has to take small bites of softer foods and drink liquids afterwards to make sure they go down. He is taking hycet and Carafate 4 times a day. His bp was 86/58 in Dr. Ernestina Penna office today. He is going to get IV fluids on Thursday. He will start chemotherapy next week. He reports fatigue. He has hyperpigmentation on his chest and upper back. The nurse ,Elmo Putt, encouraged him to use Radiaplex 2 times per day on these areas. He has been given a one month follow up appointment. He has finished his treatment today.                                 Set-up films were reviewed.                                 The chart was checked. Physical Findings. . .  height is 5\' 8"  (1.727 m) and weight is 121 lb 9.6 oz (55.157 kg). His oral temperature is 98.4 F (36.9 C). His blood pressure is 110/69 and his pulse is 109. His respiration is 16 and oxygen saturation is 99%. . The lungs are clear. The heart has a regular rhythm and rate. Impression . . . . . . . The patient is tolerating radiation. Plan . . . . . . . . . . . . Continue treatment as planned. He will f/u with me in 1 month. He is to start chemotherapy under Dr. Burr Medico and his next w/u with her is on 02/19/15. I advised the patient to keep up his fluid intake.  This document serves as a record of services personally performed by Gery Pray, MD. It was created on his behalf by Darcus Austin, a trained medical  scribe. The creation of this record is based on the scribe's personal observations and the provider's statements to them. This document has been checked and approved by the attending provider.  ________________________________   Blair Promise, PhD, MD

## 2015-02-14 ENCOUNTER — Telehealth: Payer: Self-pay

## 2015-02-14 ENCOUNTER — Other Ambulatory Visit: Payer: Self-pay | Admitting: Hematology

## 2015-02-14 ENCOUNTER — Telehealth: Payer: Self-pay | Admitting: Oncology

## 2015-02-14 ENCOUNTER — Ambulatory Visit: Payer: Medicare Other

## 2015-02-14 ENCOUNTER — Other Ambulatory Visit: Payer: Self-pay | Admitting: Radiation Oncology

## 2015-02-14 DIAGNOSIS — C159 Malignant neoplasm of esophagus, unspecified: Secondary | ICD-10-CM

## 2015-02-14 MED ORDER — HYDROCODONE-ACETAMINOPHEN 7.5-325 MG/15ML PO SOLN: 10.0000 mL | Freq: Four times a day (QID) | ORAL | Status: DC | PRN

## 2015-02-14 MED ORDER — MORPHINE SULFATE 20 MG/5ML PO SOLN: 4.0000 mg | Freq: Four times a day (QID) | ORAL | Status: DC | PRN

## 2015-02-14 NOTE — Telephone Encounter (Signed)
Pt wife [name] requesting refill on Hycet.  Advised pt would forward request to desk nurse and she would call her when ready.  Pt voiced understanding.   Chart reviewed - pt filled 02/06/15 for 12 days taking max dose.  Pt is too early for refill.    Forwarded to MD and desk RN for further action.

## 2015-02-14 NOTE — Telephone Encounter (Signed)
[name] called requesting a refill on Nakia's hydrocodone/acetaminophen solution.  She said he would like it today.

## 2015-02-14 NOTE — Telephone Encounter (Signed)
I spoke with his wife, will change his pain meds to morphine liquid, with anticipating his can start chemotherapy next week. We'll give him the prescription in the infusional tomorrow.  Truitt Merle  02/14/2015

## 2015-02-15 ENCOUNTER — Ambulatory Visit (HOSPITAL_BASED_OUTPATIENT_CLINIC_OR_DEPARTMENT_OTHER): Payer: Medicare Other

## 2015-02-15 ENCOUNTER — Ambulatory Visit: Payer: Medicare Other

## 2015-02-15 VITALS — BP 112/63 | HR 96 | Temp 98.7°F | Resp 17

## 2015-02-15 DIAGNOSIS — C159 Malignant neoplasm of esophagus, unspecified: Secondary | ICD-10-CM

## 2015-02-15 DIAGNOSIS — E860 Dehydration: Secondary | ICD-10-CM | POA: Diagnosis not present

## 2015-02-15 DIAGNOSIS — R1310 Dysphagia, unspecified: Secondary | ICD-10-CM | POA: Diagnosis not present

## 2015-02-15 MED ORDER — SODIUM CHLORIDE 0.9 % IV SOLN: Freq: Once | INTRAVENOUS | Status: DC

## 2015-02-15 MED ORDER — LIDOCAINE-PRILOCAINE 2.5-2.5 % EX CREA
TOPICAL_CREAM | CUTANEOUS | Status: AC
  Filled 2015-02-15: qty 5

## 2015-02-15 MED ORDER — SODIUM CHLORIDE 0.9 % IV SOLN
Freq: Once | INTRAVENOUS | Status: AC
  Administered 2015-02-15: 13:00:00 via INTRAVENOUS

## 2015-02-15 NOTE — Addendum Note (Signed)
Addended by: Truitt Merle on: 02/15/2015 12:06 PM   Modules accepted: Orders

## 2015-02-15 NOTE — Patient Instructions (Signed)
Dehydration, Adult Dehydration is when you lose more fluids from the body than you take in. Vital organs like the kidneys, brain, and heart cannot function without a proper amount of fluids and salt. Any loss of fluids from the body can cause dehydration.  CAUSES   Vomiting.  Diarrhea.  Excessive sweating.  Excessive urine output.  Fever. SYMPTOMS  Mild dehydration  Thirst.  Dry lips.  Slightly dry mouth. Moderate dehydration  Very dry mouth.  Sunken eyes.  Skin does not bounce back quickly when lightly pinched and released.  Dark urine and decreased urine production.  Decreased tear production.  Headache. Severe dehydration  Very dry mouth.  Extreme thirst.  Rapid, weak pulse (more than 100 beats per minute at rest).  Cold hands and feet.  Not able to sweat in spite of heat and temperature.  Rapid breathing.  Blue lips.  Confusion and lethargy.  Difficulty being awakened.  Minimal urine production.  No tears. DIAGNOSIS  Your caregiver will diagnose dehydration based on your symptoms and your exam. Blood and urine tests will help confirm the diagnosis. The diagnostic evaluation should also identify the cause of dehydration. TREATMENT  Treatment of mild or moderate dehydration can often be done at home by increasing the amount of fluids that you drink. It is best to drink small amounts of fluid more often. Drinking too much at one time can make vomiting worse. Refer to the home care instructions below. Severe dehydration needs to be treated at the hospital where you will probably be given intravenous (IV) fluids that contain water and electrolytes. HOME CARE INSTRUCTIONS   Ask your caregiver about specific rehydration instructions.  Drink enough fluids to keep your urine clear or pale yellow.  Drink small amounts frequently if you have nausea and vomiting.  Eat as you normally do.  Avoid:  Foods or drinks high in sugar.  Carbonated  drinks.  Juice.  Extremely hot or cold fluids.  Drinks with caffeine.  Fatty, greasy foods.  Alcohol.  Tobacco.  Overeating.  Gelatin desserts.  Wash your hands well to avoid spreading bacteria and viruses.  Only take over-the-counter or prescription medicines for pain, discomfort, or fever as directed by your caregiver.  Ask your caregiver if you should continue all prescribed and over-the-counter medicines.  Keep all follow-up appointments with your caregiver. SEEK MEDICAL CARE IF:  You have abdominal pain and it increases or stays in one area (localizes).  You have a rash, stiff neck, or severe headache.  You are irritable, sleepy, or difficult to awaken.  You are weak, dizzy, or extremely thirsty. SEEK IMMEDIATE MEDICAL CARE IF:   You are unable to keep fluids down or you get worse despite treatment.  You have frequent episodes of vomiting or diarrhea.  You have blood or green matter (bile) in your vomit.  You have blood in your stool or your stool looks black and tarry.  You have not urinated in 6 to 8 hours, or you have only urinated a small amount of very dark urine.  You have a fever.  You faint. MAKE SURE YOU:   Understand these instructions.  Will watch your condition.  Will get help right away if you are not doing well or get worse. Document Released: 05/05/2005 Document Revised: 07/28/2011 Document Reviewed: 12/23/2010 Exit[employer] Patient Information 2015 [employer]. This information is not intended to replace advice given to you by your health care provider. Make sure you discuss any questions you have with your health care   provider.  

## 2015-02-16 ENCOUNTER — Telehealth: Payer: Self-pay | Admitting: Oncology

## 2015-02-16 ENCOUNTER — Ambulatory Visit: Payer: Medicare Other

## 2015-02-16 ENCOUNTER — Other Ambulatory Visit: Payer: Self-pay | Admitting: *Deleted

## 2015-02-16 MED ORDER — MORPHINE SULFATE (CONCENTRATE) 20 MG/ML PO SOLN: 5.0000 mg | Freq: Four times a day (QID) | ORAL | Status: DC | PRN

## 2015-02-16 MED ORDER — LIDOCAINE-PRILOCAINE 2.5-2.5 % EX CREA: 1.0000 "application " | TOPICAL_CREAM | CUTANEOUS | Status: DC | PRN

## 2015-02-16 NOTE — Telephone Encounter (Signed)
Called and let Andre Guzman know that the prescription for Hycet is ready for pick up.  Andre Guzman said Dr. Burr Medico gave Andre Guzman a prescription for Morphine today so he does not need the hycet prescription.  Verbalized agreement and advised her to call with any questions or concerns.

## 2015-02-19 ENCOUNTER — Other Ambulatory Visit (HOSPITAL_BASED_OUTPATIENT_CLINIC_OR_DEPARTMENT_OTHER): Payer: Medicare Other

## 2015-02-19 ENCOUNTER — Encounter: Payer: Self-pay | Admitting: Hematology

## 2015-02-19 ENCOUNTER — Ambulatory Visit (HOSPITAL_BASED_OUTPATIENT_CLINIC_OR_DEPARTMENT_OTHER): Payer: Medicare Other | Admitting: Hematology

## 2015-02-19 ENCOUNTER — Ambulatory Visit: Payer: Medicare Other

## 2015-02-19 VITALS — BP 131/70 | HR 111 | Temp 98.9°F | Resp 18 | Ht 68.0 in | Wt 120.9 lb

## 2015-02-19 DIAGNOSIS — C159 Malignant neoplasm of esophagus, unspecified: Secondary | ICD-10-CM

## 2015-02-19 DIAGNOSIS — I959 Hypotension, unspecified: Secondary | ICD-10-CM | POA: Diagnosis not present

## 2015-02-19 DIAGNOSIS — Z23 Encounter for immunization: Secondary | ICD-10-CM

## 2015-02-19 DIAGNOSIS — R1310 Dysphagia, unspecified: Secondary | ICD-10-CM

## 2015-02-19 DIAGNOSIS — C155 Malignant neoplasm of lower third of esophagus: Secondary | ICD-10-CM

## 2015-02-19 DIAGNOSIS — C7951 Secondary malignant neoplasm of bone: Secondary | ICD-10-CM | POA: Diagnosis not present

## 2015-02-19 LAB — COMPREHENSIVE METABOLIC PANEL (CC13)
ALT: 24 U/L (ref 0–55)
AST: 26 U/L (ref 5–34)
Albumin: 2.7 g/dL — ABNORMAL LOW (ref 3.5–5.0)
Alkaline Phosphatase: 197 U/L — ABNORMAL HIGH (ref 40–150)
Anion Gap: 9 mEq/L (ref 3–11)
BUN: 14.8 mg/dL (ref 7.0–26.0)
CALCIUM: 9.1 mg/dL (ref 8.4–10.4)
CHLORIDE: 101 meq/L (ref 98–109)
CO2: 29 meq/L (ref 22–29)
CREATININE: 0.8 mg/dL (ref 0.7–1.3)
EGFR: >90 mL/min/{1.73_m2} (ref >90)
GLUCOSE: 152 mg/dL — AB (ref 70–140)
POTASSIUM: 4.1 meq/L (ref 3.5–5.1)
SODIUM: 139 meq/L (ref 136–145)
Total Bilirubin: 0.47 mg/dL (ref 0.20–1.20)
Total Protein: 8 g/dL (ref 6.4–8.3)

## 2015-02-19 LAB — CBC & DIFF AND RETIC
BASO%: 0.1 % (ref 0.0–2.0)
BASOS ABS: 0 10*3/uL (ref 0.0–0.1)
EOS%: 0.4 % (ref 0.0–7.0)
Eosinophils Absolute: 0 10*3/uL (ref 0.0–0.5)
HEMATOCRIT: 35 % — AB (ref 38.4–49.9)
HGB: 12 g/dL — ABNORMAL LOW (ref 13.0–17.1)
Immature Retic Fract: 3 % (ref 3.00–10.60)
LYMPH#: 0.5 10*3/uL — AB (ref 0.9–3.3)
LYMPH%: 6.2 % — ABNORMAL LOW (ref 14.0–49.0)
MCH: 29.8 pg (ref 27.2–33.4)
MCHC: 34.3 g/dL (ref 32.0–36.0)
MCV: 86.8 fL (ref 79.3–98.0)
MONO#: 0.4 10*3/uL (ref 0.1–0.9)
MONO%: 4.7 % (ref 0.0–14.0)
NEUT%: 88.6 % — ABNORMAL HIGH (ref 39.0–75.0)
NEUTROS ABS: 6.9 10*3/uL — AB (ref 1.5–6.5)
Platelets: 242 10*3/uL (ref 140–400)
RBC: 4.03 10*6/uL — AB (ref 4.20–5.82)
RDW: 13.6 % (ref 11.0–14.6)
RETIC %: 0.92 % (ref 0.80–1.80)
RETIC CT ABS: 37.08 10*3/uL (ref 34.80–93.90)
WBC: 7.8 10*3/uL (ref 4.0–10.3)

## 2015-02-19 MED ORDER — INFLUENZA VAC SPLIT QUAD 0.5 ML IM SUSY
0.5000 mL | PREFILLED_SYRINGE | Freq: Once | INTRAMUSCULAR | Status: AC
  Administered 2015-02-19: 0.5 mL via INTRAMUSCULAR
  Filled 2015-02-19: qty 0.5

## 2015-02-19 MED ORDER — LIDOCAINE-PRILOCAINE 2.5-2.5 % EX CREA: TOPICAL_CREAM | CUTANEOUS | Status: DC

## 2015-02-19 MED ORDER — ONDANSETRON HCL 8 MG PO TABS: 8.0000 mg | ORAL_TABLET | Freq: Two times a day (BID) | ORAL | Status: DC

## 2015-02-19 NOTE — Progress Notes (Signed)
  Radiation Oncology         [phone] ________________________________  Name: Andre Guzman MRN: [id number]  Date: 02/13/2015  DOB: 12-24-1945  End of Treatment Note  ICD-9-CM ICD-10-CM    1. Squamous cell esophageal cancer 150.9 C15.9     DIAGNOSIS: Stage IV esophageal cancer     Indication for treatment:  Esophageal narrowing and dysphagia      Radiation treatment dates:   01/25/2015-02/13/2015  Site/dose:   35 gray in 14 fractions  Beams/energy:   3-D conformal  Narrative: The patient tolerated radiation treatment relatively well.  He experienced some fatigue with this course of treatment. He did have some esophageal symptoms but was able to take in soft foods and liquids during the course of his treatment  Plan: The patient has completed radiation treatment. The patient will return to radiation oncology clinic for routine followup in one month. I advised them to call or return sooner if they have any questions or concerns related to their recovery or treatment.  -----------------------------------  Blair Promise, PhD, MD

## 2015-02-19 NOTE — Progress Notes (Signed)
[place]  [phone] [phone]) [phone]  Clinic Follow up Note   Patient Care Team: Glendale Chard, MD as PCP - General (Internal Medicine) Adrian Prows, MD as Consulting Physician (Cardiology) Jovita Gamma, MD as Consulting Physician (Neurosurgery) Lowella Bandy, MD as Consulting Physician (Urology) Grace Isaac, MD as Consulting Physician (Cardiothoracic Surgery) Truitt Merle, MD as Consulting Physician (Hematology) Tania Ade, RN as Registered Nurse Carol Ada, MD as Consulting Physician (Gastroenterology) Gery Pray, MD as Consulting Physician (Radiation Oncology) 02/19/2015  CHIEF COMPLAINTS:  Follow up esophageal squamous cell carcinoma  Oncology History   Squamous cell esophageal cancer   Staging form: Esophagus - Squamous Cell Carcinoma, AJCC 7th Edition     Clinical: Stage IV ([place], N0, M1) - Unsigned       Squamous cell esophageal cancer ([place])   01/02/2015 Initial Diagnosis Squamous cell esophageal cancer   01/02/2015 Procedure EGD by Dr. Benson Norway showed a large, fungating mass with no bleeding and with stigmata met of recent bleeding was found in the third of the esophagus. The mass was completely obstructing and circumferential. The stomach and examined duodenum were normal.   01/02/2015 Imaging CT CAP showed 9 cm segment of irregular his social worker thickening. Single enlarged subcarinal lymph node, bilateral pulmonary nodules (3), 65m right middle lobe next to right atrium, 3 RUL and a 7 mm lingular lung nodules.    01/02/2015 Initial Biopsy Esophageal mass biopsy showed poorly differentiated squamous cell carcinoma.   01/18/2015 Imaging PET scan showed hypermetabolic esophageal mass, subcarinal and possible right hilar adenopathy, several lung nodules, and a hypermetabolic bone lesion in right ilium, compatible with metastatic disease.    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 male is here because of recently diagnosed esophageal squamous cell carcinoma.  He has had dysphagia for one month, with solid food mainly, he is able to keep liquid and soft diet down. He lost about 30 lbs in the the past month, but his weight has been stable in the past week since he started taking boost. He had some chest pain when he swallows. He is constipated, on stool softener, no bleeding.  He otherwise feels fine. His energy level is fair, he functions very well, no limitation on his physical activities. No nausea, no other pain, cough or other, he drinks about 2 boost a day, soup,and some soft diet.  He was referred to gastroenterologist Dr. HBenson Norwayby his primary care physician. He underwent EGD which showed a circumferential, obstructing, fungating mass in the third esophagus, biopsy showed poorly differentiated squamous cell carcinoma. CT scan chest abdomen and pelvis showed 3 lung nodules, with the largest 1.2 cm in the right middle lobe next to the right atrium.  CURRENT THERAPY: pending chemotherapy with FOLFOX, starting on 02/22/15   INTERIM HISTORY GJansonreturns for follow-up, he feels about the same. His dysphagia has not improved since he started radiation, actually probably slightly worse overall. He is able to tolerate soft diet only. He drinks boost 2 bottles a day. Weight has been stable in the past few weeks. His chest pain is controlled with liquid morphine I gave him last week, no significant nausea, cough. His energy level is moderate, tolerate protein activity well.   MEDICAL HISTORY:  Past Medical History  Diagnosis Date  .  Coronary artery disease   . Shortness of breath   . Seizures   . Hypertension   . Pneumonia   . COPD (chronic obstructive pulmonary disease)   . Stroke   . Esophageal cancer      SURGICAL HISTORY: Past Surgical History  Procedure Laterality Date  . Cardiac catheterization    . Coronary angioplasty with stent placement  07/22/2011  . Cardiac valve surgery    . Coronary artery bypass graft    . Lower extremity angiogram N/A 07/01/2011    Procedure: LOWER EXTREMITY ANGIOGRAM;  Surgeon: Laverda Page, MD;  Location: [facility] CATH LAB;  Service: Cardiovascular;  Laterality: N/A;  . Left heart catheterization with coronary angiogram N/A 07/01/2011    Procedure: LEFT HEART CATHETERIZATION WITH CORONARY ANGIOGRAM;  Surgeon: Laverda Page, MD;  Location: [facility] CATH LAB;  Service: Cardiovascular;  Laterality: N/A;  . Percutaneous coronary rotoblator intervention (pci-r) N/A 07/22/2011    Procedure: PERCUTANEOUS CORONARY ROTOBLATOR INTERVENTION (PCI-R);  Surgeon: Laverda Page, MD;  Location: [facility] CATH LAB;  Service: Cardiovascular;  Laterality: N/A;  . Left heart catheterization with coronary angiogram N/A 10/30/2011    Procedure: LEFT HEART CATHETERIZATION WITH CORONARY ANGIOGRAM;  Surgeon: Laverda Page, MD;  Location: [facility] CATH LAB;  Service: Cardiovascular;  Laterality: N/A;  . Esophagogastroduodenoscopy  01/02/15    SOCIAL HISTORY: Social History   Social History  . Marital Status: Married, lives with his wife     Spouse Name: N/A  . Number of Children: 53, 1 daughter and 3 sons, all lives in [place]   . Years of Education: N/A   Occupational History  . Retired Nature conservation officer    Social History Main Topics  . Smoking status: Current Every Day Smoker -- 0.50 packs/day for 50 years    Types: Cigarettes  . Smokeless tobacco: Never Used  . 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 Drug Use: No  . Sexual Activity: Not on file     Comment: did not ask   Other Topics Concern  . Not on file   Social History Narrative    FAMILY HISTORY: Family History  Problem Relation Age of Onset  . Heart disease  Mother   . Hypertension Mother   . Heart disease Sister   . Hypertension Sister   . Heart disease Brother   . Hypertension Daughter     ALLERGIES:  has No Known Allergies.  MEDICATIONS:  Current Outpatient Prescriptions  Medication Sig Dispense Refill  . albuterol (PROVENTIL HFA;VENTOLIN HFA) 108 (90 BASE) MCG/ACT inhaler Inhale 1-2 puffs into the lungs every 6 (six) hours as needed for wheezing or shortness of breath.    Marland Kitchen aspirin EC 81 MG tablet Take 81 mg by mouth daily.    . isosorbide mononitrate (IMDUR) 120 MG 24 hr tablet Take 1 tablet (120 mg total) by mouth daily. 30 tablet 2  . lidocaine-prilocaine (EMLA) cream Apply 1 application topically as needed. 30 g 2  . morphine (ROXANOL) 20 MG/ML concentrated solution Take 0.25 mLs (5 mg total) by mouth every 6 (six) hours as needed for severe pain. 30 mL 0  . phenytoin (DILANTIN) 100 MG ER capsule Take 100 mg by mouth 2 (two) times daily.    . Polyvinyl Alcohol-Povidone (REFRESH OP) Place 1 drop into both eyes daily as needed (dry eyes).    . pravastatin (PRAVACHOL) 40 MG tablet Take 40 mg by mouth  every evening.     Marland Kitchen PRESCRIPTION MEDICATION Chemo [facility]    . sucralfate (CARAFATE) 1 G tablet Take 1 tablet (1 g total) by mouth 4 (four) times daily -  with meals and at bedtime. 50 tablet 1  . terazosin (HYTRIN) 10 MG capsule Take 10 mg by mouth daily.  0  . triamterene-hydrochlorothiazide (MAXZIDE-25) 37.5-25 MG per tablet Take 1 tablet by mouth daily. 30 tablet 2  . hyaluronate sodium (RADIAPLEXRX) GEL Apply 1 application topically 2 (two) times daily.     No current facility-administered medications for this visit.   Facility-Administered Medications Ordered in Other Visits  Medication Dose Route Frequency Provider Last Rate Last Dose  . 0.9 %  sodium chloride infusion   Intravenous Once Truitt Merle, MD        REVIEW OF SYSTEMS:   Constitutional: Denies fevers, chills or abnormal night sweats, (+) weight loss  Eyes: Denies  blurriness of vision, double vision or watery eyes Ears, nose, mouth, throat, and face: Denies mucositis or sore throat Respiratory: Denies cough, dyspnea or wheezes Cardiovascular: Denies palpitation, chest discomfort or lower extremity swelling Gastrointestinal:  (+) Dysphagia, mild heartburn, Denies nausea, vomiting or change in bowel habits Skin: Denies abnormal skin rashes Lymphatics: Denies new lymphadenopathy or easy bruising Neurological:Denies numbness, tingling or new weaknesses Behavioral/Psych: Mood is stable, no new changes  All other systems were reviewed with the patient and are negative.  PHYSICAL EXAMINATION: ECOG PERFORMANCE STATUS: 1 - Symptomatic but completely ambulatory  Filed Vitals:   02/19/15 1307  BP: 131/70  Pulse: 111  Temp: 98.9 F (37.2 C)  Resp: 18   Filed Weights   02/19/15 1307  Weight: 120 lb 14.4 oz (54.84 kg)    GENERAL:alert, no distress and comfortable SKIN: skin color, texture, turgor are normal, no rashes or significant lesions EYES: normal, conjunctiva are pink and non-injected, sclera clear OROPHARYNX:no exudate, no erythema and lips, buccal mucosa, and tongue normal  NECK: supple, thyroid normal size, non-tender, without nodularity LYMPH:  no palpable lymphadenopathy in the cervical, axillary or inguinal LUNGS: clear to auscultation and percussion with normal breathing effort HEART: regular rate & rhythm and no murmurs and no lower extremity edema ABDOMEN:abdomen soft, non-tender and normal bowel sounds Musculoskeletal:no cyanosis of digits and no clubbing  PSYCH: alert & oriented x 3 with fluent speech NEURO: no focal motor/sensory deficits  LABORATORY DATA:  I have reviewed the data as listed CBC Latest Ref Rng 02/19/2015 02/13/2015 01/26/2015  WBC 4.0 - 10.3 10e3/uL 7.8 8.2 8.7  Hemoglobin 13.0 - 17.1 g/dL 12.0(L) 11.8(L) 11.7(L)  Hematocrit 38.4 - 49.9 % 35.0(L) 34.6(L) 34.5(L)  Platelets 140 - 400 10e3/uL 242 261 295    CMP  Latest Ref Rng 02/19/2015 02/13/2015 01/23/2015  Glucose 70 - 140 mg/dl 152(H) 145(H) 121(H)  BUN 7.0 - 26.0 mg/dL 14.8 18.6 26(H)  Creatinine 0.7 - 1.3 mg/dL 0.8 0.9 0.91  Sodium 136 - 145 mEq/L 139 139 137  Potassium 3.5 - 5.1 mEq/L 4.1 3.8 5.1  Chloride 101 - 111 mmol/L - - 96(L)  CO2 22 - 29 mEq/L 29 30(H) 31  Calcium 8.4 - 10.4 mg/dL 9.1 9.2 9.4  Total Protein 6.4 - 8.3 g/dL 8.0 8.0 -  Total Bilirubin 0.20 - 1.20 mg/dL 0.47 0.33 -  Alkaline Phos 40 - 150 U/L 197(H) 178(H) -  AST 5 - 34 U/L 26 30 -  ALT 0 - 55 U/L 24 35 -   Pathology report  Diagnosis 01/26/2015 Bone, biopsy,  r iliac - 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 Microscopic Comment The metastatic carcinoma is positive by immunohistochemistry with p63, cytokeratin 903 and cytokeratin 5/6. The tumor is negative with CD56, synaptophysin, chromogranin, thyroid transcription factor-1 and S100. (JDP:gt, 01/30/15)   RADIOGRAPHIC STUDIES: I have personally reviewed the radiological images as listed and agreed with the findings in the report.  PET 01/18/2015 IMPRESSION: 1. Previously described esophageal mass is diffusely hypermetabolic. Today's study demonstrates associated hypermetabolic subcarinal and possible right hilar lymphadenopathy, several pulmonary nodules in the lungs bilaterally (largest of which are hypermetabolic), and a hypermetabolic lesion in the superior aspect of the right ilium, compatible with metastatic disease. 2. Atherosclerosis, including left main and 3 vessel coronary artery disease. 3. Additional incidental findings, as above.   ASSESSMENT & PLAN: 69 year old African-American male, with past medical history of hypertension, history of stroke, coronary artery disease, who presents with progressive dysphasia.  1. 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 -I discussed his right iliac bone biopsy results with patient and his son, unfortunately biopsy confirmed metastatic squamous cell carcinoma. -We reviewed the natural course of esophagitis cancer, and the incurable nature of his disease. We emphasized the goal of therapy is palliation and prolonged his life. -He has completed a short course of palliative radiation  -I'll plan to start chethis week with first-line FOLFOX. --Chemotherapy consent: Side effects including but does not not limited to, fatigue, nausea, vomiting, diarrhea, hair loss, cold sensitivity and neuropathy, fluid retention, renal and kidney dysfunction, neutropenic fever, needed for blood transfusion, bleeding, heart attack, skin rash, were discussed with patient in great detail. He agrees to proceed. -We discussed the role of feeding tube. If he has worsening dysphasia, he may need feeding tube, likely PEG  -he will follow up dietician Pamala Hurry    2. Dysphagia and weight loss -secondary to #1 -follow up with dietician, I encourage him to drink more nutrition supplement  -His weight has been stable in the past few weeks   3. HTN, CAD, history of seizure and CVA -Follow up with primary care physician -we discussed that we will watch his BP closely, and may need adjust his meds during chemo   4. Chest pain, secondary to his esophageal cancer and radiation -Continue morphine as needed   5. Hypotension -Resolved today, he received IV fluids last week, and I took him off Maxzide  -Continue watch his blood pressure closely.   Plan -start FOLFOX on 10/6, no 5-fu bolus or neulasta  -I will see him back on August 20 before the second cycle chemotherapy   All questions were answered. The patient knows to call the clinic with any problems, questions or concerns.  I spent 20 minutes counseling the patient face to face. The total time spent in the appointment was 25 minutes and more than 50%  was on counseling.     Truitt Merle, MD 02/19/2015 1:26 PM

## 2015-02-20 ENCOUNTER — Ambulatory Visit: Payer: Medicare Other

## 2015-02-21 ENCOUNTER — Ambulatory Visit: Payer: Medicare Other

## 2015-02-22 ENCOUNTER — Other Ambulatory Visit: Payer: Medicare Other

## 2015-02-22 ENCOUNTER — Ambulatory Visit: Payer: Medicare Other | Admitting: Nutrition

## 2015-02-22 ENCOUNTER — Ambulatory Visit: Payer: Medicare Other

## 2015-02-22 ENCOUNTER — Ambulatory Visit (HOSPITAL_BASED_OUTPATIENT_CLINIC_OR_DEPARTMENT_OTHER): Payer: Medicare Other

## 2015-02-22 VITALS — BP 104/61 | HR 103 | Temp 99.2°F

## 2015-02-22 DIAGNOSIS — C7951 Secondary malignant neoplasm of bone: Secondary | ICD-10-CM

## 2015-02-22 DIAGNOSIS — Z5111 Encounter for antineoplastic chemotherapy: Secondary | ICD-10-CM

## 2015-02-22 DIAGNOSIS — C159 Malignant neoplasm of esophagus, unspecified: Secondary | ICD-10-CM

## 2015-02-22 DIAGNOSIS — C155 Malignant neoplasm of lower third of esophagus: Secondary | ICD-10-CM

## 2015-02-22 MED ORDER — OXALIPLATIN CHEMO INJECTION 100 MG/20ML
85.0000 mg/m2 | Freq: Once | INTRAVENOUS | Status: AC
  Administered 2015-02-22: 145 mg via INTRAVENOUS
  Filled 2015-02-22: qty 29

## 2015-02-22 MED ORDER — SODIUM CHLORIDE 0.9 % IV SOLN
2400.0000 mg/m2 | INTRAVENOUS | Status: DC
  Administered 2015-02-22: 4050 mg via INTRAVENOUS
  Filled 2015-02-22: qty 81

## 2015-02-22 MED ORDER — SODIUM CHLORIDE 0.9 % IV SOLN
Freq: Once | INTRAVENOUS | Status: AC
  Administered 2015-02-22: 12:00:00 via INTRAVENOUS
  Filled 2015-02-22: qty 4

## 2015-02-22 MED ORDER — DEXTROSE 5 % IV SOLN
Freq: Once | INTRAVENOUS | Status: AC
  Administered 2015-02-22: 12:00:00 via INTRAVENOUS

## 2015-02-22 MED ORDER — DEXTROSE 5 % IV SOLN
400.0000 mg/m2 | Freq: Once | INTRAVENOUS | Status: AC
  Administered 2015-02-22: 676 mg via INTRAVENOUS
  Filled 2015-02-22: qty 33.8

## 2015-02-22 NOTE — Progress Notes (Signed)
Follow-up completed with patient in infusion being treated for esophageal cancer.  Weight documented as 120.9 pounds today decreased from 123.3 pounds September 20. Patient enjoys boost plus and reports drinking 3 daily. Patient is receiving oxaliplatin as part of his chemotherapy regimen.  Nutrition diagnosis: Unintended weight loss continues.  Intervention:  I encouraged patient to increase oral nutrition supplements to 4 times a day Provided additional coupons. Encouraged soft moist foods as tolerated. Education provided on consuming warm supplements and warm foods after treatment with oxaliplatin. Recipes were provided. Teach back method was used.  Monitoring, evaluation, goals: Patient will work to increase calories and protein to minimize further weight loss.  Next visit: Thursday, October 20, during infusion.  **Disclaimer: This note was dictated with voice recognition software. Similar sounding words can inadvertently be transcribed and this note may contain transcription errors which may not have been corrected upon publication of note.**

## 2015-02-22 NOTE — Patient Instructions (Signed)
[place] Discharge Instructions for Patients Receiving Chemotherapy  Today you received the following chemotherapy agents: Oxaliplatin, leucovorin and Adrucil  To help prevent nausea and vomiting after your treatment, we encourage you to take your nausea medication: Zofran 8 mg every 12 hours for 2 days starting the day after chemotherapy   If you develop nausea and vomiting that is not controlled by your nausea medication, call the clinic.   BELOW ARE SYMPTOMS THAT SHOULD BE REPORTED IMMEDIATELY:  *FEVER GREATER THAN 100.5 F  *CHILLS WITH OR WITHOUT FEVER  NAUSEA AND VOMITING THAT IS NOT CONTROLLED WITH YOUR NAUSEA MEDICATION  *UNUSUAL SHORTNESS OF BREATH  *UNUSUAL BRUISING OR BLEEDING  TENDERNESS IN MOUTH AND THROAT WITH OR WITHOUT PRESENCE OF ULCERS  *URINARY PROBLEMS  *BOWEL PROBLEMS  UNUSUAL RASH Items with * indicate a potential emergency and should be followed up as soon as possible.  Feel free to call the clinic you have any questions or concerns. The clinic phone number is [phone].  Please show the [place] at check-in to the Emergency Department and triage nurse.

## 2015-02-23 ENCOUNTER — Telehealth: Payer: Self-pay | Admitting: *Deleted

## 2015-02-23 ENCOUNTER — Ambulatory Visit: Payer: Medicare Other

## 2015-02-23 NOTE — Telephone Encounter (Signed)
Called & spoke to pt's wife & she states pt has done fine without any problems of nausea/vomiting, diarrhea or constipation & is tol the pump OK.  The will be here tomorrow to remove pump.  Informed to call us if any problems or concerns.

## 2015-02-23 NOTE — Telephone Encounter (Signed)
-----   Message from Otila Kluver, RN sent at 02/22/2015  4:49 PM EDT ----- Regarding: Burr Medico 1st time chemo Contact: [phone] 1st time Folfox. Tolerated well during infusion. No evidence of reaction.  Drucie Ip, RN

## 2015-02-24 ENCOUNTER — Ambulatory Visit (HOSPITAL_BASED_OUTPATIENT_CLINIC_OR_DEPARTMENT_OTHER): Payer: Medicare Other

## 2015-02-24 VITALS — BP 121/63 | HR 103 | Temp 98.7°F | Resp 18

## 2015-02-24 DIAGNOSIS — C155 Malignant neoplasm of lower third of esophagus: Secondary | ICD-10-CM

## 2015-02-24 DIAGNOSIS — C159 Malignant neoplasm of esophagus, unspecified: Secondary | ICD-10-CM

## 2015-02-24 MED ORDER — SODIUM CHLORIDE 0.9 % IJ SOLN
10.0000 mL | INTRAMUSCULAR | Status: DC | PRN
  Administered 2015-02-24: 10 mL
  Filled 2015-02-24: qty 10

## 2015-02-24 MED ORDER — HEPARIN SOD (PORK) LOCK FLUSH 100 UNIT/ML IV SOLN
500.0000 [IU] | Freq: Once | INTRAVENOUS | Status: AC | PRN
  Administered 2015-02-24: 500 [IU]
  Filled 2015-02-24: qty 5

## 2015-02-26 ENCOUNTER — Ambulatory Visit: Payer: Medicare Other

## 2015-02-27 ENCOUNTER — Ambulatory Visit: Payer: Medicare Other

## 2015-02-28 ENCOUNTER — Ambulatory Visit: Payer: Medicare Other

## 2015-03-01 ENCOUNTER — Ambulatory Visit: Payer: Medicare Other

## 2015-03-02 ENCOUNTER — Ambulatory Visit: Payer: Medicare Other

## 2015-03-05 ENCOUNTER — Ambulatory Visit: Payer: Medicare Other

## 2015-03-08 ENCOUNTER — Ambulatory Visit: Payer: Medicare Other | Admitting: Nutrition

## 2015-03-08 ENCOUNTER — Ambulatory Visit (HOSPITAL_BASED_OUTPATIENT_CLINIC_OR_DEPARTMENT_OTHER): Payer: Medicare Other | Admitting: Hematology

## 2015-03-08 ENCOUNTER — Ambulatory Visit: Payer: Medicare Other

## 2015-03-08 ENCOUNTER — Telehealth: Payer: Self-pay | Admitting: Hematology

## 2015-03-08 ENCOUNTER — Encounter: Payer: Self-pay | Admitting: Hematology

## 2015-03-08 ENCOUNTER — Ambulatory Visit (HOSPITAL_BASED_OUTPATIENT_CLINIC_OR_DEPARTMENT_OTHER): Payer: Medicare Other

## 2015-03-08 ENCOUNTER — Encounter: Payer: Self-pay | Admitting: *Deleted

## 2015-03-08 ENCOUNTER — Other Ambulatory Visit (HOSPITAL_BASED_OUTPATIENT_CLINIC_OR_DEPARTMENT_OTHER): Payer: Medicare Other

## 2015-03-08 ENCOUNTER — Other Ambulatory Visit: Payer: Medicare Other

## 2015-03-08 VITALS — BP 134/70 | HR 90 | Temp 98.6°F | Resp 18 | Ht 68.0 in | Wt 127.4 lb

## 2015-03-08 DIAGNOSIS — C159 Malignant neoplasm of esophagus, unspecified: Secondary | ICD-10-CM

## 2015-03-08 DIAGNOSIS — C155 Malignant neoplasm of lower third of esophagus: Secondary | ICD-10-CM | POA: Diagnosis not present

## 2015-03-08 DIAGNOSIS — R1310 Dysphagia, unspecified: Secondary | ICD-10-CM | POA: Diagnosis not present

## 2015-03-08 DIAGNOSIS — Z5111 Encounter for antineoplastic chemotherapy: Secondary | ICD-10-CM

## 2015-03-08 DIAGNOSIS — R634 Abnormal weight loss: Secondary | ICD-10-CM

## 2015-03-08 DIAGNOSIS — C7951 Secondary malignant neoplasm of bone: Secondary | ICD-10-CM | POA: Diagnosis not present

## 2015-03-08 LAB — CBC & DIFF AND RETIC
BASO%: 0.4 % (ref 0.0–2.0)
Basophils Absolute: 0 10*3/uL (ref 0.0–0.1)
EOS%: 0.8 % (ref 0.0–7.0)
Eosinophils Absolute: 0 10*3/uL (ref 0.0–0.5)
HCT: 30.4 % — ABNORMAL LOW (ref 38.4–49.9)
HGB: 10.3 g/dL — ABNORMAL LOW (ref 13.0–17.1)
Immature Retic Fract: 9 % (ref 3.00–10.60)
LYMPH#: 0.4 10*3/uL — AB (ref 0.9–3.3)
LYMPH%: 14.5 % (ref 14.0–49.0)
MCH: 29.1 pg (ref 27.2–33.4)
MCHC: 33.9 g/dL (ref 32.0–36.0)
MCV: 85.9 fL (ref 79.3–98.0)
MONO#: 0.7 10*3/uL (ref 0.1–0.9)
MONO%: 28.2 % — ABNORMAL HIGH (ref 0.0–14.0)
NEUT%: 56.1 % (ref 39.0–75.0)
NEUTROS ABS: 1.4 10*3/uL — AB (ref 1.5–6.5)
PLATELETS: 226 10*3/uL (ref 140–400)
RBC: 3.54 10*6/uL — AB (ref 4.20–5.82)
RDW: 13.5 % (ref 11.0–14.6)
RETIC %: 1.38 % (ref 0.80–1.80)
RETIC CT ABS: 48.85 10*3/uL (ref 34.80–93.90)
WBC: 2.6 10*3/uL — AB (ref 4.0–10.3)

## 2015-03-08 LAB — COMPREHENSIVE METABOLIC PANEL (CC13)
ALT: 38 U/L (ref 0–55)
ANION GAP: 7 meq/L (ref 3–11)
AST: 38 U/L — ABNORMAL HIGH (ref 5–34)
Albumin: 2.2 g/dL — ABNORMAL LOW (ref 3.5–5.0)
Alkaline Phosphatase: 178 U/L — ABNORMAL HIGH (ref 40–150)
BUN: 7.1 mg/dL (ref 7.0–26.0)
CHLORIDE: 101 meq/L (ref 98–109)
CO2: 29 meq/L (ref 22–29)
CREATININE: 0.7 mg/dL (ref 0.7–1.3)
Calcium: 8.8 mg/dL (ref 8.4–10.4)
EGFR: >90 mL/min/{1.73_m2} (ref >90)
GLUCOSE: 138 mg/dL (ref 70–140)
Potassium: 3.9 mEq/L (ref 3.5–5.1)
SODIUM: 138 meq/L (ref 136–145)
Total Bilirubin: 0.49 mg/dL (ref 0.20–1.20)
Total Protein: 7.2 g/dL (ref 6.4–8.3)

## 2015-03-08 MED ORDER — DEXTROSE 5 % IV SOLN
Freq: Once | INTRAVENOUS | Status: AC
  Administered 2015-03-08: 11:00:00 via INTRAVENOUS

## 2015-03-08 MED ORDER — HEPARIN SOD (PORK) LOCK FLUSH 100 UNIT/ML IV SOLN
500.0000 [IU] | Freq: Once | INTRAVENOUS | Status: DC | PRN
  Filled 2015-03-08: qty 5

## 2015-03-08 MED ORDER — LEUCOVORIN CALCIUM INJECTION 350 MG
400.0000 mg/m2 | Freq: Once | INTRAVENOUS | Status: AC
  Administered 2015-03-08: 676 mg via INTRAVENOUS
  Filled 2015-03-08: qty 33.8

## 2015-03-08 MED ORDER — DEXTROSE 5 % IV SOLN
85.0000 mg/m2 | Freq: Once | INTRAVENOUS | Status: AC
  Administered 2015-03-08: 145 mg via INTRAVENOUS
  Filled 2015-03-08: qty 29

## 2015-03-08 MED ORDER — SODIUM CHLORIDE 0.9 % IJ SOLN
10.0000 mL | INTRAMUSCULAR | Status: DC | PRN
  Filled 2015-03-08: qty 10

## 2015-03-08 MED ORDER — SODIUM CHLORIDE 0.9 % IV SOLN
Freq: Once | INTRAVENOUS | Status: AC
  Administered 2015-03-08: 12:00:00 via INTRAVENOUS
  Filled 2015-03-08: qty 4

## 2015-03-08 MED ORDER — MORPHINE SULFATE (CONCENTRATE) 20 MG/ML PO SOLN: 5.0000 mg | Freq: Four times a day (QID) | ORAL | Status: DC | PRN

## 2015-03-08 MED ORDER — SODIUM CHLORIDE 0.9 % IV SOLN
2400.0000 mg/m2 | INTRAVENOUS | Status: DC
  Administered 2015-03-08: 4050 mg via INTRAVENOUS
  Filled 2015-03-08: qty 81

## 2015-03-08 NOTE — Progress Notes (Signed)
Oncology Nurse Navigator Documentation  Oncology Nurse Navigator Flowsheets 03/08/2015  Referral date to RadOnc/MedOnc -  Navigator Encounter Type Treatment  Patient Visit Type Medonc  Treatment Phase Treatment #2 FOLFOX  Barriers/Navigation Needs No barriers at this time  Interventions None required  Referrals -  Support Groups/Services -  Time Spent with Patient 5  Reports eating well and swallowing well;no transportation difficulty

## 2015-03-08 NOTE — Progress Notes (Signed)
Nutrition follow-up completed with patient during infusion for esophageal cancer. Weight has improved and was documented as 127.4 pounds October 20 increased from 120.9 pounds October 6. Patient reports he is eating better. Consumes 2-3 oral nutrition supplements daily. Reports the recipes I provided last follow-up were helpful.  Nutrition diagnosis: Unintended weight loss improved.  Intervention:  Encouraged patient to continue strategies for increased oral intake to promote weight gain. Patient declined samples or coupons today. Teach back method was used.  Monitoring, evaluation, goals: Patient will continue increased calories and protein to promote weight gain.  Next visit: To be scheduled as needed.  Patient has my contact information.  **Disclaimer: This note was dictated with voice recognition software. Similar sounding words can inadvertently be transcribed and this note may contain transcription errors which may not have been corrected upon publication of note.**

## 2015-03-08 NOTE — Patient Instructions (Signed)
[place] Discharge Instructions for Patients Receiving Chemotherapy  Today you received the following chemotherapy agents; Leucovroin, Oxaliplatin and Fluorouracil.   To help prevent nausea and vomiting after your treatment, we encourage you to take your nausea medication as directed.    If you develop nausea and vomiting that is not controlled by your nausea medication, call the clinic.   BELOW ARE SYMPTOMS THAT SHOULD BE REPORTED IMMEDIATELY:  *FEVER GREATER THAN 100.5 F  *CHILLS WITH OR WITHOUT FEVER  NAUSEA AND VOMITING THAT IS NOT CONTROLLED WITH YOUR NAUSEA MEDICATION  *UNUSUAL SHORTNESS OF BREATH  *UNUSUAL BRUISING OR BLEEDING  TENDERNESS IN MOUTH AND THROAT WITH OR WITHOUT PRESENCE OF ULCERS  *URINARY PROBLEMS  *BOWEL PROBLEMS  UNUSUAL RASH Items with * indicate a potential emergency and should be followed up as soon as possible.  Feel free to call the clinic you have any questions or concerns. The clinic phone number is [phone].  Please show the [place] at check-in to the Emergency Department and triage nurse.

## 2015-03-08 NOTE — Progress Notes (Signed)
[place]  [phone] Fax:(336) [phone]  Clinic Follow up Note   Patient Care Team: Glendale Chard, MD as PCP - General (Internal Medicine) Adrian Prows, MD as Consulting Physician (Cardiology) Jovita Gamma, MD as Consulting Physician (Neurosurgery) Lowella Bandy, MD as Consulting Physician (Urology) Grace Isaac, MD as Consulting Physician (Cardiothoracic Surgery) Truitt Merle, MD as Consulting Physician (Hematology) Tania Ade, RN as Registered Nurse Carol Ada, MD as Consulting Physician (Gastroenterology) Gery Pray, MD as Consulting Physician (Radiation Oncology) 03/08/2015  CHIEF COMPLAINTS:  Follow up esophageal squamous cell carcinoma  Oncology History   Squamous cell esophageal cancer   Staging form: Esophagus - Squamous Cell Carcinoma, AJCC 7th Edition     Clinical: Stage IV (TX, N0, M1) - Unsigned       Squamous cell esophageal cancer ([place])   01/02/2015 Initial Diagnosis Squamous cell esophageal cancer   01/02/2015 Procedure EGD by Dr. Benson Norway showed a large, fungating mass with no bleeding and with stigmata met of recent bleeding was found in the third of the esophagus. The mass was completely obstructing and circumferential. The stomach and examined duodenum were normal.   01/02/2015 Imaging CT CAP showed 9 cm segment of irregular his social worker thickening. Single enlarged subcarinal lymph node, bilateral pulmonary nodules (3), 9m right middle lobe next to right atrium, 3 RUL and a 7 mm lingular lung nodules.    01/02/2015 Initial Biopsy Esophageal mass biopsy showed poorly differentiated squamous cell carcinoma.   01/18/2015 Imaging PET scan showed hypermetabolic esophageal mass, subcarinal and possible right hilar adenopathy, several lung nodules, and a hypermetabolic bone lesion in right ilium, compatible with metastatic disease.    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 male is here because of recently diagnosed esophageal squamous cell carcinoma.  He has had dysphagia for one month, with solid food mainly, he is able to keep liquid and soft diet down. He lost about 30 lbs in the the past month, but his weight has been stable in the past week since he started taking boost. He had some chest pain when he swallows. He is constipated, on stool softener, no bleeding.  He otherwise feels fine. His energy level is fair, he functions very well, no limitation on his physical activities. No nausea, no other pain, cough or other, he drinks about 2 boost a day, soup,and some soft diet.  He was referred to gastroenterologist Dr. HBenson Norwayby his primary care physician. He underwent EGD which showed a circumferential, obstructing, fungating mass in the third esophagus, biopsy showed poorly differentiated squamous cell carcinoma. CT scan chest abdomen and pelvis showed 3 lung nodules, with the largest 1.2 cm in the right middle lobe next to the right atrium.  CURRENT THERAPY: first line chemotherapy with mFOLFOX6, no 5-fu bolus, started on 02/22/15   INTERIM HISTORY GChonreturns for follow-up, he tolerated the first cycle chemotherapy very well, no significant nausea, diarrhea, change of his appetite or other side effects. His dysphagia has improved, he eats bread sometime. He takes morphine once a day on average, his pain is much better. No other new complaints. He is eating better, he gained 7 pounds in the past 2 weeks. No fever or chills.  MEDICAL HISTORY:  Past Medical History  Diagnosis Date  . Coronary artery  disease   . Shortness of breath   . Seizures ([place])   . Hypertension   . Pneumonia   . COPD (chronic obstructive pulmonary disease) ([place])   . Stroke ([place])   . Esophageal cancer [facility])     SURGICAL  HISTORY: Past Surgical History  Procedure Laterality Date  . Cardiac catheterization    . Coronary angioplasty with stent placement  07/22/2011  . Cardiac valve surgery    . Coronary artery bypass graft    . Lower extremity angiogram N/A 07/01/2011    Procedure: LOWER EXTREMITY ANGIOGRAM;  Surgeon: Laverda Page, MD;  Location: [facility] CATH LAB;  Service: Cardiovascular;  Laterality: N/A;  . Left heart catheterization with coronary angiogram N/A 07/01/2011    Procedure: LEFT HEART CATHETERIZATION WITH CORONARY ANGIOGRAM;  Surgeon: Laverda Page, MD;  Location: [facility] CATH LAB;  Service: Cardiovascular;  Laterality: N/A;  . Percutaneous coronary rotoblator intervention (pci-r) N/A 07/22/2011    Procedure: PERCUTANEOUS CORONARY ROTOBLATOR INTERVENTION (PCI-R);  Surgeon: Laverda Page, MD;  Location: [facility] CATH LAB;  Service: Cardiovascular;  Laterality: N/A;  . Left heart catheterization with coronary angiogram N/A 10/30/2011    Procedure: LEFT HEART CATHETERIZATION WITH CORONARY ANGIOGRAM;  Surgeon: Laverda Page, MD;  Location: [facility] CATH LAB;  Service: Cardiovascular;  Laterality: N/A;  . Esophagogastroduodenoscopy  01/02/15    SOCIAL HISTORY: Social History   Social History  . Marital Status: Married, lives with his wife     Spouse Name: N/A  . Number of Children: 84, 1 daughter and 3 sons, all lives in [place]   . Years of Education: N/A   Occupational History  . Retired Nature conservation officer    Social History Main Topics  . Smoking status: Current Every Day Smoker -- 0.50 packs/day for 50 years    Types: Cigarettes  . Smokeless tobacco: Never Used  . 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 Drug Use: No  . Sexual Activity: Not on file     Comment: did not ask   Other Topics Concern  . Not on file   Social History Narrative    FAMILY HISTORY: Family History  Problem Relation Age of Onset  . Heart disease Mother   .  Hypertension Mother   . Heart disease Sister   . Hypertension Sister   . Heart disease Brother   . Hypertension Daughter     ALLERGIES:  has No Known Allergies.  MEDICATIONS:  Current Outpatient Prescriptions  Medication Sig Dispense Refill  . albuterol (PROVENTIL HFA;VENTOLIN HFA) 108 (90 BASE) MCG/ACT inhaler Inhale 1-2 puffs into the lungs every 6 (six) hours as needed for wheezing or shortness of breath.    . hyaluronate sodium (RADIAPLEXRX) GEL Apply 1 application topically 2 (two) times daily.    . isosorbide mononitrate (IMDUR) 120 MG 24 hr tablet Take 1 tablet (120 mg total) by mouth daily. 30 tablet 2  . lidocaine-prilocaine (EMLA) cream Apply 1 application topically as needed. 30 g 2  . lidocaine-prilocaine (EMLA) cream Apply to affected area once 30 g 3  . morphine (ROXANOL) 20 MG/ML concentrated solution Take 0.25 mLs (5 mg total) by mouth every 6 (six) hours as needed for severe pain. 30 mL 0  . ondansetron (ZOFRAN) 8 MG tablet Take 1 tablet (8 mg total) by mouth 2 (two) times daily. Start the day after chemo for 2 days. Then take as needed for nausea or  vomiting. 30 tablet 1  . phenytoin (DILANTIN) 100 MG ER capsule Take 100 mg by mouth 2 (two) times daily.    . Polyvinyl Alcohol-Povidone (REFRESH OP) Place 1 drop into both eyes daily as needed (dry eyes).    . pravastatin (PRAVACHOL) 40 MG tablet Take 40 mg by mouth every evening.     Marland Kitchen PRESCRIPTION MEDICATION Chemo [facility]    . sucralfate (CARAFATE) 1 G tablet Take 1 tablet (1 g total) by mouth 4 (four) times daily -  with meals and at bedtime. 50 tablet 1  . terazosin (HYTRIN) 10 MG capsule Take 10 mg by mouth daily.  0  . triamterene-hydrochlorothiazide (MAXZIDE-25) 37.5-25 MG per tablet Take 1 tablet by mouth daily. 30 tablet 2  . aspirin EC 81 MG tablet Take 81 mg by mouth daily.     No current facility-administered medications for this visit.   Facility-Administered Medications Ordered in Other Visits  Medication Dose  Route Frequency Provider Last Rate Last Dose  . 0.9 %  sodium chloride infusion   Intravenous Once Truitt Merle, MD        REVIEW OF SYSTEMS:   Constitutional: Denies fevers, chills or abnormal night sweats, (+) weight loss  Eyes: Denies blurriness of vision, double vision or watery eyes Ears, nose, mouth, throat, and face: Denies mucositis or sore throat Respiratory: Denies cough, dyspnea or wheezes Cardiovascular: Denies palpitation, chest discomfort or lower extremity swelling Gastrointestinal:  (+) Dysphagia, mild heartburn, Denies nausea, vomiting or change in bowel habits Skin: Denies abnormal skin rashes Lymphatics: Denies new lymphadenopathy or easy bruising Neurological:Denies numbness, tingling or new weaknesses Behavioral/Psych: Mood is stable, no new changes  All other systems were reviewed with the patient and are negative.  PHYSICAL EXAMINATION: ECOG PERFORMANCE STATUS: 1 - Symptomatic but completely ambulatory  Filed Vitals:   03/08/15 1019  BP: 134/70  Pulse: 90  Temp: 98.6 F (37 C)  Resp: 18   Filed Weights   03/08/15 1019  Weight: 127 lb 6.4 oz (57.788 kg)    GENERAL:alert, no distress and comfortable SKIN: skin color, texture, turgor are normal, no rashes or significant lesions EYES: normal, conjunctiva are pink and non-injected, sclera clear OROPHARYNX:no exudate, no erythema and lips, buccal mucosa, and tongue normal  NECK: supple, thyroid normal size, non-tender, without nodularity LYMPH:  no palpable lymphadenopathy in the cervical, axillary or inguinal LUNGS: clear to auscultation and percussion with normal breathing effort HEART: regular rate & rhythm and no murmurs and no lower extremity edema ABDOMEN:abdomen soft, non-tender and normal bowel sounds Musculoskeletal:no cyanosis of digits and no clubbing  PSYCH: alert & oriented x 3 with fluent speech NEURO: no focal motor/sensory deficits  LABORATORY DATA:  I have reviewed the data as listed CBC  Latest Ref Rng 03/08/2015 02/19/2015 02/13/2015  WBC 4.0 - 10.3 10e3/uL 2.6(L) 7.8 8.2  Hemoglobin 13.0 - 17.1 g/dL 10.3(L) 12.0(L) 11.8(L)  Hematocrit 38.4 - 49.9 % 30.4(L) 35.0(L) 34.6(L)  Platelets 140 - 400 10e3/uL 226 242 261    CMP Latest Ref Rng 03/08/2015 02/19/2015 02/13/2015  Glucose 70 - 140 mg/dl 138 152(H) 145(H)  BUN 7.0 - 26.0 mg/dL 7.1 14.8 18.6  Creatinine 0.7 - 1.3 mg/dL 0.7 0.8 0.9  Sodium 136 - 145 mEq/L 138 139 139  Potassium 3.5 - 5.1 mEq/L 3.9 4.1 3.8  Chloride 101 - 111 mmol/L - - -  CO2 22 - 29 mEq/L 29 29 30(H)  Calcium 8.4 - 10.4 mg/dL 8.8 9.1 9.2  Total Protein 6.4 -  8.3 g/dL 7.2 8.0 8.0  Total Bilirubin 0.20 - 1.20 mg/dL 0.49 0.47 0.33  Alkaline Phos 40 - 150 U/L 178(H) 197(H) 178(H)  AST 5 - 34 U/L 38(H) 26 30  ALT 0 - 55 U/L 38 24 35   Pathology report  Diagnosis 01/26/2015 Bone, biopsy, r iliac - 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 Microscopic Comment The metastatic carcinoma is positive by immunohistochemistry with p63, cytokeratin 903 and cytokeratin 5/6. The tumor is negative with CD56, synaptophysin, chromogranin, thyroid transcription factor-1 and S100. (JDP:gt, 01/30/15)   RADIOGRAPHIC STUDIES: I have personally reviewed the radiological images as listed and agreed with the findings in the report.  PET 01/18/2015 IMPRESSION: 1. Previously described esophageal mass is diffusely hypermetabolic. Today's study demonstrates associated hypermetabolic subcarinal and possible right hilar lymphadenopathy, several pulmonary nodules in the lungs bilaterally (largest of which are hypermetabolic), and a hypermetabolic lesion in the superior aspect of the right ilium, compatible with metastatic disease. 2. Atherosclerosis, including left main and 3 vessel coronary artery disease. 3. Additional incidental findings, as above.   ASSESSMENT & PLAN: 69 year old African-American male, with past medical history of hypertension, history of  stroke, coronary artery disease, who presents with progressive dysphasia.  1. 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 -I discussed his right iliac bone biopsy results with patient and his son, unfortunately biopsy confirmed metastatic squamous cell carcinoma. -We reviewed the natural course of esophagitis cancer, and the incurable nature of his disease. We emphasized the goal of therapy is palliation and prolonged his life. -He has completed a short course of palliative radiation  -He is on first-line FOLFOX. First cycle tolerated well.  -Lab reviewed, mild leukopenia, ANC 1.4, adequate for treatment, we'll proceed with cycle 2 FOLFOX today -repeat PET scan after 5-6 cycle chemo   -he will follow up dietician Pamala Hurry    2. Dysphagia and weight loss -secondary to #1 -follow up with dietician, I encourage him to drink more nutrition supplement  -Improved, he gained 7 pounds in the past 2 weeks  3. HTN, CAD, history of seizure and CVA -Follow up with primary care physician -we discussed that we will watch his BP closely, and may need adjust his meds during chemo   4. Chest pain, secondary to his esophageal cancer and radiation -Much improved -Continue morphine as needed    Plan -second cycle FOLFOX today, no 5-fu bolus or neulasta  -I will see him back in 2 weeks    All questions were answered. The patient knows to call the clinic with any problems, questions or concerns.  I spent 20 minutes counseling the patient face to face. The total time spent in the appointment was 25 minutes and more than 50% was on counseling.     Truitt Merle, MD 03/08/2015 10:50 AM

## 2015-03-08 NOTE — Telephone Encounter (Signed)
per pof tos ch pt appt-sent MW email to sch pt trmt-will call pt after reply °

## 2015-03-09 ENCOUNTER — Telehealth: Payer: Self-pay | Admitting: *Deleted

## 2015-03-09 NOTE — Telephone Encounter (Signed)
Per staff message and POF I have scheduled appts. Advised scheduler of appts. JMW  

## 2015-03-10 ENCOUNTER — Encounter (HOSPITAL_COMMUNITY): Payer: Self-pay | Admitting: Emergency Medicine

## 2015-03-10 ENCOUNTER — Ambulatory Visit (HOSPITAL_BASED_OUTPATIENT_CLINIC_OR_DEPARTMENT_OTHER): Payer: Medicare Other

## 2015-03-10 ENCOUNTER — Emergency Department (HOSPITAL_COMMUNITY)
Admission: EM | Admit: 2015-03-10 | Discharge: 2015-03-10 | Disposition: A | Discharge status: Home / Self Care | Payer: Medicare Other | Attending: Emergency Medicine | Admitting: Emergency Medicine

## 2015-03-10 VITALS — BP 115/56 | HR 89 | Temp 98.7°F | Resp 17

## 2015-03-10 DIAGNOSIS — Z8673 Personal history of transient ischemic attack (TIA), and cerebral infarction without residual deficits: Secondary | ICD-10-CM | POA: Diagnosis not present

## 2015-03-10 DIAGNOSIS — Z9861 Coronary angioplasty status: Secondary | ICD-10-CM | POA: Diagnosis not present

## 2015-03-10 DIAGNOSIS — Z79899 Other long term (current) drug therapy: Secondary | ICD-10-CM | POA: Diagnosis not present

## 2015-03-10 DIAGNOSIS — Z8701 Personal history of pneumonia (recurrent): Secondary | ICD-10-CM | POA: Insufficient documentation

## 2015-03-10 DIAGNOSIS — Z9889 Other specified postprocedural states: Secondary | ICD-10-CM | POA: Insufficient documentation

## 2015-03-10 DIAGNOSIS — D649 Anemia, unspecified: Secondary | ICD-10-CM | POA: Insufficient documentation

## 2015-03-10 DIAGNOSIS — Z8501 Personal history of malignant neoplasm of esophagus: Secondary | ICD-10-CM | POA: Diagnosis not present

## 2015-03-10 DIAGNOSIS — Z452 Encounter for adjustment and management of vascular access device: Secondary | ICD-10-CM

## 2015-03-10 DIAGNOSIS — Z951 Presence of aortocoronary bypass graft: Secondary | ICD-10-CM | POA: Insufficient documentation

## 2015-03-10 DIAGNOSIS — Z7982 Long term (current) use of aspirin: Secondary | ICD-10-CM | POA: Insufficient documentation

## 2015-03-10 DIAGNOSIS — G40909 Epilepsy, unspecified, not intractable, without status epilepticus: Secondary | ICD-10-CM | POA: Insufficient documentation

## 2015-03-10 DIAGNOSIS — I10 Essential (primary) hypertension: Secondary | ICD-10-CM | POA: Diagnosis not present

## 2015-03-10 DIAGNOSIS — D72819 Decreased white blood cell count, unspecified: Secondary | ICD-10-CM | POA: Diagnosis not present

## 2015-03-10 DIAGNOSIS — R569 Unspecified convulsions: Secondary | ICD-10-CM

## 2015-03-10 DIAGNOSIS — J449 Chronic obstructive pulmonary disease, unspecified: Secondary | ICD-10-CM | POA: Diagnosis not present

## 2015-03-10 DIAGNOSIS — I2510 Atherosclerotic heart disease of native coronary artery without angina pectoris: Secondary | ICD-10-CM | POA: Insufficient documentation

## 2015-03-10 DIAGNOSIS — C155 Malignant neoplasm of lower third of esophagus: Secondary | ICD-10-CM | POA: Diagnosis not present

## 2015-03-10 DIAGNOSIS — E871 Hypo-osmolality and hyponatremia: Secondary | ICD-10-CM | POA: Insufficient documentation

## 2015-03-10 DIAGNOSIS — Z87891 Personal history of nicotine dependence: Secondary | ICD-10-CM | POA: Diagnosis not present

## 2015-03-10 LAB — BASIC METABOLIC PANEL
Anion gap: 9 (ref 5–15)
BUN: 9 mg/dL (ref 6–20)
CALCIUM: 8.2 mg/dL — AB (ref 8.9–10.3)
CO2: 28 mmol/L (ref 22–32)
CREATININE: 0.67 mg/dL (ref 0.61–1.24)
Chloride: 96 mmol/L — ABNORMAL LOW (ref 101–111)
Glucose, Bld: 127 mg/dL — ABNORMAL HIGH (ref 65–99)
Potassium: 3.9 mmol/L (ref 3.5–5.1)
SODIUM: 133 mmol/L — AB (ref 135–145)

## 2015-03-10 LAB — CBC WITH DIFFERENTIAL/PLATELET
BASOS ABS: 0 10*3/uL (ref 0.0–0.1)
BASOS PCT: 0 %
EOS ABS: 0 10*3/uL (ref 0.0–0.7)
EOS PCT: 0 %
HCT: 28.9 % — ABNORMAL LOW (ref 39.0–52.0)
Hemoglobin: 10.1 g/dL — ABNORMAL LOW (ref 13.0–17.0)
Lymphocytes Relative: 14 %
Lymphs Abs: 0.4 10*3/uL — ABNORMAL LOW (ref 0.7–4.0)
MCH: 29.7 pg (ref 26.0–34.0)
MCHC: 34.9 g/dL (ref 30.0–36.0)
MCV: 85 fL (ref 78.0–100.0)
Monocytes Absolute: 0.3 10*3/uL (ref 0.1–1.0)
Monocytes Relative: 12 %
NEUTROS PCT: 74 %
Neutro Abs: 1.9 10*3/uL (ref 1.7–7.7)
PLATELETS: 344 10*3/uL (ref 150–400)
RBC: 3.4 MIL/uL — AB (ref 4.22–5.81)
RDW: 13.5 % (ref 11.5–15.5)
WBC: 2.6 10*3/uL — AB (ref 4.0–10.5)

## 2015-03-10 LAB — PHENYTOIN LEVEL, TOTAL: PHENYTOIN LVL: 2.6 ug/mL — AB (ref 10.0–20.0)

## 2015-03-10 MED ORDER — SODIUM CHLORIDE 0.9 % IV SOLN
1000.0000 mg | Freq: Once | INTRAVENOUS | Status: AC
  Administered 2015-03-10: 1000 mg via INTRAVENOUS
  Filled 2015-03-10: qty 20

## 2015-03-10 MED ORDER — SODIUM CHLORIDE 0.9 % IJ SOLN
10.0000 mL | INTRAMUSCULAR | Status: DC | PRN
  Administered 2015-03-10: 10 mL
  Filled 2015-03-10: qty 10

## 2015-03-10 MED ORDER — HEPARIN SOD (PORK) LOCK FLUSH 100 UNIT/ML IV SOLN
500.0000 [IU] | Freq: Once | INTRAVENOUS | Status: AC | PRN
  Administered 2015-03-10: 500 [IU]
  Filled 2015-03-10: qty 5

## 2015-03-10 NOTE — ED Notes (Signed)
Daughter at bedside states that he has esophageal cancer. States that he probably has forgotten to take his medications for seizure due to the amount and variations.

## 2015-03-10 NOTE — Discharge Instructions (Signed)
Make sure to take your phenytoin (Dilantin) exactly as prescribed-twice a day, every day. Please see your PCP in one week to recheck the phenytoin level to make sure that it is staying in the therapeutic range. Your dose of phenytoin may have to be adjusted based on that blood level.   ?????????? ???????? (?????????) ? ???????? (Seizure, Adult) ?????????? ???????? ???????? ?????????? ?????????? ????????????? ?????????? ????????? ?????. ???????? ?????? ?????? ?? 30 ?????? ?? 2 ?????. ?????????? ????????? ???? ?????????. ??????????????? ????? ????????? ????? ????????? ?? ???????????? (????). ???? ????? ???????? ????????? ????????:   ??????? ?????? ??? ????????????.  ???????.  ????????, ??? ??????? ????????? (???????).  ????????? ??????, ????????, ????????? ??????? ??? ????????? ????? ????? ???????. ???????????????? ???????? ?? ????? ?????????:  ????????? ???????? ??? ?????????(?????????? ??????????? ?????????).  ???????? ? ???????????? ????????? ??????????.  ???????? ??????????????.  ????????? ??????? ???????? ??????? ?????.  ???????? ???????.  ?????????????? ?????????????? ??? ?????????.  ??????? ?????? ?? ???.  ???????????? ?????. ????? ???????? ????? ??????????? ??????????? ???????? ? ??????????. ????? ????? ???? ?????? ?????????? ?????? ?????????? ?? ????? ????????. ?????????? ?? ????? ? ???????? ????????   ???? ??? ???????? ?????????, ?????????? ?? ????? ? ???????????? ? ?????????? ???????? ?????.  ????????? ? ????? ?? ??? ??????????? ??????.  ?? ??????? ???????, ?????? ?????????? ??? ?????????? ???????????? ?????? ????????????, ??? ??????? ????????????? ???????? ????? ???????? ? ????????? ????? ???? ? ??????????, ???? ?????? ??? ???? ?? ?????? ??? ???? ???????????? ?????????? ??? ???.  ?????????? ?????????.  ??????? ?????? ? ??????? ????, ??? ???? ??????, ???? ? ??? ????????? ???????. ??? ??????:  ??????? ??? ?? ???/?????, ????? ?? ????????? ???????.  ??? ??????  ????????? ???????.  ?????????? ?????? ?????? ?? ???.  ????????? ???? ?? ???. ???? ???????? ?????, ??? ?? ???????? ??? ???????????? ???????? ???????.  ?????????? ? ????, ???? ?? ?? ????????.  ?????, ????? ?? ?????????? ??????. ?????????? ?????????? ? ?????, ????:  ??????? ?????? ?????? 5 ?????.  ???????? ??????? ??? ?? ?? ????????? ? ???? ????? ????? ????????.  ? ??? ????? ???????? ?????????? ?????????? ??????????? ?????????.  ?? ????????? ???????? ??? ?????????? ?????????? ?????????. ???-?? ?????? ??????? ??? ? ????????? ?????????? ?????? ??? ????????? ? ??????? ?????????? ?????? (?? ???????? 911 ? ???). ?????????, ??? ??:  ????????? ?????? ??????????.  ?????? ?????????????? ??????? ?? ????? ??????????.  ??????????????? ?????????? ? ?????, ???? ??? ?? ?????????? ????? ??? ?????????? ????.   ??? ?????????? ?? ????? ???????? ??????, ??????????????? ????? ??????. ??????????? ???????? ????? ???????????? ??? ??????? ? ????? ??????? ??????.   Document Released: 05/05/2005 Document Revised: 05/26/2014 Elsevier Interactive Patient Education [employer].

## 2015-03-10 NOTE — ED Notes (Signed)
0245 pt had seizure in sleep with incontinence. Pt in post ictal state was able to ambulate, conscious and alert to self not showing any deficits with [facility].   Vitals 142/78 100 HR 20 Resp 117 cbg 100%

## 2015-03-10 NOTE — Progress Notes (Signed)
Pt reports he was in the ED due to seizure activity; pt stated "I wasn't taking my medications like I was supposed to". Pt encouraged to take medications as prescribed, son and pt voice understanding.

## 2015-03-10 NOTE — ED Notes (Signed)
Bed: WA17 Expected date:  Expected time:  Means of arrival:  Comments: 66M seizure +sz hx, post ictal

## 2015-03-10 NOTE — ED Provider Notes (Signed)
CSN: [id number]     Arrival date & time 03/10/15  4315 History  By signing my name below, I, Irene Pap, attest that this documentation has been prepared under the direction and in the presence of Delora Fuel, MD. Electronically Signed: Irene Pap, ED Scribe. 03/10/2015. 3:53 AM.    Chief Complaint  Patient presents with  . Seizures   The history is provided by the patient. No language interpreter was used.  HPI Comments: Andre Guzman is a 69 y.o. Male with a hx of CAD, seizures, HTN, COPD, stroke and esophageal cancer brought in by EMS who presents to the Emergency Department complaining of post-ictal seizure onset PTA. Per triage note, pt had a seizure in his sleep with incontinence. He was able to ambulate in his post-ictal state. Pt states that he does not remember what happened. He is oriented to person and place but not time. He thinks he may have missed doses of his Dilantin. He denies bladder or bowel incontinence, or biting tongue. Vitals en route were BP: 142/78; 100 HR; 20 Resp; 117 CBG; 100% pulse ox  Past Medical History  Diagnosis Date  . Coronary artery disease   . Shortness of breath   . Seizures ([place])   . Hypertension   . Pneumonia   . COPD (chronic obstructive pulmonary disease) ([place])   . Stroke ([place])   . Esophageal cancer [facility])    Past Surgical History  Procedure Laterality Date  . Cardiac catheterization    . Coronary angioplasty with stent placement  07/22/2011  . Cardiac valve surgery    . Coronary artery bypass graft    . Lower extremity angiogram N/A 07/01/2011    Procedure: LOWER EXTREMITY ANGIOGRAM;  Surgeon: Laverda Page, MD;  Location: [facility] CATH LAB;  Service: Cardiovascular;  Laterality: N/A;  . Left heart catheterization with coronary angiogram N/A 07/01/2011    Procedure: LEFT HEART CATHETERIZATION WITH CORONARY ANGIOGRAM;  Surgeon: Laverda Page, MD;  Location: [facility] CATH LAB;  Service: Cardiovascular;  Laterality: N/A;  . Percutaneous  coronary rotoblator intervention (pci-r) N/A 07/22/2011    Procedure: PERCUTANEOUS CORONARY ROTOBLATOR INTERVENTION (PCI-R);  Surgeon: Laverda Page, MD;  Location: [facility] CATH LAB;  Service: Cardiovascular;  Laterality: N/A;  . Left heart catheterization with coronary angiogram N/A 10/30/2011    Procedure: LEFT HEART CATHETERIZATION WITH CORONARY ANGIOGRAM;  Surgeon: Laverda Page, MD;  Location: [facility] CATH LAB;  Service: Cardiovascular;  Laterality: N/A;  . Esophagogastroduodenoscopy  01/02/15   Family History  Problem Relation Age of Onset  . Heart disease Mother   . Hypertension Mother   . Heart disease Sister   . Hypertension Sister   . Heart disease Brother   . Hypertension Daughter    Social History  Substance Use Topics  . Smoking status: Former Smoker -- 0.50 packs/day for 50 years    Types: Cigarettes    Quit date: 12/27/2014  . Smokeless tobacco: Never Used  . Alcohol Use: 0.0 oz/week    0 Standard drinks or equivalent per week     Comment: occasional beer, 1-2 per week, used to drink daily heavily     Review of Systems  Neurological: Positive for seizures.  All other systems reviewed and are negative.  Allergies  Review of patient's allergies indicates no known allergies.  Home Medications   Prior to Admission medications   Medication Sig Start Date End Date Taking? Authorizing Provider  albuterol (PROVENTIL HFA;VENTOLIN HFA) 108 (90 BASE) MCG/ACT inhaler Inhale 1-2  puffs into the lungs every 6 (six) hours as needed for wheezing or shortness of breath.    Historical Provider, MD  aspirin EC 81 MG tablet Take 81 mg by mouth daily.    Historical Provider, MD  hyaluronate sodium (RADIAPLEXRX) GEL Apply 1 application topically 2 (two) times daily.    Historical Provider, MD  isosorbide mononitrate (IMDUR) 120 MG 24 hr tablet Take 1 tablet (120 mg total) by mouth daily. 11/13/13   Adrian Prows, MD  lidocaine-prilocaine (EMLA) cream Apply 1 application topically as needed.  02/16/15   Truitt Merle, MD  lidocaine-prilocaine (EMLA) cream Apply to affected area once 02/19/15   Truitt Merle, MD  morphine (ROXANOL) 20 MG/ML concentrated solution Take 0.25 mLs (5 mg total) by mouth every 6 (six) hours as needed for severe pain. 03/08/15   Truitt Merle, MD  ondansetron (ZOFRAN) 8 MG tablet Take 1 tablet (8 mg total) by mouth 2 (two) times daily. Start the day after chemo for 2 days. Then take as needed for nausea or vomiting. 02/19/15   Truitt Merle, MD  phenytoin (DILANTIN) 100 MG ER capsule Take 100 mg by mouth 2 (two) times daily.    Historical Provider, MD  Polyvinyl Alcohol-Povidone (REFRESH OP) Place 1 drop into both eyes daily as needed (dry eyes).    Historical Provider, MD  pravastatin (PRAVACHOL) 40 MG tablet Take 40 mg by mouth every evening.  01/08/15   Historical Provider, MD  [place]    Historical Provider, MD  sucralfate (CARAFATE) 1 G tablet Take 1 tablet (1 g total) by mouth 4 (four) times daily -  with meals and at bedtime. 01/30/15   Gery Pray, MD  terazosin (HYTRIN) 10 MG capsule Take 10 mg by mouth daily. 01/19/15   Historical Provider, MD  triamterene-hydrochlorothiazide (MAXZIDE-25) 37.5-25 MG per tablet Take 1 tablet by mouth daily. 11/13/13   Adrian Prows, MD   There were no vitals taken for this visit. Physical Exam  Constitutional: He is oriented to person, place, and time. He appears well-developed and well-nourished.  HENT:  Head: Normocephalic and atraumatic.  Eyes: EOM are normal. Pupils are equal, round, and reactive to light.  Neck: Normal range of motion. Neck supple. No JVD present.  Cardiovascular: Normal rate, regular rhythm and normal heart sounds.  Exam reveals no gallop and no friction rub.   No murmur heard. Pulmonary/Chest: Effort normal and breath sounds normal. He has no wheezes. He has no rales. He exhibits no tenderness.  mediport present right upper chest  Abdominal: Soft. Bowel sounds are normal. He exhibits no  distension and no mass. There is no tenderness.  Musculoskeletal: Normal range of motion. He exhibits no edema.  Lymphadenopathy:    He has no cervical adenopathy.  Neurological: He is alert and oriented to person, place, and time. No cranial nerve deficit. He exhibits normal muscle tone. Coordination normal.  Pt is oriented to person and place, but not time  Skin: Skin is warm and dry. No rash noted.  Psychiatric: He has a normal mood and affect. His behavior is normal. Thought content normal.  Nursing note and vitals reviewed.   ED Course  Procedures (including critical care time) DIAGNOSTIC STUDIES: Oxygen Saturation is 98% on room air, normal by my interpretation.    COORDINATION OF CARE: 3:50 AM-Discussed treatment plan which includes labs with pt at bedside and pt agreed to plan.    Labs Review Results for orders placed or performed during the hospital encounter  of 67/34/19  Basic metabolic panel  Result Value Ref Range   Sodium 133 (L) 135 - 145 mmol/L   Potassium 3.9 3.5 - 5.1 mmol/L   Chloride 96 (L) 101 - 111 mmol/L   CO2 28 22 - 32 mmol/L   Glucose, Bld 127 (H) 65 - 99 mg/dL   BUN 9 6 - 20 mg/dL   Creatinine, Ser 0.67 0.61 - 1.24 mg/dL   Calcium 8.2 (L) 8.9 - 10.3 mg/dL   GFR calc non Af Amer >60 >60 mL/min   GFR calc Af Amer >60 >60 mL/min   Anion gap 9 5 - 15  CBC with Differential  Result Value Ref Range   WBC 2.6 (L) 4.0 - 10.5 K/uL   RBC 3.40 (L) 4.22 - 5.81 MIL/uL   Hemoglobin 10.1 (L) 13.0 - 17.0 g/dL   HCT 28.9 (L) 39.0 - 52.0 %   MCV 85.0 78.0 - 100.0 fL   MCH 29.7 26.0 - 34.0 pg   MCHC 34.9 30.0 - 36.0 g/dL   RDW 13.5 11.5 - 15.5 %   Platelets 344 150 - 400 K/uL   Neutrophils Relative % 74 %   Neutro Abs 1.9 1.7 - 7.7 K/uL   Lymphocytes Relative 14 %   Lymphs Abs 0.4 (L) 0.7 - 4.0 K/uL   Monocytes Relative 12 %   Monocytes Absolute 0.3 0.1 - 1.0 K/uL   Eosinophils Relative 0 %   Eosinophils Absolute 0.0 0.0 - 0.7 K/uL   Basophils Relative 0 %    Basophils Absolute 0.0 0.0 - 0.1 K/uL  Phenytoin level, total  Result Value Ref Range   Phenytoin Lvl 2.6 (L) 10.0 - 20.0 ug/mL   I have personally reviewed and evaluated these images and lab results as part of my medical decision-making.   MDM   Final diagnoses:  Seizure secondary to subtherapeutic anticonvulsant medication (HCC)  Hyponatremia  Normochromic normocytic anemia  Leukopenia    Seizure in patient with known seizure disorder. Per EMS, he is back to his baseline.Marland Kitchen He is supposed to be taking phenytoin, so blood level be obtained. It is noted that he is also has a history of esophageal cancer, so electrolytes will be checked.  Phenytoin level is come back extremely low at 2.6. This is probably related to poor compliance. Mild hyponatremia is noted and does not clinically significant. Anemia and leukopenia are unchanged from baseline. He is given a loading dose of any time and will be discharged following this.  I, Burnis Halling, personally performed the services described in this documentation. All medical record entries made by the scribe were at my direction and in my presence.  I have reviewed the chart and discharge instructions and agree that the record reflects my personal performance and is accurate and complete. Sanaya Gwilliam.  37/90/2409. 5:16 AM.      Delora Fuel, MD 73/[id number]/29 [id number]

## 2015-03-10 NOTE — ED Notes (Signed)
Tech reports that pt sts he wants to lay and rest a few more minutes before he leaves. Pt is A&O, VSS and in NAD. Will continue to check on pt to transport out of the dept

## 2015-03-10 NOTE — Patient Instructions (Signed)

## 2015-03-14 ENCOUNTER — Emergency Department (HOSPITAL_COMMUNITY)
Admission: EM | Admit: 2015-03-14 | Discharge: 2015-03-14 | Disposition: A | Discharge status: Home / Self Care | Payer: Medicare Other | Attending: Emergency Medicine | Admitting: Emergency Medicine

## 2015-03-14 ENCOUNTER — Emergency Department (HOSPITAL_COMMUNITY): Payer: Medicare Other

## 2015-03-14 ENCOUNTER — Telehealth: Payer: Self-pay | Admitting: *Deleted

## 2015-03-14 ENCOUNTER — Encounter (HOSPITAL_COMMUNITY): Payer: Self-pay

## 2015-03-14 DIAGNOSIS — I10 Essential (primary) hypertension: Secondary | ICD-10-CM | POA: Diagnosis not present

## 2015-03-14 DIAGNOSIS — Z9889 Other specified postprocedural states: Secondary | ICD-10-CM | POA: Diagnosis not present

## 2015-03-14 DIAGNOSIS — J449 Chronic obstructive pulmonary disease, unspecified: Secondary | ICD-10-CM | POA: Diagnosis not present

## 2015-03-14 DIAGNOSIS — Z8673 Personal history of transient ischemic attack (TIA), and cerebral infarction without residual deficits: Secondary | ICD-10-CM | POA: Diagnosis not present

## 2015-03-14 DIAGNOSIS — Z87891 Personal history of nicotine dependence: Secondary | ICD-10-CM | POA: Diagnosis not present

## 2015-03-14 DIAGNOSIS — I2510 Atherosclerotic heart disease of native coronary artery without angina pectoris: Secondary | ICD-10-CM | POA: Diagnosis not present

## 2015-03-14 DIAGNOSIS — R630 Anorexia: Secondary | ICD-10-CM | POA: Diagnosis present

## 2015-03-14 DIAGNOSIS — Z951 Presence of aortocoronary bypass graft: Secondary | ICD-10-CM | POA: Diagnosis not present

## 2015-03-14 DIAGNOSIS — Z9861 Coronary angioplasty status: Secondary | ICD-10-CM | POA: Diagnosis not present

## 2015-03-14 DIAGNOSIS — Z7982 Long term (current) use of aspirin: Secondary | ICD-10-CM | POA: Diagnosis not present

## 2015-03-14 DIAGNOSIS — E860 Dehydration: Secondary | ICD-10-CM | POA: Diagnosis not present

## 2015-03-14 DIAGNOSIS — Z8701 Personal history of pneumonia (recurrent): Secondary | ICD-10-CM | POA: Insufficient documentation

## 2015-03-14 DIAGNOSIS — C159 Malignant neoplasm of esophagus, unspecified: Secondary | ICD-10-CM | POA: Insufficient documentation

## 2015-03-14 DIAGNOSIS — D849 Immunodeficiency, unspecified: Secondary | ICD-10-CM | POA: Insufficient documentation

## 2015-03-14 DIAGNOSIS — Z79899 Other long term (current) drug therapy: Secondary | ICD-10-CM | POA: Diagnosis not present

## 2015-03-14 HISTORY — DX: Reserved for inherently not codable concepts without codable children: IMO0001

## 2015-03-14 HISTORY — DX: Reserved for concepts with insufficient information to code with codable children: IMO0002

## 2015-03-14 LAB — CBC WITH DIFFERENTIAL/PLATELET
BASOS ABS: 0 10*3/uL (ref 0.0–0.1)
Basophils Relative: 0 %
EOS PCT: 0 %
Eosinophils Absolute: 0 10*3/uL (ref 0.0–0.7)
HCT: 26.9 % — ABNORMAL LOW (ref 39.0–52.0)
Hemoglobin: 9.3 g/dL — ABNORMAL LOW (ref 13.0–17.0)
LYMPHS PCT: 3 %
Lymphs Abs: 0.3 10*3/uL — ABNORMAL LOW (ref 0.7–4.0)
MCH: 29.2 pg (ref 26.0–34.0)
MCHC: 34.6 g/dL (ref 30.0–36.0)
MCV: 84.6 fL (ref 78.0–100.0)
MONO ABS: 0.1 10*3/uL (ref 0.1–1.0)
MONOS PCT: 1 %
Neutro Abs: 8.2 10*3/uL — ABNORMAL HIGH (ref 1.7–7.7)
Neutrophils Relative %: 96 %
Platelets: 335 10*3/uL (ref 150–400)
RBC: 3.18 MIL/uL — ABNORMAL LOW (ref 4.22–5.81)
RDW: 13.4 % (ref 11.5–15.5)
WBC: 8.5 10*3/uL (ref 4.0–10.5)

## 2015-03-14 LAB — COMPREHENSIVE METABOLIC PANEL
ALBUMIN: 2.1 g/dL — AB (ref 3.5–5.0)
ALK PHOS: 142 U/L — AB (ref 38–126)
ALT: 20 U/L (ref 17–63)
AST: 26 U/L (ref 15–41)
Anion gap: 9 (ref 5–15)
BILIRUBIN TOTAL: 0.5 mg/dL (ref 0.3–1.2)
BUN: 10 mg/dL (ref 6–20)
CALCIUM: 8 mg/dL — AB (ref 8.9–10.3)
CO2: 29 mmol/L (ref 22–32)
CREATININE: 0.56 mg/dL — AB (ref 0.61–1.24)
Chloride: 96 mmol/L — ABNORMAL LOW (ref 101–111)
GFR calc Af Amer: >60 mL/min (ref >60)
GLUCOSE: 192 mg/dL — AB (ref 65–99)
Potassium: 3.5 mmol/L (ref 3.5–5.1)
Sodium: 134 mmol/L — ABNORMAL LOW (ref 135–145)
TOTAL PROTEIN: 7 g/dL (ref 6.5–8.1)

## 2015-03-14 LAB — URINALYSIS, ROUTINE W REFLEX MICROSCOPIC
Glucose, UA: NEGATIVE mg/dL
HGB URINE DIPSTICK: NEGATIVE
Ketones, ur: NEGATIVE mg/dL
Leukocytes, UA: NEGATIVE
Nitrite: NEGATIVE
PH: 6.5 (ref 5.0–8.0)
Protein, ur: 30 mg/dL — AB
SPECIFIC GRAVITY, URINE: 1.013 (ref 1.005–1.030)
UROBILINOGEN UA: 2 mg/dL — AB (ref 0.0–1.0)

## 2015-03-14 LAB — URINE MICROSCOPIC-ADD ON

## 2015-03-14 LAB — LACTIC ACID, PLASMA
Lactic Acid, Venous: 1 mmol/L (ref 0.5–2.0)
Lactic Acid, Venous: 0.9 mmol/L (ref 0.5–2.0)

## 2015-03-14 MED ORDER — HEPARIN SOD (PORK) LOCK FLUSH 100 UNIT/ML IV SOLN
500.0000 [IU] | Freq: Once | INTRAVENOUS | Status: AC
  Administered 2015-03-14: 500 [IU]
  Filled 2015-03-14: qty 5

## 2015-03-14 MED ORDER — SODIUM CHLORIDE 0.9 % IV BOLUS (SEPSIS)
500.0000 mL | Freq: Once | INTRAVENOUS | Status: AC
  Administered 2015-03-14: 500 mL via INTRAVENOUS

## 2015-03-14 NOTE — ED Notes (Signed)
One blood culture obtain via [facility] porta cath

## 2015-03-14 NOTE — ED Provider Notes (Signed)
Please see previous provider's note regarding patient's presenting history and physical, initial ED course, and associated medical decision making. In short this is a 69 year old male with history of esophageal carcinoma receiving active chemotherapy who presents with hypotension. This is in the setting of decreased by mouth intake due to difficulty swallowing. He had stated that he is supposed to take 4 Boost drinks throughout the day, but has barely been drinking one. He was also found to be orthostatic here in the emergency department. Broad workup was pursued revealing no infectious etiology and no evidence of poor perfusion.  At time of patient's hand over, plan from prior physicians with pending reevaluation after rehydration, with plans to admit if symptoms did not improve or discharge home if feeling well. Pending UA as well.  UA showing no evidence of infection. Remainder of blood work reviewed and otherwise unremarkable. He had received IV fluids and ate a meal in the emergency department without issues. Repeat orthostatics are improved. He is able to ambulate steadily in ED without symptoms. Discussed options of care with patient and family. Given recent CTX/immunosuppresion and risk for illness, did not think best to admit for rehydration. I feel patient is appropriately stabilized for discharge home.  Patient agreed to increase Boost intake at home and has scheduled outpatient follow-up appointment tomorrow for re-check.     Forde Dandy, MD 03/14/15 [phone]

## 2015-03-14 NOTE — ED Notes (Signed)
Pt reports that he was sent by PCP, due to hypotension.  Denies pain.  Denies n/v/d.  Denies dizziness.  Hx of esophageal CA.  Last chemo x 6 days ago.  Family is unsure of BP in office.

## 2015-03-14 NOTE — ED Notes (Signed)
Pt dressed and ambulated with out assistance

## 2015-03-14 NOTE — Discharge Instructions (Signed)
Return without fail for worsening symptoms, including vomiting unable to keep down food or fluids, confusion, feeling lightheaded or passing out, concern for dehydration, fever, or any other symptoms concerning to you.   Dehydration, Adult Dehydration means your body does not have as much fluid or water as it needs. It happens when you take in less fluid than you lose. Your kidneys, brain, and heart will not work properly without the right amount of fluids.  Dehydration can range from mild to severe. It should be treated right away to help prevent it from becoming severe. HOME CARE  Drink enough fluid to keep your pee (urine) clear or pale yellow.  Drink water or fluid slowly by taking small sips. You can also try sucking on ice cubes.  Have food or drinks that contain electrolytes. Examples include bananas and sports drinks.  Take over-the-counter and prescription medicines only as told by your doctor.  Prepare oral rehydration solution (ORS) according to the instructions that came with it. Take sips of ORS every 5 minutes until your pee returns to normal.  If you are throwing up (vomiting) or have watery poop (diarrhea), keep trying to drink water, ORS, or both.  If you have watery poop, avoid:  Drinks with caffeine.  Fruit juice.  Milk.  Carbonated soft drinks.  Do not take salt tablets. This can lead to having too much sodium in your body (hypernatremia). GET HELP IF:  You cannot eat or drink without throwing up.  You have had mild watery poop for longer than 24 hours.  You have a fever. GET HELP RIGHT AWAY IF:   You have very strong thirst.  You have very bad watery poop.  You have not peed in 6-8 hours, or you have peed only a small amount of very dark pee.  You have shriveled skin.  You are dizzy, confused, or both.   This information is not intended to replace advice given to you by your health care provider. Make sure you discuss any questions you have with  your health care provider.   Document Released: 03/01/2009 Document Revised: 01/24/2015 Document Reviewed: 09/20/2014 Elsevier Interactive Patient Education [employer].

## 2015-03-14 NOTE — ED Notes (Signed)
Pt alert, oriented, and wheeled to the lobby by NT Marcella. Pt was advised to follow up with PCP in 1-2 days and come back if he worsens.

## 2015-03-14 NOTE — ED Provider Notes (Signed)
CSN: [id number]     Arrival date & time 03/14/15  1253 History   First MD Initiated Contact with Patient 03/14/15 1302     Chief Complaint  Patient presents with  . Hypotension  . CA Pt      (Consider location/radiation/quality/duration/timing/severity/associated sxs/prior Treatment) The history is provided by the patient, a relative and medical records. No language interpreter was used.   Andre Guzman is a 69 yo AAM who was sent from PCP for hypotension noticed at routine follow up. He is currently not complaining of any pain. He has PMH of esophageal cancer and received his last chemo treatment on Thursday (03/08/15). He has had a decrease in appetite recently and has not been adequately hydrating. Patient admits to dizziness first noticed today, but no other sxs.   Past Medical History  Diagnosis Date  . Coronary artery disease   . Shortness of breath   . Seizures ([place])   . Hypertension   . Pneumonia   . COPD (chronic obstructive pulmonary disease) ([place])   . Stroke ([place])   . Esophageal cancer [facility])    Past Surgical History  Procedure Laterality Date  . Cardiac catheterization    . Coronary angioplasty with stent placement  07/22/2011  . Cardiac valve surgery    . Coronary artery bypass graft    . Lower extremity angiogram N/A 07/01/2011    Procedure: LOWER EXTREMITY ANGIOGRAM;  Surgeon: Laverda Page, MD;  Location: [facility] CATH LAB;  Service: Cardiovascular;  Laterality: N/A;  . Left heart catheterization with coronary angiogram N/A 07/01/2011    Procedure: LEFT HEART CATHETERIZATION WITH CORONARY ANGIOGRAM;  Surgeon: Laverda Page, MD;  Location: [facility] CATH LAB;  Service: Cardiovascular;  Laterality: N/A;  . Percutaneous coronary rotoblator intervention (pci-r) N/A 07/22/2011    Procedure: PERCUTANEOUS CORONARY ROTOBLATOR INTERVENTION (PCI-R);  Surgeon: Laverda Page, MD;  Location: [facility] CATH LAB;  Service: Cardiovascular;  Laterality: N/A;  . Left heart catheterization  with coronary angiogram N/A 10/30/2011    Procedure: LEFT HEART CATHETERIZATION WITH CORONARY ANGIOGRAM;  Surgeon: Laverda Page, MD;  Location: [facility] CATH LAB;  Service: Cardiovascular;  Laterality: N/A;  . Esophagogastroduodenoscopy  01/02/15   Family History  Problem Relation Age of Onset  . Heart disease Mother   . Hypertension Mother   . Heart disease Sister   . Hypertension Sister   . Heart disease Brother   . Hypertension Daughter    Social History  Substance Use Topics  . Smoking status: Former Smoker -- 0.50 packs/day for 50 years    Types: Cigarettes    Quit date: 12/27/2014  . Smokeless tobacco: Never Used  . Alcohol Use: 0.0 oz/week    0 Standard drinks or equivalent per week     Comment: occasional beer, 1-2 per week, used to drink daily heavily     Review of Systems  Constitutional: Positive for appetite change. Negative for fever, chills, diaphoresis, activity change and fatigue.  HENT: Negative for congestion, hearing loss, rhinorrhea, sore throat and trouble swallowing.   Eyes: Negative for visual disturbance.  Respiratory: Negative for cough, chest tightness, shortness of breath and wheezing.   Cardiovascular: Negative.   Gastrointestinal: Negative for nausea, vomiting, abdominal pain, diarrhea and constipation.  Endocrine: Negative for polydipsia and polyuria.  Musculoskeletal: Negative for myalgias, back pain, arthralgias and neck pain.  Skin: Negative for rash.  Neurological: Positive for dizziness and light-headedness. Negative for weakness and headaches.      Allergies  Review of  patient's allergies indicates no known allergies.  Home Medications   Prior to Admission medications   Medication Sig Start Date End Date Taking? Authorizing Provider  albuterol (PROVENTIL HFA;VENTOLIN HFA) 108 (90 BASE) MCG/ACT inhaler Inhale 1-2 puffs into the lungs every 6 (six) hours as needed for wheezing or shortness of breath.    Historical Provider, MD  aspirin EC  81 MG tablet Take 81 mg by mouth daily.    Historical Provider, MD  hyaluronate sodium (RADIAPLEXRX) GEL Apply 1 application topically 2 (two) times daily.    Historical Provider, MD  HYDROcodone-acetaminophen (HYCET) 7.5-325 mg/15 ml solution Take 10 mLs by mouth 4 (four) times daily as needed. pain 02/06/15   Historical Provider, MD  isosorbide mononitrate (IMDUR) 120 MG 24 hr tablet Take 1 tablet (120 mg total) by mouth daily. 11/13/13   Adrian Prows, MD  lidocaine-prilocaine (EMLA) cream Apply to affected area once 02/19/15   Truitt Merle, MD  morphine (ROXANOL) 20 MG/ML concentrated solution Take 0.25 mLs (5 mg total) by mouth every 6 (six) hours as needed for severe pain. 03/08/15   Truitt Merle, MD  ondansetron (ZOFRAN) 8 MG tablet Take 1 tablet (8 mg total) by mouth 2 (two) times daily. Start the day after chemo for 2 days. Then take as needed for nausea or vomiting. 02/19/15   Truitt Merle, MD  phenytoin (DILANTIN) 100 MG ER capsule Take 100-200 mg by mouth See admin instructions. First day take 100 mg in the morning and 200mg  at night . Next day 200mg  BID. Patient rotates and repeats    Historical Provider, MD  Polyvinyl Alcohol-Povidone (REFRESH OP) Place 1 drop into both eyes daily as needed (dry eyes).    Historical Provider, MD  pravastatin (PRAVACHOL) 40 MG tablet Take 40 mg by mouth every evening.  01/08/15   Historical Provider, MD  [place]    Historical Provider, MD  sucralfate (CARAFATE) 1 G tablet Take 1 tablet (1 g total) by mouth 4 (four) times daily -  with meals and at bedtime. 01/30/15   Gery Pray, MD  terazosin (HYTRIN) 10 MG capsule Take 10 mg by mouth daily. 01/19/15   Historical Provider, MD  triamterene-hydrochlorothiazide (MAXZIDE-25) 37.5-25 MG per tablet Take 1 tablet by mouth daily. 11/13/13   Adrian Prows, MD   BP 98/60 mmHg  Pulse 109  Temp(Src) 97.7 F (36.5 C) (Oral)  Resp 16  SpO2 99% Physical Exam  Constitutional: He is oriented to person, place, and  time. He appears well-developed and well-nourished.  Alert and in no acute distress  HENT:  Head: Normocephalic and atraumatic.  Mouth/Throat: No oropharyngeal exudate.  OP clear no erythema; mucous membranes tacky   Cardiovascular: Normal rate, regular rhythm, normal heart sounds and intact distal pulses.  Exam reveals no gallop and no friction rub.   No murmur heard. Pulmonary/Chest: Effort normal and breath sounds normal. No respiratory distress. He has no wheezes. He has no rales. He exhibits no tenderness.  Abdominal: Soft. Bowel sounds are normal. He exhibits no distension and no mass. There is no tenderness. There is no rebound and no guarding.  Abdomen soft, non-tender, non-distended Bowel sounds positive in all four quadrants   Musculoskeletal: Normal range of motion. He exhibits no edema.  Neurological: He is alert and oriented to person, place, and time. No cranial nerve deficit.  Skin: Skin is warm and dry. No rash noted.  Skin dry, skin turgor present, cap refill slightly delayed.   Psychiatric: He has  a normal mood and affect. His behavior is normal. Judgment and thought content normal.  Nursing note and vitals reviewed.   ED Course  Procedures (including critical care time) Labs Review Labs Reviewed  CULTURE, BLOOD (ROUTINE X 2)  CULTURE, BLOOD (ROUTINE X 2)  CBC WITH DIFFERENTIAL/PLATELET  COMPREHENSIVE METABOLIC PANEL  LACTIC ACID, PLASMA  LACTIC ACID, PLASMA  URINALYSIS, ROUTINE W REFLEX MICROSCOPIC (NOT AT [facility])    Imaging Review No results found. I have personally reviewed and evaluated these images and lab results as part of my medical decision-making.   EKG Interpretation None      MDM   Final diagnoses:  None  [name] presents from PCP with hypotension and no complaints at this time. At arrival BP was 98/60 and pulse 109. Due to his recent chemo, patient is immunocompromised, and therefore a broader workup for hypotension has been taken to  rule out infection. Patient has significant cardiac history, so troponin and EKG ordered as well. Patient does appear very dehydrated and patient admits to not drinking many fluids. Will order fluid bolus, assess orthostatics, then re-eval patient and see if BP and HR have improved while awaiting other pending lab results. Plan of care discussed with patient and family who verbalized understanding and agree with plan.   2:47 PM - Patient re-evaluated. Orthostatic VS reviewed -- SBP dropped 17 and DBP dropped 16; pulse increased by 10. Will continue 500 fluids and reassess. CBC shows no signs of infection; H&H 9.3/26.9    4:01 PM Pt re-evaluated. HR 88 in room, BP 107/67. CXR reviewed and shows no PNA or any active cardio pulm disease. Patient is aware of the need for urine.    Care passed to oncoming provider, case discussed with Dr. Oleta Mouse, plan agreed upon.  Ozella Almond Ward, PA-C   [facility], [place] 03/14/15 1631  Noemi Chapel, MD 03/15/15 [phone]

## 2015-03-14 NOTE — ED Notes (Signed)
MD MILLER at bedside. 

## 2015-03-14 NOTE — Telephone Encounter (Signed)
CALLED PATIENT TO INFORM THAT FU NEEDS TO BE MOVED DUE TO DR. KINARD BEING IN A PROCEDURE, RESCHEDULED FOR 03-15-15 @ 3 PM, LVM STATING THIS, REQUESTED A RETURN CALL

## 2015-03-14 NOTE — ED Provider Notes (Signed)
The patient is a 69 year old male, he was recently diagnosed with esophageal carcinoma, has been treated so far with 2 doses of chemotherapy most recently last week. The family reports that over the last several days he has had decreased oral intake, his caloric replacement has been mainly with boost drinks, he has had a follow-up visit with his family doctor today where they noted that he was hypotensive, sent him to the emergency department. The patient does complain of having a sore throat and swallowing but states that this has been a chronic problem for the last couple of months and is not necessarily any different today. He denies fevers, shortness of breath, coughing, abdominal pain, swelling, weakness, numbness or headache.  On exam the patient is borderline tachycardic with a pulse of 100, borderline hypotensive with a blood pressure of 95 systolic, he has clear lung sounds, clear heart sounds, no distress, clear oropharynx with moist mucous membranes. He has an abdominal exam significant only for minimal diffuse tenderness, nonfocal, no guarding, no peritoneal signs. No swelling or edema of the 4 extremities, slight decrease in pulses at the radial arteries but they are palpable. He is speaking in full sentences and following commands without difficulty.  The patient will need orthostatics, labs to evaluate for the source of the hypotension including infection, check lactic acid, orthostatics and fluid resuscitation. The patient and family members have been informed of the plan and they are in agreement.  Medical screening examination/treatment/procedure(s) were conducted as a shared visit with non-physician practitioner(s) and myself.  I personally evaluated the patient during the encounter.  Clinical Impression:   Final diagnoses:  Dehydration  Malignant neoplasm of esophagus, unspecified location [facility])         Noemi Chapel, MD 03/15/15 1526

## 2015-03-14 NOTE — ED Notes (Signed)
Pt aware of needed urine specimen.

## 2015-03-15 ENCOUNTER — Ambulatory Visit
Admission: RE | Admit: 2015-03-15 | Discharge: 2015-03-15 | Disposition: A | Discharge status: Home / Self Care | Payer: Medicare Other | Source: Ambulatory Visit | Attending: Radiation Oncology | Admitting: Radiation Oncology

## 2015-03-19 LAB — CULTURE, BLOOD (ROUTINE X 2)
CULTURE: NO GROWTH
Culture: NO GROWTH

## 2015-03-21 ENCOUNTER — Encounter: Payer: Self-pay | Admitting: Radiation Oncology

## 2015-03-21 ENCOUNTER — Ambulatory Visit
Admission: RE | Admit: 2015-03-21 | Discharge: 2015-03-21 | Disposition: A | Discharge status: Home / Self Care | Payer: Medicare Other | Source: Ambulatory Visit | Attending: Radiation Oncology | Admitting: Radiation Oncology

## 2015-03-21 ENCOUNTER — Ambulatory Visit
Admit: 2015-03-21 | Discharge: 2015-03-21 | Disposition: A | Discharge status: Home / Self Care | Payer: Medicare Other | Source: Ambulatory Visit | Attending: Radiation Oncology | Admitting: Radiation Oncology

## 2015-03-21 ENCOUNTER — Telehealth: Payer: Self-pay | Admitting: Oncology

## 2015-03-21 ENCOUNTER — Telehealth: Payer: Self-pay | Admitting: Nurse Practitioner

## 2015-03-21 VITALS — BP 106/58 | HR 116 | Temp 100.9°F | Resp 18 | Ht 68.0 in | Wt 121.2 lb

## 2015-03-21 DIAGNOSIS — R109 Unspecified abdominal pain: Secondary | ICD-10-CM | POA: Diagnosis not present

## 2015-03-21 DIAGNOSIS — Z8673 Personal history of transient ischemic attack (TIA), and cerebral infarction without residual deficits: Secondary | ICD-10-CM | POA: Diagnosis not present

## 2015-03-21 DIAGNOSIS — C159 Malignant neoplasm of esophagus, unspecified: Secondary | ICD-10-CM

## 2015-03-21 DIAGNOSIS — Z681 Body mass index (BMI) 19 or less, adult: Secondary | ICD-10-CM | POA: Diagnosis not present

## 2015-03-21 DIAGNOSIS — Z7982 Long term (current) use of aspirin: Secondary | ICD-10-CM | POA: Diagnosis not present

## 2015-03-21 DIAGNOSIS — Z8249 Family history of ischemic heart disease and other diseases of the circulatory system: Secondary | ICD-10-CM | POA: Diagnosis not present

## 2015-03-21 DIAGNOSIS — J449 Chronic obstructive pulmonary disease, unspecified: Secondary | ICD-10-CM | POA: Diagnosis not present

## 2015-03-21 DIAGNOSIS — D630 Anemia in neoplastic disease: Secondary | ICD-10-CM | POA: Diagnosis not present

## 2015-03-21 DIAGNOSIS — E43 Unspecified severe protein-calorie malnutrition: Secondary | ICD-10-CM | POA: Diagnosis not present

## 2015-03-21 DIAGNOSIS — I10 Essential (primary) hypertension: Secondary | ICD-10-CM | POA: Diagnosis not present

## 2015-03-21 DIAGNOSIS — R1310 Dysphagia, unspecified: Secondary | ICD-10-CM | POA: Diagnosis not present

## 2015-03-21 DIAGNOSIS — Z87891 Personal history of nicotine dependence: Secondary | ICD-10-CM | POA: Diagnosis not present

## 2015-03-21 DIAGNOSIS — C155 Malignant neoplasm of lower third of esophagus: Secondary | ICD-10-CM | POA: Diagnosis not present

## 2015-03-21 DIAGNOSIS — G40909 Epilepsy, unspecified, not intractable, without status epilepticus: Secondary | ICD-10-CM | POA: Diagnosis not present

## 2015-03-21 DIAGNOSIS — Z8501 Personal history of malignant neoplasm of esophagus: Secondary | ICD-10-CM | POA: Diagnosis not present

## 2015-03-21 DIAGNOSIS — E871 Hypo-osmolality and hyponatremia: Secondary | ICD-10-CM | POA: Diagnosis not present

## 2015-03-21 DIAGNOSIS — J189 Pneumonia, unspecified organism: Secondary | ICD-10-CM | POA: Diagnosis not present

## 2015-03-21 DIAGNOSIS — Z79899 Other long term (current) drug therapy: Secondary | ICD-10-CM | POA: Diagnosis not present

## 2015-03-21 DIAGNOSIS — D709 Neutropenia, unspecified: Secondary | ICD-10-CM | POA: Diagnosis not present

## 2015-03-21 DIAGNOSIS — I2510 Atherosclerotic heart disease of native coronary artery without angina pectoris: Secondary | ICD-10-CM | POA: Diagnosis not present

## 2015-03-21 DIAGNOSIS — K5900 Constipation, unspecified: Secondary | ICD-10-CM | POA: Diagnosis not present

## 2015-03-21 DIAGNOSIS — T451X5A Adverse effect of antineoplastic and immunosuppressive drugs, initial encounter: Secondary | ICD-10-CM | POA: Diagnosis not present

## 2015-03-21 DIAGNOSIS — R5081 Fever presenting with conditions classified elsewhere: Secondary | ICD-10-CM | POA: Diagnosis not present

## 2015-03-21 DIAGNOSIS — E872 Acidosis: Secondary | ICD-10-CM | POA: Diagnosis not present

## 2015-03-21 DIAGNOSIS — C7951 Secondary malignant neoplasm of bone: Secondary | ICD-10-CM | POA: Diagnosis not present

## 2015-03-21 DIAGNOSIS — R509 Fever, unspecified: Secondary | ICD-10-CM | POA: Diagnosis not present

## 2015-03-21 DIAGNOSIS — D6481 Anemia due to antineoplastic chemotherapy: Secondary | ICD-10-CM | POA: Diagnosis not present

## 2015-03-21 DIAGNOSIS — Z951 Presence of aortocoronary bypass graft: Secondary | ICD-10-CM | POA: Diagnosis not present

## 2015-03-21 DIAGNOSIS — D899 Disorder involving the immune mechanism, unspecified: Secondary | ICD-10-CM | POA: Diagnosis not present

## 2015-03-21 LAB — CBC WITH DIFFERENTIAL/PLATELET
BASO%: 0.6 % (ref 0.0–2.0)
BASOS ABS: 0 10*3/uL (ref 0.0–0.1)
EOS%: 0.1 % (ref 0.0–7.0)
Eosinophils Absolute: 0 10*3/uL (ref 0.0–0.5)
HEMATOCRIT: 31 % — AB (ref 38.4–49.9)
HEMOGLOBIN: 10.2 g/dL — AB (ref 13.0–17.1)
LYMPH#: 0.3 10*3/uL — AB (ref 0.9–3.3)
LYMPH%: 13 % — ABNORMAL LOW (ref 14.0–49.0)
MCH: 28.6 pg (ref 27.2–33.4)
MCHC: 32.9 g/dL (ref 32.0–36.0)
MCV: 87 fL (ref 79.3–98.0)
MONO#: 1.2 10*3/uL — ABNORMAL HIGH (ref 0.1–0.9)
MONO%: 46.9 % — AB (ref 0.0–14.0)
NEUT#: 1 10*3/uL — ABNORMAL LOW (ref 1.5–6.5)
NEUT%: 39.4 % (ref 39.0–75.0)
Platelets: 250 10*3/uL (ref 140–400)
RBC: 3.56 10*6/uL — ABNORMAL LOW (ref 4.20–5.82)
RDW: 14 % (ref 11.0–14.6)
WBC: 2.5 10*3/uL — ABNORMAL LOW (ref 4.0–10.3)

## 2015-03-21 LAB — COMPREHENSIVE METABOLIC PANEL (CC13)
ALBUMIN: 2.1 g/dL — AB (ref 3.5–5.0)
ALK PHOS: 201 U/L — AB (ref 40–150)
ALT: 24 U/L (ref 0–55)
AST: 34 U/L (ref 5–34)
Anion Gap: 9 mEq/L (ref 3–11)
BUN: 10.9 mg/dL (ref 7.0–26.0)
CO2: 29 mEq/L (ref 22–29)
Calcium: 9.1 mg/dL (ref 8.4–10.4)
Chloride: 97 mEq/L — ABNORMAL LOW (ref 98–109)
Creatinine: 0.7 mg/dL (ref 0.7–1.3)
EGFR: >90 mL/min/{1.73_m2} (ref >90)
GLUCOSE: 135 mg/dL (ref 70–140)
POTASSIUM: 4.3 meq/L (ref 3.5–5.1)
SODIUM: 135 meq/L — AB (ref 136–145)
Total Bilirubin: 0.68 mg/dL (ref 0.20–1.20)
Total Protein: 7.9 g/dL (ref 6.4–8.3)

## 2015-03-21 NOTE — Progress Notes (Signed)
Radiation Oncology         [phone] ________________________________  Name: Andre Guzman MRN: [id number]  Date: 03/21/2015  DOB: 1946/03/09  Follow-Up Visit Note    CC: Maximino Greenland, MD  Truitt Merle, MD   Diagnosis:   Stage IV esophageal cancer Indication for treatment: Esophageal narrowing and dysphagia   Interval Since Last Radiation:  5 weeks, 01/25/2015-02/13/2015  Narrative:  The patient returns today for routine follow-up.  He denies having any pain. He reports that he can eat what he wants and does not have trouble swallowing. He reports that he likes to eat [employer] sandwiches, greens and is drinking 3 cans of boost per day. He reports having an occasional cough.with white sputum. His temperature was elevated today at 100.9 degrees. Orthostatic vitals done: BP sitting 119/74, hr 108, bp standing 106/58, hr 116. He has stopped taking all bp medications. He reports having fatigue. He has hyperpigmentation on his chest. Denies dysuria or hemoptysis.                                ALLERGIES:  has No Known Allergies.  Meds: Current Outpatient Prescriptions  Medication Sig Dispense Refill  . aspirin EC 81 MG tablet Take 81 mg by mouth daily.    Marland Kitchen lidocaine-prilocaine (EMLA) cream Apply to affected area once (Patient taking differently: Apply 1 application topically daily as needed (For port-a-cath.). Apply to affected area once) 30 g 3  . mirtazapine (REMERON) 15 MG tablet Take 15 mg by mouth at bedtime.     Marland Kitchen morphine (ROXANOL) 20 MG/ML concentrated solution Take 0.25 mLs (5 mg total) by mouth every 6 (six) hours as needed for severe pain. 30 mL 0  . phenytoin (DILANTIN) 100 MG ER capsule Take 100-200 mg by mouth 2 (two) times daily. He takes one capsule in the morning and two capsules at bedtime.    . Polyvinyl Alcohol-Povidone (REFRESH OP) Place 1 drop into both eyes daily as needed (dry eyes).    . pravastatin (PRAVACHOL) 40 MG tablet Take 40 mg by mouth every evening.       . terazosin (HYTRIN) 10 MG capsule Take 10 mg by mouth at bedtime.   0  . albuterol (PROVENTIL HFA;VENTOLIN HFA) 108 (90 BASE) MCG/ACT inhaler Inhale 1-2 puffs into the lungs every 6 (six) hours as needed for wheezing or shortness of breath.    . isosorbide mononitrate (IMDUR) 120 MG 24 hr tablet Take 1 tablet (120 mg total) by mouth daily. (Patient not taking: Reported on 03/21/2015) 30 tablet 2  . ondansetron (ZOFRAN) 8 MG tablet Take 1 tablet (8 mg total) by mouth 2 (two) times daily. Start the day after chemo for 2 days. Then take as needed for nausea or vomiting. (Patient not taking: Reported on 03/21/2015) 30 tablet 1  . PRESCRIPTION MEDICATION He receives his chemo treatments at the [facility] with Dr. Burr Medico. He is on a 14 day cycle of FOLFOX. His last dose was on 03/08/15.    Marland Kitchen sucralfate (CARAFATE) 1 G tablet Take 1 tablet (1 g total) by mouth 4 (four) times daily -  with meals and at bedtime. (Patient not taking: Reported on 03/14/2015) 50 tablet 1  . triamterene-hydrochlorothiazide (MAXZIDE-25) 37.5-25 MG per tablet Take 1 tablet by mouth daily. (Patient not taking: Reported on 03/14/2015) 30 tablet 2   No current facility-administered medications for this encounter.  Facility-Administered Medications Ordered in Other Encounters  Medication Dose Route Frequency Provider Last Rate Last Dose  . 0.9 %  sodium chloride infusion   Intravenous Once Truitt Merle, MD        Physical Findings: The patient is in no acute distress. Patient is alert and oriented.  height is 5\' 8"  (1.727 m) and weight is 121 lb 3.2 oz (54.976 kg). His oral temperature is 100.9 F (38.3 C). His blood pressure is 106/58 and his pulse is 116. His respiration is 18 and oxygen saturation is 100%. Marland Kitchen  .Heart has regular rate and rhythm. No palpable cervical, supraclavicular, or axillary adenopathy.  Lungs: Occasional mild wheezing along the right side heard.   Lab Findings: Lab Results   Component Value Date   WBC 8.5 03/14/2015   HGB 9.3* 03/14/2015   HCT 26.9* 03/14/2015   MCV 84.6 03/14/2015   PLT 335 03/14/2015    Radiographic Findings: Dg Chest 2 View  03/14/2015  CLINICAL DATA:  Hypertension. EXAM: CHEST  2 VIEW COMPARISON:  December 07, 2014. FINDINGS: The heart size and mediastinal contours are within normal limits. Both lungs are clear. No pneumothorax or pleural effusion is noted. Sternotomy wires are noted. Right internal jugular Port-A-Cath is noted with distal tip in the expected position of the cavoatrial junction. The visualized skeletal structures are unremarkable. IMPRESSION: No active cardiopulmonary disease. Electronically Signed   By: Marijo Conception, M.D.   On: 03/14/2015 15:25    Impression:  The patient is recovering from the effects of radiation. Swallowing well and can eat anything he wishes.   Plan:  Ordered a CBC today to further investigate his temperature elevation. PRN follow-up in radiation oncology. Patient will be seen by medical oncology tomorrow. He is tentatively scheduled for additional chemotherapy Thursday but given his temperature and blood indices this may be postponed. The patient may require IV fluid supplementation and possibly antibiotics.  I discussed this with the patient today but he does not wish to remain for fluids given the late hour.  Medical oncology contacted about patient's situation this afternoon.  -----------------------------------  Blair Promise, PhD, MD   This document serves as a record of services personally performed by Gery Pray, MD. It was created on his behalf by Arlyce Harman, a trained medical scribe. The creation of this record is based on the scribe's personal observations and the provider's statements to them. This document has been checked and approved by the attending provider.

## 2015-03-21 NOTE — Telephone Encounter (Signed)
Called Ned Card to notify her of Devynn's elevated temperature of 100.9 degrees today.  Also notified her that Dr. Sondra Come ordered labs today. Lattie Haw said she will relay results to Dr. Burr Medico.

## 2015-03-21 NOTE — Telephone Encounter (Signed)
I was notified by radiation oncology that Mr. Andre Guzman had a temperature of 100.9 while in their office earlier today for routine follow-up. Labs showed an absolute count of 1.0. I called Mr. Andre Guzman to follow-up on the earlier temperature. He has not checked his temperature since returning home. He has a thermometer but is unable to read it. He will have his wife check when she gets home. He denies any shaking chills. No shortness of breath or cough. No urinary symptoms. No diarrhea. He understands that Dr. Burr Guzman recommends he go to the emergency department for evaluation if he becomes febrile during the night or develops any worrisome symptoms. Otherwise we will see him as scheduled 03/22/2015.

## 2015-03-21 NOTE — Progress Notes (Addendum)
Andre Guzman here for follow up.  He denies having any pain.  He reports that he can eat what he wants and does not have trouble swallowing.  He reports that he likes to eat [employer] sandwiches, greens and is drinking 3 cans of boost per day.  He reports having an occasional cough.with white sputum.  His temperature was elevated today at 100.9 degrees.  Orthostatic vitals done: BP sitting 119/74, hr 108, bp standing 106/58, hr 116.  He has stopped taking all bp medications.  He reports having fatigue.  He has hyperpigmentation on his chest.  BP 106/58 mmHg  Pulse 116  Temp(Src) 100.9 F (38.3 C) (Oral)  Resp 18  Ht 5\' 8"  (1.727 m)  Wt 121 lb 3.2 oz (54.976 kg)  BMI 18.43 kg/m2  SpO2 100%   Wt Readings from Last 3 Encounters:  03/21/15 121 lb 3.2 oz (54.976 kg)  03/10/15 121 lb (54.885 kg)  03/08/15 127 lb 6.4 oz (57.788 kg)

## 2015-03-22 ENCOUNTER — Ambulatory Visit (HOSPITAL_BASED_OUTPATIENT_CLINIC_OR_DEPARTMENT_OTHER): Payer: Medicare Other

## 2015-03-22 ENCOUNTER — Encounter (HOSPITAL_COMMUNITY): Payer: Self-pay | Admitting: Emergency Medicine

## 2015-03-22 ENCOUNTER — Other Ambulatory Visit: Payer: Medicare Other

## 2015-03-22 ENCOUNTER — Other Ambulatory Visit: Payer: Self-pay | Admitting: Nurse Practitioner

## 2015-03-22 ENCOUNTER — Ambulatory Visit: Payer: Medicare Other | Admitting: Nutrition

## 2015-03-22 ENCOUNTER — Emergency Department (HOSPITAL_COMMUNITY): Payer: Medicare Other

## 2015-03-22 ENCOUNTER — Ambulatory Visit (HOSPITAL_BASED_OUTPATIENT_CLINIC_OR_DEPARTMENT_OTHER): Payer: Medicare Other | Admitting: Nurse Practitioner

## 2015-03-22 ENCOUNTER — Inpatient Hospital Stay (HOSPITAL_COMMUNITY)
Admission: EM | Admit: 2015-03-22 | Discharge: 2015-03-26 | DRG: 864 | Disposition: A | Discharge status: Home / Self Care | Payer: Medicare Other | Attending: Internal Medicine | Admitting: Internal Medicine

## 2015-03-22 ENCOUNTER — Ambulatory Visit (HOSPITAL_COMMUNITY)
Admission: RE | Admit: 2015-03-22 | Discharge: 2015-03-22 | Disposition: A | Discharge status: Home / Self Care | Payer: Medicare Other | Source: Ambulatory Visit | Attending: Nurse Practitioner | Admitting: Nurse Practitioner

## 2015-03-22 VITALS — BP 103/53 | HR 95 | Temp 99.5°F | Resp 18

## 2015-03-22 VITALS — BP 98/59 | HR 116 | Temp 100.4°F | Resp 16 | Ht 68.0 in | Wt 121.9 lb

## 2015-03-22 DIAGNOSIS — E872 Acidosis: Secondary | ICD-10-CM | POA: Diagnosis present

## 2015-03-22 DIAGNOSIS — Z951 Presence of aortocoronary bypass graft: Secondary | ICD-10-CM

## 2015-03-22 DIAGNOSIS — D6481 Anemia due to antineoplastic chemotherapy: Secondary | ICD-10-CM | POA: Diagnosis present

## 2015-03-22 DIAGNOSIS — C7951 Secondary malignant neoplasm of bone: Secondary | ICD-10-CM

## 2015-03-22 DIAGNOSIS — Z79899 Other long term (current) drug therapy: Secondary | ICD-10-CM

## 2015-03-22 DIAGNOSIS — Z8673 Personal history of transient ischemic attack (TIA), and cerebral infarction without residual deficits: Secondary | ICD-10-CM

## 2015-03-22 DIAGNOSIS — Z7982 Long term (current) use of aspirin: Secondary | ICD-10-CM

## 2015-03-22 DIAGNOSIS — Z87891 Personal history of nicotine dependence: Secondary | ICD-10-CM

## 2015-03-22 DIAGNOSIS — Z8501 Personal history of malignant neoplasm of esophagus: Secondary | ICD-10-CM | POA: Diagnosis not present

## 2015-03-22 DIAGNOSIS — R5081 Fever presenting with conditions classified elsewhere: Secondary | ICD-10-CM

## 2015-03-22 DIAGNOSIS — C155 Malignant neoplasm of lower third of esophagus: Secondary | ICD-10-CM

## 2015-03-22 DIAGNOSIS — J449 Chronic obstructive pulmonary disease, unspecified: Secondary | ICD-10-CM | POA: Diagnosis present

## 2015-03-22 DIAGNOSIS — R1310 Dysphagia, unspecified: Secondary | ICD-10-CM

## 2015-03-22 DIAGNOSIS — D899 Disorder involving the immune mechanism, unspecified: Secondary | ICD-10-CM | POA: Diagnosis present

## 2015-03-22 DIAGNOSIS — E43 Unspecified severe protein-calorie malnutrition: Secondary | ICD-10-CM | POA: Diagnosis present

## 2015-03-22 DIAGNOSIS — K5900 Constipation, unspecified: Secondary | ICD-10-CM | POA: Diagnosis present

## 2015-03-22 DIAGNOSIS — G40909 Epilepsy, unspecified, not intractable, without status epilepticus: Secondary | ICD-10-CM

## 2015-03-22 DIAGNOSIS — Z681 Body mass index (BMI) 19 or less, adult: Secondary | ICD-10-CM

## 2015-03-22 DIAGNOSIS — I10 Essential (primary) hypertension: Secondary | ICD-10-CM | POA: Diagnosis present

## 2015-03-22 DIAGNOSIS — C159 Malignant neoplasm of esophagus, unspecified: Secondary | ICD-10-CM

## 2015-03-22 DIAGNOSIS — D709 Neutropenia, unspecified: Secondary | ICD-10-CM

## 2015-03-22 DIAGNOSIS — D649 Anemia, unspecified: Secondary | ICD-10-CM | POA: Diagnosis present

## 2015-03-22 DIAGNOSIS — R509 Fever, unspecified: Secondary | ICD-10-CM | POA: Diagnosis not present

## 2015-03-22 DIAGNOSIS — T451X5A Adverse effect of antineoplastic and immunosuppressive drugs, initial encounter: Secondary | ICD-10-CM | POA: Diagnosis present

## 2015-03-22 DIAGNOSIS — Z8249 Family history of ischemic heart disease and other diseases of the circulatory system: Secondary | ICD-10-CM

## 2015-03-22 DIAGNOSIS — E871 Hypo-osmolality and hyponatremia: Secondary | ICD-10-CM | POA: Diagnosis present

## 2015-03-22 DIAGNOSIS — I2510 Atherosclerotic heart disease of native coronary artery without angina pectoris: Secondary | ICD-10-CM | POA: Diagnosis present

## 2015-03-22 DIAGNOSIS — R636 Underweight: Secondary | ICD-10-CM | POA: Diagnosis present

## 2015-03-22 LAB — COMPREHENSIVE METABOLIC PANEL
ALBUMIN: 2.2 g/dL — AB (ref 3.5–5.0)
ALT: 27 U/L (ref 17–63)
ANION GAP: 9 (ref 5–15)
AST: 48 U/L — AB (ref 15–41)
Alkaline Phosphatase: 153 U/L — ABNORMAL HIGH (ref 38–126)
BILIRUBIN TOTAL: 0.6 mg/dL (ref 0.3–1.2)
BUN: 11 mg/dL (ref 6–20)
CO2: 26 mmol/L (ref 22–32)
Calcium: 7.9 mg/dL — ABNORMAL LOW (ref 8.9–10.3)
Chloride: 97 mmol/L — ABNORMAL LOW (ref 101–111)
Creatinine, Ser: 0.61 mg/dL (ref 0.61–1.24)
GFR calc Af Amer: >60 mL/min (ref >60)
GFR calc non Af Amer: >60 mL/min (ref >60)
GLUCOSE: 132 mg/dL — AB (ref 65–99)
POTASSIUM: 3.6 mmol/L (ref 3.5–5.1)
SODIUM: 132 mmol/L — AB (ref 135–145)
Total Protein: 6.8 g/dL (ref 6.5–8.1)

## 2015-03-22 LAB — URINALYSIS, MICROSCOPIC - CHCC
BILIRUBIN (URINE): NEGATIVE
Blood: NEGATIVE
Glucose: NEGATIVE mg/dL
KETONES: NEGATIVE mg/dL
Leukocyte Esterase: NEGATIVE
NITRITE: NEGATIVE
PH: 6 (ref 4.6–8.0)
Specific Gravity, Urine: 1.02 (ref 1.003–1.035)
Urobilinogen, UR: 0.2 mg/dL (ref 0.2–1)

## 2015-03-22 LAB — CBC WITH DIFFERENTIAL/PLATELET
BASO%: 0.5 % (ref 0.0–2.0)
BASOS ABS: 0 10*3/uL (ref 0.0–0.1)
Basophils Absolute: 0 10*3/uL (ref 0.0–0.1)
Basophils Relative: 0 %
EOS PCT: 0 %
EOS%: 0.1 % (ref 0.0–7.0)
Eosinophils Absolute: 0 10*3/uL (ref 0.0–0.5)
Eosinophils Absolute: 0 10*3/uL (ref 0.0–0.7)
HEMATOCRIT: 26.1 % — AB (ref 38.4–49.9)
HEMATOCRIT: 22.3 % — AB (ref 39.0–52.0)
HEMOGLOBIN: 8.7 g/dL — AB (ref 13.0–17.1)
Hemoglobin: 7.7 g/dL — ABNORMAL LOW (ref 13.0–17.0)
LYMPH%: 11.2 % — ABNORMAL LOW (ref 14.0–49.0)
LYMPHS ABS: 0.5 10*3/uL — AB (ref 0.7–4.0)
LYMPHS PCT: 11 %
MCH: 28.7 pg (ref 27.2–33.4)
MCH: 28.7 pg (ref 26.0–34.0)
MCHC: 33.2 g/dL (ref 32.0–36.0)
MCHC: 34.5 g/dL (ref 30.0–36.0)
MCV: 86.4 fL (ref 79.3–98.0)
MCV: 83.2 fL (ref 78.0–100.0)
MONO ABS: 1.4 10*3/uL — AB (ref 0.1–1.0)
MONO#: 1.2 10*3/uL — ABNORMAL HIGH (ref 0.1–0.9)
MONO%: 33.2 % — ABNORMAL HIGH (ref 0.0–14.0)
MONOS PCT: 31 %
NEUT%: 55 % (ref 39.0–75.0)
NEUTROS ABS: 2 10*3/uL (ref 1.5–6.5)
NEUTROS ABS: 2.7 10*3/uL (ref 1.7–7.7)
Neutrophils Relative %: 58 %
PLATELETS: 300 10*3/uL (ref 140–400)
PLATELETS: 262 10*3/uL (ref 150–400)
RBC: 3.02 10*6/uL — ABNORMAL LOW (ref 4.20–5.82)
RBC: 2.68 MIL/uL — ABNORMAL LOW (ref 4.22–5.81)
RDW: 14.2 % (ref 11.0–14.6)
RDW: 13.8 % (ref 11.5–15.5)
WBC: 3.7 10*3/uL — ABNORMAL LOW (ref 4.0–10.3)
WBC: 4.7 10*3/uL (ref 4.0–10.5)
lymph#: 0.4 10*3/uL — ABNORMAL LOW (ref 0.9–3.3)

## 2015-03-22 LAB — BASIC METABOLIC PANEL (CC13)
Anion Gap: 10 mEq/L (ref 3–11)
BUN: 9 mg/dL (ref 7.0–26.0)
CALCIUM: 8.4 mg/dL (ref 8.4–10.4)
CO2: 27 meq/L (ref 22–29)
CREATININE: 0.6 mg/dL — AB (ref 0.7–1.3)
Chloride: 95 mEq/L — ABNORMAL LOW (ref 98–109)
Glucose: 147 mg/dl — ABNORMAL HIGH (ref 70–140)
Potassium: 3.5 mEq/L (ref 3.5–5.1)
SODIUM: 132 meq/L — AB (ref 136–145)

## 2015-03-22 LAB — I-STAT CG4 LACTIC ACID, ED: Lactic Acid, Venous: 2.15 mmol/L (ref 0.5–2.0)

## 2015-03-22 MED ORDER — ACETAMINOPHEN 500 MG PO TABS
500.0000 mg | ORAL_TABLET | Freq: Once | ORAL | Status: AC
  Administered 2015-03-23: 500 mg via ORAL
  Filled 2015-03-22: qty 1

## 2015-03-22 MED ORDER — SODIUM CHLORIDE 0.9 % IV BOLUS (SEPSIS)
1000.0000 mL | INTRAVENOUS | Status: AC
  Administered 2015-03-22: 1000 mL via INTRAVENOUS

## 2015-03-22 MED ORDER — PIPERACILLIN-TAZOBACTAM 3.375 G IVPB 30 MIN
3.3750 g | Freq: Once | INTRAVENOUS | Status: AC
  Administered 2015-03-22: 3.375 g via INTRAVENOUS
  Filled 2015-03-22: qty 50

## 2015-03-22 MED ORDER — VANCOMYCIN HCL IN DEXTROSE 1-5 GM/200ML-% IV SOLN
1000.0000 mg | Freq: Once | INTRAVENOUS | Status: AC
  Administered 2015-03-23: 1000 mg via INTRAVENOUS
  Filled 2015-03-22: qty 200

## 2015-03-22 MED ORDER — HEPARIN SOD (PORK) LOCK FLUSH 100 UNIT/ML IV SOLN
500.0000 [IU] | Freq: Once | INTRAVENOUS | Status: AC
  Administered 2015-03-22: 500 [IU] via INTRAVENOUS
  Filled 2015-03-22: qty 5

## 2015-03-22 MED ORDER — SODIUM CHLORIDE 0.9 % IV SOLN
1500.0000 mL | Freq: Once | INTRAVENOUS | Status: AC
  Administered 2015-03-22: 1500 mL via INTRAVENOUS

## 2015-03-22 MED ORDER — SODIUM CHLORIDE 0.9 % IJ SOLN
10.0000 mL | INTRAMUSCULAR | Status: DC | PRN
  Administered 2015-03-22: 10 mL via INTRAVENOUS
  Filled 2015-03-22: qty 10

## 2015-03-22 MED ORDER — LEVOFLOXACIN 500 MG PO TABS: 500.0000 mg | ORAL_TABLET | Freq: Every day | ORAL | Status: DC

## 2015-03-22 MED ORDER — LEVOFLOXACIN 500 MG PO TABS
500.0000 mg | ORAL_TABLET | Freq: Once | ORAL | Status: AC
  Administered 2015-03-22: 500 mg via ORAL
  Filled 2015-03-22: qty 1

## 2015-03-22 NOTE — Progress Notes (Signed)
ANTIBIOTIC CONSULT NOTE - INITIAL  Pharmacy Consult for Zosyn/Vancomycin Indication: Sepsis  No Known Allergies  Patient Measurements:   Wt=55 kg  Vital Signs: Temp: 100.3 F (37.9 C) (11/03 2150) Temp Source: Oral (11/03 2150) BP: 121/59 mmHg (11/03 2150) Pulse Rate: 111 (11/03 2150) Intake/Output from previous day:   Intake/Output from this shift:    Labs:  Recent Labs  03/21/15 1447 03/22/15 1331 03/22/15 1335 03/22/15 2229  WBC 2.5* 3.7*  --  4.7  HGB 10.2* 8.7*  --  7.7*  PLT 250 300  --  262  CREATININE 0.7  --  0.6* 0.61   Estimated Creatinine Clearance: 67.2 mL/min (by C-G formula based on Cr of 0.61). No results for input(s): VANCOTROUGH, VANCOPEAK, VANCORANDOM, GENTTROUGH, GENTPEAK, GENTRANDOM, TOBRATROUGH, TOBRAPEAK, TOBRARND, AMIKACINPEAK, AMIKACINTROU, AMIKACIN in the last 72 hours.   Microbiology: Recent Results (from the past 720 hour(s))  Culture, blood (routine x 2)     Status: None   Collection Time: 03/14/15  2:10 PM  Result Value Ref Range Status   Specimen Description BLOOD PORTA CATH  Final   Special Requests BOTTLES DRAWN AEROBIC AND ANAEROBIC 5 CC EA  Final   Culture   Final    NO GROWTH 5 DAYS Performed at [facility]    Report Status 03/19/2015 FINAL  Final  Culture, blood (routine x 2)     Status: None   Collection Time: 03/14/15  2:27 PM  Result Value Ref Range Status   Specimen Description BLOOD LEFT ANTECUBITAL  Final   Special Requests BOTTLES DRAWN AEROBIC AND ANAEROBIC 5ML  Final   Culture   Final    NO GROWTH 5 DAYS Performed at [facility]    Report Status 03/19/2015 FINAL  Final    Medical History: Past Medical History  Diagnosis Date  . Coronary artery disease   . Shortness of breath   . Seizures ([place])   . Hypertension   . Pneumonia   . COPD (chronic obstructive pulmonary disease) ([place])   . Stroke ([place])   . Esophageal cancer ([place])   . Radiation 01/25/15-02/13/15    35 gray for esophageal cancer     Medications:   (Not in a hospital admission) Scheduled:   Infusions:  . piperacillin-tazobactam    . sodium chloride    . vancomycin     Assessment: 56 yoM with stage IV esophageal Ca admitted with fever.  Zosyn/Vancomycin per Rx for Sepsis.   Goal of Therapy:  Vancomycin trough level 15-20 mcg/ml  Plan:   Zosyn 3.375 Gm IV q8h EI  Vancomycin 1Gm x1 then 750mg  IV q12h  F/u SCr/cultures/levels  Lawana Pai R 03/22/2015,11:12 PM

## 2015-03-22 NOTE — Patient Instructions (Addendum)
Pick-up Levaquin antibiotic. It has been sent to the [facility] on [address]. You already had 1 dose today at the [facility]. Start taking medication at home tomorrow, November 4. Plan to come back to the [facility] to see [facility] tomorrow as scheduled above.   Levofloxacin tablets What is this medicine? LEVOFLOXACIN (lee voe FLOX a sin) is a quinolone antibiotic. It is used to treat certain kinds of bacterial infections. It will not work for colds, flu, or other viral infections. This medicine may be used for other purposes; ask your health care provider or pharmacist if you have questions. What should I tell my health care provider before I take this medicine? They need to know if you have any of these conditions: -bone problems -cerebral disease -history of low levels of potassium in the blood -irregular heartbeat -joint problems -kidney disease -myasthenia gravis -seizures -tendon problems -tingling of the fingers or toes, or other nerve disorder -an unusual or allergic reaction to levofloxacin, other quinolone antibiotics, foods, dyes, or preservatives -pregnant or trying to get pregnant -breast-feeding How should I use this medicine? Take this medicine by mouth with a full glass of water. Follow the directions on the prescription label. This medicine can be taken with or without food. Take your medicine at regular intervals. Do not take your medicine more often than directed. Do not skip doses or stop your medicine early even if you feel better. Do not stop taking except on your doctor's advice. A special MedGuide will be given to you by the pharmacist with each prescription and refill. Be sure to read this information carefully each time. Talk to your pediatrician regarding the use of this medicine in children. While this drug may be prescribed for children as young as 6 months for selected conditions, precautions do apply. Overdosage: If you think you have taken too  much of this medicine contact a poison control center or emergency room at once. NOTE: This medicine is only for you. Do not share this medicine with others. What if I miss a dose? If you miss a dose, take it as soon as you remember. If it is almost time for your next dose, take only that dose. Do not take double or extra doses. What may interact with this medicine? Do not take this medicine with any of the following medications: -arsenic trioxide -chloroquine -droperidol -medicines for irregular heart rhythm like amiodarone, disopyramide, dofetilide, flecainide, quinidine, procainamide, sotalol -some medicines for depression or mental problems like phenothiazines, pimozide, and ziprasidone This medicine may also interact with the following medications: -amoxapine -antacids -birth control pills -cisapride -dairy products -didanosine (ddI) buffered tablets or powder -haloperidol -multivitamins -NSAIDS, medicines for pain and inflammation, like ibuprofen or naproxen -retinoid products like tretinoin or isotretinoin -risperidone -some other antibiotics like clarithromycin or erythromycin -sucralfate -theophylline -warfarin This list may not describe all possible interactions. Give your health care provider a list of all the medicines, herbs, non-prescription drugs, or dietary supplements you use. Also tell them if you smoke, drink alcohol, or use illegal drugs. Some items may interact with your medicine. What should I watch for while using this medicine? Tell your doctor or health care professional if your symptoms do not improve or if they get worse. Drink several glasses of water a day and cut down on drinks that contain caffeine. You must not get dehydrated while taking this medicine. You may get drowsy or dizzy. Do not drive, use machinery, or do anything that needs mental alertness  until you know how this medicine affects you. Do not sit or stand up quickly, especially if you are an  older patient. This reduces the risk of dizzy or fainting spells. This medicine can make you more sensitive to the sun. Keep out of the sun. If you cannot avoid being in the sun, wear protective clothing and use a sunscreen. Do not use sun lamps or tanning beds/booths. Contact your doctor if you get a sunburn. If you are a diabetic monitor your blood glucose carefully. If you get an unusual reading stop taking this medicine and call your doctor right away. Do not treat diarrhea with over-the-counter products. Contact your doctor if you have diarrhea that lasts more than 2 days or if the diarrhea is severe and watery. Avoid antacids, calcium, iron, and zinc products for 2 hours before and 2 hours after taking a dose of this medicine. What side effects may I notice from receiving this medicine? Side effects that you should report to your doctor or health care professional as soon as possible: -allergic reactions like skin rash or hives, swelling of the face, lips, or tongue -anxious -confusion -depressed mood -diarrhea -fast, irregular heartbeat -hallucination, loss of contact with reality -joint, muscle, or tendon pain or swelling -pain, tingling, numbness in the hands or feet -suicidal thoughts or other mood changes -sunburn -unusually weak or tired Side effects that usually do not require medical attention (report to your doctor or health care professional if they continue or are bothersome): -dry mouth -headache -nausea -trouble sleeping This list may not describe all possible side effects. Call your doctor for medical advice about side effects. You may report side effects to FDA at 1-800-FDA-1088. Where should I keep my medicine? Keep out of the reach of children. Store at room temperature between 15 and 30 degrees C (59 and 86 degrees F). Keep in a tightly closed container. Throw away any unused medicine after the expiration date. NOTE: This sheet is a summary. It may not cover all  possible information. If you have questions about this medicine, talk to your doctor, pharmacist, or health care provider.    2016, [employer]vier/Gold Standard. (2014-12-14 12:40:18)

## 2015-03-22 NOTE — ED Provider Notes (Signed)
CSN: [id number]     Arrival date & time 03/22/15  2144 History  By signing my name below, I, Randa Evens, attest that this documentation has been prepared under the direction and in the presence of Sharlett Iles, MD. Electronically Signed: Randa Evens, ED Scribe. 03/22/2015. 11:24 PM.    Chief Complaint  Patient presents with  . Fever    Patient is a 69 y.o. male presenting with fever. The history is provided by the patient. No language interpreter was used.  Fever  HPI Comments: Andre Guzman is a 69 y.o. male with PMHX listed below who presents to the Emergency Department complaining of new fever (max temp 100 PTA) onset today after receiving chemotherapy. Pt states that he has been feeling general malaise for the past 3 days. Pt denies any recent sick contacts. Pt has had tylenol with no relief. Pt denies cough, sore throat, n/v/d, abdominal pain, dysuria, CP, new SOB or other related symptoms.   Review of chart shows that patient had labs done earlier today at chemo and was given fluids, PO levaquin and discharged home w/ close follow up.  Past Medical History  Diagnosis Date  . Coronary artery disease   . Shortness of breath   . Seizures ([place])   . Hypertension   . Pneumonia   . COPD (chronic obstructive pulmonary disease) ([place])   . Stroke ([place])   . 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 Radiation 01/25/15-02/13/15    35 gray for esophageal cancer   Past Surgical History  Procedure Laterality Date  . Cardiac catheterization    . Coronary angioplasty with stent placement  07/22/2011  . Cardiac valve surgery    . Coronary artery bypass graft    . Lower extremity angiogram N/A 07/01/2011    Procedure: LOWER EXTREMITY ANGIOGRAM;  Surgeon: Laverda Page, MD;  Location: [facility] CATH LAB;  Service: Cardiovascular;  Laterality: N/A;  . Left heart catheterization with coronary angiogram N/A 07/01/2011    Procedure: LEFT HEART CATHETERIZATION WITH CORONARY ANGIOGRAM;  Surgeon:  Laverda Page, MD;  Location: [facility] CATH LAB;  Service: Cardiovascular;  Laterality: N/A;  . Percutaneous coronary rotoblator intervention (pci-r) N/A 07/22/2011    Procedure: PERCUTANEOUS CORONARY ROTOBLATOR INTERVENTION (PCI-R);  Surgeon: Laverda Page, MD;  Location: [facility] CATH LAB;  Service: Cardiovascular;  Laterality: N/A;  . Left heart catheterization with coronary angiogram N/A 10/30/2011    Procedure: LEFT HEART CATHETERIZATION WITH CORONARY ANGIOGRAM;  Surgeon: Laverda Page, MD;  Location: [facility] CATH LAB;  Service: Cardiovascular;  Laterality: N/A;  . Esophagogastroduodenoscopy  01/02/15   Family History  Problem Relation Age of Onset  . Heart disease Mother   . Hypertension Mother   . Heart disease Sister   . Hypertension Sister   . Heart disease Brother   . Hypertension Daughter    Social History  Substance Use Topics  . Smoking status: Former Smoker -- 0.50 packs/day for 50 years    Types: Cigarettes    Quit date: 12/27/2014  . Smokeless tobacco: Never Used  . Alcohol Use: 0.0 oz/week    0 Standard drinks or equivalent per week     Comment: occasional beer, 1-2 per week, used to drink daily heavily     Review of Systems  Constitutional: Positive for fever.  10 Systems reviewed and all are negative for acute change except as noted in the HPI.   Allergies  Review of patient's allergies indicates no known allergies.  Home Medications  Prior to Admission medications   Medication Sig Start Date End Date Taking? Authorizing Provider  albuterol (PROVENTIL HFA;VENTOLIN HFA) 108 (90 BASE) MCG/ACT inhaler Inhale 1-2 puffs into the lungs every 6 (six) hours as needed for wheezing or shortness of breath.    Historical Provider, MD  aspirin EC 81 MG tablet Take 81 mg by mouth daily.    Historical Provider, MD  isosorbide mononitrate (IMDUR) 120 MG 24 hr tablet Take 1 tablet (120 mg total) by mouth daily. Patient not taking: Reported on 03/21/2015 11/13/13   Adrian Prows, MD   levofloxacin (LEVAQUIN) 500 MG tablet Take 1 tablet (500 mg total) by mouth daily. 03/22/15   Owens Shark, NP  lidocaine-prilocaine (EMLA) cream Apply to affected area once Patient taking differently: Apply 1 application topically daily as needed (For port-a-cath.). Apply to affected area once 02/19/15   Truitt Merle, MD  mirtazapine (REMERON) 15 MG tablet Take 15 mg by mouth at bedtime.  03/14/15   Historical Provider, MD  morphine (ROXANOL) 20 MG/ML concentrated solution Take 0.25 mLs (5 mg total) by mouth every 6 (six) hours as needed for severe pain. 03/08/15   Truitt Merle, MD  ondansetron (ZOFRAN) 8 MG tablet Take 1 tablet (8 mg total) by mouth 2 (two) times daily. Start the day after chemo for 2 days. Then take as needed for nausea or vomiting. Patient not taking: Reported on 03/21/2015 02/19/15   Truitt Merle, MD  phenytoin (DILANTIN) 100 MG ER capsule Take 100-200 mg by mouth 2 (two) times daily. He takes one capsule in the morning and two capsules at bedtime.    Historical Provider, MD  Polyvinyl Alcohol-Povidone (REFRESH OP) Place 1 drop into both eyes daily as needed (dry eyes).    Historical Provider, MD  pravastatin (PRAVACHOL) 40 MG tablet Take 40 mg by mouth every evening.  01/08/15   Historical Provider, MD  PRESCRIPTION MEDICATION He receives his chemo treatments at the [facility] with Dr. Burr Medico. He is on a 14 day cycle of FOLFOX. His last dose was on 03/08/15.    Historical Provider, MD  sucralfate (CARAFATE) 1 G tablet Take 1 tablet (1 g total) by mouth 4 (four) times daily -  with meals and at bedtime. Patient not taking: Reported on 03/14/2015 01/30/15   Gery Pray, MD  terazosin (HYTRIN) 10 MG capsule Take 10 mg by mouth at bedtime.  01/19/15   Historical Provider, MD  triamterene-hydrochlorothiazide (MAXZIDE-25) 37.5-25 MG per tablet Take 1 tablet by mouth daily. Patient not taking: Reported on 03/14/2015 11/13/13   Adrian Prows, MD    BP 121/59 mmHg  Pulse 111   Temp(Src) 100.3 F (37.9 C) (Oral)  Resp 20  SpO2 100%    Physical Exam  Constitutional: He is oriented to person, place, and time. He appears well-developed and well-nourished. No distress.  Thin elderly gentlemen in no acute distress.   HENT:  Head: Normocephalic and atraumatic.  Mouth/Throat: Oropharynx is clear and moist.  Moist mucous membranes  Eyes: Conjunctivae are normal. Pupils are equal, round, and reactive to light.  Neck: Neck supple.  Cardiovascular: Normal rate, regular rhythm and normal heart sounds.   No murmur heard. Pulmonary/Chest: Effort normal.  Coarse breath sounds bil right greater than left   Abdominal: Soft. Bowel sounds are normal. He exhibits no distension. There is no tenderness.  Musculoskeletal: He exhibits no edema.  Neurological: He is alert and oriented to person, place, and time.  Fluent speech  Skin: Skin is warm and dry. No rash noted.  Psychiatric: He has a normal mood and affect. Judgment normal.  Nursing note and vitals reviewed.   ED Course  .Critical Care Performed by: Sharlett Iles Authorized by: Sharlett Iles Total critical care time: 30 minutes Critical care time was exclusive of separately billable procedures and treating other patients. Critical care was necessary to treat or prevent imminent or life-threatening deterioration of the following conditions: sepsis. Critical care was time spent personally by me on the following activities: development of treatment plan with patient or surrogate, evaluation of patient's response to treatment, examination of patient, obtaining history from patient or surrogate, ordering and performing treatments and interventions, ordering and review of laboratory studies, ordering and review of radiographic studies, re-evaluation of patient's condition and review of old charts.   (including critical care time) DIAGNOSTIC STUDIES: Oxygen Saturation is 100% on RA, normal by my  interpretation.    COORDINATION OF CARE: 11:22 PM-Discussed treatment plan with pt at bedside and pt/family agreed to plan.    Labs Review Labs Reviewed  COMPREHENSIVE METABOLIC PANEL - Abnormal; Notable for the following:    Sodium 132 (*)    Chloride 97 (*)    Glucose, Bld 132 (*)    Calcium 7.9 (*)    Albumin 2.2 (*)    AST 48 (*)    Alkaline Phosphatase 153 (*)    All other components within normal limits  CBC WITH DIFFERENTIAL/PLATELET - Abnormal; Notable for the following:    RBC 2.68 (*)    Hemoglobin 7.7 (*)    HCT 22.3 (*)    Lymphs Abs 0.5 (*)    Monocytes Absolute 1.4 (*)    All other components within normal limits  I-STAT CG4 LACTIC ACID, ED - Abnormal; Notable for the following:    Lactic Acid, Venous 2.15 (*)    All other components within normal limits  CULTURE, BLOOD (ROUTINE X 2)  CULTURE, BLOOD (ROUTINE X 2)  URINE CULTURE  URINALYSIS, ROUTINE W REFLEX MICROSCOPIC (NOT AT [facility])    Imaging Review Dg Chest 2 View  03/22/2015  CLINICAL DATA:  Fever after chemotherapy treatment today. EXAM: CHEST  2 VIEW COMPARISON:  03/22/2015 FINDINGS: There is a right jugular central line with tip at the cavoatrial junction. The lungs are clear. The pulmonary vasculature is normal. There are no pleural effusions. Hilar and mediastinal contours are unremarkable unchanged. IMPRESSION: No active cardiopulmonary disease. Electronically Signed   By: Andreas Newport M.D.   On: 03/22/2015 22:24   Dg Chest 2 View  03/22/2015  CLINICAL DATA:  Esophageal cancer history. EXAM: CHEST  2 VIEW COMPARISON:  03/14/2015. FINDINGS: Power Port catheter in stable position. Mediastinum hilar structures are normal. Prior median sternotomy. Heart size stable. Lungs are clear. No pleural effusion or pneumothorax. Small metallic pellet in stable position over the right chest. IMPRESSION: 1. PowerPort catheter stable position. 2. No acute cardiopulmonary disease. Prior median sternotomy. Chest is  stable from prior exam. Electronically Signed   By: Marcello Moores  Register   On: 03/22/2015 14:50   I have personally reviewed and evaluated these lab results as part of my medical decision-making.   EKG Interpretation None      MDM   Final diagnoses:  Fever, unspecified fever cause  immunosuppression Lactic acidosis  69 year old male on chemotherapy for esophageal cancer who presents with fever that began earlier today and has persisted despite receiving Tylenol and Levaquin earlier. On exam, patient awake, alert, quiet  but in no acute distress. Initial vital signs notable for fever of 100.9, tachycardia at 108. Normal work of breathing and coarse breath sounds but no obvious crackles. No focal signs of infection on exam. Because of the patient's immunosuppression, initiated sepsis protocol with labs including lactate, blood and urine cultures and broad-spectrum antibiotics with vancomycin and Zosyn. Gave the patient another dose of Tylenol. Chest x-ray was negative for acute infiltrate. Labs notable for lactate of 2.15, mildly worsened anemia at hemoglobin 7.7. The remainder of his labs were stable. Discussed patient with the hospitalist and he was admitted to general medicine for further treatment.   I personally performed the services described in this documentation, which was scribed in my presence. The recorded information has been reviewed and is accurate.     Sharlett Iles, MD 03/23/15 [phone]

## 2015-03-22 NOTE — Progress Notes (Signed)
Pt in stable condition at time of d/c. Pt and family notified to go to ED if needed over night and to come to clinic for appt with Selena Lesser NP on 03/23/15 at 0900.

## 2015-03-22 NOTE — ED Notes (Signed)
Bed: WA03 Expected date:  Expected time:  Means of arrival:  Comments: Tr 3

## 2015-03-22 NOTE — ED Notes (Addendum)
Patient with cancer, was seen today for chemo. Patient began running a fever, was given antibiotics and then was sent home. Patient states tonight he has began to have a fever again tonight. Patient took 1 tylenol at home at @1900 .

## 2015-03-22 NOTE — Progress Notes (Signed)
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 Benson Norway showed a large, fungating mass with no bleeding and with stigmata met of recent bleeding was found in the third of the esophagus. The mass was completely obstructing and circumferential. The stomach and examined duodenum were normal.   01/02/2015 Imaging CT CAP showed 9 cm segment of irregular his social worker thickening. Single enlarged subcarinal lymph node, bilateral pulmonary nodules (3), 50m right middle lobe next to right atrium, 3 RUL and a 7 mm lingular lung nodules.    01/02/2015 Initial Biopsy Esophageal mass biopsy showed poorly differentiated squamous cell carcinoma.   01/18/2015 Imaging PET scan showed hypermetabolic esophageal mass, subcarinal and possible right hilar adenopathy, several lung nodules, and a hypermetabolic bone lesion in right ilium, compatible with metastatic disease.    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 Andre Guzman for follow-up. He completed cycle 2 FOLFOX 03/08/2015. He denies nausea/vomiting. No mouth sores. No diarrhea. No numbness or tingling in his hands or feet. He was found to have a temperature of 100.9 in radiation oncology yesterday. Absolute neutrophil count returned at 1.0. Temperature during the night was 100.2. No shaking chills. No shortness of breath or cough. No dysuria. No diarrhea. No dysphagia. Appetite  varies.  Objective:  Vital signs in last 24 hours:  Blood pressure 98/59, pulse 116, temperature 100.4 F (38 C), temperature source Oral, resp. rate 16, height _0  (1.727 m), weight 121 lb 14.4 oz (55.293 kg), SpO2 100 %.    HEENT: No thrush or ulcers. Mouth appears dry. Resp: Lungs clear bilaterally. Cardio: Regular, tachycardic. GI: Abdomen soft and nontender. No hepatomegaly. Vascular: No leg edema. Neuro: Alert, oriented. At times slow to respond to questions.  Skin: No rash. Port-A-Cath nontender without erythema.    Lab Results:  Lab Results  Component Value Date   WBC 2.5* 03/21/2015   HGB 10.2* 03/21/2015   HCT 31.0* 03/21/2015   MCV 87.0 03/21/2015   PLT 250 03/21/2015   NEUTROABS 1.0* 03/21/2015    Imaging:  No results found.  Medications: I have reviewed the patient's current medications.  Assessment/Plan: 1.  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 FOLFOX initiated 02/22/2015. Cycle 2 FOLFOX 03/08/2015. 2. Dysphagia and weight loss secondary to #1. He is no longer experiencing dysphagia. Weight is stable. 3. Hypertension, CAD, history of seizure and CVA 4. Chest pain secondary to #1 and radiation. Resolved.   Disposition: Mr. Andre Guzman completed 2 cycles of FOLFOX. He has a low-grade fever and mild neutropenia. He is likely dehydrated as well. We discussed hospitalization. He and his wife prefer to try to manage as an outpatient. We will obtain a chest x-ray, blood cultures and a urine culture today. He will begin Levaquin 500 mg daily. He will receive 1.5 L of IV fluids. He will return for a follow-up visit with Andre Guzman nurse practitioner on 03/23/2015.  We discussed that he should go to the emergency department for evaluation if his fever increases, he develops shaking chills or other worrisome symptoms.  Patient seen with Dr. Burr Medico.    Ned Card ANP/GNP-BC    03/22/2015  1:06 PM

## 2015-03-22 NOTE — Progress Notes (Signed)
Spoke with patient's wife who reports patient eats what he wants. Patient gets angry when pushed to eat more often. Weight is relatively stable at 121 pounds. Patient continues to drink boost 3 times a day  Nutrition diagnosis: Unintended weight loss continues.  Intervention:  Recommended patient continue boost 3 times a day with increased oral intake of foods at mealtimes and snacks. Provided supportive listening Teach back method used.  Monitoring, evaluation, goals: Patient will tolerate increased calories and protein to minimize weight loss.  Next visit: Thursday, November 17, during infusion.

## 2015-03-23 ENCOUNTER — Encounter: Payer: Medicare Other | Admitting: Nurse Practitioner

## 2015-03-23 ENCOUNTER — Encounter (HOSPITAL_COMMUNITY): Payer: Self-pay | Admitting: Internal Medicine

## 2015-03-23 DIAGNOSIS — I10 Essential (primary) hypertension: Secondary | ICD-10-CM | POA: Diagnosis not present

## 2015-03-23 DIAGNOSIS — Z79899 Other long term (current) drug therapy: Secondary | ICD-10-CM | POA: Diagnosis not present

## 2015-03-23 DIAGNOSIS — K5900 Constipation, unspecified: Secondary | ICD-10-CM | POA: Diagnosis present

## 2015-03-23 DIAGNOSIS — D649 Anemia, unspecified: Secondary | ICD-10-CM | POA: Diagnosis present

## 2015-03-23 DIAGNOSIS — R509 Fever, unspecified: Secondary | ICD-10-CM | POA: Diagnosis not present

## 2015-03-23 DIAGNOSIS — E871 Hypo-osmolality and hyponatremia: Secondary | ICD-10-CM | POA: Diagnosis present

## 2015-03-23 DIAGNOSIS — D899 Disorder involving the immune mechanism, unspecified: Secondary | ICD-10-CM | POA: Diagnosis present

## 2015-03-23 DIAGNOSIS — T451X5A Adverse effect of antineoplastic and immunosuppressive drugs, initial encounter: Secondary | ICD-10-CM | POA: Diagnosis present

## 2015-03-23 DIAGNOSIS — G40909 Epilepsy, unspecified, not intractable, without status epilepticus: Secondary | ICD-10-CM | POA: Diagnosis present

## 2015-03-23 DIAGNOSIS — Z951 Presence of aortocoronary bypass graft: Secondary | ICD-10-CM | POA: Diagnosis not present

## 2015-03-23 DIAGNOSIS — Z8249 Family history of ischemic heart disease and other diseases of the circulatory system: Secondary | ICD-10-CM | POA: Diagnosis not present

## 2015-03-23 DIAGNOSIS — Z8673 Personal history of transient ischemic attack (TIA), and cerebral infarction without residual deficits: Secondary | ICD-10-CM | POA: Diagnosis not present

## 2015-03-23 DIAGNOSIS — Z7982 Long term (current) use of aspirin: Secondary | ICD-10-CM | POA: Diagnosis not present

## 2015-03-23 DIAGNOSIS — J449 Chronic obstructive pulmonary disease, unspecified: Secondary | ICD-10-CM | POA: Diagnosis present

## 2015-03-23 DIAGNOSIS — C159 Malignant neoplasm of esophagus, unspecified: Secondary | ICD-10-CM | POA: Diagnosis not present

## 2015-03-23 DIAGNOSIS — I2510 Atherosclerotic heart disease of native coronary artery without angina pectoris: Secondary | ICD-10-CM | POA: Diagnosis not present

## 2015-03-23 DIAGNOSIS — D6481 Anemia due to antineoplastic chemotherapy: Secondary | ICD-10-CM | POA: Diagnosis present

## 2015-03-23 DIAGNOSIS — E43 Unspecified severe protein-calorie malnutrition: Secondary | ICD-10-CM | POA: Diagnosis present

## 2015-03-23 DIAGNOSIS — D630 Anemia in neoplastic disease: Secondary | ICD-10-CM | POA: Diagnosis not present

## 2015-03-23 DIAGNOSIS — Z87891 Personal history of nicotine dependence: Secondary | ICD-10-CM | POA: Diagnosis not present

## 2015-03-23 DIAGNOSIS — Z681 Body mass index (BMI) 19 or less, adult: Secondary | ICD-10-CM | POA: Diagnosis not present

## 2015-03-23 DIAGNOSIS — E872 Acidosis: Secondary | ICD-10-CM | POA: Diagnosis present

## 2015-03-23 LAB — TYPE AND SCREEN
ABO/RH(D): B NEG
Antibody Screen: NEGATIVE

## 2015-03-23 LAB — CBC WITH DIFFERENTIAL/PLATELET
BASOS ABS: 0 10*3/uL (ref 0.0–0.1)
BASOS PCT: 0 %
EOS ABS: 0 10*3/uL (ref 0.0–0.7)
EOS PCT: 1 %
HCT: 28.2 % — ABNORMAL LOW (ref 39.0–52.0)
Hemoglobin: 9.7 g/dL — ABNORMAL LOW (ref 13.0–17.0)
Lymphocytes Relative: 11 %
Lymphs Abs: 0.4 10*3/uL — ABNORMAL LOW (ref 0.7–4.0)
MCH: 28.8 pg (ref 26.0–34.0)
MCHC: 34.4 g/dL (ref 30.0–36.0)
MCV: 83.7 fL (ref 78.0–100.0)
Monocytes Absolute: 0.7 10*3/uL (ref 0.1–1.0)
Monocytes Relative: 19 %
Neutro Abs: 2.5 10*3/uL (ref 1.7–7.7)
Neutrophils Relative %: 69 %
PLATELETS: 248 10*3/uL (ref 150–400)
RBC: 3.37 MIL/uL — AB (ref 4.22–5.81)
RDW: 13.9 % (ref 11.5–15.5)
WBC: 3.6 10*3/uL — AB (ref 4.0–10.5)

## 2015-03-23 LAB — URINE MICROSCOPIC-ADD ON

## 2015-03-23 LAB — URINALYSIS, ROUTINE W REFLEX MICROSCOPIC
Bilirubin Urine: NEGATIVE
GLUCOSE, UA: NEGATIVE mg/dL
KETONES UR: NEGATIVE mg/dL
Leukocytes, UA: NEGATIVE
NITRITE: NEGATIVE
PROTEIN: NEGATIVE mg/dL
Specific Gravity, Urine: 1.014 (ref 1.005–1.030)
Urobilinogen, UA: 1 mg/dL (ref 0.0–1.0)
pH: 5 (ref 5.0–8.0)

## 2015-03-23 LAB — COMPREHENSIVE METABOLIC PANEL
ALT: 23 U/L (ref 17–63)
AST: 37 U/L (ref 15–41)
Albumin: 1.9 g/dL — ABNORMAL LOW (ref 3.5–5.0)
Alkaline Phosphatase: 150 U/L — ABNORMAL HIGH (ref 38–126)
Anion gap: 9 (ref 5–15)
BILIRUBIN TOTAL: 0.7 mg/dL (ref 0.3–1.2)
BUN: 9 mg/dL (ref 6–20)
CALCIUM: 7.8 mg/dL — AB (ref 8.9–10.3)
CO2: 26 mmol/L (ref 22–32)
CREATININE: 0.39 mg/dL — AB (ref 0.61–1.24)
Chloride: 99 mmol/L — ABNORMAL LOW (ref 101–111)
GFR calc Af Amer: >60 mL/min (ref >60)
Glucose, Bld: 119 mg/dL — ABNORMAL HIGH (ref 65–99)
Potassium: 3 mmol/L — ABNORMAL LOW (ref 3.5–5.1)
Sodium: 134 mmol/L — ABNORMAL LOW (ref 135–145)
TOTAL PROTEIN: 6.4 g/dL — AB (ref 6.5–8.1)

## 2015-03-23 LAB — ABO/RH: ABO/RH(D): B NEG

## 2015-03-23 LAB — PHENYTOIN LEVEL, TOTAL: PHENYTOIN LVL: 12 ug/mL (ref 10.0–20.0)

## 2015-03-23 LAB — PHOSPHORUS: Phosphorus: 2.7 mg/dL (ref 2.5–4.6)

## 2015-03-23 LAB — URINE CULTURE

## 2015-03-23 LAB — MAGNESIUM: MAGNESIUM: 1.7 mg/dL (ref 1.7–2.4)

## 2015-03-23 MED ORDER — TERAZOSIN HCL 5 MG PO CAPS
10.0000 mg | ORAL_CAPSULE | Freq: Every day | ORAL | Status: DC
  Administered 2015-03-23 – 2015-03-25 (×3): 10 mg via ORAL
  Filled 2015-03-23 (×4): qty 2

## 2015-03-23 MED ORDER — PRAVASTATIN SODIUM 40 MG PO TABS
40.0000 mg | ORAL_TABLET | Freq: Every evening | ORAL | Status: DC
  Administered 2015-03-23 – 2015-03-25 (×3): 40 mg via ORAL
  Filled 2015-03-23 (×3): qty 1

## 2015-03-23 MED ORDER — BOOST / RESOURCE BREEZE PO LIQD
1.0000 | Freq: Three times a day (TID) | ORAL | Status: DC
  Administered 2015-03-23 – 2015-03-26 (×6): 1 via ORAL

## 2015-03-23 MED ORDER — PHENYTOIN SODIUM EXTENDED 100 MG PO CAPS
100.0000 mg | ORAL_CAPSULE | Freq: Every day | ORAL | Status: DC
Start: 2015-03-23 — End: 2015-03-26
  Administered 2015-03-23 – 2015-03-26 (×4): 100 mg via ORAL
  Filled 2015-03-23 (×4): qty 1

## 2015-03-23 MED ORDER — MIRTAZAPINE 15 MG PO TABS
15.0000 mg | ORAL_TABLET | Freq: Every day | ORAL | Status: DC
  Administered 2015-03-23 – 2015-03-25 (×3): 15 mg via ORAL
  Filled 2015-03-23 (×3): qty 1

## 2015-03-23 MED ORDER — PHENYTOIN SODIUM EXTENDED 100 MG PO CAPS
200.0000 mg | ORAL_CAPSULE | Freq: Every day | ORAL | Status: DC
  Administered 2015-03-23 – 2015-03-25 (×4): 200 mg via ORAL
  Filled 2015-03-23 (×5): qty 2

## 2015-03-23 MED ORDER — ASPIRIN EC 81 MG PO TBEC
81.0000 mg | DELAYED_RELEASE_TABLET | Freq: Every day | ORAL | Status: DC
Start: 2015-03-23 — End: 2015-03-26
  Administered 2015-03-23 – 2015-03-26 (×4): 81 mg via ORAL
  Filled 2015-03-23 (×4): qty 1

## 2015-03-23 MED ORDER — MORPHINE SULFATE (CONCENTRATE) 10 MG/0.5ML PO SOLN: 5.0000 mg | Freq: Four times a day (QID) | ORAL | Status: DC | PRN

## 2015-03-23 MED ORDER — POTASSIUM CHLORIDE CRYS ER 20 MEQ PO TBCR
40.0000 meq | EXTENDED_RELEASE_TABLET | Freq: Once | ORAL | Status: AC
  Administered 2015-03-23: 40 meq via ORAL
  Filled 2015-03-23: qty 2

## 2015-03-23 MED ORDER — ONDANSETRON HCL 4 MG PO TABS: 4.0000 mg | ORAL_TABLET | Freq: Four times a day (QID) | ORAL | Status: DC | PRN

## 2015-03-23 MED ORDER — PIPERACILLIN-TAZOBACTAM 3.375 G IVPB
3.3750 g | Freq: Three times a day (TID) | INTRAVENOUS | Status: DC
  Administered 2015-03-23 – 2015-03-26 (×10): 3.375 g via INTRAVENOUS
  Filled 2015-03-23 (×7): qty 50

## 2015-03-23 MED ORDER — SODIUM CHLORIDE 0.9 % IJ SOLN: 3.0000 mL | Freq: Two times a day (BID) | INTRAMUSCULAR | Status: DC

## 2015-03-23 MED ORDER — ONDANSETRON HCL 4 MG/2ML IJ SOLN: 4.0000 mg | Freq: Four times a day (QID) | INTRAMUSCULAR | Status: DC | PRN

## 2015-03-23 MED ORDER — ENOXAPARIN SODIUM 40 MG/0.4ML ~~LOC~~ SOLN
40.0000 mg | Freq: Every day | SUBCUTANEOUS | Status: DC
  Administered 2015-03-23 – 2015-03-26 (×4): 40 mg via SUBCUTANEOUS
  Filled 2015-03-23 (×4): qty 0.4

## 2015-03-23 MED ORDER — ACETAMINOPHEN 500 MG PO TABS: 500.0000 mg | ORAL_TABLET | Freq: Four times a day (QID) | ORAL | Status: DC | PRN

## 2015-03-23 MED ORDER — ALBUTEROL SULFATE (2.5 MG/3ML) 0.083% IN NEBU
2.5000 mg | INHALATION_SOLUTION | Freq: Four times a day (QID) | RESPIRATORY_TRACT | Status: DC
  Administered 2015-03-23: 2.5 mg via RESPIRATORY_TRACT
  Filled 2015-03-23: qty 3

## 2015-03-23 MED ORDER — POTASSIUM CHLORIDE IN NACL 20-0.9 MEQ/L-% IV SOLN
INTRAVENOUS | Status: DC
  Administered 2015-03-23: 04:00:00 via INTRAVENOUS
  Administered 2015-03-24: 75 mL/h via INTRAVENOUS
  Administered 2015-03-24 – 2015-03-25 (×3): via INTRAVENOUS
  Administered 2015-03-26: 75 mL/h via INTRAVENOUS
  Filled 2015-03-23 (×8): qty 1000

## 2015-03-23 MED ORDER — IPRATROPIUM BROMIDE 0.02 % IN SOLN
0.5000 mg | Freq: Four times a day (QID) | RESPIRATORY_TRACT | Status: DC
  Administered 2015-03-23: 0.5 mg via RESPIRATORY_TRACT
  Filled 2015-03-23: qty 2.5

## 2015-03-23 MED ORDER — IPRATROPIUM-ALBUTEROL 0.5-2.5 (3) MG/3ML IN SOLN: 3.0000 mL | Freq: Four times a day (QID) | RESPIRATORY_TRACT | Status: DC | PRN

## 2015-03-23 MED ORDER — SODIUM CHLORIDE 0.9 % IJ SOLN
10.0000 mL | INTRAMUSCULAR | Status: DC | PRN
  Administered 2015-03-23 – 2015-03-26 (×4): 10 mL
  Filled 2015-03-23 (×3): qty 40

## 2015-03-23 MED ORDER — VANCOMYCIN HCL IN DEXTROSE 750-5 MG/150ML-% IV SOLN
750.0000 mg | Freq: Two times a day (BID) | INTRAVENOUS | Status: DC
  Administered 2015-03-23 – 2015-03-26 (×6): 750 mg via INTRAVENOUS
  Filled 2015-03-23 (×7): qty 150

## 2015-03-23 NOTE — Progress Notes (Signed)
Initial Nutrition Assessment  DOCUMENTATION CODES:   Severe malnutrition in context of chronic illness  INTERVENTION:  Boost Breeze po TID, each supplement provides 250 kcal and 9 grams of protein  NUTRITION DIAGNOSIS:   Malnutrition related to cancer and cancer related treatments as evidenced by severe depletion of body fat, percent weight loss, severe depletion of muscle mass.   GOAL:   Patient will meet greater than or equal to 90% of their needs  MONITOR:   PO intake, Labs, I & O's, Supplement acceptance  REASON FOR ASSESSMENT:   Malnutrition Screening Tool    ASSESSMENT:   Andre Guzman is a 69 y.o. male with a past medical history of CAD, epilepsy, hypertension, COPD, esophageal cancer who comes to the emergency department due to having a 100F fever today while he was receiving chemotherapy. Per patient, he has been feeling chills and fatigue at home  Spoke with patient with Son at bedside. Patient states that his appetite has been poor, related to chemo treatments, and cancer.  No issues with SOB and eating. Pt drinks boost at home, will provide during stay.  Nutrition-Focused physical exam completed. Findings are severe fat depletion, severe muscle depletion, and no edema.   Asked pt if he would like any special foods like ice cream, dessert, etc, he said no.  No chewing or swallowing issues Medications and Labs reviewed.  Diet Order:  Diet Heart Room service appropriate?: Yes; Fluid consistency:: Thin  Skin:  Reviewed, no issues  Last BM:  03/21/2015  Height:   Ht Readings from Last 1 Encounters:  03/23/15 5\' 8"  (1.727 m)    Weight:   Wt Readings from Last 1 Encounters:  03/23/15 122 lb 12.7 oz (55.7 kg)    Ideal Body Weight:  70 kg  BMI:  Body mass index is 18.68 kg/(m^2).  Estimated Nutritional Needs:   Kcal:  2000 - 2200 calories  Protein:  75 - 96 grams  Fluid:  >/= 2L  EDUCATION NEEDS:   No education needs identified at this  time  Satira Anis. Kanon Novosel, MS, RD LDN After Hours/Weekend Pager [phone]

## 2015-03-23 NOTE — ED Notes (Signed)
Patient with urinal at bedside, reminded a specimen is needed.

## 2015-03-23 NOTE — H&P (Signed)
[facility] History and Physical  Andre Guzman:[id number] DOB: 1945/10/18 DOA: 03/22/2015  Referring physician: Edd Arbour, M.D. PCP: Maximino Greenland, MD   Chief Complaint: Fever.  HPI: Andre Guzman is a 69 y.o. male with a past medical history of CAD, epilepsy, hypertension, COPD, esophageal cancer who comes to the emergency department due to having a 100F fever today while he was receiving chemotherapy. Per patient, he has been feeling chills and fatigue at home. He has been having a whitish colored sputum productive cough, but denies earaches, rhinorrhea, sore throat or dyspnea. He denies typical or pleuritic chest pain, abdominal pain, nausea, emesis, diarrhea, melena, hematochezia, dysuria, but complains of constipation (the patient is some oral morphine at home). He denies travel history or any known sick contacts. He was discharged home on oral Levaquin, but returned to the hospital due to the persistence of the symptoms.  When seen in the ER, the patient was in NAD. He stated he was feeling better after treatment with IVF and IV antibiotics.    Review of Systems:  Constitutional: Positive Fevers, chills, weight loss,fatigue. No night sweats,   HEENT:  No headaches, Difficulty swallowing,Tooth/dental problems,Sore throat,  No sneezing, itching, ear ache, nasal congestion, post nasal drip,  Cardio-vascular:  No chest pain, Orthopnea, PND, swelling in lower extremities, anasarca, dizziness, palpitations  GI:  Positive for constipation and loss of appetite  No heartburn, indigestion, abdominal pain, nausea, vomiting, diarrhea, Resp:  No shortness of breath with exertion or at rest. No excess mucus, no productive cough, No non-productive cough, No coughing up of blood.No change in color of mucus.No wheezing.No chest wall deformity  Skin:  no rash or lesions.  GU:  no dysuria, change in color of urine, no urgency or frequency. No flank pain.  Musculoskeletal:    No joint pain or swelling. No decreased range of motion. No back pain.  Psych:  No change in mood or affect. No depression or anxiety. No memory loss.   Past Medical History  Diagnosis Date  . Coronary artery disease   . Shortness of breath   . Seizures ([place])   . Hypertension   . Pneumonia   . COPD (chronic obstructive pulmonary disease) ([place])   . Stroke ([place])   . Esophageal cancer ([place])   . Radiation 01/25/15-02/13/15    35 gray for esophageal cancer   Past Surgical History  Procedure Laterality Date  . Cardiac catheterization    . Coronary angioplasty with stent placement  07/22/2011  . Cardiac valve surgery    . Coronary artery bypass graft    . Lower extremity angiogram N/A 07/01/2011    Procedure: LOWER EXTREMITY ANGIOGRAM;  Surgeon: Laverda Page, MD;  Location: [facility] CATH LAB;  Service: Cardiovascular;  Laterality: N/A;  . Left heart catheterization with coronary angiogram N/A 07/01/2011    Procedure: LEFT HEART CATHETERIZATION WITH CORONARY ANGIOGRAM;  Surgeon: Laverda Page, MD;  Location: [facility] CATH LAB;  Service: Cardiovascular;  Laterality: N/A;  . Percutaneous coronary rotoblator intervention (pci-r) N/A 07/22/2011    Procedure: PERCUTANEOUS CORONARY ROTOBLATOR INTERVENTION (PCI-R);  Surgeon: Laverda Page, MD;  Location: [facility] CATH LAB;  Service: Cardiovascular;  Laterality: N/A;  . Left heart catheterization with coronary angiogram N/A 10/30/2011    Procedure: LEFT HEART CATHETERIZATION WITH CORONARY ANGIOGRAM;  Surgeon: Laverda Page, MD;  Location: [facility] CATH LAB;  Service: Cardiovascular;  Laterality: N/A;  . Esophagogastroduodenoscopy  01/02/15   Social History:  reports that he quit smoking  about 2 months ago. His smoking use included Cigarettes. He has a 25 pack-year smoking history. He has never used smokeless tobacco. He reports that he drinks alcohol. He reports that he does not use illicit drugs.  No Known Allergies  Family History  Problem Relation Age of  Onset  . Heart disease Mother   . Hypertension Mother   . Heart disease Sister   . Hypertension Sister   . Heart disease Brother   . Hypertension Daughter     Prior to Admission medications   Medication Sig Start Date End Date Taking? Authorizing Provider  acetaminophen (TYLENOL) 500 MG tablet Take 500 mg by mouth every 6 (six) hours as needed for fever.   Yes Historical Provider, MD  aspirin EC 81 MG tablet Take 81 mg by mouth daily.   Yes Historical Provider, MD  lidocaine-prilocaine (EMLA) cream Apply to affected area once Patient taking differently: Apply 1 application topically daily as needed (For port-a-cath.). Apply to affected area once 02/19/15  Yes Truitt Merle, MD  mirtazapine (REMERON) 15 MG tablet Take 15 mg by mouth at bedtime.  03/14/15  Yes Historical Provider, MD  morphine (ROXANOL) 20 MG/ML concentrated solution Take 0.25 mLs (5 mg total) by mouth every 6 (six) hours as needed for severe pain. 03/08/15  Yes Truitt Merle, MD  phenytoin (DILANTIN) 100 MG ER capsule Take 100-200 mg by mouth 2 (two) times daily. He takes one capsule in the morning and two capsules at bedtime.   Yes Historical Provider, MD  pravastatin (PRAVACHOL) 40 MG tablet Take 40 mg by mouth every evening.  01/08/15  Yes Historical Provider, MD  terazosin (HYTRIN) 10 MG capsule Take 10 mg by mouth at bedtime.  01/19/15  Yes Historical Provider, MD  albuterol (PROVENTIL HFA;VENTOLIN HFA) 108 (90 BASE) MCG/ACT inhaler Inhale 1-2 puffs into the lungs every 6 (six) hours as needed for wheezing or shortness of breath.    Historical Provider, MD  isosorbide mononitrate (IMDUR) 120 MG 24 hr tablet Take 1 tablet (120 mg total) by mouth daily. Patient not taking: Reported on 03/21/2015 11/13/13   Adrian Prows, MD  levofloxacin (LEVAQUIN) 500 MG tablet Take 1 tablet (500 mg total) by mouth daily. 03/22/15   Owens Shark, NP  ondansetron (ZOFRAN) 8 MG tablet Take 1 tablet (8 mg total) by mouth 2 (two) times daily. Start the day after  chemo for 2 days. Then take as needed for nausea or vomiting. Patient not taking: Reported on 03/21/2015 02/19/15   Truitt Merle, MD  PRESCRIPTION MEDICATION He receives his chemo treatments at the [facility] with Dr. Burr Medico. He is on a 14 day cycle of FOLFOX.    Historical Provider, MD  sucralfate (CARAFATE) 1 G tablet Take 1 tablet (1 g total) by mouth 4 (four) times daily -  with meals and at bedtime. Patient not taking: Reported on 03/14/2015 01/30/15   Gery Pray, MD  triamterene-hydrochlorothiazide (MAXZIDE-25) 37.5-25 MG per tablet Take 1 tablet by mouth daily. 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 Eyes: PERRL, normal lids, irises & conjunctiva ENT: grossly normal hearing, lips & oral mucosa are mildly dry. Neck: no LAD, masses or thyromegaly Cardiovascular: RRR, no m/r/g. No LE edema. Telemetry: SR, no arrhythmias  Respiratory: Mild rhonchi and wheezing bilaterally. Normal respiratory effort. Abdomen: soft, ntnd Skin: no rash or induration seen on limited exam Musculoskeletal: grossly normal tone BUE/BLE Psychiatric: grossly normal mood and affect, speech fluent and appropriate Neurologic: grossly non-focal.          Labs on Admission:  Basic Metabolic Panel:  Recent Labs Lab 03/21/15 1447 03/22/15 1335 03/22/15 2229  NA 135* 132* 132*  K 4.3 3.5 3.6  CL  --   --  97*  CO2 29 27 26   GLUCOSE 135 147* 132*  BUN 10.9 9.0 11  CREATININE 0.7 0.6* 0.61    CALCIUM 9.1 8.4 7.9*   Liver Function Tests:  Recent Labs Lab 03/21/15 1447 03/22/15 2229  AST 34 48*  ALT 24 27  ALKPHOS 201* 153*  BILITOT 0.68 0.6  PROT 7.9 6.8  ALBUMIN 2.1* 2.2*   CBC:  Recent Labs Lab 03/21/15 1447 03/22/15 1331 03/22/15 2229  WBC 2.5* 3.7* 4.7  NEUTROABS 1.0* 2.0 2.7  HGB 10.2* 8.7* 7.7*  HCT 31.0* 26.1* 22.3*  MCV 87.0 86.4 83.2  PLT 250 300 262    Radiological Exams on Admission: Dg Chest 2 View  03/22/2015  CLINICAL DATA:  Fever after chemotherapy treatment today. EXAM: CHEST  2 VIEW COMPARISON:  03/22/2015 FINDINGS: There is a right jugular central line with tip at the cavoatrial junction. The lungs are clear. The pulmonary vasculature is normal. There are no pleural effusions. Hilar and mediastinal contours are unremarkable unchanged. IMPRESSION: No active cardiopulmonary disease. Electronically Signed   By: Andreas Newport M.D.   On: 03/22/2015 22:24   Dg Chest 2 View  03/22/2015  CLINICAL DATA:  Esophageal cancer history. EXAM: CHEST  2 VIEW COMPARISON:  03/14/2015. FINDINGS: Power Port catheter in stable position. Mediastinum hilar structures are normal. Prior median sternotomy. Heart size stable. Lungs are clear. No pleural effusion or pneumothorax. Small metallic pellet in stable position over the right chest. IMPRESSION: 1. PowerPort catheter stable position. 2. No acute cardiopulmonary disease. Prior median sternotomy. Chest is stable from prior exam. Electronically Signed   By: Marcello Moores  Register   On: 03/22/2015 14:50     Assessment/Plan Principal Problem:   Fever Admit to telemetry for cardiac monitoring. Continue IV antibiotics. Follow-up blood cultures. Urine analysis and urine culture and sensitivity have not been yet done.  Active Problems:   CAD (coronary artery disease), native coronary artery As symptomatic at this time. Patient is not taking Imdur anymore.    Essential hypertension, benign Continue terazosin and  monitor blood pressure periodically.    Seizure disorder (HCC) Continue oral phenytoin. Check phenytoin blood level.    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    Anemia Patient has had a 2+ gram hemoglobin decrease in the past few days. No hematemesis, melena or hematochezia. Follow-up H&H closely.    Hyponatremia Gentle IV hydration. Follow-up level in the morning.    COPD Will start bronchodilators every 6 hours.   Code Status: Full code. DVT Prophylaxis: Lovenox SQ. Family Communication:  Disposition Plan: Admit to telemetry for IV  antibiotic therapy.  Time spent: Over 70 minutes were spent during the whole process of this admission including, but not limited to direct patient contact, chart review and admission orders.  Reubin Milan [facility] Pager [phone].

## 2015-03-23 NOTE — Progress Notes (Signed)
Patient is seen & examined. Please see today's H&P for the details. 69 y/o male with PMH of HTN, CAD h/o CABG, CVA, COPD, Esophageal CA is admitted with fever during chemotherapy. Patient is clinically stable. Plan of care as per H&P. ? Bacterial bronchitis. Patient has productive cough. Cont IV atx, deescalate in 24-48 hrs if culture negative  Ceirra Belli N

## 2015-03-24 DIAGNOSIS — I10 Essential (primary) hypertension: Secondary | ICD-10-CM

## 2015-03-24 DIAGNOSIS — R509 Fever, unspecified: Principal | ICD-10-CM

## 2015-03-24 DIAGNOSIS — G40909 Epilepsy, unspecified, not intractable, without status epilepticus: Secondary | ICD-10-CM

## 2015-03-24 DIAGNOSIS — E871 Hypo-osmolality and hyponatremia: Secondary | ICD-10-CM

## 2015-03-24 DIAGNOSIS — I2510 Atherosclerotic heart disease of native coronary artery without angina pectoris: Secondary | ICD-10-CM

## 2015-03-24 DIAGNOSIS — C159 Malignant neoplasm of esophagus, unspecified: Secondary | ICD-10-CM

## 2015-03-24 LAB — URINE CULTURE: CULTURE: NO GROWTH

## 2015-03-24 LAB — CBC
HEMATOCRIT: 23.9 % — AB (ref 39.0–52.0)
Hemoglobin: 8.2 g/dL — ABNORMAL LOW (ref 13.0–17.0)
MCH: 28.4 pg (ref 26.0–34.0)
MCHC: 34.3 g/dL (ref 30.0–36.0)
MCV: 82.7 fL (ref 78.0–100.0)
PLATELETS: 357 10*3/uL (ref 150–400)
RBC: 2.89 MIL/uL — ABNORMAL LOW (ref 4.22–5.81)
RDW: 14 % (ref 11.5–15.5)
WBC: 5.5 10*3/uL (ref 4.0–10.5)

## 2015-03-24 LAB — BASIC METABOLIC PANEL
ANION GAP: 6 (ref 5–15)
BUN: 7 mg/dL (ref 6–20)
CALCIUM: 7.8 mg/dL — AB (ref 8.9–10.3)
CO2: 28 mmol/L (ref 22–32)
CREATININE: 0.48 mg/dL — AB (ref 0.61–1.24)
Chloride: 101 mmol/L (ref 101–111)
GFR calc Af Amer: >60 mL/min (ref >60)
Glucose, Bld: 92 mg/dL (ref 65–99)
Potassium: 3.5 mmol/L (ref 3.5–5.1)
Sodium: 135 mmol/L (ref 135–145)

## 2015-03-24 NOTE — Progress Notes (Signed)
Progress Note   Andre Guzman KYH:[id number] DOB: [birth year] DOA: 03/22/2015 PCP: Maximino Greenland, MD   Brief Narrative:   Andre Guzman is an 69 y.o. male with a PMH of CAD D, epilepsy, hypertension, COPD and metastatic esophageal cancer diagnosed 01/02/15, status post palliative radiation and currently receiving FOLFOX palliative chemotherapy, last cycle given 03/08/15, who was admitted 03/23/15 with fever after failing outpatient treatment with Levaquin. WBC was 3.6 on admission. Chest x-ray was negative.  Assessment/Plan:   Principal Problem:   Fever in the setting of recent chemotherapy - Patient is not currently neutropenic. - Continue empiric IV antibiotics with vancomycin and Zosyn. - Follow-up blood and urine cultures.  Active Problems:   CAD (coronary artery disease), native coronary artery - Continue aspirin and statin. Imdur on hold.    Essential hypertension, benign - Maxide currently on hold.    Seizure disorder (HCC) - Continue Dilantin.    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 Status post palliative radiation therapy.    Anemia associated with anti-neoplastic chemotherapy - Hemoglobin 8.2. Transfuse for less than 7.    Hyponatremia - Resolved with IV fluids.    DVT Prophylaxis - Lovenox ordered.   Family Communication/Anticipated D/C date and plan/Code Status   Family Communication: No family currently at the bedside. Disposition Plan: Home when afebrile 48 hours if cultures negative. Anticipated D/C date:   48-72 hours. Code Status:     Code Status Orders        Start     Ordered   03/23/15 0314  Full code   Continuous     03/23/15 0313       IV Access:    Porta-Cath   Procedures and diagnostic studies:   Dg Chest 2 View  03/22/2015  CLINICAL DATA:  Fever after chemotherapy treatment today. EXAM: CHEST  2 VIEW COMPARISON:  03/22/2015 FINDINGS: There is a right jugular central line  with tip at the cavoatrial junction. The lungs are clear. The pulmonary vasculature is normal. There are no pleural effusions. Hilar and mediastinal contours are unremarkable unchanged. IMPRESSION: No active cardiopulmonary disease. Electronically Signed   By: Andreas Newport M.D.   On: 03/22/2015 22:24   Dg Chest 2 View  03/22/2015  CLINICAL DATA:  Esophageal cancer history. EXAM: CHEST  2 VIEW COMPARISON:  03/14/2015. FINDINGS: Power Port catheter in stable position. Mediastinum hilar structures are normal. Prior median sternotomy. Heart size stable. Lungs are clear. No pleural effusion or pneumothorax. Small metallic pellet in stable position over the right chest. IMPRESSION: 1. PowerPort catheter stable position. 2. No acute cardiopulmonary disease. Prior median sternotomy. Chest is stable from prior exam. Electronically Signed   By: Marcello Moores  Register   On: 03/22/2015 14:50     Medical Consultants:    None.  Anti-Infectives:   Anti-infectives    Start     Dose/Rate Route Frequency Ordered Stop   03/23/15 1200  vancomycin (VANCOCIN) IVPB 750 mg/150 ml premix     750 mg 150 mL/hr over 60 Minutes Intravenous Every 12 hours 03/23/15 0033     03/23/15 0800  piperacillin-tazobactam (ZOSYN) IVPB 3.375 g     3.375 g 12.5 mL/hr over 240 Minutes Intravenous Every 8 hours 03/23/15 0014     03/22/15 2315  piperacillin-tazobactam (ZOSYN) IVPB 3.375 g     3.375 g 100 mL/hr over 30 Minutes Intravenous  Once 03/22/15 2300 03/23/15 0029   03/22/15 2315  vancomycin (VANCOCIN) IVPB 1000  mg/200 mL premix     1,000 mg 200 mL/hr over 60 Minutes Intravenous  Once 03/22/15 2300 03/23/15 0151      Subjective:   Andre Guzman denies dyspnea but has a non productive cough.  No nausea or vomiting.  No diarrhea.  No dysuria.    Objective:    Filed Vitals:   03/23/15 0813 03/23/15 1351 03/23/15 2308 03/24/15 0526  BP:  105/64 114/59 117/70  Pulse:  89 91 87  Temp:  99 F (37.2 C) 99.7 F (37.6  C) 98.9 F (37.2 C)  TempSrc:  Oral Oral Oral  Resp:  20 20 20   Height:      Weight:      SpO2: 98% 100% 100% 100%    Intake/Output Summary (Last 24 hours) at 03/24/15 0902 Last data filed at 03/24/15 0600  Gross per 24 hour  Intake   2610 ml  Output   1550 ml  Net   1060 ml   Filed Weights   03/23/15 0146  Weight: 55.7 kg (122 lb 12.7 oz)    Exam: Gen:  NAD Cardiovascular:  RRR, No M/R/G Respiratory:  Lungs with faint rhonchi Gastrointestinal:  Abdomen soft, NT/ND, + BS Extremities:  No C/E/C   Data Reviewed:    Labs: Basic Metabolic Panel:  Recent Labs Lab 03/21/15 1447 03/22/15 1335  03/22/15 2229 03/23/15 0538 03/24/15 0555  NA 135* 132*  --  132* 134* 135  K 4.3 3.5  < > 3.6 3.0* 3.5  CL  --   --   --  97* 99* 101  CO2 29 27  --  26 26 28   GLUCOSE 135 147*  --  132* 119* 92  BUN 10.9 9.0  --  11 9 7   CREATININE 0.7 0.6*  --  0.61 0.39* 0.48*  CALCIUM 9.1 8.4  --  7.9* 7.8* 7.8*  MG  --   --   --   --  1.7  --   PHOS  --   --   --   --  2.7  --   < > = values in this interval not displayed. GFR Estimated Creatinine Clearance: 67.7 mL/min (by C-G formula based on Cr of 0.48). Liver Function Tests:  Recent Labs Lab 03/21/15 1447 03/22/15 2229 03/23/15 0538  AST 34 48* 37  ALT 24 27 23   ALKPHOS 201* 153* 150*  BILITOT 0.68 0.6 0.7  PROT 7.9 6.8 6.4*  ALBUMIN 2.1* 2.2* 1.9*   CBC:  Recent Labs Lab 03/21/15 1447 03/22/15 1331 03/22/15 2229 03/23/15 0538 03/24/15 0555  WBC 2.5* 3.7* 4.7 3.6* 5.5  NEUTROABS 1.0* 2.0 2.7 2.5  --   HGB 10.2* 8.7* 7.7* 9.7* 8.2*  HCT 31.0* 26.1* 22.3* 28.2* 23.9*  MCV 87.0 86.4 83.2 83.7 82.7  PLT 250 300 262 248 357   Sepsis Labs:  Recent Labs Lab 03/22/15 1331 03/22/15 2229 03/22/15 2241 03/23/15 0538 03/24/15 0555  WBC 3.7* 4.7  --  3.6* 5.5  LATICACIDVEN  --   --  2.15*  --   --    Microbiology Recent Results (from the past 240 hour(s))  Culture, blood (routine x 2)     Status: None    Collection Time: 03/14/15  2:10 PM  Result Value Ref Range Status   Specimen Description BLOOD PORTA CATH  Final   Special Requests BOTTLES DRAWN AEROBIC AND ANAEROBIC 5 CC EA  Final   Culture   Final    NO GROWTH  5 DAYS Performed at [facility]    Report Status 03/19/2015 FINAL  Final  Culture, blood (routine x 2)     Status: None   Collection Time: 03/14/15  2:27 PM  Result Value Ref Range Status   Specimen Description BLOOD LEFT ANTECUBITAL  Final   Special Requests BOTTLES DRAWN AEROBIC AND ANAEROBIC 5ML  Final   Culture   Final    NO GROWTH 5 DAYS Performed at [facility]    Report Status 03/19/2015 FINAL  Final  Urine Culture     Status: None   Collection Time: 03/22/15  1:31 PM  Result Value Ref Range Status   Urine Culture, Routine Culture, Urine  Final    Comment: Final - ===== COLONY COUNT: ===== 6,000 COLONIES/ML Insignificant Growth   Culture, blood (routine x 2)     Status: None (Preliminary result)   Collection Time: 03/22/15 10:29 PM  Result Value Ref Range Status   Specimen Description BLOOD RIGHT ANTECUBITAL  Final   Special Requests BOTTLES DRAWN AEROBIC AND ANAEROBIC 5ML  Final   Culture   Final    NO GROWTH < 24 HOURS Performed at [facility]    Report Status PENDING  Incomplete  Culture, blood (routine x 2)     Status: None (Preliminary result)   Collection Time: 03/22/15 10:30 PM  Result Value Ref Range Status   Specimen Description   Final    BLOOD RIGHT PORTA CATH Performed at [facility]    Special Requests BOTTLES DRAWN AEROBIC AND ANAEROBIC 5ML  Final   Culture PENDING  Incomplete   Report Status PENDING  Incomplete     Medications:   . aspirin EC  81 mg Oral Daily  . enoxaparin (LOVENOX) injection  40 mg Subcutaneous Daily  . feeding supplement  1 Container Oral TID BM  . mirtazapine  15 mg Oral QHS  . phenytoin  100 mg Oral Daily  . phenytoin  200 mg Oral QHS  . piperacillin-tazobactam (ZOSYN)  IV   3.375 g Intravenous Q8H  . pravastatin  40 mg Oral QPM  . sodium chloride  3 mL Intravenous Q12H  . terazosin  10 mg Oral QHS  . vancomycin  750 mg Intravenous Q12H   Continuous Infusions: . 0.9 % NaCl with KCl 20 mEq / L 75 mL/hr at 03/24/15 0331    Time spent: 35 minutes.  The patient is medically complex with multiple co-morbidities and is at high risk for clinical deterioration and requires high complexity decision making.    LOS: 1 day   Marielouise Amey  [facility] Pager [phone]. If unable to reach me by pager, please call my cell phone at [phone].  *Please refer to [email or web address], password [email or web address] to get updated schedule on who will round on this patient, as hospitalists switch teams weekly. If 7PM-7AM, please contact night-coverage at [email or web address], password [email or web address] for any overnight needs.  03/24/2015, 9:02 AM

## 2015-03-25 DIAGNOSIS — D630 Anemia in neoplastic disease: Secondary | ICD-10-CM

## 2015-03-25 LAB — CBC
HEMATOCRIT: 26.7 % — AB (ref 39.0–52.0)
Hemoglobin: 9 g/dL — ABNORMAL LOW (ref 13.0–17.0)
MCH: 28.4 pg (ref 26.0–34.0)
MCHC: 33.7 g/dL (ref 30.0–36.0)
MCV: 84.2 fL (ref 78.0–100.0)
Platelets: 444 10*3/uL — ABNORMAL HIGH (ref 150–400)
RBC: 3.17 MIL/uL — AB (ref 4.22–5.81)
RDW: 14 % (ref 11.5–15.5)
WBC: 5.6 10*3/uL (ref 4.0–10.5)

## 2015-03-25 LAB — BASIC METABOLIC PANEL
ANION GAP: 9 (ref 5–15)
BUN: 7 mg/dL (ref 6–20)
CO2: 27 mmol/L (ref 22–32)
Calcium: 7.8 mg/dL — ABNORMAL LOW (ref 8.9–10.3)
Chloride: 103 mmol/L (ref 101–111)
Creatinine, Ser: 0.47 mg/dL — ABNORMAL LOW (ref 0.61–1.24)
GLUCOSE: 104 mg/dL — AB (ref 65–99)
POTASSIUM: 3.8 mmol/L (ref 3.5–5.1)
Sodium: 139 mmol/L (ref 135–145)

## 2015-03-25 NOTE — Progress Notes (Signed)
ANTIBIOTIC CONSULT NOTE - follow-up  Pharmacy Consult for Zosyn/Vancomycin Indication: Sepsis  No Known Allergies  Patient Measurements: Height: 5\' 8"  (172.7 cm) Weight: 122 lb 12.7 oz (55.7 kg) IBW/kg (Calculated) : 68.4 Wt=55 kg  Vital Signs: Temp: 98.3 F (36.8 C) (11/06 1028) Temp Source: Oral (11/06 1028) BP: 142/73 mmHg (11/06 1028) Pulse Rate: 84 (11/06 1028) Intake/Output from previous day: 11/05 0701 - 11/06 0700 In: 2445 [P.O.:720; I.V.:1275; IV Piggyback:450] Out: 485 [Urine:485] Intake/Output from this shift: Total I/O In: 603.8 [I.V.:603.8] Out: 400 [Urine:400]  Labs:  Recent Labs  03/23/15 0538 03/24/15 0555 03/25/15 0850  WBC 3.6* 5.5 5.6  HGB 9.7* 8.2* 9.0*  PLT 248 357 444*  CREATININE 0.39* 0.48* 0.47*   Estimated Creatinine Clearance: 67.7 mL/min (by C-G formula based on Cr of 0.47). No results for input(s): VANCOTROUGH, VANCOPEAK, VANCORANDOM, GENTTROUGH, GENTPEAK, GENTRANDOM, TOBRATROUGH, TOBRAPEAK, TOBRARND, AMIKACINPEAK, AMIKACINTROU, AMIKACIN in the last 72 hours.   Microbiology: Recent Results (from the past 720 hour(s))  Culture, blood (routine x 2)     Status: None   Collection Time: 03/14/15  2:10 PM  Result Value Ref Range Status   Specimen Description BLOOD PORTA CATH  Final   Special Requests BOTTLES DRAWN AEROBIC AND ANAEROBIC 5 CC EA  Final   Culture   Final    NO GROWTH 5 DAYS Performed at [facility]    Report Status 03/19/2015 FINAL  Final  Culture, blood (routine x 2)     Status: None   Collection Time: 03/14/15  2:27 PM  Result Value Ref Range Status   Specimen Description BLOOD LEFT ANTECUBITAL  Final   Special Requests BOTTLES DRAWN AEROBIC AND ANAEROBIC 5ML  Final   Culture   Final    NO GROWTH 5 DAYS Performed at [facility]    Report Status 03/19/2015 FINAL  Final  Urine Culture     Status: None   Collection Time: 03/22/15  1:31 PM  Result Value Ref Range Status   Urine Culture, Routine  Culture, Urine  Final    Comment: Final - ===== COLONY COUNT: ===== 6,000 COLONIES/ML Insignificant Growth   Culture, blood (routine x 2)     Status: None (Preliminary result)   Collection Time: 03/22/15 10:29 PM  Result Value Ref Range Status   Specimen Description BLOOD RIGHT ANTECUBITAL  Final   Special Requests BOTTLES DRAWN AEROBIC AND ANAEROBIC 5ML  Final   Culture   Final    NO GROWTH 3 DAYS Performed at [facility]    Report Status PENDING  Incomplete  Culture, blood (routine x 2)     Status: None (Preliminary result)   Collection Time: 03/22/15 10:30 PM  Result Value Ref Range Status   Specimen Description BLOOD RIGHT PORTA CATH  Final   Special Requests BOTTLES DRAWN AEROBIC AND ANAEROBIC 5ML  Final   Culture   Final    NO GROWTH 2 DAYS Performed at [facility]    Report Status PENDING  Incomplete  Urine culture     Status: None   Collection Time: 03/23/15  8:26 AM  Result Value Ref Range Status   Specimen Description URINE, CLEAN CATCH  Final   Special Requests NONE  Final   Culture   Final    NO GROWTH 1 DAY Performed at [facility]    Report Status 03/24/2015 FINAL  Final   Assessment: 89 yoM with stage IV esophageal Ca admitted with fever.  Zosyn/Vancomycin per Rx  for Sepsis in patient receiving chemotherapy.Marland Kitchen   11/3 >> Levaquin 500mg  x1 then outpt Rx written, but never picked up. 11/4 >>zosyn  >> 11/4 >>vancomycin  >>    11/3 blood x2: NGTD 11/4 urine: NG  Renal: Scr low/stable WBC WNL afebrile  Goal of Therapy:  Vancomycin trough level 15-20 mcg/ml  Plan:  Day #3 antibiotics  Zosyn 3.375 Gm IV q8h EI  Vancomycin 1Gm x1 then 750mg  IV q12h  Check trough 11/7 if to continue beyond 11/7  F/u SCr/cultures/levels  Doreene Eland, PharmD, BCPS.   Pager: [phone] 03/25/2015 1:16 PM

## 2015-03-25 NOTE — Progress Notes (Signed)
Progress Note   Andre Guzman JHE:[id number] DOB: December 22, 1945 DOA: 03/22/2015 PCP: Maximino Greenland, MD   Brief Narrative:   Andre Guzman is an 69 y.o. male with a PMH of CAD D, epilepsy, hypertension, COPD and metastatic esophageal cancer diagnosed 01/02/15, status post palliative radiation and currently receiving FOLFOX palliative chemotherapy, last cycle given 03/08/15, who was admitted 03/23/15 with fever after failing outpatient treatment with Levaquin. WBC was 3.6 on admission. Chest x-ray was negative.  Assessment/Plan:   Principal Problem:   Fever in the setting of recent chemotherapy - Patient is not currently neutropenic. - Continue empiric IV antibiotics with vancomycin and Zosyn. - Follow-up blood and urine cultures.  Active Problems:   CAD (coronary artery disease), native coronary artery - Continue aspirin and statin. Imdur on hold.    Essential hypertension, benign - Maxide currently on hold.    Seizure disorder (HCC) - Continue Dilantin.    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 Status post palliative radiation therapy.    Anemia associated with anti-neoplastic chemotherapy - Hemoglobin 8.2. Transfuse for less than 7.    Hyponatremia - Resolved with IV fluids.    DVT Prophylaxis - Lovenox ordered.   Family Communication/Anticipated D/C date and plan/Code Status   Family Communication: No family currently at the bedside.  Spoke with niece, Angus Palms, by telephone, when she called into the room while I was rounding on the patient. Disposition Plan: Home when afebrile 48 hours if cultures negative. Anticipated D/C date:   24-48 hours. Code Status:     Code Status Orders        Start     Ordered   03/23/15 0314  Full code   Continuous     03/23/15 0313       IV Access:    Porta-Cath   Procedures and diagnostic studies:   Dg Chest 2 View  03/22/2015  CLINICAL DATA:  Fever after chemotherapy  treatment today. EXAM: CHEST  2 VIEW COMPARISON:  03/22/2015 FINDINGS: There is a right jugular central line with tip at the cavoatrial junction. The lungs are clear. The pulmonary vasculature is normal. There are no pleural effusions. Hilar and mediastinal contours are unremarkable unchanged. IMPRESSION: No active cardiopulmonary disease. Electronically Signed   By: Andreas Newport M.D.   On: 03/22/2015 22:24   Dg Chest 2 View  03/22/2015  CLINICAL DATA:  Esophageal cancer history. EXAM: CHEST  2 VIEW COMPARISON:  03/14/2015. FINDINGS: Power Port catheter in stable position. Mediastinum hilar structures are normal. Prior median sternotomy. Heart size stable. Lungs are clear. No pleural effusion or pneumothorax. Small metallic pellet in stable position over the right chest. IMPRESSION: 1. PowerPort catheter stable position. 2. No acute cardiopulmonary disease. Prior median sternotomy. Chest is stable from prior exam. Electronically Signed   By: Marcello Moores  Register   On: 03/22/2015 14:50     Medical Consultants:    None.  Anti-Infectives:   Anti-infectives    Start     Dose/Rate Route Frequency Ordered Stop   03/23/15 1200  vancomycin (VANCOCIN) IVPB 750 mg/150 ml premix     750 mg 150 mL/hr over 60 Minutes Intravenous Every 12 hours 03/23/15 0033     03/23/15 0800  piperacillin-tazobactam (ZOSYN) IVPB 3.375 g     3.375 g 12.5 mL/hr over 240 Minutes Intravenous Every 8 hours 03/23/15 0014     03/22/15 2315  piperacillin-tazobactam (ZOSYN) IVPB 3.375 g     3.375 g 100  mL/hr over 30 Minutes Intravenous  Once 03/22/15 2300 03/23/15 0029   03/22/15 2315  vancomycin (VANCOCIN) IVPB 1000 mg/200 mL premix     1,000 mg 200 mL/hr over 60 Minutes Intravenous  Once 03/22/15 2300 03/23/15 0151      Subjective:   [name] denies dyspnea, nausea or vomiting.  No diarrhea.  No dysuria.  Has not eaten anything yet today.  Appetite poor.   Last BM was 5 days ago.  Denies feeling  constipated.  Objective:    Filed Vitals:   03/23/15 2308 03/24/15 0526 03/24/15 2128 03/25/15 0616  BP: 114/59 117/70 133/73 126/66  Pulse: 91 87 88 93  Temp: 99.7 F (37.6 C) 98.9 F (37.2 C) 99.1 F (37.3 C) 99 F (37.2 C)  TempSrc: Oral Oral Oral Oral  Resp: 20 20 20 22   Height:      Weight:      SpO2: 100% 100% 100% 100%    Intake/Output Summary (Last 24 hours) at 03/25/15 0756 Last data filed at 03/25/15 0703  Gross per 24 hour  Intake 2998.75 ml  Output    485 ml  Net 2513.75 ml   Filed Weights   03/23/15 0146  Weight: 55.7 kg (122 lb 12.7 oz)    Exam: Gen:  NAD Cardiovascular:  RRR, No M/R/G Respiratory:  Lungs with faint rhonchi on the right Gastrointestinal:  Abdomen soft, NT/ND, + BS Extremities:  No C/E/C   Data Reviewed:    Labs: Basic Metabolic Panel:  Recent Labs Lab 03/21/15 1447 03/22/15 1335  03/22/15 2229 03/23/15 0538 03/24/15 0555  NA 135* 132*  --  132* 134* 135  K 4.3 3.5  < > 3.6 3.0* 3.5  CL  --   --   --  97* 99* 101  CO2 29 27  --  26 26 28   GLUCOSE 135 147*  --  132* 119* 92  BUN 10.9 9.0  --  11 9 7   CREATININE 0.7 0.6*  --  0.61 0.39* 0.48*  CALCIUM 9.1 8.4  --  7.9* 7.8* 7.8*  MG  --   --   --   --  1.7  --   PHOS  --   --   --   --  2.7  --   < > = values in this interval not displayed. GFR Estimated Creatinine Clearance: 67.7 mL/min (by C-G formula based on Cr of 0.48). Liver Function Tests:  Recent Labs Lab 03/21/15 1447 03/22/15 2229 03/23/15 0538  AST 34 48* 37  ALT 24 27 23   ALKPHOS 201* 153* 150*  BILITOT 0.68 0.6 0.7  PROT 7.9 6.8 6.4*  ALBUMIN 2.1* 2.2* 1.9*   CBC:  Recent Labs Lab 03/21/15 1447 03/22/15 1331 03/22/15 2229 03/23/15 0538 03/24/15 0555  WBC 2.5* 3.7* 4.7 3.6* 5.5  NEUTROABS 1.0* 2.0 2.7 2.5  --   HGB 10.2* 8.7* 7.7* 9.7* 8.2*  HCT 31.0* 26.1* 22.3* 28.2* 23.9*  MCV 87.0 86.4 83.2 83.7 82.7  PLT 250 300 262 248 357   Sepsis Labs:  Recent Labs Lab 03/22/15 1331  03/22/15 2229 03/22/15 2241 03/23/15 0538 03/24/15 0555  WBC 3.7* 4.7  --  3.6* 5.5  LATICACIDVEN  --   --  2.15*  --   --    Microbiology Recent Results (from the past 240 hour(s))  Urine Culture     Status: None   Collection Time: 03/22/15  1:31 PM  Result Value Ref Range Status  Urine Culture, Routine Culture, Urine  Final    Comment: Final - ===== COLONY COUNT: ===== 6,000 COLONIES/ML Insignificant Growth   Culture, blood (routine x 2)     Status: None (Preliminary result)   Collection Time: 03/22/15 10:29 PM  Result Value Ref Range Status   Specimen Description BLOOD RIGHT ANTECUBITAL  Final   Special Requests BOTTLES DRAWN AEROBIC AND ANAEROBIC 5ML  Final   Culture   Final    NO GROWTH 2 DAYS Performed at [facility]    Report Status PENDING  Incomplete  Culture, blood (routine x 2)     Status: None (Preliminary result)   Collection Time: 03/22/15 10:30 PM  Result Value Ref Range Status   Specimen Description BLOOD RIGHT PORTA CATH  Final   Special Requests BOTTLES DRAWN AEROBIC AND ANAEROBIC 5ML  Final   Culture   Final    NO GROWTH 1 DAY Performed at [facility]    Report Status PENDING  Incomplete  Urine culture     Status: None   Collection Time: 03/23/15  8:26 AM  Result Value Ref Range Status   Specimen Description URINE, CLEAN CATCH  Final   Special Requests NONE  Final   Culture   Final    NO GROWTH 1 DAY Performed at [facility]    Report Status 03/24/2015 FINAL  Final     Medications:   . aspirin EC  81 mg Oral Daily  . enoxaparin (LOVENOX) injection  40 mg Subcutaneous Daily  . feeding supplement  1 Container Oral TID BM  . mirtazapine  15 mg Oral QHS  . phenytoin  100 mg Oral Daily  . phenytoin  200 mg Oral QHS  . piperacillin-tazobactam (ZOSYN)  IV  3.375 g Intravenous Q8H  . pravastatin  40 mg Oral QPM  . sodium chloride  3 mL Intravenous Q12H  . terazosin  10 mg Oral QHS  . vancomycin  750 mg Intravenous Q12H    Continuous Infusions: . 0.9 % NaCl with KCl 20 mEq / L 75 mL/hr at 03/25/15 0703    Time spent: 25 minutes.    LOS: 2 days   RAMA,CHRISTINA  [facility] Pager [phone]. If unable to reach me by pager, please call my cell phone at [phone].  *Please refer to [email or web address], password [email or web address] to get updated schedule on who will round on this patient, as hospitalists switch teams weekly. If 7PM-7AM, please contact night-coverage at [email or web address], password [email or web address] for any overnight needs.  03/25/2015, 7:56 AM

## 2015-03-26 LAB — BASIC METABOLIC PANEL
Anion gap: 7 (ref 5–15)
BUN: 6 mg/dL (ref 6–20)
CALCIUM: 7.8 mg/dL — AB (ref 8.9–10.3)
CHLORIDE: 103 mmol/L (ref 101–111)
CO2: 28 mmol/L (ref 22–32)
CREATININE: 0.45 mg/dL — AB (ref 0.61–1.24)
GFR calc Af Amer: >60 mL/min (ref >60)
GFR calc non Af Amer: >60 mL/min (ref >60)
Glucose, Bld: 103 mg/dL — ABNORMAL HIGH (ref 65–99)
Potassium: 3.6 mmol/L (ref 3.5–5.1)
SODIUM: 138 mmol/L (ref 135–145)

## 2015-03-26 LAB — CBC
HEMATOCRIT: 26.2 % — AB (ref 39.0–52.0)
Hemoglobin: 8.9 g/dL — ABNORMAL LOW (ref 13.0–17.0)
MCH: 28.6 pg (ref 26.0–34.0)
MCHC: 34 g/dL (ref 30.0–36.0)
MCV: 84.2 fL (ref 78.0–100.0)
Platelets: 445 10*3/uL — ABNORMAL HIGH (ref 150–400)
RBC: 3.11 MIL/uL — ABNORMAL LOW (ref 4.22–5.81)
RDW: 14.2 % (ref 11.5–15.5)
WBC: 5.1 10*3/uL (ref 4.0–10.5)

## 2015-03-26 LAB — PHENYTOIN LEVEL, FREE AND TOTAL
Phenytoin, Free: 2.6 ug/mL — ABNORMAL HIGH (ref 1.0–2.0)
Phenytoin, Total: 12.4 ug/mL (ref 10.0–20.0)

## 2015-03-26 LAB — VANCOMYCIN, TROUGH: Vancomycin Tr: 11 ug/mL (ref 10.0–20.0)

## 2015-03-26 MED ORDER — BOOST / RESOURCE BREEZE PO LIQD: 1.0000 | Freq: Three times a day (TID) | ORAL | Status: AC

## 2015-03-26 MED ORDER — VANCOMYCIN HCL IN DEXTROSE 1-5 GM/200ML-% IV SOLN: 1000.0000 mg | Freq: Two times a day (BID) | INTRAVENOUS | Status: DC

## 2015-03-26 MED ORDER — HEPARIN SOD (PORK) LOCK FLUSH 100 UNIT/ML IV SOLN
500.0000 [IU] | INTRAVENOUS | Status: AC | PRN
  Administered 2015-03-26: 500 [IU]

## 2015-03-26 NOTE — Progress Notes (Signed)
ANTIBIOTIC CONSULT NOTE - follow-up  Pharmacy Consult for Zosyn/Vancomycin Indication: Sepsis  No Known Allergies  Patient Measurements: Height: 5\' 8"  (172.7 cm) Weight: 122 lb 12.7 oz (55.7 kg) IBW/kg (Calculated) : 68.4 Wt=55 kg  Vital Signs: Temp: 98.2 F (36.8 C) (11/07 0437) Temp Source: Oral (11/07 0437) BP: 145/74 mmHg (11/07 0437) Pulse Rate: 85 (11/07 0437) Intake/Output from previous day: 11/06 0701 - 11/07 0700 In: 2720 [P.O.:120; I.V.:2400; IV Piggyback:200] Out: 1201 [Urine:1200; Stool:1] Intake/Output from this shift:    Labs:  Recent Labs  03/24/15 0555 03/25/15 0850 03/26/15 0655  WBC 5.5 5.6 5.1  HGB 8.2* 9.0* 8.9*  PLT 357 444* 445*  CREATININE 0.48* 0.47* 0.45*   Estimated Creatinine Clearance: 67.7 mL/min (by C-G formula based on Cr of 0.45).  Recent Labs  03/26/15 1111  [place] 11     Microbiology: Recent Results (from the past 720 hour(s))  Culture, blood (routine x 2)     Status: None   Collection Time: 03/14/15  2:10 PM  Result Value Ref Range Status   Specimen Description BLOOD PORTA CATH  Final   Special Requests BOTTLES DRAWN AEROBIC AND ANAEROBIC 5 CC EA  Final   Culture   Final    NO GROWTH 5 DAYS Performed at [facility]    Report Status 03/19/2015 FINAL  Final  Culture, blood (routine x 2)     Status: None   Collection Time: 03/14/15  2:27 PM  Result Value Ref Range Status   Specimen Description BLOOD LEFT ANTECUBITAL  Final   Special Requests BOTTLES DRAWN AEROBIC AND ANAEROBIC 5ML  Final   Culture   Final    NO GROWTH 5 DAYS Performed at [facility]    Report Status 03/19/2015 FINAL  Final  Urine Culture     Status: None   Collection Time: 03/22/15  1:31 PM  Result Value Ref Range Status   Urine Culture, Routine Culture, Urine  Final    Comment: Final - ===== COLONY COUNT: ===== 6,000 COLONIES/ML Insignificant Growth   Culture, blood (routine x 2)     Status: None (Preliminary result)   Collection Time: 03/22/15 10:29 PM  Result Value Ref Range Status   Specimen Description BLOOD RIGHT ANTECUBITAL  Final   Special Requests BOTTLES DRAWN AEROBIC AND ANAEROBIC 5ML  Final   Culture   Final    NO GROWTH 4 DAYS Performed at [facility]    Report Status PENDING  Incomplete  Culture, blood (routine x 2)     Status: None (Preliminary result)   Collection Time: 03/22/15 10:30 PM  Result Value Ref Range Status   Specimen Description BLOOD RIGHT PORTA CATH  Final   Special Requests BOTTLES DRAWN AEROBIC AND ANAEROBIC 5ML  Final   Culture   Final    NO GROWTH 3 DAYS Performed at [facility]    Report Status PENDING  Incomplete  Urine culture     Status: None   Collection Time: 03/23/15  8:26 AM  Result Value Ref Range Status   Specimen Description URINE, CLEAN CATCH  Final   Special Requests NONE  Final   Culture   Final    NO GROWTH 1 DAY Performed at [facility]    Report Status 03/24/2015 FINAL  Final   Assessment: 80 yoM with stage IV esophageal Ca admitted with fever.  Zosyn/Vancomycin per Rx for Sepsis in patient receiving chemotherapy.Marland Kitchen   11/3 >> Levaquin 500mg  x1 then outpt Rx  written, but never picked up. 11/4 >>zosyn  >> 11/4 >>vancomycin  >>    11/3 blood x2: NGTD 11/4 urine: NG  Renal: Scr low/stable WBC WNL Afebrile Vancomycin trough low at 11 with 750 mg q12h dosing  Goal of Therapy:  Vancomycin trough level 15-20 mcg/ml  Plan:  Day #4 antibiotics  Zosyn 3.375 Gm IV q8h EI  Adjust Vancomycin to 1g q12h  F/u SCr/cultures/levels  Hershal Coria, PharmD, BCPS Pager: [phone] 03/26/2015 1:26 PM

## 2015-03-26 NOTE — Discharge Summary (Addendum)
Physician Discharge Summary  Andre Guzman OEU:[id number] DOB: April 06, 1946 DOA: 03/22/2015  PCP: Maximino Greenland, MD  Admit date: 03/22/2015 Discharge date: 03/26/2015   Recommendations for Outpatient Follow-Up:   1. No physical therapy follow-up recommended status post physical therapy evaluation in-house. 2. No source of fever identified. Please follow-up final blood culture results.   Discharge Diagnosis:   Principal Problem:    Fever in an immunocompromised host Active Problems:    CAD (coronary artery disease), native coronary artery    Essential hypertension, benign    Seizure disorder (HCC)    Squamous cell esophageal cancer (HCC)    Anemia    Hyponatremia    Underweight    Severe protein calorie malnutrition   Discharge disposition:  Home.    Discharge Condition: Improved.  Diet recommendation: Regular.  History of Present Illness:   Andre Guzman is an 69 y.o. male with a PMH of CAD D, epilepsy, hypertension, COPD and metastatic esophageal cancer diagnosed 01/02/15, status post palliative radiation and currently receiving FOLFOX palliative chemotherapy, last cycle given 03/08/15, who was admitted 03/23/15 with fever after failing outpatient treatment with Levaquin. WBC was 3.6 on admission. Chest x-ray was negative.  Hospital Course by Problem:   Principal Problem:  Fever in the setting of recent chemotherapy - Patient immunocompromised given recent chemotherapy but not neutropenic on admission. - Treated with empiric IV antibiotics with vancomycin and Zosyn. - Urine cultures negative. Blood cultures negative to date. Afebrile 48 hours. - Discharge home on Levaquin.  Active Problems:   Severe protein calorie malnutrition/underweight - Secondary to cancer related cachexia. Evaluated by dietitian. Nutritional supplements ordered.   CAD (coronary artery disease), native coronary artery - Continue aspirin and statin. Imdur on hold (reports that he  does not take this any longer).   Essential hypertension, benign - Maxide held on admission, okay to resume.   Seizure disorder (HCC) - Continue Dilantin.   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 Status post palliative radiation therapy. - Follow-up with oncologist post discharge.   Anemia associated with anti-neoplastic chemotherapy - Hemoglobin stable, no need for transfusion while in the hospital.   Hyponatremia - Resolved with IV fluids.  Medical Consultants:    None.   Discharge Exam:   Filed Vitals:   03/26/15 0437  BP: 145/74  Pulse: 85  Temp: 98.2 F (36.8 C)  Resp: 22   Filed Vitals:   03/25/15 1028 03/25/15 1408 03/25/15 2142 03/26/15 0437  BP: 142/73 137/80 155/82 145/74  Pulse: 84 88 92 85  Temp: 98.3 F (36.8 C) 98.4 F (36.9 C) 99 F (37.2 C) 98.2 F (36.8 C)  TempSrc: Oral Oral Oral Oral  Resp: 16 18 20 22   Height:      Weight:      SpO2: 100% 100% 100% 100%    Gen:  NAD Cardiovascular:  RRR, No M/R/G Respiratory: Lungs CTAB Gastrointestinal: Abdomen soft, NT/ND with normal active bowel sounds. Extremities: No C/E/C   The results of significant diagnostics from this hospitalization (including imaging, microbiology, ancillary and laboratory) are listed below for reference.     Procedures and Diagnostic Studies:   Dg Chest 2 View  03/22/2015  CLINICAL DATA:  Fever after chemotherapy treatment today. EXAM: CHEST  2 VIEW COMPARISON:  03/22/2015 FINDINGS: There is a right jugular central line with tip at the cavoatrial junction. The lungs are clear. The pulmonary vasculature is normal. There are no pleural effusions. Hilar and mediastinal contours are  unremarkable unchanged. IMPRESSION: No active cardiopulmonary disease. Electronically Signed   By: Andreas Newport M.D.   On: 03/22/2015 22:24   Dg Chest 2 View  03/22/2015  CLINICAL DATA:  Esophageal cancer history. EXAM: CHEST  2 VIEW  COMPARISON:  03/14/2015. FINDINGS: Power Port catheter in stable position. Mediastinum hilar structures are normal. Prior median sternotomy. Heart size stable. Lungs are clear. No pleural effusion or pneumothorax. Small metallic pellet in stable position over the right chest. IMPRESSION: 1. PowerPort catheter stable position. 2. No acute cardiopulmonary disease. Prior median sternotomy. Chest is stable from prior exam. Electronically Signed   By: Marcello Moores  Register   On: 03/22/2015 14:50     Labs:   Basic Metabolic Panel:  Recent Labs Lab 03/22/15 2229 03/23/15 0538 03/24/15 0555 03/25/15 0850 03/26/15 0655  NA 132* 134* 135 139 138  K 3.6 3.0* 3.5 3.8 3.6  CL 97* 99* 101 103 103  CO2 26 26 28 27 28   GLUCOSE 132* 119* 92 104* 103*  BUN 11 9 7 7 6   CREATININE 0.61 0.39* 0.48* 0.47* 0.45*  CALCIUM 7.9* 7.8* 7.8* 7.8* 7.8*  MG  --  1.7  --   --   --   PHOS  --  2.7  --   --   --    GFR Estimated Creatinine Clearance: 67.7 mL/min (by C-G formula based on Cr of 0.45). Liver Function Tests:  Recent Labs Lab 03/21/15 1447 03/22/15 2229 03/23/15 0538  AST 34 48* 37  ALT 24 27 23   ALKPHOS 201* 153* 150*  BILITOT 0.68 0.6 0.7  PROT 7.9 6.8 6.4*  ALBUMIN 2.1* 2.2* 1.9*    CBC:  Recent Labs Lab 03/21/15 1447 03/22/15 1331 03/22/15 2229 03/23/15 0538 03/24/15 0555 03/25/15 0850 03/26/15 0655  WBC 2.5* 3.7* 4.7 3.6* 5.5 5.6 5.1  NEUTROABS 1.0* 2.0 2.7 2.5  --   --   --   HGB 10.2* 8.7* 7.7* 9.7* 8.2* 9.0* 8.9*  HCT 31.0* 26.1* 22.3* 28.2* 23.9* 26.7* 26.2*  MCV 87.0 86.4 83.2 83.7 82.7 84.2 84.2  PLT 250 300 262 248 357 444* 445*   Microbiology Recent Results (from the past 240 hour(s))  Urine Culture     Status: None   Collection Time: 03/22/15  1:31 PM  Result Value Ref Range Status   Urine Culture, Routine Culture, Urine  Final    Comment: Final - ===== COLONY COUNT: ===== 6,000 COLONIES/ML Insignificant Growth   Culture, blood (routine x 2)     Status: None  (Preliminary result)   Collection Time: 03/22/15 10:29 PM  Result Value Ref Range Status   Specimen Description BLOOD RIGHT ANTECUBITAL  Final   Special Requests BOTTLES DRAWN AEROBIC AND ANAEROBIC 5ML  Final   Culture   Final    NO GROWTH 4 DAYS Performed at [facility]    Report Status PENDING  Incomplete  Culture, blood (routine x 2)     Status: None (Preliminary result)   Collection Time: 03/22/15 10:30 PM  Result Value Ref Range Status   Specimen Description BLOOD RIGHT PORTA CATH  Final   Special Requests BOTTLES DRAWN AEROBIC AND ANAEROBIC 5ML  Final   Culture   Final    NO GROWTH 3 DAYS Performed at [facility]    Report Status PENDING  Incomplete  Urine culture     Status: None   Collection Time: 03/23/15  8:26 AM  Result Value Ref Range Status   Specimen Description URINE,  CLEAN CATCH  Final   Special Requests NONE  Final   Culture   Final    NO GROWTH 1 DAY Performed at [facility]    Report Status 03/24/2015 FINAL  Final     Discharge Instructions:       Discharge Instructions    Call MD for:  extreme fatigue    Complete by:  As directed      Call MD for:  persistant nausea and vomiting    Complete by:  As directed      Call MD for:  severe uncontrolled pain    Complete by:  As directed      Call MD for:  temperature >100.4    Complete by:  As directed      Diet general    Complete by:  As directed      Increase activity slowly    Complete by:  As directed             Medication List    STOP taking these medications        isosorbide mononitrate 120 MG 24 hr tablet  Commonly known as:  IMDUR     ondansetron 8 MG tablet  Commonly known as:  ZOFRAN     sucralfate 1 G tablet  Commonly known as:  CARAFATE      TAKE these medications        acetaminophen 500 MG tablet  Commonly known as:  TYLENOL  Take 500 mg by mouth every 6 (six) hours as needed for fever.     albuterol 108 (90 BASE) MCG/ACT inhaler  Commonly  known as:  PROVENTIL HFA;VENTOLIN HFA  Inhale 1-2 puffs into the lungs every 6 (six) hours as needed for wheezing or shortness of breath.     aspirin EC 81 MG tablet  Take 81 mg by mouth daily.     feeding supplement Liqd  Take 1 Container by mouth 3 (three) times daily between meals.     levofloxacin 500 MG tablet  Commonly known as:  LEVAQUIN  Take 1 tablet (500 mg total) by mouth daily.     lidocaine-prilocaine cream  Commonly known as:  EMLA  Apply to affected area once     mirtazapine 15 MG tablet  Commonly known as:  REMERON  Take 15 mg by mouth at bedtime.     morphine 20 MG/ML concentrated solution  Commonly known as:  ROXANOL  Take 0.25 mLs (5 mg total) by mouth every 6 (six) hours as needed for severe pain.     phenytoin 100 MG ER capsule  Commonly known as:  DILANTIN  Take 100-200 mg by mouth 2 (two) times daily. He takes one capsule in the morning and two capsules at bedtime.     pravastatin 40 MG tablet  Commonly known as:  PRAVACHOL  Take 40 mg by mouth every evening.     PRESCRIPTION MEDICATION  He receives his chemo treatments at the [facility] with Dr. Burr Medico. He is on a 14 day cycle of FOLFOX.     terazosin 10 MG capsule  Commonly known as:  HYTRIN  Take 10 mg by mouth at bedtime.     triamterene-hydrochlorothiazide 37.5-25 MG tablet  Commonly known as:  MAXZIDE-25  Take 1 tablet by mouth daily.       Follow-up Information    Follow up with Truitt Merle, MD.   Specialties:  Hematology, Oncology   Why:  At your scheduled appt times noted below.   Contact information:   [place] [address] [phone]        Time coordinating discharge: 35 minutes.  Signed:  RAMA,CHRISTINA  Pager [phone] [facility] 03/26/2015, 3:14 PM

## 2015-03-26 NOTE — Discharge Instructions (Signed)
Fever, Adult °A fever is an increase in the body's temperature. It is often defined as a temperature of 100° F (38°C) or higher. Short mild or moderate fevers often have no long-term effects. They also often do not need treatment. Moderate or high fevers may make you feel uncomfortable. Sometimes, they can also be a sign of a serious illness or disease. The sweating that may happen with repeated fevers or fevers that last a while may also cause you to not have enough fluid in your body (dehydration). °You can take your temperature with a thermometer to see if you have a fever. A measured temperature can change with: °· Age. °· Time of day. °· Where the thermometer is placed: °¨ Mouth (oral). °¨ Rectum (rectal). °¨ Ear (tympanic). °¨ Underarm (axillary). °¨ Forehead (temporal). °HOME CARE °Pay attention to any changes in your symptoms. Take these actions to help with your condition: °· Take over-the-counter and prescription medicines only as told by your doctor. Follow the dosing instructions carefully. °· If you were prescribed an antibiotic medicine, take it as told by your doctor. Do not stop taking the antibiotic even if you start to feel better. °· Rest as needed. °· Drink enough fluid to keep your pee (urine) clear or pale yellow. °· Sponge yourself or bathe with room-temperature water as needed. This helps to lower your body temperature . Do not use ice water. °· Do not wear too many blankets or heavy clothes. °GET HELP IF: °· You throw up (vomit). °· You cannot eat or drink without throwing up. °· You have watery poop (diarrhea). °· It hurts when you pee. °· Your symptoms do not get better with treatment. °· You have new symptoms. °· You feel very weak. °GET HELP RIGHT AWAY IF: °· You are short of breath or have trouble breathing. °· You are dizzy or you pass out (faint). °· You feel confused. °· You have signs of not having enough fluid in your body, such as: °¨ A dry mouth. °¨ Peeing less. °¨ Looking  pale. °· You have very bad pain in your belly (abdomen). °· You keep throwing up or having water poop. °· You have a skin rash. °· Your symptoms suddenly get worse. °  °This information is not intended to replace advice given to you by your health care provider. Make sure you discuss any questions you have with your health care provider. °  °Document Released: 02/12/2008 Document Revised: 01/24/2015 Document Reviewed: 06/29/2014 °Elsevier Interactive Patient Education ©2016 [employer] ° °

## 2015-03-26 NOTE — Evaluation (Signed)
Physical Therapy Evaluation Patient Details Name: Andre Guzman MRN: [id number] DOB: 1946-03-05 Today's Date: 03/26/2015   History of Present Illness  70 y.o. male with a PMH of CAD, stroke, epilepsy, hypertension, COPD and metastatic esophageal cancer diagnosed 01/02/15, status post palliative radiation and currently receiving FOLFOX palliative chemotherapy, who was admitted 03/23/15 with fever after failing outpatient treatment with Levaquin  Clinical Impression  Pt admitted with above diagnosis. Pt currently with functional limitations due to the deficits listed below (see PT Problem List).  Pt will benefit from skilled PT to increase their independence and safety with mobility to allow discharge to the venue listed below.  Pt tolerated good distance ambulating using RW today.  Pt has RW and SPC available to use at home.  Will continue to see pt if remains in acute however no anticipated PT needs upon d/c.     Follow Up Recommendations No PT follow up    Equipment Recommendations  None recommended by PT    Recommendations for Other Services       Precautions / Restrictions Precautions Precautions: None      Mobility  Bed Mobility Overal bed mobility: Modified Independent                Transfers Overall transfer level: Needs assistance Equipment used: Rolling walker (2 wheeled) Transfers: Sit to/from Stand Sit to Stand: Supervision            Ambulation/Gait Ambulation/Gait assistance: Min guard;Supervision Ambulation Distance (Feet): 280 Feet Assistive device: Rolling walker (2 wheeled) Gait Pattern/deviations: Step-through pattern     General Gait Details: steady gait with RW, no LOB observed, slow pace however pt reports first time ambulating in a few days, pt agreeable to use SPC or RW until feeling 100% better upon d/c  Stairs            Wheelchair Mobility    Modified Rankin (Stroke Patients Only)       Balance Overall balance assessment:   (denies falls)                                           Pertinent Vitals/Pain Pain Assessment: No/denies pain    Home Living Family/patient expects to be discharged to:: Private residence Living Arrangements: Spouse/significant other   Type of Home: House       Home Layout: One level Home Equipment: Environmental consultant - 2 wheels;Cane - single point      Prior Function Level of Independence: Independent               Hand Dominance        Extremity/Trunk Assessment               Lower Extremity Assessment: Overall WFL for tasks assessed      Cervical / Trunk Assessment: Normal  Communication   Communication: No difficulties  Cognition Arousal/Alertness: Awake/alert Behavior During Therapy: WFL for tasks assessed/performed Overall Cognitive Status: Within Functional Limits for tasks assessed                      General Comments      Exercises        Assessment/Plan    PT Assessment Patient needs continued PT services  PT Diagnosis Difficulty walking   PT Problem List Decreased mobility;Decreased activity tolerance  PT Treatment Interventions DME instruction;Gait training;Functional mobility training;Patient/family education;Therapeutic exercise;Therapeutic  activities;Stair training   PT Goals (Current goals can be found in the Care Plan section) Acute Rehab PT Goals PT Goal Formulation: With patient Time For Goal Achievement: 04/02/15 Potential to Achieve Goals: Good    Frequency Min 3X/week   Barriers to discharge        Co-evaluation               End of Session   Activity Tolerance: Patient tolerated treatment well Patient left: in bed;with call bell/phone within reach Nurse Communication: Mobility status         Time: 1112-1127 PT Time Calculation (min) (ACUTE ONLY): 15 min   Charges:   PT Evaluation $Initial PT Evaluation Tier I: 1 Procedure     PT G Codes:        [place]iski Feldpausch,KATHrine  E 03/26/2015, 12:30 PM Carmelia Bake, PT, DPT 03/26/2015 Pager: [phone]

## 2015-03-26 NOTE — Care Management Important Message (Signed)
Important Message  Patient Details  Name: Andre Guzman MRN: [id number] Date of Birth: December 28, 1945   Medicare Important Message Given:  Yes-second notification given    Camillo Flaming 03/26/2015, 10:42 AMImportant Message  Patient Details  Name: Andre Guzman MRN: [id number] Date of Birth: 02/10/1946   Medicare Important Message Given:  Yes-second notification given    Camillo Flaming 03/26/2015, 10:42 AM

## 2015-03-27 DIAGNOSIS — E43 Unspecified severe protein-calorie malnutrition: Secondary | ICD-10-CM | POA: Diagnosis present

## 2015-03-27 DIAGNOSIS — R636 Underweight: Secondary | ICD-10-CM | POA: Diagnosis present

## 2015-03-27 LAB — CULTURE, BLOOD (ROUTINE X 2): Culture: NO GROWTH

## 2015-03-28 LAB — CULTURE, BLOOD (ROUTINE X 2): Culture: NO GROWTH

## 2015-03-28 LAB — CULTURE, BLOOD (SINGLE)

## 2015-04-05 ENCOUNTER — Ambulatory Visit (HOSPITAL_BASED_OUTPATIENT_CLINIC_OR_DEPARTMENT_OTHER): Payer: Medicare Other

## 2015-04-05 ENCOUNTER — Other Ambulatory Visit (HOSPITAL_BASED_OUTPATIENT_CLINIC_OR_DEPARTMENT_OTHER): Payer: Medicare Other

## 2015-04-05 ENCOUNTER — Ambulatory Visit: Payer: Medicare Other | Admitting: Nutrition

## 2015-04-05 ENCOUNTER — Ambulatory Visit (HOSPITAL_BASED_OUTPATIENT_CLINIC_OR_DEPARTMENT_OTHER): Payer: Medicare Other | Admitting: Hematology

## 2015-04-05 ENCOUNTER — Encounter: Payer: Self-pay | Admitting: Hematology

## 2015-04-05 VITALS — BP 137/69 | HR 94 | Temp 98.5°F | Resp 18 | Ht 68.0 in | Wt 135.9 lb

## 2015-04-05 DIAGNOSIS — Z5111 Encounter for antineoplastic chemotherapy: Secondary | ICD-10-CM | POA: Diagnosis not present

## 2015-04-05 DIAGNOSIS — C159 Malignant neoplasm of esophagus, unspecified: Secondary | ICD-10-CM

## 2015-04-05 DIAGNOSIS — G893 Neoplasm related pain (acute) (chronic): Secondary | ICD-10-CM | POA: Diagnosis not present

## 2015-04-05 DIAGNOSIS — C155 Malignant neoplasm of lower third of esophagus: Secondary | ICD-10-CM

## 2015-04-05 DIAGNOSIS — C7951 Secondary malignant neoplasm of bone: Secondary | ICD-10-CM | POA: Diagnosis not present

## 2015-04-05 DIAGNOSIS — R634 Abnormal weight loss: Secondary | ICD-10-CM | POA: Diagnosis not present

## 2015-04-05 DIAGNOSIS — D649 Anemia, unspecified: Secondary | ICD-10-CM

## 2015-04-05 DIAGNOSIS — R1310 Dysphagia, unspecified: Secondary | ICD-10-CM

## 2015-04-05 LAB — COMPREHENSIVE METABOLIC PANEL (CC13)
ALT: 16 U/L (ref 0–55)
AST: 26 U/L (ref 5–34)
Albumin: 2 g/dL — ABNORMAL LOW (ref 3.5–5.0)
Alkaline Phosphatase: 174 U/L — ABNORMAL HIGH (ref 40–150)
Anion Gap: 9 mEq/L (ref 3–11)
BUN: 7.9 mg/dL (ref 7.0–26.0)
CHLORIDE: 105 meq/L (ref 98–109)
CO2: 29 meq/L (ref 22–29)
CREATININE: 0.7 mg/dL (ref 0.7–1.3)
Calcium: 8.6 mg/dL (ref 8.4–10.4)
EGFR: >90 mL/min/{1.73_m2} (ref >90)
Glucose: 150 mg/dl — ABNORMAL HIGH (ref 70–140)
POTASSIUM: 3.9 meq/L (ref 3.5–5.1)
SODIUM: 143 meq/L (ref 136–145)
Total Bilirubin: <0.3 mg/dL (ref 0.20–1.20)
Total Protein: 6.8 g/dL (ref 6.4–8.3)

## 2015-04-05 LAB — CBC & DIFF AND RETIC
BASO%: 0.8 % (ref 0.0–2.0)
BASOS ABS: 0 10*3/uL (ref 0.0–0.1)
EOS%: 1.2 % (ref 0.0–7.0)
Eosinophils Absolute: 0.1 10*3/uL (ref 0.0–0.5)
HCT: 25.9 % — ABNORMAL LOW (ref 38.4–49.9)
HGB: 8.5 g/dL — ABNORMAL LOW (ref 13.0–17.1)
Immature Retic Fract: 9.6 % (ref 3.00–10.60)
LYMPH#: 0.8 10*3/uL — AB (ref 0.9–3.3)
LYMPH%: 15.5 % (ref 14.0–49.0)
MCH: 28.9 pg (ref 27.2–33.4)
MCHC: 32.8 g/dL (ref 32.0–36.0)
MCV: 88.1 fL (ref 79.3–98.0)
MONO#: 0.5 10*3/uL (ref 0.1–0.9)
MONO%: 10 % (ref 0.0–14.0)
NEUT%: 72.5 % (ref 39.0–75.0)
NEUTROS ABS: 3.6 10*3/uL (ref 1.5–6.5)
Platelets: 270 10*3/uL (ref 140–400)
RBC: 2.94 10*6/uL — AB (ref 4.20–5.82)
RDW: 17.8 % — AB (ref 11.0–14.6)
RETIC %: 2.53 % — AB (ref 0.80–1.80)
RETIC CT ABS: 74.38 10*3/uL (ref 34.80–93.90)
WBC: 4.9 10*3/uL (ref 4.0–10.3)

## 2015-04-05 MED ORDER — DEXTROSE 5 % IV SOLN
Freq: Once | INTRAVENOUS | Status: AC
  Administered 2015-04-05: 12:00:00 via INTRAVENOUS

## 2015-04-05 MED ORDER — OXALIPLATIN CHEMO INJECTION 100 MG/20ML
85.0000 mg/m2 | Freq: Once | INTRAVENOUS | Status: AC
  Administered 2015-04-05: 145 mg via INTRAVENOUS
  Filled 2015-04-05: qty 20

## 2015-04-05 MED ORDER — SODIUM CHLORIDE 0.9 % IV SOLN
Freq: Once | INTRAVENOUS | Status: AC
  Administered 2015-04-05: 12:00:00 via INTRAVENOUS
  Filled 2015-04-05: qty 4

## 2015-04-05 MED ORDER — SODIUM CHLORIDE 0.9 % IV SOLN
2200.0000 mg/m2 | INTRAVENOUS | Status: DC
  Administered 2015-04-05: 3700 mg via INTRAVENOUS
  Filled 2015-04-05: qty 74

## 2015-04-05 MED ORDER — LEUCOVORIN CALCIUM INJECTION 350 MG
400.0000 mg/m2 | Freq: Once | INTRAVENOUS | Status: AC
  Administered 2015-04-05: 676 mg via INTRAVENOUS
  Filled 2015-04-05: qty 33.8

## 2015-04-05 NOTE — Progress Notes (Signed)
[place]  Telephone:([phone]) [phone] [phone]  Clinic Follow up Note   Patient Care Team: Glendale Chard, MD as PCP - General (Internal Medicine) Adrian Prows, MD as Consulting Physician (Cardiology) Jovita Gamma, MD as Consulting Physician (Neurosurgery) Lowella Bandy, MD as Consulting Physician (Urology) Grace Isaac, MD as Consulting Physician (Cardiothoracic Surgery) Truitt Merle, MD as Consulting Physician (Hematology) Tania Ade, RN as Registered Nurse Carol Ada, MD as Consulting Physician (Gastroenterology) Gery Pray, MD as Consulting Physician (Radiation Oncology) 04/05/2015  CHIEF COMPLAINTS:  Follow up esophageal squamous cell carcinoma  Oncology History   Squamous cell esophageal cancer   Staging form: Esophagus - Squamous Cell Carcinoma, AJCC 7th Edition     Clinical: Stage IV (TX, N0, M1) - Unsigned       Squamous cell esophageal cancer ([place])   01/02/2015 Initial Diagnosis Squamous cell esophageal cancer   01/02/2015 Procedure EGD by Dr. Benson Norway showed a large, fungating mass with no bleeding and with stigmata met of recent bleeding was found in the third of the esophagus. The mass was completely obstructing and circumferential. The stomach and examined duodenum were normal.   01/02/2015 Imaging CT CAP showed 9 cm segment of irregular his social worker thickening. Single enlarged subcarinal lymph node, bilateral pulmonary nodules (3), 52mm right middle lobe next to right atrium, 3 RUL and a 7 mm lingular lung nodules.    01/02/2015 Initial Biopsy Esophageal mass biopsy showed poorly differentiated squamous cell carcinoma.   01/18/2015 Imaging PET scan showed hypermetabolic esophageal mass, subcarinal and possible right hilar adenopathy, several lung nodules, and a hypermetabolic bone lesion in right ilium, compatible with metastatic disease.    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    HISTORY OF PRESENTING ILLNESS:  Andre Guzman 69 y.o. male is here because of recently diagnosed esophageal squamous cell carcinoma.  He has had dysphagia for one month, with solid food mainly, he is able to keep liquid and soft diet down. He lost about 30 lbs in the the past month, but his weight has been stable in the past week since he started taking boost. He had some chest pain when he swallows. He is constipated, on stool softener, no bleeding.  He otherwise feels fine. His energy level is fair, he functions very well, no limitation on his physical activities. No nausea, no other pain, cough or other, he drinks about 2 boost a day, soup,and some soft diet.  He was referred to gastroenterologist Dr. Benson Norway by his primary care physician. He underwent EGD which showed a circumferential, obstructing, fungating mass in the third esophagus, biopsy showed poorly differentiated squamous cell carcinoma. CT scan chest abdomen and pelvis showed 3 lung nodules, with the largest 1.2 cm in the right middle lobe next to the right atrium.  CURRENT THERAPY: first line chemotherapy with mFOLFOX6, no 5-fu bolus, started on 02/22/15   INTERIM HISTORY Andre Guzman returns for follow-up. He had low-grade fever, fatigue and poor by mouth intake after second cycle chemotherapy, was admitted to hospital. ID workup was negative. He was subsequently discharged home. He has recovered well, his appetite has much improved, so does his energy level. He is able to function well. He denies  significant dysphagia, pain, or abdominal discomfort. His bowel movement is normal, he denies any hematochezia or melena.  MEDICAL HISTORY:  Past Medical History  Diagnosis Date  .  Coronary artery disease   . Shortness of breath   . Seizures ([place])   . Hypertension   . Pneumonia   . COPD (chronic obstructive pulmonary disease) ([place])   . Stroke ([place])   . Esophageal cancer ([place])   . Radiation 01/25/15-02/13/15    35 gray for esophageal cancer    SURGICAL HISTORY: Past Surgical History  Procedure Laterality Date  . Cardiac catheterization    . Coronary angioplasty with stent placement  07/22/2011  . Cardiac valve surgery    . Coronary artery bypass graft    . Lower extremity angiogram N/A 07/01/2011    Procedure: LOWER EXTREMITY ANGIOGRAM;  Surgeon: Laverda Page, MD;  Location: [facility] CATH LAB;  Service: Cardiovascular;  Laterality: N/A;  . Left heart catheterization with coronary angiogram N/A 07/01/2011    Procedure: LEFT HEART CATHETERIZATION WITH CORONARY ANGIOGRAM;  Surgeon: Laverda Page, MD;  Location: [facility] CATH LAB;  Service: Cardiovascular;  Laterality: N/A;  . Percutaneous coronary rotoblator intervention (pci-r) N/A 07/22/2011    Procedure: PERCUTANEOUS CORONARY ROTOBLATOR INTERVENTION (PCI-R);  Surgeon: Laverda Page, MD;  Location: [facility] CATH LAB;  Service: Cardiovascular;  Laterality: N/A;  . Left heart catheterization with coronary angiogram N/A 10/30/2011    Procedure: LEFT HEART CATHETERIZATION WITH CORONARY ANGIOGRAM;  Surgeon: Laverda Page, MD;  Location: [facility] CATH LAB;  Service: Cardiovascular;  Laterality: N/A;  . Esophagogastroduodenoscopy  01/02/15    SOCIAL HISTORY: Social History   Social History  . Marital Status: Married, lives with his wife     Spouse Name: N/A  . Number of Children: 43, 1 daughter and 3 sons, all lives in [place]   . Years of Education: N/A   Occupational History  . Retired Nature conservation officer    Social History Main Topics  . Smoking status: Current Every Day Smoker -- 0.50 packs/day for 50 years    Types: Cigarettes  . Smokeless tobacco: Never Used  . 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 Drug Use: No  . Sexual Activity: Not on file     Comment: did not ask   Other Topics Concern  . Not on file   Social History Narrative    FAMILY HISTORY: Family History  Problem Relation Age of Onset  . Heart disease Mother   . Hypertension Mother   . Heart disease Sister   . Hypertension Sister   . Heart disease Brother   . Hypertension Daughter     ALLERGIES:  has No Known Allergies.  MEDICATIONS:  Current Outpatient Prescriptions  Medication Sig Dispense Refill  . acetaminophen (TYLENOL) 500 MG tablet Take 500 mg by mouth every 6 (six) hours as needed for fever.    Marland Kitchen albuterol (PROVENTIL HFA;VENTOLIN HFA) 108 (90 BASE) MCG/ACT inhaler Inhale 1-2 puffs into the lungs every 6 (six) hours as needed for wheezing or shortness of breath.    Marland Kitchen aspirin EC 81 MG tablet Take 81 mg by mouth daily.    . feeding supplement (BOOST / RESOURCE BREEZE) LIQD Take 1 Container by mouth 3 (three) times daily between meals.  0  . lidocaine-prilocaine (EMLA) cream Apply to affected area once (Patient taking differently: Apply 1 application topically daily as needed (For port-a-cath.). Apply to affected  area once) 30 g 3  . mirtazapine (REMERON) 15 MG tablet Take 15 mg by mouth at bedtime.     Marland Kitchen morphine (ROXANOL) 20 MG/ML concentrated solution Take 0.25 mLs (5 mg total) by mouth every 6 (six) hours as needed for severe pain. 30 mL 0  . ondansetron (ZOFRAN) 8 MG tablet 1 TABLET TWICE A DAY STARTING THE DAY AFTER CHEMO FOR 2 DAYS THEN AS NEEDED FOR NAUSEA OR VOMITING  1  . phenytoin (DILANTIN) 100 MG ER capsule Take 100-200 mg by mouth 2 (two) times daily. He takes one capsule in the morning and two capsules at bedtime.    . pravastatin (PRAVACHOL) 40 MG tablet Take 40 mg by mouth every evening.     Marland Kitchen PRESCRIPTION MEDICATION He receives his chemo treatments at the [facility] with Dr. Burr Medico. He is on a 14 day cycle of FOLFOX.    Marland Kitchen  terazosin (HYTRIN) 10 MG capsule Take 10 mg by mouth at bedtime.   0   No current facility-administered medications for this visit.    REVIEW OF SYSTEMS:   Constitutional: Denies fevers, chills or abnormal night sweats, (+) weight loss  Eyes: Denies blurriness of vision, double vision or watery eyes Ears, nose, mouth, throat, and face: Denies mucositis or sore throat Respiratory: Denies cough, dyspnea or wheezes Cardiovascular: Denies palpitation, chest discomfort or lower extremity swelling Gastrointestinal:  (+) Dysphagia, mild heartburn, Denies nausea, vomiting or change in bowel habits Skin: Denies abnormal skin rashes Lymphatics: Denies new lymphadenopathy or easy bruising Neurological:Denies numbness, tingling or new weaknesses Behavioral/Psych: Mood is stable, no new changes  All other systems were reviewed with the patient and are negative.  PHYSICAL EXAMINATION: ECOG PERFORMANCE STATUS: 1 - Symptomatic but completely ambulatory  Filed Vitals:   04/05/15 1018  BP: 137/69  Pulse: 94  Temp: 98.5 F (36.9 C)  Resp: 18   Filed Weights   04/05/15 1018  Weight: 135 lb 14.4 oz (61.644 kg)    GENERAL:alert, no distress and comfortable SKIN: skin color, texture, turgor are normal, no rashes or significant lesions EYES: normal, conjunctiva are pink and non-injected, sclera clear OROPHARYNX:no exudate, no erythema and lips, buccal mucosa, and tongue normal  NECK: supple, thyroid normal size, non-tender, without nodularity LYMPH:  no palpable lymphadenopathy in the cervical, axillary or inguinal LUNGS: clear to auscultation and percussion with normal breathing effort HEART: regular rate & rhythm and no murmurs and no lower extremity edema ABDOMEN:abdomen soft, non-tender and normal bowel sounds Musculoskeletal:no cyanosis of digits and no clubbing  PSYCH: alert & oriented x 3 with fluent speech NEURO: no focal motor/sensory deficits  LABORATORY DATA:  I have reviewed the  data as listed CBC Latest Ref Rng 04/05/2015 03/26/2015 03/25/2015  WBC 4.0 - 10.3 10e3/uL 4.9 5.1 5.6  Hemoglobin 13.0 - 17.1 g/dL 8.5(L) 8.9(L) 9.0(L)  Hematocrit 38.4 - 49.9 % 25.9(L) 26.2(L) 26.7(L)  Platelets 140 - 400 10e3/uL 270 445(H) 444(H)    CMP Latest Ref Rng 04/05/2015 03/26/2015 03/25/2015  Glucose 70 - 140 mg/dl 150(H) 103(H) 104(H)  BUN 7.0 - 26.0 mg/dL 7.$RemoveBefo'9 6 7  'isrOtNGtIbH$ Creatinine 0.7 - 1.3 mg/dL 0.7 0.45(L) 0.47(L)  Sodium 136 - 145 mEq/L 143 138 139  Potassium 3.5 - 5.1 mEq/L 3.9 3.6 3.8  Chloride 101 - 111 mmol/L - 103 103  CO2 22 - 29 mEq/L $Remove'29 28 27  'VnRUdWT$ Calcium 8.4 - 10.4 mg/dL 8.6 7.8(L) 7.8(L)  Total Protein 6.4 - 8.3 g/dL 6.8 - -  Total Bilirubin 0.20 - 1.20 mg/dL <0.30 - -  Alkaline Phos 40 - 150 U/L 174(H) - -  AST 5 - 34 U/L 26 - -  ALT 0 - 55 U/L 16 - -   Pathology report  Diagnosis 01/26/2015 Bone, biopsy, r iliac - 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 Microscopic Comment The metastatic carcinoma is positive by immunohistochemistry with p63, cytokeratin 903 and cytokeratin 5/6. The tumor is negative with CD56, synaptophysin, chromogranin, thyroid transcription factor-1 and S100. (JDP:gt, 01/30/15)   RADIOGRAPHIC STUDIES: I have personally reviewed the radiological images as listed and agreed with the findings in the report.  PET 01/18/2015 IMPRESSION: 1. Previously described esophageal mass is diffusely hypermetabolic. Today's study demonstrates associated hypermetabolic subcarinal and possible right hilar lymphadenopathy, several pulmonary nodules in the lungs bilaterally (largest of which are hypermetabolic), and a hypermetabolic lesion in the superior aspect of the right ilium, compatible with metastatic disease. 2. Atherosclerosis, including left main and 3 vessel coronary artery disease. 3. Additional incidental findings, as above.   ASSESSMENT & PLAN: 69 year old African-American male, with past medical history of hypertension,  history of stroke, coronary artery disease, who presents with progressive dysphasia.  1. 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 -I discussed his right iliac bone biopsy results with patient and his son, unfortunately biopsy confirmed metastatic squamous cell carcinoma. -We reviewed the natural course of esophagitis cancer, and the incurable nature of his disease. We emphasized the goal of therapy is palliation and prolonged his life. -He has completed a short course of palliative radiation  -He is on first-line FOLFOX. -he has recovered well from his recent episode of hospitalization, lab reviewed, adequate for treatment, we'll proceed with cycle 3 FOLFOX today -Due to the prior neutropenia, I'll reduce his 5-FU dose slightly.  -he will follow up dietician Pamala Hurry    2. Dysphagia and weight loss -secondary to #1 -follow up with dietician, I encourage him to drink more nutrition supplement  -improved, he can about 10 pounds in the past 2 weeks.   3. HTN, CAD, history of seizure and CVA -Follow up with primary care physician -we discussed that we will watch his BP closely, and may need adjust his meds during chemo   4. Chest pain, secondary to his esophageal cancer and radiation -Much improved -Continue morphine as needed    Plan -third cycle FOLFOX today, no 5-fu bolus or neulasta, 5-fu dose reduce to $RemoveB'2200mg'ioJUECky$ /m2  -he will be back to see me or Lattie Haw in 2 weeks and chemo    All questions were answered. The patient knows to call the clinic with any problems, questions or concerns.  I spent 20 minutes counseling the patient face to face. The total time spent in the appointment was 25 minutes and more than 50% was on counseling.     Truitt Merle, MD 04/05/2015 10:59 AM

## 2015-04-05 NOTE — Progress Notes (Signed)
Nutrition follow-up completed with patient during infusion. Weight has improved and was documented as 135.9 pounds November 17. Patient reports no difficulty eating and states he is eating well. Patient reports he continues to drink 3 boost plus daily. Denies nutrition impact symptoms.  Nutrition diagnosis: Unintended weight loss resolved.  Patient should continue frequent meals with snacks and boost +3 times a day Patient's goal is weight stabilization or weight gain.

## 2015-04-05 NOTE — Patient Instructions (Signed)
Leucovorin injection What is this medicine? LEUCOVORIN (loo koe VOR in) is used to prevent or treat the harmful effects of some medicines. This medicine is used to treat anemia caused by a low amount of folic acid in the body. It is also used with 5-fluorouracil (5-FU) to treat colon cancer. This medicine may be used for other purposes; ask your health care provider or pharmacist if you have questions. What should I tell my health care provider before I take this medicine? They need to know if you have any of these conditions: -anemia from low levels of vitamin B-12 in the blood -an unusual or allergic reaction to leucovorin, folic acid, other medicines, foods, dyes, or preservatives -pregnant or trying to get pregnant -breast-feeding How should I use this medicine? This medicine is for injection into a muscle or into a vein. It is given by a health care professional in a hospital or clinic setting. Talk to your pediatrician regarding the use of this medicine in children. Special care may be needed. Overdosage: If you think you have taken too much of this medicine contact a poison control center or emergency room at once. NOTE: This medicine is only for you. Do not share this medicine with others. What if I miss a dose? This does not apply. What may interact with this medicine? -capecitabine -fluorouracil -phenobarbital -phenytoin -primidone -trimethoprim-sulfamethoxazole This list may not describe all possible interactions. Give your health care provider a list of all the medicines, herbs, non-prescription drugs, or dietary supplements you use. Also tell them if you smoke, drink alcohol, or use illegal drugs. Some items may interact with your medicine. What should I watch for while using this medicine? Your condition will be monitored carefully while you are receiving this medicine. This medicine may increase the side effects of 5-fluorouracil, 5-FU. Tell your doctor or health care  professional if you have diarrhea or mouth sores that do not get better or that get worse. What side effects may I notice from receiving this medicine? Side effects that you should report to your doctor or health care professional as soon as possible: -allergic reactions like skin rash, itching or hives, swelling of the face, lips, or tongue -breathing problems -fever, infection -mouth sores -unusual bleeding or bruising -unusually weak or tired Side effects that usually do not require medical attention (report to your doctor or health care professional if they continue or are bothersome): -constipation or diarrhea -loss of appetite -nausea, vomiting This list may not describe all possible side effects. Call your doctor for medical advice about side effects. You may report side effects to FDA at 1-800-FDA-1088. Where should I keep my medicine? This drug is given in a hospital or clinic and will not be stored at home. NOTE: This sheet is a summary. It may not cover all possible information. If you have questions about this medicine, talk to your doctor, pharmacist, or health care provider.    2016, [employer]. (2007-11-09 16:50:29) Oxaliplatin Injection What is this medicine? OXALIPLATIN (ox AL i PLA tin) is a chemotherapy drug. It targets fast dividing cells, like cancer cells, and causes these cells to die. This medicine is used to treat cancers of the colon and rectum, and many other cancers. This medicine may be used for other purposes; ask your health care provider or pharmacist if you have questions. What should I tell my health care provider before I take this medicine? They need to know if you have any of these conditions: -kidney disease -an   unusual or allergic reaction to oxaliplatin, other chemotherapy, other medicines, foods, dyes, or preservatives -pregnant or trying to get pregnant -breast-feeding How should I use this medicine? This drug is given as an  infusion into a vein. It is administered in a hospital or clinic by a specially trained health care professional. Talk to your pediatrician regarding the use of this medicine in children. Special care may be needed. Overdosage: If you think you have taken too much of this medicine contact a poison control center or emergency room at once. NOTE: This medicine is only for you. Do not share this medicine with others. What if I miss a dose? It is important not to miss a dose. Call your doctor or health care professional if you are unable to keep an appointment. What may interact with this medicine? -medicines to increase blood counts like filgrastim, pegfilgrastim, sargramostim -probenecid -some antibiotics like amikacin, gentamicin, neomycin, polymyxin B, streptomycin, tobramycin -zalcitabine Talk to your doctor or health care professional before taking any of these medicines: -acetaminophen -aspirin -ibuprofen -ketoprofen -naproxen This list may not describe all possible interactions. Give your health care provider a list of all the medicines, herbs, non-prescription drugs, or dietary supplements you use. Also tell them if you smoke, drink alcohol, or use illegal drugs. Some items may interact with your medicine. What should I watch for while using this medicine? Your condition will be monitored carefully while you are receiving this medicine. You will need important blood work done while you are taking this medicine. This medicine can make you more sensitive to cold. Do not drink cold drinks or use ice. Cover exposed skin before coming in contact with cold temperatures or cold objects. When out in cold weather wear warm clothing and cover your mouth and nose to warm the air that goes into your lungs. Tell your doctor if you get sensitive to the cold. This drug may make you feel generally unwell. This is not uncommon, as chemotherapy can affect healthy cells as well as cancer cells. Report any  side effects. Continue your course of treatment even though you feel ill unless your doctor tells you to stop. In some cases, you may be given additional medicines to help with side effects. Follow all directions for their use. Call your doctor or health care professional for advice if you get a fever, chills or sore throat, or other symptoms of a cold or flu. Do not treat yourself. This drug decreases your body's ability to fight infections. Try to avoid being around people who are sick. This medicine may increase your risk to bruise or bleed. Call your doctor or health care professional if you notice any unusual bleeding. Be careful brushing and flossing your teeth or using a toothpick because you may get an infection or bleed more easily. If you have any dental work done, tell your dentist you are receiving this medicine. Avoid taking products that contain aspirin, acetaminophen, ibuprofen, naproxen, or ketoprofen unless instructed by your doctor. These medicines may hide a fever. Do not become pregnant while taking this medicine. Women should inform their doctor if they wish to become pregnant or think they might be pregnant. There is a potential for serious side effects to an unborn child. Talk to your health care professional or pharmacist for more information. Do not breast-feed an infant while taking this medicine. Call your doctor or health care professional if you get diarrhea. Do not treat yourself. What side effects may I notice from receiving this medicine?   Side effects that you should report to your doctor or health care professional as soon as possible: -allergic reactions like skin rash, itching or hives, swelling of the face, lips, or tongue -low blood counts - This drug may decrease the number of white blood cells, red blood cells and platelets. You may be at increased risk for infections and bleeding. -signs of infection - fever or chills, cough, sore throat, pain or difficulty passing  urine -signs of decreased platelets or bleeding - bruising, pinpoint red spots on the skin, black, tarry stools, nosebleeds -signs of decreased red blood cells - unusually weak or tired, fainting spells, lightheadedness -breathing problems -chest pain, pressure -cough -diarrhea -jaw tightness -mouth sores -nausea and vomiting -pain, swelling, redness or irritation at the injection site -pain, tingling, numbness in the hands or feet -problems with balance, talking, walking -redness, blistering, peeling or loosening of the skin, including inside the mouth -trouble passing urine or change in the amount of urine Side effects that usually do not require medical attention (report to your doctor or health care professional if they continue or are bothersome): -changes in vision -constipation -hair loss -loss of appetite -metallic taste in the mouth or changes in taste -stomach pain This list may not describe all possible side effects. Call your doctor for medical advice about side effects. You may report side effects to FDA at 1-800-FDA-1088. Where should I keep my medicine? This drug is given in a hospital or clinic and will not be stored at home. NOTE: This sheet is a summary. It may not cover all possible information. If you have questions about this medicine, talk to your doctor, pharmacist, or health care provider.    2016, [employer]. (2007-11-30 17:22:47) Fluorouracil, 5-FU injection What is this medicine? FLUOROURACIL, 5-FU (flure oh YOOR a sil) is a chemotherapy drug. It slows the growth of cancer cells. This medicine is used to treat many types of cancer like breast cancer, colon or rectal cancer, pancreatic cancer, and stomach cancer. This medicine may be used for other purposes; ask your health care provider or pharmacist if you have questions. What should I tell my health care provider before I take this medicine? They need to know if you have any of these  conditions: -blood disorders -dihydropyrimidine dehydrogenase (DPD) deficiency -infection (especially a virus infection such as chickenpox, cold sores, or herpes) -kidney disease -liver disease -malnourished, poor nutrition -recent or ongoing radiation therapy -an unusual or allergic reaction to fluorouracil, other chemotherapy, other medicines, foods, dyes, or preservatives -pregnant or trying to get pregnant -breast-feeding How should I use this medicine? This drug is given as an infusion or injection into a vein. It is administered in a hospital or clinic by a specially trained health care professional. Talk to your pediatrician regarding the use of this medicine in children. Special care may be needed. Overdosage: If you think you have taken too much of this medicine contact a poison control center or emergency room at once. NOTE: This medicine is only for you. Do not share this medicine with others. What if I miss a dose? It is important not to miss your dose. Call your doctor or health care professional if you are unable to keep an appointment. What may interact with this medicine? -allopurinol -cimetidine -dapsone -digoxin -hydroxyurea -leucovorin -levamisole -medicines for seizures like ethotoin, fosphenytoin, phenytoin -medicines to increase blood counts like filgrastim, pegfilgrastim, sargramostim -medicines that treat or prevent blood clots like warfarin, enoxaparin, and dalteparin -methotrexate -metronidazole -pyrimethamine -some other   chemotherapy drugs like busulfan, cisplatin, estramustine, vinblastine -trimethoprim -trimetrexate -vaccines Talk to your doctor or health care professional before taking any of these medicines: -acetaminophen -aspirin -ibuprofen -ketoprofen -naproxen This list may not describe all possible interactions. Give your health care provider a list of all the medicines, herbs, non-prescription drugs, or dietary supplements you use. Also  tell them if you smoke, drink alcohol, or use illegal drugs. Some items may interact with your medicine. What should I watch for while using this medicine? Visit your doctor for checks on your progress. This drug may make you feel generally unwell. This is not uncommon, as chemotherapy can affect healthy cells as well as cancer cells. Report any side effects. Continue your course of treatment even though you feel ill unless your doctor tells you to stop. In some cases, you may be given additional medicines to help with side effects. Follow all directions for their use. Call your doctor or health care professional for advice if you get a fever, chills or sore throat, or other symptoms of a cold or flu. Do not treat yourself. This drug decreases your body's ability to fight infections. Try to avoid being around people who are sick. This medicine may increase your risk to bruise or bleed. Call your doctor or health care professional if you notice any unusual bleeding. Be careful brushing and flossing your teeth or using a toothpick because you may get an infection or bleed more easily. If you have any dental work done, tell your dentist you are receiving this medicine. Avoid taking products that contain aspirin, acetaminophen, ibuprofen, naproxen, or ketoprofen unless instructed by your doctor. These medicines may hide a fever. Do not become pregnant while taking this medicine. Women should inform their doctor if they wish to become pregnant or think they might be pregnant. There is a potential for serious side effects to an unborn child. Talk to your health care professional or pharmacist for more information. Do not breast-feed an infant while taking this medicine. Men should inform their doctor if they wish to father a child. This medicine may lower sperm counts. Do not treat diarrhea with over the counter products. Contact your doctor if you have diarrhea that lasts more than 2 days or if it is severe and  watery. This medicine can make you more sensitive to the sun. Keep out of the sun. If you cannot avoid being in the sun, wear protective clothing and use sunscreen. Do not use sun lamps or tanning beds/booths. What side effects may I notice from receiving this medicine? Side effects that you should report to your doctor or health care professional as soon as possible: -allergic reactions like skin rash, itching or hives, swelling of the face, lips, or tongue -low blood counts - this medicine may decrease the number of white blood cells, red blood cells and platelets. You may be at increased risk for infections and bleeding. -signs of infection - fever or chills, cough, sore throat, pain or difficulty passing urine -signs of decreased platelets or bleeding - bruising, pinpoint red spots on the skin, black, tarry stools, blood in the urine -signs of decreased red blood cells - unusually weak or tired, fainting spells, lightheadedness -breathing problems -changes in vision -chest pain -mouth sores -nausea and vomiting -pain, swelling, redness at site where injected -pain, tingling, numbness in the hands or feet -redness, swelling, or sores on hands or feet -stomach pain -unusual bleeding Side effects that usually do not require medical attention (report to your   doctor or health care professional if they continue or are bothersome): -changes in finger or toe nails -diarrhea -dry or itchy skin -hair loss -headache -loss of appetite -sensitivity of eyes to the light -stomach upset -unusually teary eyes This list may not describe all possible side effects. Call your doctor for medical advice about side effects. You may report side effects to FDA at 1-800-FDA-1088. Where should I keep my medicine? This drug is given in a hospital or clinic and will not be stored at home. NOTE: This sheet is a summary. It may not cover all possible information. If you have questions about this medicine, talk  to your doctor, pharmacist, or health care provider.    2016, [employer]. (2007-09-08 13:53:16)  

## 2015-04-07 ENCOUNTER — Ambulatory Visit (HOSPITAL_BASED_OUTPATIENT_CLINIC_OR_DEPARTMENT_OTHER): Payer: Medicare Other

## 2015-04-07 VITALS — BP 116/64 | HR 95 | Temp 98.2°F | Resp 20

## 2015-04-07 DIAGNOSIS — C155 Malignant neoplasm of lower third of esophagus: Secondary | ICD-10-CM | POA: Diagnosis not present

## 2015-04-07 MED ORDER — HEPARIN SOD (PORK) LOCK FLUSH 100 UNIT/ML IV SOLN
500.0000 [IU] | Freq: Once | INTRAVENOUS | Status: AC | PRN
  Administered 2015-04-07: 500 [IU]
  Filled 2015-04-07: qty 5

## 2015-04-07 MED ORDER — HEPARIN SOD (PORK) LOCK FLUSH 100 UNIT/ML IV SOLN
250.0000 [IU] | Freq: Once | INTRAVENOUS | Status: DC | PRN
  Filled 2015-04-07: qty 5

## 2015-04-07 MED ORDER — SODIUM CHLORIDE 0.9 % IJ SOLN
10.0000 mL | INTRAMUSCULAR | Status: DC | PRN
  Administered 2015-04-07: 10 mL
  Filled 2015-04-07: qty 10

## 2015-04-07 NOTE — Patient Instructions (Signed)

## 2015-04-10 ENCOUNTER — Other Ambulatory Visit: Payer: Self-pay | Admitting: *Deleted

## 2015-04-11 ENCOUNTER — Telehealth: Payer: Self-pay | Admitting: Hematology

## 2015-04-11 ENCOUNTER — Telehealth: Payer: Self-pay | Admitting: *Deleted

## 2015-04-11 NOTE — Telephone Encounter (Signed)
per pof tos ch pt appt-sent MW email to sch trmt-will call pt after reply °

## 2015-04-11 NOTE — Telephone Encounter (Signed)
Per staff message and POF I have scheduled appts. Advised scheduler of appts and advised no available on 11/30 JMW

## 2015-04-13 ENCOUNTER — Telehealth: Payer: Self-pay | Admitting: Hematology

## 2015-04-13 ENCOUNTER — Telehealth: Payer: Self-pay | Admitting: *Deleted

## 2015-04-13 NOTE — Telephone Encounter (Signed)
per pof to sch pt appt-cld & spoke to Judy(spouse) and gave appt time & date

## 2015-04-13 NOTE — Telephone Encounter (Signed)
Per staff message and POF I have scheduled appts. Advised scheduler of appts. JMW  

## 2015-04-17 DIAGNOSIS — R7309 Other abnormal glucose: Secondary | ICD-10-CM | POA: Diagnosis not present

## 2015-04-17 DIAGNOSIS — Z0000 Encounter for general adult medical examination without abnormal findings: Secondary | ICD-10-CM | POA: Diagnosis not present

## 2015-04-17 DIAGNOSIS — E291 Testicular hypofunction: Secondary | ICD-10-CM | POA: Diagnosis not present

## 2015-04-17 DIAGNOSIS — Z13820 Encounter for screening for osteoporosis: Secondary | ICD-10-CM | POA: Diagnosis not present

## 2015-04-17 DIAGNOSIS — I129 Hypertensive chronic kidney disease with stage 1 through stage 4 chronic kidney disease, or unspecified chronic kidney disease: Secondary | ICD-10-CM | POA: Diagnosis not present

## 2015-04-17 DIAGNOSIS — N182 Chronic kidney disease, stage 2 (mild): Secondary | ICD-10-CM | POA: Diagnosis not present

## 2015-04-17 DIAGNOSIS — J449 Chronic obstructive pulmonary disease, unspecified: Secondary | ICD-10-CM | POA: Diagnosis not present

## 2015-04-18 ENCOUNTER — Ambulatory Visit (HOSPITAL_BASED_OUTPATIENT_CLINIC_OR_DEPARTMENT_OTHER): Payer: Medicare Other

## 2015-04-18 ENCOUNTER — Other Ambulatory Visit: Payer: Self-pay | Admitting: Nurse Practitioner

## 2015-04-18 ENCOUNTER — Encounter: Payer: Self-pay | Admitting: Hematology

## 2015-04-18 ENCOUNTER — Telehealth: Payer: Self-pay | Admitting: *Deleted

## 2015-04-18 ENCOUNTER — Telehealth: Payer: Self-pay | Admitting: Hematology

## 2015-04-18 ENCOUNTER — Other Ambulatory Visit (HOSPITAL_BASED_OUTPATIENT_CLINIC_OR_DEPARTMENT_OTHER): Payer: Medicare Other

## 2015-04-18 ENCOUNTER — Ambulatory Visit (HOSPITAL_BASED_OUTPATIENT_CLINIC_OR_DEPARTMENT_OTHER): Payer: Medicare Other | Admitting: Hematology

## 2015-04-18 VITALS — BP 113/54 | HR 83 | Temp 98.2°F | Resp 18 | Ht 68.0 in | Wt 139.1 lb

## 2015-04-18 DIAGNOSIS — C155 Malignant neoplasm of lower third of esophagus: Secondary | ICD-10-CM | POA: Diagnosis not present

## 2015-04-18 DIAGNOSIS — D6481 Anemia due to antineoplastic chemotherapy: Secondary | ICD-10-CM | POA: Diagnosis not present

## 2015-04-18 DIAGNOSIS — G893 Neoplasm related pain (acute) (chronic): Secondary | ICD-10-CM | POA: Diagnosis not present

## 2015-04-18 DIAGNOSIS — R1310 Dysphagia, unspecified: Secondary | ICD-10-CM

## 2015-04-18 DIAGNOSIS — D649 Anemia, unspecified: Secondary | ICD-10-CM

## 2015-04-18 DIAGNOSIS — C159 Malignant neoplasm of esophagus, unspecified: Secondary | ICD-10-CM

## 2015-04-18 DIAGNOSIS — Z5111 Encounter for antineoplastic chemotherapy: Secondary | ICD-10-CM | POA: Diagnosis not present

## 2015-04-18 LAB — CBC & DIFF AND RETIC
BASO%: 0.5 % (ref 0.0–2.0)
BASOS ABS: 0 10*3/uL (ref 0.0–0.1)
EOS%: 7.8 % — ABNORMAL HIGH (ref 0.0–7.0)
Eosinophils Absolute: 0.3 10*3/uL (ref 0.0–0.5)
HEMATOCRIT: 28.4 % — AB (ref 38.4–49.9)
HEMOGLOBIN: 9.4 g/dL — AB (ref 13.0–17.1)
IMMATURE RETIC FRACT: 11.1 % — AB (ref 3.00–10.60)
LYMPH#: 0.8 10*3/uL — AB (ref 0.9–3.3)
LYMPH%: 19.4 % (ref 14.0–49.0)
MCH: 29.4 pg (ref 27.2–33.4)
MCHC: 33.1 g/dL (ref 32.0–36.0)
MCV: 88.8 fL (ref 79.3–98.0)
MONO#: 0.6 10*3/uL (ref 0.1–0.9)
MONO%: 15 % — ABNORMAL HIGH (ref 0.0–14.0)
NEUT#: 2.2 10*3/uL (ref 1.5–6.5)
NEUT%: 57.3 % (ref 39.0–75.0)
PLATELETS: 171 10*3/uL (ref 140–400)
RBC: 3.2 10*6/uL — AB (ref 4.20–5.82)
RDW: 19.3 % — ABNORMAL HIGH (ref 11.0–14.6)
RETIC CT ABS: 89.28 10*3/uL (ref 34.80–93.90)
Retic %: 2.79 % — ABNORMAL HIGH (ref 0.80–1.80)
WBC: 3.9 10*3/uL — ABNORMAL LOW (ref 4.0–10.3)

## 2015-04-18 LAB — COMPREHENSIVE METABOLIC PANEL (CC13)
ALT: 12 U/L (ref 0–55)
ANION GAP: 8 meq/L (ref 3–11)
AST: 23 U/L (ref 5–34)
Albumin: 2.5 g/dL — ABNORMAL LOW (ref 3.5–5.0)
Alkaline Phosphatase: 167 U/L — ABNORMAL HIGH (ref 40–150)
BUN: 10.3 mg/dL (ref 7.0–26.0)
CHLORIDE: 104 meq/L (ref 98–109)
CO2: 30 meq/L — AB (ref 22–29)
CREATININE: 0.8 mg/dL (ref 0.7–1.3)
Calcium: 8.8 mg/dL (ref 8.4–10.4)
EGFR: >90 mL/min/{1.73_m2} (ref >90)
Glucose: 120 mg/dl (ref 70–140)
Potassium: 4.4 mEq/L (ref 3.5–5.1)
SODIUM: 141 meq/L (ref 136–145)
Total Bilirubin: <0.3 mg/dL (ref 0.20–1.20)
Total Protein: 7.6 g/dL (ref 6.4–8.3)

## 2015-04-18 LAB — IRON AND TIBC CHCC
%SAT: 30 % (ref 20–55)
IRON: 58 ug/dL (ref 42–163)
TIBC: 194 ug/dL — ABNORMAL LOW (ref 202–409)
UIBC: 135 ug/dL (ref 117–376)

## 2015-04-18 LAB — FERRITIN CHCC: Ferritin: 165 ng/ml (ref 22–316)

## 2015-04-18 MED ORDER — SODIUM CHLORIDE 0.9 % IJ SOLN
10.0000 mL | INTRAMUSCULAR | Status: DC | PRN
  Filled 2015-04-18: qty 10

## 2015-04-18 MED ORDER — SODIUM CHLORIDE 0.9 % IV SOLN
Freq: Once | INTRAVENOUS | Status: AC
  Administered 2015-04-18: 14:00:00 via INTRAVENOUS
  Filled 2015-04-18: qty 4

## 2015-04-18 MED ORDER — DEXTROSE 5 % IV SOLN
400.0000 mg/m2 | Freq: Once | INTRAVENOUS | Status: AC
  Administered 2015-04-18: 676 mg via INTRAVENOUS
  Filled 2015-04-18: qty 33.8

## 2015-04-18 MED ORDER — SODIUM CHLORIDE 0.9 % IV SOLN
2200.0000 mg/m2 | INTRAVENOUS | Status: DC
  Administered 2015-04-18: 3700 mg via INTRAVENOUS
  Filled 2015-04-18: qty 74

## 2015-04-18 MED ORDER — HEPARIN SOD (PORK) LOCK FLUSH 100 UNIT/ML IV SOLN
500.0000 [IU] | Freq: Once | INTRAVENOUS | Status: DC | PRN
  Filled 2015-04-18: qty 5

## 2015-04-18 MED ORDER — DEXTROSE 5 % IV SOLN
Freq: Once | INTRAVENOUS | Status: AC
  Administered 2015-04-18: 13:00:00 via INTRAVENOUS

## 2015-04-18 MED ORDER — OXALIPLATIN CHEMO INJECTION 100 MG/20ML
85.0000 mg/m2 | Freq: Once | INTRAVENOUS | Status: AC
  Administered 2015-04-18: 145 mg via INTRAVENOUS
  Filled 2015-04-18: qty 29

## 2015-04-18 NOTE — Patient Instructions (Signed)
Leucovorin injection What is this medicine? LEUCOVORIN (loo koe VOR in) is used to prevent or treat the harmful effects of some medicines. This medicine is used to treat anemia caused by a low amount of folic acid in the body. It is also used with 5-fluorouracil (5-FU) to treat colon cancer. This medicine may be used for other purposes; ask your health care provider or pharmacist if you have questions. What should I tell my health care provider before I take this medicine? They need to know if you have any of these conditions: -anemia from low levels of vitamin B-12 in the blood -an unusual or allergic reaction to leucovorin, folic acid, other medicines, foods, dyes, or preservatives -pregnant or trying to get pregnant -breast-feeding How should I use this medicine? This medicine is for injection into a muscle or into a vein. It is given by a health care professional in a hospital or clinic setting. Talk to your pediatrician regarding the use of this medicine in children. Special care may be needed. Overdosage: If you think you have taken too much of this medicine contact a poison control center or emergency room at once. NOTE: This medicine is only for you. Do not share this medicine with others. What if I miss a dose? This does not apply. What may interact with this medicine? -capecitabine -fluorouracil -phenobarbital -phenytoin -primidone -trimethoprim-sulfamethoxazole This list may not describe all possible interactions. Give your health care provider a list of all the medicines, herbs, non-prescription drugs, or dietary supplements you use. Also tell them if you smoke, drink alcohol, or use illegal drugs. Some items may interact with your medicine. What should I watch for while using this medicine? Your condition will be monitored carefully while you are receiving this medicine. This medicine may increase the side effects of 5-fluorouracil, 5-FU. Tell your doctor or health care  professional if you have diarrhea or mouth sores that do not get better or that get worse. What side effects may I notice from receiving this medicine? Side effects that you should report to your doctor or health care professional as soon as possible: -allergic reactions like skin rash, itching or hives, swelling of the face, lips, or tongue -breathing problems -fever, infection -mouth sores -unusual bleeding or bruising -unusually weak or tired Side effects that usually do not require medical attention (report to your doctor or health care professional if they continue or are bothersome): -constipation or diarrhea -loss of appetite -nausea, vomiting This list may not describe all possible side effects. Call your doctor for medical advice about side effects. You may report side effects to FDA at 1-800-FDA-1088. Where should I keep my medicine? This drug is given in a hospital or clinic and will not be stored at home. NOTE: This sheet is a summary. It may not cover all possible information. If you have questions about this medicine, talk to your doctor, pharmacist, or health care provider.    2016, [employer]. (2007-11-09 16:50:29) Oxaliplatin Injection What is this medicine? OXALIPLATIN (ox AL i PLA tin) is a chemotherapy drug. It targets fast dividing cells, like cancer cells, and causes these cells to die. This medicine is used to treat cancers of the colon and rectum, and many other cancers. This medicine may be used for other purposes; ask your health care provider or pharmacist if you have questions. What should I tell my health care provider before I take this medicine? They need to know if you have any of these conditions: -kidney disease -an   unusual or allergic reaction to oxaliplatin, other chemotherapy, other medicines, foods, dyes, or preservatives -pregnant or trying to get pregnant -breast-feeding How should I use this medicine? This drug is given as an  infusion into a vein. It is administered in a hospital or clinic by a specially trained health care professional. Talk to your pediatrician regarding the use of this medicine in children. Special care may be needed. Overdosage: If you think you have taken too much of this medicine contact a poison control center or emergency room at once. NOTE: This medicine is only for you. Do not share this medicine with others. What if I miss a dose? It is important not to miss a dose. Call your doctor or health care professional if you are unable to keep an appointment. What may interact with this medicine? -medicines to increase blood counts like filgrastim, pegfilgrastim, sargramostim -probenecid -some antibiotics like amikacin, gentamicin, neomycin, polymyxin B, streptomycin, tobramycin -zalcitabine Talk to your doctor or health care professional before taking any of these medicines: -acetaminophen -aspirin -ibuprofen -ketoprofen -naproxen This list may not describe all possible interactions. Give your health care provider a list of all the medicines, herbs, non-prescription drugs, or dietary supplements you use. Also tell them if you smoke, drink alcohol, or use illegal drugs. Some items may interact with your medicine. What should I watch for while using this medicine? Your condition will be monitored carefully while you are receiving this medicine. You will need important blood work done while you are taking this medicine. This medicine can make you more sensitive to cold. Do not drink cold drinks or use ice. Cover exposed skin before coming in contact with cold temperatures or cold objects. When out in cold weather wear warm clothing and cover your mouth and nose to warm the air that goes into your lungs. Tell your doctor if you get sensitive to the cold. This drug may make you feel generally unwell. This is not uncommon, as chemotherapy can affect healthy cells as well as cancer cells. Report any  side effects. Continue your course of treatment even though you feel ill unless your doctor tells you to stop. In some cases, you may be given additional medicines to help with side effects. Follow all directions for their use. Call your doctor or health care professional for advice if you get a fever, chills or sore throat, or other symptoms of a cold or flu. Do not treat yourself. This drug decreases your body's ability to fight infections. Try to avoid being around people who are sick. This medicine may increase your risk to bruise or bleed. Call your doctor or health care professional if you notice any unusual bleeding. Be careful brushing and flossing your teeth or using a toothpick because you may get an infection or bleed more easily. If you have any dental work done, tell your dentist you are receiving this medicine. Avoid taking products that contain aspirin, acetaminophen, ibuprofen, naproxen, or ketoprofen unless instructed by your doctor. These medicines may hide a fever. Do not become pregnant while taking this medicine. Women should inform their doctor if they wish to become pregnant or think they might be pregnant. There is a potential for serious side effects to an unborn child. Talk to your health care professional or pharmacist for more information. Do not breast-feed an infant while taking this medicine. Call your doctor or health care professional if you get diarrhea. Do not treat yourself. What side effects may I notice from receiving this medicine?   Side effects that you should report to your doctor or health care professional as soon as possible: -allergic reactions like skin rash, itching or hives, swelling of the face, lips, or tongue -low blood counts - This drug may decrease the number of white blood cells, red blood cells and platelets. You may be at increased risk for infections and bleeding. -signs of infection - fever or chills, cough, sore throat, pain or difficulty passing  urine -signs of decreased platelets or bleeding - bruising, pinpoint red spots on the skin, black, tarry stools, nosebleeds -signs of decreased red blood cells - unusually weak or tired, fainting spells, lightheadedness -breathing problems -chest pain, pressure -cough -diarrhea -jaw tightness -mouth sores -nausea and vomiting -pain, swelling, redness or irritation at the injection site -pain, tingling, numbness in the hands or feet -problems with balance, talking, walking -redness, blistering, peeling or loosening of the skin, including inside the mouth -trouble passing urine or change in the amount of urine Side effects that usually do not require medical attention (report to your doctor or health care professional if they continue or are bothersome): -changes in vision -constipation -hair loss -loss of appetite -metallic taste in the mouth or changes in taste -stomach pain This list may not describe all possible side effects. Call your doctor for medical advice about side effects. You may report side effects to FDA at 1-800-FDA-1088. Where should I keep my medicine? This drug is given in a hospital or clinic and will not be stored at home. NOTE: This sheet is a summary. It may not cover all possible information. If you have questions about this medicine, talk to your doctor, pharmacist, or health care provider.    2016, [employer]. (2007-11-30 17:22:47) Fluorouracil, 5-FU injection What is this medicine? FLUOROURACIL, 5-FU (flure oh YOOR a sil) is a chemotherapy drug. It slows the growth of cancer cells. This medicine is used to treat many types of cancer like breast cancer, colon or rectal cancer, pancreatic cancer, and stomach cancer. This medicine may be used for other purposes; ask your health care provider or pharmacist if you have questions. What should I tell my health care provider before I take this medicine? They need to know if you have any of these  conditions: -blood disorders -dihydropyrimidine dehydrogenase (DPD) deficiency -infection (especially a virus infection such as chickenpox, cold sores, or herpes) -kidney disease -liver disease -malnourished, poor nutrition -recent or ongoing radiation therapy -an unusual or allergic reaction to fluorouracil, other chemotherapy, other medicines, foods, dyes, or preservatives -pregnant or trying to get pregnant -breast-feeding How should I use this medicine? This drug is given as an infusion or injection into a vein. It is administered in a hospital or clinic by a specially trained health care professional. Talk to your pediatrician regarding the use of this medicine in children. Special care may be needed. Overdosage: If you think you have taken too much of this medicine contact a poison control center or emergency room at once. NOTE: This medicine is only for you. Do not share this medicine with others. What if I miss a dose? It is important not to miss your dose. Call your doctor or health care professional if you are unable to keep an appointment. What may interact with this medicine? -allopurinol -cimetidine -dapsone -digoxin -hydroxyurea -leucovorin -levamisole -medicines for seizures like ethotoin, fosphenytoin, phenytoin -medicines to increase blood counts like filgrastim, pegfilgrastim, sargramostim -medicines that treat or prevent blood clots like warfarin, enoxaparin, and dalteparin -methotrexate -metronidazole -pyrimethamine -some other   chemotherapy drugs like busulfan, cisplatin, estramustine, vinblastine -trimethoprim -trimetrexate -vaccines Talk to your doctor or health care professional before taking any of these medicines: -acetaminophen -aspirin -ibuprofen -ketoprofen -naproxen This list may not describe all possible interactions. Give your health care provider a list of all the medicines, herbs, non-prescription drugs, or dietary supplements you use. Also  tell them if you smoke, drink alcohol, or use illegal drugs. Some items may interact with your medicine. What should I watch for while using this medicine? Visit your doctor for checks on your progress. This drug may make you feel generally unwell. This is not uncommon, as chemotherapy can affect healthy cells as well as cancer cells. Report any side effects. Continue your course of treatment even though you feel ill unless your doctor tells you to stop. In some cases, you may be given additional medicines to help with side effects. Follow all directions for their use. Call your doctor or health care professional for advice if you get a fever, chills or sore throat, or other symptoms of a cold or flu. Do not treat yourself. This drug decreases your body's ability to fight infections. Try to avoid being around people who are sick. This medicine may increase your risk to bruise or bleed. Call your doctor or health care professional if you notice any unusual bleeding. Be careful brushing and flossing your teeth or using a toothpick because you may get an infection or bleed more easily. If you have any dental work done, tell your dentist you are receiving this medicine. Avoid taking products that contain aspirin, acetaminophen, ibuprofen, naproxen, or ketoprofen unless instructed by your doctor. These medicines may hide a fever. Do not become pregnant while taking this medicine. Women should inform their doctor if they wish to become pregnant or think they might be pregnant. There is a potential for serious side effects to an unborn child. Talk to your health care professional or pharmacist for more information. Do not breast-feed an infant while taking this medicine. Men should inform their doctor if they wish to father a child. This medicine may lower sperm counts. Do not treat diarrhea with over the counter products. Contact your doctor if you have diarrhea that lasts more than 2 days or if it is severe and  watery. This medicine can make you more sensitive to the sun. Keep out of the sun. If you cannot avoid being in the sun, wear protective clothing and use sunscreen. Do not use sun lamps or tanning beds/booths. What side effects may I notice from receiving this medicine? Side effects that you should report to your doctor or health care professional as soon as possible: -allergic reactions like skin rash, itching or hives, swelling of the face, lips, or tongue -low blood counts - this medicine may decrease the number of white blood cells, red blood cells and platelets. You may be at increased risk for infections and bleeding. -signs of infection - fever or chills, cough, sore throat, pain or difficulty passing urine -signs of decreased platelets or bleeding - bruising, pinpoint red spots on the skin, black, tarry stools, blood in the urine -signs of decreased red blood cells - unusually weak or tired, fainting spells, lightheadedness -breathing problems -changes in vision -chest pain -mouth sores -nausea and vomiting -pain, swelling, redness at site where injected -pain, tingling, numbness in the hands or feet -redness, swelling, or sores on hands or feet -stomach pain -unusual bleeding Side effects that usually do not require medical attention (report to your   doctor or health care professional if they continue or are bothersome): -changes in finger or toe nails -diarrhea -dry or itchy skin -hair loss -headache -loss of appetite -sensitivity of eyes to the light -stomach upset -unusually teary eyes This list may not describe all possible side effects. Call your doctor for medical advice about side effects. You may report side effects to FDA at 1-800-FDA-1088. Where should I keep my medicine? This drug is given in a hospital or clinic and will not be stored at home. NOTE: This sheet is a summary. It may not cover all possible information. If you have questions about this medicine, talk  to your doctor, pharmacist, or health care provider.    2016, [employer]. (2007-09-08 13:53:16)  

## 2015-04-18 NOTE — Telephone Encounter (Signed)
per pof to sch pt appt-sent MW email to sch pt trmt-sent Dr Burr Medico email to adv sch with Heather-pt aware of appt

## 2015-04-18 NOTE — Progress Notes (Addendum)
[place]  [phone] [phone]  Clinic Follow up Note   Patient Care Team: Glendale Chard, MD as PCP - General (Internal Medicine) Adrian Prows, MD as Consulting Physician (Cardiology) Jovita Gamma, MD as Consulting Physician (Neurosurgery) Lowella Bandy, MD as Consulting Physician (Urology) Grace Isaac, MD as Consulting Physician (Cardiothoracic Surgery) Truitt Merle, MD as Consulting Physician (Hematology) Tania Ade, RN as Registered Nurse Carol Ada, MD as Consulting Physician (Gastroenterology) Gery Pray, MD as Consulting Physician (Radiation Oncology) 04/18/2015  CHIEF COMPLAINTS:  Follow up esophageal squamous cell carcinoma  Oncology History   Squamous cell esophageal cancer   Staging form: Esophagus - Squamous Cell Carcinoma, AJCC 7th Edition     Clinical: Stage IV (TX, N0, M1) - Unsigned       Squamous cell esophageal cancer ([place])   01/02/2015 Initial Diagnosis Squamous cell esophageal cancer   01/02/2015 Procedure EGD by Dr. Benson Norway showed a large, fungating mass with no bleeding and with stigmata met of recent bleeding was found in the third of the esophagus. The mass was completely obstructing and circumferential. The stomach and examined duodenum were normal.   01/02/2015 Imaging CT CAP showed 9 cm segment of irregular his social worker thickening. Single enlarged subcarinal lymph node, bilateral pulmonary nodules (3), 75mm right middle lobe next to right atrium, 3 RUL and a 7 mm lingular lung nodules.    01/02/2015 Initial Biopsy Esophageal mass biopsy showed poorly differentiated squamous cell carcinoma.   01/18/2015 Imaging PET scan showed hypermetabolic esophageal mass, subcarinal and possible right hilar adenopathy, several lung nodules, and a hypermetabolic bone lesion in right ilium, compatible with metastatic disease.    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    HISTORY OF PRESENTING ILLNESS:  Andre Guzman 69 y.o. male is here because of recently diagnosed esophageal squamous cell carcinoma.  He has had dysphagia for one month, with solid food mainly, he is able to keep liquid and soft diet down. He lost about 30 lbs in the the past month, but his weight has been stable in the past week since he started taking boost. He had some chest pain when he swallows. He is constipated, on stool softener, no bleeding.  He otherwise feels fine. His energy level is fair, he functions very well, no limitation on his physical activities. No nausea, no other pain, cough or other, he drinks about 2 boost a day, soup,and some soft diet.  He was referred to gastroenterologist Dr. Benson Norway by his primary care physician. He underwent EGD which showed a circumferential, obstructing, fungating mass in the third esophagus, biopsy showed poorly differentiated squamous cell carcinoma. CT scan chest abdomen and pelvis showed 3 lung nodules, with the largest 1.2 cm in the right middle lobe next to the right atrium.  CURRENT THERAPY: first line chemotherapy with mFOLFOX6, no 5-fu bolus, started on 02/22/15   INTERIM HISTORY Andre Guzman for follow-up. He has been doing well lately. He tolerated the last chemotherapy very well, no significant nausea, diarrhea, or other side effects. His appetite has been better, he is eating more, he gained about 20 pounds in the past one and half months. He denies significant pain, he only uses  morphine as needed wants a while. He denies any fever or chills, no neuropathy or other complaints.  MEDICAL HISTORY:  Past Medical History  Diagnosis Date  . Coronary artery disease   . Shortness  of breath   . Seizures ([place])   . Hypertension   . Pneumonia   . COPD (chronic obstructive pulmonary disease) ([place])   . Stroke ([place])   . Esophageal cancer ([place])   . Radiation 01/25/15-02/13/15    35 gray for esophageal cancer    SURGICAL HISTORY: Past Surgical History  Procedure Laterality Date  . Cardiac catheterization    . Coronary angioplasty with stent placement  07/22/2011  . Cardiac valve surgery    . Coronary artery bypass graft    . Lower extremity angiogram N/A 07/01/2011    Procedure: LOWER EXTREMITY ANGIOGRAM;  Surgeon: Laverda Page, MD;  Location: [facility] CATH LAB;  Service: Cardiovascular;  Laterality: N/A;  . Left heart catheterization with coronary angiogram N/A 07/01/2011    Procedure: LEFT HEART CATHETERIZATION WITH CORONARY ANGIOGRAM;  Surgeon: Laverda Page, MD;  Location: [facility] CATH LAB;  Service: Cardiovascular;  Laterality: N/A;  . Percutaneous coronary rotoblator intervention (pci-r) N/A 07/22/2011    Procedure: PERCUTANEOUS CORONARY ROTOBLATOR INTERVENTION (PCI-R);  Surgeon: Laverda Page, MD;  Location: [facility] CATH LAB;  Service: Cardiovascular;  Laterality: N/A;  . Left heart catheterization with coronary angiogram N/A 10/30/2011    Procedure: LEFT HEART CATHETERIZATION WITH CORONARY ANGIOGRAM;  Surgeon: Laverda Page, MD;  Location[facility] CATH LAB;  Service: Cardiovascular;  Laterality: N/A;  . Esophagogastroduodenoscopy  01/02/15    SOCIAL HISTORY: Social History   Social History  . Marital Status: Married, lives with his wife     Spouse Name: N/A  . Number of Children: 61, 1 daughter and 3 sons, all lives in [place]   . Years of Education: N/A   Occupational History  . Retired Nature conservation officer    Social History Main Topics  . Smoking status: Current Every Day Smoker -- 0.50 packs/day for 50 years    Types: Cigarettes  . Smokeless tobacco: Never Used  . 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  Drug Use: No  . Sexual Activity: Not on file     Comment: did not ask   Other Topics Concern  . Not on file   Social History Narrative    FAMILY HISTORY: Family History  Problem Relation Age of Onset  . Heart disease Mother   . Hypertension Mother   . Heart disease Sister   . Hypertension Sister   . Heart disease Brother   . Hypertension Daughter     ALLERGIES:  has No Known Allergies.  MEDICATIONS:  Current Outpatient Prescriptions  Medication Sig Dispense Refill  . acetaminophen (TYLENOL) 500 MG tablet Take 500 mg by mouth every 6 (six) hours as needed for fever.    Marland Kitchen albuterol (PROVENTIL HFA;VENTOLIN HFA) 108 (90 BASE) MCG/ACT inhaler Inhale 1-2 puffs into the lungs every 6 (six) hours as needed for wheezing or shortness of breath.    Marland Kitchen aspirin EC 81 MG tablet Take 81 mg by mouth daily.    . feeding supplement (BOOST / RESOURCE BREEZE) LIQD Take 1 Container by mouth 3 (three) times daily between meals.  0  . lidocaine-prilocaine (EMLA) cream Apply to affected area once (Patient taking differently: Apply 1 application topically daily as needed (For port-a-cath.). Apply to affected  area once) 30 g 3  . mirtazapine (REMERON) 15 MG tablet Take 15 mg by mouth at bedtime.     Marland Kitchen morphine (ROXANOL) 20 MG/ML concentrated solution Take 0.25 mLs (5 mg total) by mouth every 6 (six) hours as needed for severe pain. 30 mL 0  . ondansetron (ZOFRAN) 8 MG tablet 1 TABLET TWICE A DAY STARTING THE DAY AFTER CHEMO FOR 2 DAYS THEN AS NEEDED FOR NAUSEA OR VOMITING  1  . phenytoin (DILANTIN) 100 MG ER capsule Take 100-200 mg by mouth 2 (two) times daily. He takes one capsule in the morning and two capsules at bedtime.    . pravastatin (PRAVACHOL) 40 MG tablet Take 40 mg by mouth every evening.     Marland Kitchen PRESCRIPTION MEDICATION He receives his chemo treatments at the [facility] with Dr. Burr Medico. He is on a 14 day cycle of FOLFOX.    Marland Kitchen terazosin (HYTRIN) 10 MG capsule Take 10 mg  by mouth at bedtime.   0   No current facility-administered medications for this visit.    REVIEW OF SYSTEMS:   Constitutional: Denies fevers, chills or abnormal night sweats, (+) weight loss  Eyes: Denies blurriness of vision, double vision or watery eyes Ears, nose, mouth, throat, and face: Denies mucositis or sore throat Respiratory: Denies cough, dyspnea or wheezes Cardiovascular: Denies palpitation, chest discomfort or lower extremity swelling Gastrointestinal:  (+) Dysphagia, mild heartburn, Denies nausea, vomiting or change in bowel habits Skin: Denies abnormal skin rashes Lymphatics: Denies new lymphadenopathy or easy bruising Neurological:Denies numbness, tingling or new weaknesses Behavioral/Psych: Mood is stable, no new changes  All other systems were reviewed with the patient and are negative.  PHYSICAL EXAMINATION: ECOG PERFORMANCE STATUS: 1 - Symptomatic but completely ambulatory  Filed Vitals:   04/18/15 1050  BP: 113/54  Pulse: 83  Temp: 98.2 F (36.8 C)  Resp: 18   Filed Weights   04/18/15 1050  Weight: 139 lb 1.6 oz (63.095 kg)    GENERAL:alert, no distress and comfortable SKIN: skin color, texture, turgor are normal, no rashes or significant lesions EYES: normal, conjunctiva are pink and non-injected, sclera clear OROPHARYNX:no exudate, no erythema and lips, buccal mucosa, and tongue normal  NECK: supple, thyroid normal size, non-tender, without nodularity LYMPH:  no palpable lymphadenopathy in the cervical, axillary or inguinal LUNGS: clear to auscultation and percussion with normal breathing effort HEART: regular rate & rhythm and no murmurs and no lower extremity edema ABDOMEN:abdomen soft, non-tender and normal bowel sounds Musculoskeletal:no cyanosis of digits and no clubbing  PSYCH: alert & oriented x 3 with fluent speech NEURO: no focal motor/sensory deficits  LABORATORY DATA:  I have reviewed the data as listed CBC Latest Ref Rng 04/18/2015  04/05/2015 03/26/2015  WBC 4.0 - 10.3 10e3/uL 3.9(L) 4.9 5.1  Hemoglobin 13.0 - 17.1 g/dL 9.4(L) 8.5(L) 8.9(L)  Hematocrit 38.4 - 49.9 % 28.4(L) 25.9(L) 26.2(L)  Platelets 140 - 400 10e3/uL 171 270 445(H)    CMP Latest Ref Rng 04/05/2015 03/26/2015 03/25/2015  Glucose 70 - 140 mg/dl 150(H) 103(H) 104(H)  BUN 7.0 - 26.0 mg/dL 7.$RemoveBefo'9 6 7  'IwFfkiUMuvw$ Creatinine 0.7 - 1.3 mg/dL 0.7 0.45(L) 0.47(L)  Sodium 136 - 145 mEq/L 143 138 139  Potassium 3.5 - 5.1 mEq/L 3.9 3.6 3.8  Chloride 101 - 111 mmol/L - 103 103  CO2 22 - 29 mEq/L $Remove'29 28 27  'LCUuipR$ Calcium 8.4 - 10.4 mg/dL 8.6 7.8(L) 7.8(L)  Total Protein 6.4 - 8.3 g/dL 6.8 - -  Total Bilirubin 0.20 - 1.20 mg/dL <0.30 - -  Alkaline Phos 40 - 150 U/L 174(H) - -  AST 5 - 34 U/L 26 - -  ALT 0 - 55 U/L 16 - -   ANC 2.2 today  Pathology report  Diagnosis 01/26/2015 Bone, biopsy, r iliac - 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 Microscopic Comment The metastatic carcinoma is positive by immunohistochemistry with p63, cytokeratin 903 and cytokeratin 5/6. The tumor is negative with CD56, synaptophysin, chromogranin, thyroid transcription factor-1 and S100. (JDP:gt, 01/30/15)   RADIOGRAPHIC STUDIES: I have personally reviewed the radiological images as listed and agreed with the findings in the report.  PET 01/18/2015 IMPRESSION: 1. Previously described esophageal mass is diffusely hypermetabolic. Today's study demonstrates associated hypermetabolic subcarinal and possible right hilar lymphadenopathy, several pulmonary nodules in the lungs bilaterally (largest of which are hypermetabolic), and a hypermetabolic lesion in the superior aspect of the right ilium, compatible with metastatic disease. 2. Atherosclerosis, including left main and 3 vessel coronary artery disease. 3. Additional incidental findings, as above.   ASSESSMENT & PLAN: 69 year old African-American male, with past medical history of hypertension, history of stroke, coronary  artery disease, who presents with progressive dysphasia.  1. 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 -I discussed his right iliac bone biopsy results with patient and his son, unfortunately biopsy confirmed metastatic squamous cell carcinoma. -We reviewed the natural course of esophagitis cancer, and the incurable nature of his disease. We emphasized the goal of therapy is palliation and prolonged his life. -He has completed a short course of palliative radiation  -He is on first-line FOLFOX. -His clinically doing well, gained some weight, lab reviewed, adequate for treatment, we'll proceed with cycle 4 FOLFOX today -he will follow up dietician Pamala Hurry    2. Dysphagia and weight loss -secondary to #1 -follow up with dietician, I encourage him to drink more nutrition supplement  -much improved, he can about 20 pounds in the past 2 months. -Continue mirtazapine  3. HTN, CAD, history of seizure and CVA -Follow up with primary care physician -we discussed that we will watch his BP closely, and may need adjust his meds during chemo   4. Chest pain, secondary to his esophageal cancer and radiation -Much improved -Continue morphine as needed, He is not using much.   5. Anemia, secondary to chemo -Hb 12 before chemo, worse since he started chemo -I checked ferritin and iron level today -Hemoglobin 9.4 today, not symptomatic, we'll continue to observe, we'll consider blood transfusion if hemoglobin below 8 or he becomes more somatic.   Plan -4th cycle FOLFOX today, no 5-fu bolus or neulasta -he will be back to see me  in 2 weeks and chemo, will order restaging CT scan on next visit   All questions were answered. The patient knows to call the clinic with any problems, questions or concerns.  I spent 20 minutes counseling the  patient face to face. The total time spent in the appointment was 25 minutes and more than 50% was on counseling.     Truitt Merle, MD 04/18/2015 11:24 AM

## 2015-04-18 NOTE — Telephone Encounter (Signed)
Per staff message and POF I have scheduled appts. Advised scheduler of appts. JMW  

## 2015-04-20 ENCOUNTER — Other Ambulatory Visit: Payer: Self-pay

## 2015-04-20 ENCOUNTER — Emergency Department (HOSPITAL_COMMUNITY)
Admission: EM | Admit: 2015-04-20 | Discharge: 2015-04-21 | Disposition: A | Discharge status: Home / Self Care | Payer: Medicare Other | Attending: Emergency Medicine | Admitting: Emergency Medicine

## 2015-04-20 ENCOUNTER — Ambulatory Visit (HOSPITAL_BASED_OUTPATIENT_CLINIC_OR_DEPARTMENT_OTHER): Payer: Medicare Other

## 2015-04-20 VITALS — BP 141/73 | HR 72 | Temp 98.4°F | Resp 18

## 2015-04-20 DIAGNOSIS — Z9861 Coronary angioplasty status: Secondary | ICD-10-CM | POA: Insufficient documentation

## 2015-04-20 DIAGNOSIS — C155 Malignant neoplasm of lower third of esophagus: Secondary | ICD-10-CM

## 2015-04-20 DIAGNOSIS — Z79899 Other long term (current) drug therapy: Secondary | ICD-10-CM | POA: Diagnosis not present

## 2015-04-20 DIAGNOSIS — I2510 Atherosclerotic heart disease of native coronary artery without angina pectoris: Secondary | ICD-10-CM | POA: Diagnosis not present

## 2015-04-20 DIAGNOSIS — I10 Essential (primary) hypertension: Secondary | ICD-10-CM | POA: Insufficient documentation

## 2015-04-20 DIAGNOSIS — R7989 Other specified abnormal findings of blood chemistry: Secondary | ICD-10-CM | POA: Diagnosis not present

## 2015-04-20 DIAGNOSIS — Z7982 Long term (current) use of aspirin: Secondary | ICD-10-CM | POA: Diagnosis not present

## 2015-04-20 DIAGNOSIS — R569 Unspecified convulsions: Secondary | ICD-10-CM | POA: Diagnosis not present

## 2015-04-20 DIAGNOSIS — G40909 Epilepsy, unspecified, not intractable, without status epilepticus: Secondary | ICD-10-CM | POA: Diagnosis not present

## 2015-04-20 DIAGNOSIS — R7889 Finding of other specified substances, not normally found in blood: Secondary | ICD-10-CM | POA: Diagnosis not present

## 2015-04-20 DIAGNOSIS — Z8701 Personal history of pneumonia (recurrent): Secondary | ICD-10-CM | POA: Insufficient documentation

## 2015-04-20 DIAGNOSIS — Z9889 Other specified postprocedural states: Secondary | ICD-10-CM | POA: Insufficient documentation

## 2015-04-20 DIAGNOSIS — C159 Malignant neoplasm of esophagus, unspecified: Secondary | ICD-10-CM

## 2015-04-20 DIAGNOSIS — Z8673 Personal history of transient ischemic attack (TIA), and cerebral infarction without residual deficits: Secondary | ICD-10-CM | POA: Diagnosis not present

## 2015-04-20 DIAGNOSIS — Z951 Presence of aortocoronary bypass graft: Secondary | ICD-10-CM | POA: Insufficient documentation

## 2015-04-20 DIAGNOSIS — Z8501 Personal history of malignant neoplasm of esophagus: Secondary | ICD-10-CM | POA: Diagnosis not present

## 2015-04-20 DIAGNOSIS — J449 Chronic obstructive pulmonary disease, unspecified: Secondary | ICD-10-CM | POA: Insufficient documentation

## 2015-04-20 DIAGNOSIS — Z87891 Personal history of nicotine dependence: Secondary | ICD-10-CM | POA: Insufficient documentation

## 2015-04-20 DIAGNOSIS — Z452 Encounter for adjustment and management of vascular access device: Secondary | ICD-10-CM | POA: Diagnosis not present

## 2015-04-20 DIAGNOSIS — M7981 Nontraumatic hematoma of soft tissue: Secondary | ICD-10-CM | POA: Diagnosis not present

## 2015-04-20 LAB — CBC WITH DIFFERENTIAL/PLATELET
Basophils Absolute: 0 10*3/uL (ref 0.0–0.1)
Basophils Relative: 1 %
Eosinophils Absolute: 0.2 10*3/uL (ref 0.0–0.7)
Eosinophils Relative: 6 %
HEMATOCRIT: 28.4 % — AB (ref 39.0–52.0)
Hemoglobin: 9.7 g/dL — ABNORMAL LOW (ref 13.0–17.0)
LYMPHS PCT: 14 %
Lymphs Abs: 0.5 10*3/uL — ABNORMAL LOW (ref 0.7–4.0)
MCH: 30 pg (ref 26.0–34.0)
MCHC: 34.2 g/dL (ref 30.0–36.0)
MCV: 87.9 fL (ref 78.0–100.0)
MONO ABS: 0.2 10*3/uL (ref 0.1–1.0)
MONOS PCT: 7 %
NEUTROS ABS: 2.6 10*3/uL (ref 1.7–7.7)
Neutrophils Relative %: 72 %
Platelets: 203 10*3/uL (ref 150–400)
RBC: 3.23 MIL/uL — ABNORMAL LOW (ref 4.22–5.81)
RDW: 18.8 % — AB (ref 11.5–15.5)
WBC: 3.6 10*3/uL — ABNORMAL LOW (ref 4.0–10.5)

## 2015-04-20 LAB — URINALYSIS, ROUTINE W REFLEX MICROSCOPIC
Bilirubin Urine: NEGATIVE
GLUCOSE, UA: NEGATIVE mg/dL
HGB URINE DIPSTICK: NEGATIVE
KETONES UR: NEGATIVE mg/dL
Leukocytes, UA: NEGATIVE
Nitrite: NEGATIVE
PROTEIN: NEGATIVE mg/dL
SPECIFIC GRAVITY, URINE: 1.017 (ref 1.005–1.030)
pH: 5.5 (ref 5.0–8.0)

## 2015-04-20 LAB — BASIC METABOLIC PANEL
ANION GAP: 8 (ref 5–15)
BUN: 15 mg/dL (ref 6–20)
CO2: 29 mmol/L (ref 22–32)
Calcium: 8.7 mg/dL — ABNORMAL LOW (ref 8.9–10.3)
Chloride: 100 mmol/L — ABNORMAL LOW (ref 101–111)
Creatinine, Ser: 0.65 mg/dL (ref 0.61–1.24)
GFR calc Af Amer: >60 mL/min (ref >60)
GFR calc non Af Amer: >60 mL/min (ref >60)
GLUCOSE: 100 mg/dL — AB (ref 65–99)
POTASSIUM: 4.1 mmol/L (ref 3.5–5.1)
Sodium: 137 mmol/L (ref 135–145)

## 2015-04-20 LAB — RAPID URINE DRUG SCREEN, HOSP PERFORMED
AMPHETAMINES: NOT DETECTED
Barbiturates: NOT DETECTED
Benzodiazepines: NOT DETECTED
Cocaine: NOT DETECTED
OPIATES: POSITIVE — AB
Tetrahydrocannabinol: NOT DETECTED

## 2015-04-20 LAB — ETHANOL: Alcohol, Ethyl (B): <5 mg/dL (ref <5)

## 2015-04-20 LAB — CBG MONITORING, ED: Glucose-Capillary: 102 mg/dL — ABNORMAL HIGH (ref 65–99)

## 2015-04-20 LAB — PHENYTOIN LEVEL, TOTAL: Phenytoin Lvl: 9.1 ug/mL — ABNORMAL LOW (ref 10.0–20.0)

## 2015-04-20 MED ORDER — SODIUM CHLORIDE 0.9 % IV BOLUS (SEPSIS)
500.0000 mL | Freq: Once | INTRAVENOUS | Status: AC
  Administered 2015-04-20: 500 mL via INTRAVENOUS

## 2015-04-20 MED ORDER — PHENYTOIN SODIUM EXTENDED 100 MG PO CAPS
400.0000 mg | ORAL_CAPSULE | Freq: Once | ORAL | Status: AC
  Administered 2015-04-20: 400 mg via ORAL
  Filled 2015-04-20: qty 4

## 2015-04-20 MED ORDER — SODIUM CHLORIDE 0.9 % IJ SOLN
10.0000 mL | INTRAMUSCULAR | Status: DC | PRN
  Administered 2015-04-20: 10 mL
  Filled 2015-04-20: qty 10

## 2015-04-20 MED ORDER — HEPARIN SOD (PORK) LOCK FLUSH 100 UNIT/ML IV SOLN
500.0000 [IU] | Freq: Once | INTRAVENOUS | Status: AC | PRN
  Administered 2015-04-20: 500 [IU]
  Filled 2015-04-20: qty 5

## 2015-04-20 NOTE — ED Notes (Signed)
Bed: [id number] Expected date:  Expected time:  Means of arrival:  Comments: EMS 39F ?seizure

## 2015-04-20 NOTE — Discharge Instructions (Signed)

## 2015-04-20 NOTE — ED Notes (Signed)
Pt is alert and oriented, resting in bed with family present at the bedside.  He denies pain at this time and states that he has no immediate needs.

## 2015-04-20 NOTE — ED Notes (Signed)
Pt transported from home via EMS, family reports witnessed seizure. Hx of same. unkn med compliance. Post ictal at this time per EMS

## 2015-04-20 NOTE — ED Provider Notes (Signed)
CSN: [id number]     Arrival date & time 04/20/15  2214 History   First MD Initiated Contact with Patient 04/20/15 2230     Chief Complaint  Patient presents with  . Seizures     (Consider location/radiation/quality/duration/timing/severity/associated sxs/prior Treatment) HPI   MATRIM GARDY is a 69 y.o. male who presents for evaluation of seizure. Family members called EMS because he had a seizure. Patient was postictal on their arrival. He has since awakened. He has no recollection of the incident. Denies headache, chest pain, shortness of breath, back pain, weakness or dizziness. He did not bite his tongue or injure his extremities. He reports he has been taking his usual medications. He denies using alcohol or taking illegal drugs. There are no other known modifying factors.   Past Medical History  Diagnosis Date  . Coronary artery disease   . Shortness of breath   . Seizures ([place])   . Hypertension   . Pneumonia   . COPD (chronic obstructive pulmonary disease) ([place])   . Stroke ([place])   . Esophageal cancer ([place])   . Radiation 01/25/15-02/13/15    35 gray for esophageal cancer   Past Surgical History  Procedure Laterality Date  . Cardiac catheterization    . Coronary angioplasty with stent placement  07/22/2011  . Cardiac valve surgery    . Coronary artery bypass graft    . Lower extremity angiogram N/A 07/01/2011    Procedure: LOWER EXTREMITY ANGIOGRAM;  Surgeon: Laverda Page, MD;  Location: [facility] CATH LAB;  Service: Cardiovascular;  Laterality: N/A;  . Left heart catheterization with coronary angiogram N/A 07/01/2011    Procedure: LEFT HEART CATHETERIZATION WITH CORONARY ANGIOGRAM;  Surgeon: Laverda Page, MD;  Location: [facility] CATH LAB;  Service: Cardiovascular;  Laterality: N/A;  . Percutaneous coronary rotoblator intervention (pci-r) N/A 07/22/2011    Procedure: PERCUTANEOUS CORONARY ROTOBLATOR INTERVENTION (PCI-R);  Surgeon: Laverda Page, MD;  Location: [facility] CATH LAB;   Service: Cardiovascular;  Laterality: N/A;  . Left heart catheterization with coronary angiogram N/A 10/30/2011    Procedure: LEFT HEART CATHETERIZATION WITH CORONARY ANGIOGRAM;  Surgeon: Laverda Page, MD;  Location: [facility] CATH LAB;  Service: Cardiovascular;  Laterality: N/A;  . Esophagogastroduodenoscopy  01/02/15   Family History  Problem Relation Age of Onset  . Heart disease Mother   . Hypertension Mother   . Heart disease Sister   . Hypertension Sister   . Heart disease Brother   . Hypertension Daughter    Social History  Substance Use Topics  . Smoking status: Former Smoker -- 0.50 packs/day for 50 years    Types: Cigarettes    Quit date: 12/27/2014  . Smokeless tobacco: Never Used  . Alcohol Use: 0.0 oz/week    0 Standard drinks or equivalent per week     Comment: occasional beer, 1-2 per week, used to drink daily heavily     Review of Systems    Allergies  Review of patient's allergies indicates no known allergies.  Home Medications   Prior to Admission medications   Medication Sig Start Date End Date Taking? Authorizing Provider  acetaminophen (TYLENOL) 500 MG tablet Take 500 mg by mouth every 6 (six) hours as needed for fever.   Yes Historical Provider, MD  albuterol (PROVENTIL HFA;VENTOLIN HFA) 108 (90 BASE) MCG/ACT inhaler Inhale 1-2 puffs into the lungs every 6 (six) hours as needed for wheezing or shortness of breath.   Yes Historical Provider, MD  aspirin EC 81 MG tablet  Take 81 mg by mouth daily.   Yes Historical Provider, MD  feeding supplement (BOOST / RESOURCE BREEZE) LIQD Take 1 Container by mouth 3 (three) times daily between meals. 03/26/15  Yes Venetia Maxon Rama, MD  lidocaine-prilocaine (EMLA) cream Apply to affected area once Patient taking differently: Apply 1 application topically daily as needed (For port-a-cath.). Apply to affected area once 02/19/15  Yes Truitt Merle, MD  mirtazapine (REMERON) 15 MG tablet Take 15 mg by mouth at bedtime.  03/14/15  Yes  Historical Provider, MD  morphine (ROXANOL) 20 MG/ML concentrated solution Take 0.25 mLs (5 mg total) by mouth every 6 (six) hours as needed for severe pain. 03/08/15  Yes Truitt Merle, MD  ondansetron (ZOFRAN) 8 MG tablet 1 TABLET TWICE A DAY STARTING THE DAY AFTER CHEMO FOR 2 DAYS THEN AS NEEDED FOR NAUSEA OR VOMITING 02/19/15  Yes Historical Provider, MD  phenytoin (DILANTIN) 100 MG ER capsule Take 100-200 mg by mouth 2 (two) times daily. He takes one capsule in the morning and two capsules at bedtime.   Yes Historical Provider, MD  pravastatin (PRAVACHOL) 40 MG tablet Take 40 mg by mouth every evening.  01/08/15  Yes Historical Provider, MD  PRESCRIPTION MEDICATION He receives his chemo treatments at the [facility] at [facility] with Dr. Burr Medico. He is on a 14 day cycle of FOLFOX.   Yes Historical Provider, MD  terazosin (HYTRIN) 10 MG capsule Take 10 mg by mouth at bedtime.  01/19/15  Yes Historical Provider, MD   BP 146/78 mmHg  Pulse 75  Temp(Src) 98.2 F (36.8 C) (Oral)  Resp 18  SpO2 98% Physical Exam  Constitutional: He is oriented to person, place, and time. He appears well-developed.  Elderly, frail  HENT:  Head: Normocephalic and atraumatic.  Right Ear: External ear normal.  Left Ear: External ear normal.  Very small contusion mid anterior tongue, not bleeding.  Eyes: Conjunctivae and EOM are normal. Pupils are equal, round, and reactive to light.  Neck: Normal range of motion and phonation normal. Neck supple.  Cardiovascular: Normal rate, regular rhythm and normal heart sounds.   Pulmonary/Chest: Effort normal and breath sounds normal. He exhibits no bony tenderness.  Abdominal: Soft. There is no tenderness.  Musculoskeletal: Normal range of motion. He exhibits no tenderness.  Neurological: He is alert and oriented to person, place, and time. No cranial nerve deficit or sensory deficit. He exhibits normal muscle tone. Coordination normal.  Skin: Skin is warm, dry and  intact.  Psychiatric: He has a normal mood and affect. His behavior is normal. Judgment and thought content normal.  Nursing note and vitals reviewed.   ED Course  Procedures (including critical care time)  Medications  sodium chloride 0.9 % bolus 500 mL (500 mLs Intravenous New Bag/Given 04/20/15 2259)  phenytoin (DILANTIN) ER capsule 400 mg (400 mg Oral Given 04/20/15 2347)    Patient Vitals for the past 24 hrs:  BP Temp Temp src Pulse Resp SpO2  04/20/15 2219 146/78 mmHg 98.2 F (36.8 C) Oral 75 18 98 %  04/20/15 2215 - - - - - 100 %    11:50 PM Reevaluation with update and discussion. After initial assessment and treatment, an updated evaluation reveals patient remains alert. Family members with him now. He states that he has been taking his Dilantin per his usual schedule, which varies each day. Take 200 mg of Dilantin about 7 PM tonight. His next dose of Dilantin is due in the morning,  200 mg. Findings discussed with patient and family members, all questions were answered. Zarria Towell L    Labs Review Labs Reviewed  BASIC METABOLIC PANEL - Abnormal; Notable for the following:    Chloride 100 (*)    Glucose, Bld 100 (*)    Calcium 8.7 (*)    All other components within normal limits  CBC WITH DIFFERENTIAL/PLATELET - Abnormal; Notable for the following:    WBC 3.6 (*)    RBC 3.23 (*)    Hemoglobin 9.7 (*)    HCT 28.4 (*)    RDW 18.8 (*)    Lymphs Abs 0.5 (*)    All other components within normal limits  PHENYTOIN LEVEL, TOTAL - Abnormal; Notable for the following:    Phenytoin Lvl 9.1 (*)    All other components within normal limits  URINE RAPID DRUG SCREEN, HOSP PERFORMED - Abnormal; Notable for the following:    Opiates POSITIVE (*)    All other components within normal limits  CBG MONITORING, ED - Abnormal; Notable for the following:    Glucose-Capillary 102 (*)    All other components within normal limits  URINE CULTURE  URINALYSIS, ROUTINE W REFLEX MICROSCOPIC  (NOT AT [facility])  ETHANOL    Imaging Review No results found. I have personally reviewed and evaluated these images and lab results as part of my medical decision-making.   EKG Interpretation   Date/Time:  Friday April 20 2015 22:21:29 EST Ventricular Rate:  75 PR Interval:  134 QRS Duration: 90 QT Interval:  421 QTC Calculation: 470 R Axis:   66 Text Interpretation:  Sinus rhythm Probable left atrial enlargement  Probable LVH with secondary repol abnrm Baseline wander in lead(s) V4  since last tracing no significant change Confirmed by Eulis Foster  MD, Jakub Debold  [id number]) on 04/20/2015 11:32:57 PM      MDM   Final diagnoses:  Seizure ([place])  Subtherapeutic serum dilantin level    Seizure with known seizure disorder. The last level is subtherapeutic. Level boosted with extra 400 mg of Dilantin, given in the ED. Doubt metabolic instability.   Nursing Notes Reviewed/ Care Coordinated Applicable Imaging Reviewed Interpretation of Laboratory Data incorporated into ED treatment  The patient appears reasonably screened and/or stabilized for discharge and I doubt any other medical condition or other [facility] requiring further screening, evaluation, or treatment in the ED at this time prior to discharge.  Plan: Home Medications- usual; Home Treatments- rest; return here if the recommended treatment, does not improve the symptoms; Recommended follow up- PCP 4 days for checkup     Daleen Bo, MD 04/20/15 2352

## 2015-04-22 LAB — URINE CULTURE
Culture: NO GROWTH
Special Requests: NORMAL

## 2015-04-24 DIAGNOSIS — C159 Malignant neoplasm of esophagus, unspecified: Secondary | ICD-10-CM | POA: Diagnosis not present

## 2015-05-02 ENCOUNTER — Encounter: Payer: Self-pay | Admitting: Thoracic Surgery

## 2015-05-03 ENCOUNTER — Other Ambulatory Visit (HOSPITAL_BASED_OUTPATIENT_CLINIC_OR_DEPARTMENT_OTHER): Payer: Medicare Other

## 2015-05-03 ENCOUNTER — Ambulatory Visit (HOSPITAL_BASED_OUTPATIENT_CLINIC_OR_DEPARTMENT_OTHER): Payer: Medicare Other | Admitting: Hematology

## 2015-05-03 ENCOUNTER — Encounter: Payer: Self-pay | Admitting: *Deleted

## 2015-05-03 ENCOUNTER — Telehealth: Payer: Self-pay | Admitting: Hematology

## 2015-05-03 ENCOUNTER — Encounter: Payer: Self-pay | Admitting: Hematology

## 2015-05-03 ENCOUNTER — Telehealth: Payer: Self-pay | Admitting: *Deleted

## 2015-05-03 ENCOUNTER — Ambulatory Visit (HOSPITAL_BASED_OUTPATIENT_CLINIC_OR_DEPARTMENT_OTHER): Payer: Medicare Other

## 2015-05-03 ENCOUNTER — Ambulatory Visit: Payer: Medicare Other | Admitting: Radiation Oncology

## 2015-05-03 VITALS — BP 146/66 | HR 70 | Temp 98.5°F | Resp 17 | Ht 68.0 in | Wt 138.6 lb

## 2015-05-03 DIAGNOSIS — C155 Malignant neoplasm of lower third of esophagus: Secondary | ICD-10-CM

## 2015-05-03 DIAGNOSIS — C159 Malignant neoplasm of esophagus, unspecified: Secondary | ICD-10-CM | POA: Diagnosis not present

## 2015-05-03 DIAGNOSIS — Z5111 Encounter for antineoplastic chemotherapy: Secondary | ICD-10-CM | POA: Diagnosis not present

## 2015-05-03 DIAGNOSIS — C7951 Secondary malignant neoplasm of bone: Secondary | ICD-10-CM

## 2015-05-03 DIAGNOSIS — C7800 Secondary malignant neoplasm of unspecified lung: Secondary | ICD-10-CM | POA: Diagnosis not present

## 2015-05-03 DIAGNOSIS — D6481 Anemia due to antineoplastic chemotherapy: Secondary | ICD-10-CM

## 2015-05-03 LAB — COMPREHENSIVE METABOLIC PANEL
ALT: 14 U/L (ref 0–55)
AST: 26 U/L (ref 5–34)
Albumin: 2.8 g/dL — ABNORMAL LOW (ref 3.5–5.0)
Alkaline Phosphatase: 199 U/L — ABNORMAL HIGH (ref 40–150)
Anion Gap: 10 mEq/L (ref 3–11)
BUN: 11.1 mg/dL (ref 7.0–26.0)
CALCIUM: 8.8 mg/dL (ref 8.4–10.4)
CHLORIDE: 105 meq/L (ref 98–109)
CO2: 27 mEq/L (ref 22–29)
CREATININE: 0.8 mg/dL (ref 0.7–1.3)
EGFR: >90 mL/min/{1.73_m2} (ref >90)
GLUCOSE: 71 mg/dL (ref 70–140)
POTASSIUM: 3.6 meq/L (ref 3.5–5.1)
SODIUM: 142 meq/L (ref 136–145)
Total Bilirubin: <0.3 mg/dL (ref 0.20–1.20)
Total Protein: 8.1 g/dL (ref 6.4–8.3)

## 2015-05-03 LAB — CBC & DIFF AND RETIC
BASO%: 0.8 % (ref 0.0–2.0)
Basophils Absolute: 0 10*3/uL (ref 0.0–0.1)
EOS%: 2.6 % (ref 0.0–7.0)
Eosinophils Absolute: 0.1 10*3/uL (ref 0.0–0.5)
HEMATOCRIT: 31.2 % — AB (ref 38.4–49.9)
HGB: 10.3 g/dL — ABNORMAL LOW (ref 13.0–17.1)
Immature Retic Fract: 6.2 % (ref 3.00–10.60)
LYMPH#: 1 10*3/uL (ref 0.9–3.3)
LYMPH%: 24.7 % (ref 14.0–49.0)
MCH: 29.8 pg (ref 27.2–33.4)
MCHC: 33 g/dL (ref 32.0–36.0)
MCV: 90.2 fL (ref 79.3–98.0)
MONO#: 0.8 10*3/uL (ref 0.1–0.9)
MONO%: 20.6 % — AB (ref 0.0–14.0)
NEUT%: 51.3 % (ref 39.0–75.0)
NEUTROS ABS: 2 10*3/uL (ref 1.5–6.5)
Platelets: 126 10*3/uL — ABNORMAL LOW (ref 140–400)
RBC: 3.46 10*6/uL — AB (ref 4.20–5.82)
RDW: 19.8 % — AB (ref 11.0–14.6)
RETIC %: 1.79 % (ref 0.80–1.80)
RETIC CT ABS: 61.93 10*3/uL (ref 34.80–93.90)
WBC: 3.8 10*3/uL — AB (ref 4.0–10.3)

## 2015-05-03 MED ORDER — OXALIPLATIN CHEMO INJECTION 100 MG/20ML
85.0000 mg/m2 | Freq: Once | INTRAVENOUS | Status: AC
  Administered 2015-05-03: 145 mg via INTRAVENOUS
  Filled 2015-05-03: qty 29

## 2015-05-03 MED ORDER — SODIUM CHLORIDE 0.9 % IV SOLN
2200.0000 mg/m2 | INTRAVENOUS | Status: DC
  Administered 2015-05-03: 3700 mg via INTRAVENOUS
  Filled 2015-05-03: qty 74

## 2015-05-03 MED ORDER — LEUCOVORIN CALCIUM INJECTION 350 MG
400.0000 mg/m2 | Freq: Once | INTRAVENOUS | Status: AC
  Administered 2015-05-03: 676 mg via INTRAVENOUS
  Filled 2015-05-03: qty 33.8

## 2015-05-03 MED ORDER — SODIUM CHLORIDE 0.9 % IJ SOLN
10.0000 mL | INTRAMUSCULAR | Status: DC | PRN
  Filled 2015-05-03: qty 10

## 2015-05-03 MED ORDER — DEXTROSE 5 % IV SOLN
Freq: Once | INTRAVENOUS | Status: AC
  Administered 2015-05-03: 14:00:00 via INTRAVENOUS

## 2015-05-03 MED ORDER — MORPHINE SULFATE (CONCENTRATE) 20 MG/ML PO SOLN: 5.0000 mg | Freq: Four times a day (QID) | ORAL | Status: DC | PRN

## 2015-05-03 MED ORDER — HEPARIN SOD (PORK) LOCK FLUSH 100 UNIT/ML IV SOLN
500.0000 [IU] | Freq: Once | INTRAVENOUS | Status: DC | PRN
  Filled 2015-05-03: qty 5

## 2015-05-03 MED ORDER — SODIUM CHLORIDE 0.9 % IV SOLN
Freq: Once | INTRAVENOUS | Status: AC
  Administered 2015-05-03: 14:00:00 via INTRAVENOUS
  Filled 2015-05-03: qty 4

## 2015-05-03 NOTE — Progress Notes (Signed)
[place]  [phone]) [phone] [phone]  Clinic Follow up Note   Patient Care Team: Glendale Chard, MD as PCP - General (Internal Medicine) Adrian Prows, MD as Consulting Physician (Cardiology) Jovita Gamma, MD as Consulting Physician (Neurosurgery) Lowella Bandy, MD as Consulting Physician (Urology) Grace Isaac, MD as Consulting Physician (Cardiothoracic Surgery) Truitt Merle, MD as Consulting Physician (Hematology) Tania Ade, RN as Registered Nurse Carol Ada, MD as Consulting Physician (Gastroenterology) Gery Pray, MD as Consulting Physician (Radiation Oncology) 05/03/2015  CHIEF COMPLAINTS:  Follow up esophageal squamous cell carcinoma  Oncology History   Squamous cell esophageal cancer   Staging form: Esophagus - Squamous Cell Carcinoma, AJCC 7th Edition     Clinical: Stage IV (TX, N0, M1) - Unsigned       Squamous cell esophageal cancer ([place])   01/02/2015 Initial Diagnosis Squamous cell esophageal cancer   01/02/2015 Procedure EGD by Dr. Benson Norway showed a large, fungating mass with no bleeding and with stigmata met of recent bleeding was found in the third of the esophagus. The mass was completely obstructing and circumferential. The stomach and examined duodenum were normal.   01/02/2015 Imaging CT CAP showed 9 cm segment of irregular his social worker thickening. Single enlarged subcarinal lymph node, bilateral pulmonary nodules (3), 38m right middle lobe next to right atrium, 3 RUL and a 7 mm lingular lung nodules.    01/02/2015 Initial Biopsy Esophageal mass biopsy showed poorly differentiated squamous cell carcinoma.   01/18/2015 Imaging PET scan showed hypermetabolic esophageal mass, subcarinal and possible right hilar adenopathy, several lung nodules, and a hypermetabolic bone lesion in right ilium, compatible with metastatic disease.    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    HISTORY OF PRESENTING ILLNESS:  Andre ENYEART69y.o. male is here because of recently diagnosed esophageal squamous cell carcinoma.  He has had dysphagia for one month, with solid food mainly, he is able to keep liquid and soft diet down. He lost about 30 lbs in the the past month, but his weight has been stable in the past week since he started taking boost. He had some chest pain when he swallows. He is constipated, on stool softener, no bleeding.  He otherwise feels fine. His energy level is fair, he functions very well, no limitation on his physical activities. No nausea, no other pain, cough or other, he drinks about 2 boost a day, soup,and some soft diet.  He was referred to gastroenterologist Dr. HBenson Norwayby his primary care physician. He underwent EGD which showed a circumferential, obstructing, fungating mass in the third esophagus, biopsy showed poorly differentiated squamous cell carcinoma. CT scan chest abdomen and pelvis showed 3 lung nodules, with the largest 1.2 cm in the right middle lobe next to the right atrium.  CURRENT THERAPY: first line chemotherapy with mFOLFOX6, no 5-fu bolus, started on 02/22/15   INTERIM HISTORY GGuiseppereturns for follow-up. He has been tolerating chemotherapy very well, no significant side effects. His chest pain is minimal, he only takes morphine once every few days. No other pain, no significant dysphagia, he eats regular food. He has good appetite and energy level, functions well at home. He overall feels much  better since he started chemotherapy. He had an episode of seizure 2 weeks ago, was seen at the ED, Dilantin level was low. No more seizures since then.  MEDICAL HISTORY:  Past Medical  History  Diagnosis Date  . Coronary artery disease   . Shortness of breath   . Seizures ([place])   . Hypertension   . Pneumonia   . COPD (chronic obstructive pulmonary disease) ([place])   . Stroke ([place])   . Esophageal cancer ([place])   . Radiation 01/25/15-02/13/15    35 gray for esophageal cancer    SURGICAL HISTORY: Past Surgical History  Procedure Laterality Date  . Cardiac catheterization    . Coronary angioplasty with stent placement  07/22/2011  . Cardiac valve surgery    . Coronary artery bypass graft    . Lower extremity angiogram N/A 07/01/2011    Procedure: LOWER EXTREMITY ANGIOGRAM;  Surgeon: Laverda Page, MD;  Location: [facility] CATH LAB;  Service: Cardiovascular;  Laterality: N/A;  . Left heart catheterization with coronary angiogram N/A 07/01/2011    Procedure: LEFT HEART CATHETERIZATION WITH CORONARY ANGIOGRAM;  Surgeon: Laverda Page, MD;  Location: [facility] CATH LAB;  Service: Cardiovascular;  Laterality: N/A;  . Percutaneous coronary rotoblator intervention (pci-r) N/A 07/22/2011    Procedure: PERCUTANEOUS CORONARY ROTOBLATOR INTERVENTION (PCI-R);  Surgeon: Laverda Page, MD;  Location: [facility] CATH LAB;  Service: Cardiovascular;  Laterality: N/A;  . Left heart catheterization with coronary angiogram N/A 10/30/2011    Procedure: LEFT HEART CATHETERIZATION WITH CORONARY ANGIOGRAM;  Surgeon: Laverda Page, MD;  Location: [facility] CATH LAB;  Service: Cardiovascular;  Laterality: N/A;  . Esophagogastroduodenoscopy  01/02/15    SOCIAL HISTORY: Social History   Social History  . Marital Status: Married, lives with his wife     Spouse Name: N/A  . Number of Children: 53, 1 daughter and 3 sons, all lives in [place]   . Years of Education: N/A   Occupational History  . Retired Nature conservation officer    Social History Main Topics  . Smoking status: Current Every Day Smoker -- 0.50 packs/day for 50 years    Types: Cigarettes  . Smokeless tobacco: Never Used  . 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 Drug Use: No  . Sexual Activity: Not on file     Comment: did not ask   Other Topics Concern  . Not on file   Social History Narrative    FAMILY HISTORY: Family History  Problem Relation Age of Onset  . Heart disease Mother   . Hypertension Mother   . Heart disease Sister   . Hypertension Sister   . Heart disease Brother   . Hypertension Daughter     ALLERGIES:  has No Known Allergies.  MEDICATIONS:  Current Outpatient Prescriptions  Medication Sig Dispense Refill  . acetaminophen (TYLENOL) 500 MG tablet Take 500 mg by mouth every 6 (six) hours as needed for fever.    Marland Kitchen albuterol (PROVENTIL HFA;VENTOLIN HFA) 108 (90 BASE) MCG/ACT inhaler Inhale 1-2 puffs into the lungs every 6 (six) hours as needed for wheezing or shortness of breath.    Marland Kitchen aspirin EC 81 MG tablet Take 81 mg by mouth daily.    . feeding supplement (BOOST / RESOURCE BREEZE) LIQD Take 1 Container by mouth 3 (three) times daily between meals.  0  . lidocaine-prilocaine (EMLA) cream Apply to affected area once (Patient taking differently: Apply  1 application topically daily as needed (For port-a-cath.). Apply to affected area once) 30 g 3  . mirtazapine (REMERON) 15 MG tablet Take 15 mg by mouth at bedtime.     Marland Kitchen morphine (ROXANOL) 20 MG/ML concentrated solution Take 0.25 mLs (5 mg total) by mouth every 6 (six) hours as needed for severe pain. 30 mL 0  . ondansetron (ZOFRAN) 8 MG tablet 1 TABLET TWICE A DAY STARTING THE DAY AFTER CHEMO FOR 2 DAYS THEN AS NEEDED FOR NAUSEA OR VOMITING  1  . phenytoin (DILANTIN) 100 MG ER capsule Take 100-200 mg by mouth 2 (two) times daily. He takes one capsule in the morning and two capsules at bedtime.    . pravastatin (PRAVACHOL) 40 MG tablet Take 40 mg by mouth every evening.     Marland Kitchen PRESCRIPTION MEDICATION He receives his chemo treatments at the [facility] with Dr. Mosetta Putt. He is on a  14 day cycle of FOLFOX.    Marland Kitchen terazosin (HYTRIN) 10 MG capsule Take 10 mg by mouth at bedtime.   0   No current facility-administered medications for this visit.    REVIEW OF SYSTEMS:   Constitutional: Denies fevers, chills or abnormal night sweats, (+) weight loss  Eyes: Denies blurriness of vision, double vision or watery eyes Ears, nose, mouth, throat, and face: Denies mucositis or sore throat Respiratory: Denies cough, dyspnea or wheezes Cardiovascular: Denies palpitation, chest discomfort or lower extremity swelling Gastrointestinal:  (+) Dysphagia, mild heartburn, Denies nausea, vomiting or change in bowel habits Skin: Denies abnormal skin rashes Lymphatics: Denies new lymphadenopathy or easy bruising Neurological:Denies numbness, tingling or new weaknesses Behavioral/Psych: Mood is stable, no new changes  All other systems were reviewed with the patient and are negative.  PHYSICAL EXAMINATION: ECOG PERFORMANCE STATUS: 1 - Symptomatic but completely ambulatory  Filed Vitals:   05/03/15 1305  BP: 146/66  Pulse: 70  Temp: 98.5 F (36.9 C)  Resp: 17   Filed Weights   05/03/15 1305  Weight: 138 lb 9.6 oz (62.869 kg)    GENERAL:alert, no distress and comfortable SKIN: skin color, texture, turgor are normal, no rashes or significant lesions EYES: normal, conjunctiva are pink and non-injected, sclera clear OROPHARYNX:no exudate, no erythema and lips, buccal mucosa, and tongue normal  NECK: supple, thyroid normal size, non-tender, without nodularity LYMPH:  no palpable lymphadenopathy in the cervical, axillary or inguinal LUNGS: clear to auscultation and percussion with normal breathing effort HEART: regular rate & rhythm and no murmurs and no lower extremity edema ABDOMEN:abdomen soft, non-tender and normal bowel sounds Musculoskeletal:no cyanosis of digits and no clubbing  PSYCH: alert & oriented x 3 with fluent speech NEURO: no focal motor/sensory deficits  LABORATORY  DATA:  I have reviewed the data as listed CBC Latest Ref Rng 05/03/2015 04/20/2015 04/18/2015  WBC 4.0 - 10.3 10e3/uL 3.8(L) 3.6(L) 3.9(L)  Hemoglobin 13.0 - 17.1 g/dL 10.3(L) 9.7(L) 9.4(L)  Hematocrit 38.4 - 49.9 % 31.2(L) 28.4(L) 28.4(L)  Platelets 140 - 400 10e3/uL 126(L) 203 171    CMP Latest Ref Rng 05/03/2015 04/20/2015 04/18/2015  Glucose 70 - 140 mg/dl 71 362(W) 585  BUN 7.0 - 26.0 mg/dL 95.3 15 79.6  Creatinine 0.7 - 1.3 mg/dL 0.8 2.67 0.8  Sodium 051 - 145 mEq/L 142 137 141  Potassium 3.5 - 5.1 mEq/L 3.6 4.1 4.4  Chloride 101 - 111 mmol/L - 100(L) -  CO2 22 - 29 mEq/L 27 29 30(H)  Calcium 8.4 - 10.4 mg/dL  8.8 8.7(L) 8.8  Total Protein 6.4 - 8.3 g/dL 8.1 - 7.6  Total Bilirubin 0.20 - 1.20 mg/dL <0.30 - <0.30  Alkaline Phos 40 - 150 U/L 199(H) - 167(H)  AST 5 - 34 U/L 26 - 23  ALT 0 - 55 U/L 14 - 12   ANC 2.0 today  Pathology report  Diagnosis 01/26/2015 Bone, biopsy, r iliac - 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 Microscopic Comment The metastatic carcinoma is positive by immunohistochemistry with p63, cytokeratin 903 and cytokeratin 5/6. The tumor is negative with CD56, synaptophysin, chromogranin, thyroid transcription factor-1 and S100. (JDP:gt, 01/30/15)   RADIOGRAPHIC STUDIES: I have personally reviewed the radiological images as listed and agreed with the findings in the report.  PET 01/18/2015 IMPRESSION: 1. Previously described esophageal mass is diffusely hypermetabolic. Today's study demonstrates associated hypermetabolic subcarinal and possible right hilar lymphadenopathy, several pulmonary nodules in the lungs bilaterally (largest of which are hypermetabolic), and a hypermetabolic lesion in the superior aspect of the right ilium, compatible with metastatic disease. 2. Atherosclerosis, including left main and 3 vessel coronary artery disease. 3. Additional incidental findings, as above.   ASSESSMENT & PLAN: 69 year old  African-American male, with past medical history of hypertension, history of stroke, coronary artery disease, who presents with progressive dysphasia.  1. 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 -I discussed his right iliac bone biopsy results with patient and his son, unfortunately biopsy confirmed metastatic squamous cell carcinoma. -We reviewed the natural course of esophagitis cancer, and the incurable nature of his disease. We emphasized the goal of therapy is palliation and prolonged his life. -He has completed a short course of palliative radiation  -He is on first-line FOLFOX, tolerating well, overall feels better -His clinically doing well, gained some weight, lab reviewed, mild thrombocytopenia, anemia improved,  adequate for treatment, we'll proceed with cycle 5 FOLFOX today -restaging PET after cycle 6   2. Dysphagia and weight loss -Dysphasia has resolved since he started chemotherapy  -secondary to #1, improved since he started chemo, gained about 17lbs in last month  -Continue mirtazapine  3. HTN, CAD, history of seizure and CVA -Follow up with primary care physician -He recently had an episode of seizure, his Dilantin dose was adjusted.  -we discussed that we will watch his BP closely, and may need adjust his meds during chemo   4. Chest pain, secondary to his esophageal cancer and radiation -Much improved -Continue morphine as needed, He is not using much.   5. Anemia, secondary to chemo -Hb 12 before chemo, worse since he started chemo -I checked ferritin and iron level on 04/18/15, which was normal -Hemoglobin 10.3 today, not symptomatic, we'll continue to observe, we'll consider blood transfusion if hemoglobin below 8 or he becomes more somatic.   Plan -5th cycle FOLFOX today, no 5-fu bolus or  neulasta -Return to clinic in 2 weeks to see APP, and see me back in 4 weeks with restaging PET  -if ANC<1.0k, or plt<100K in 2 weeks, please postpone chemo for one week or consider dose reduction if board line low  -I refilled his morphine today   All questions were answered. The patient knows to call the clinic with any problems, questions or concerns.  I spent 20 minutes counseling the patient face to face. The total time spent in the appointment was 25  minutes and more than 50% was on counseling.     Truitt Merle, MD 05/03/2015 1:46 PM

## 2015-05-03 NOTE — Telephone Encounter (Signed)
Per staff message and POF I have scheduled appts. Advised scheduler of appts. JMW  

## 2015-05-03 NOTE — Patient Instructions (Signed)
[facility] Discharge Instructions for Patients Receiving Chemotherapy  Today you received the following chemotherapy agents; Leucovroin, Oxaliplatin and Fluorouracil.   To help prevent nausea and vomiting after your treatment, we encourage you to take your nausea medication as directed.    If you develop nausea and vomiting that is not controlled by your nausea medication, call the clinic.   BELOW ARE SYMPTOMS THAT SHOULD BE REPORTED IMMEDIATELY:  *FEVER GREATER THAN 100.5 F  *CHILLS WITH OR WITHOUT FEVER  NAUSEA AND VOMITING THAT IS NOT CONTROLLED WITH YOUR NAUSEA MEDICATION  *UNUSUAL SHORTNESS OF BREATH  *UNUSUAL BRUISING OR BLEEDING  TENDERNESS IN MOUTH AND THROAT WITH OR WITHOUT PRESENCE OF ULCERS  *URINARY PROBLEMS  *BOWEL PROBLEMS  UNUSUAL RASH Items with * indicate a potential emergency and should be followed up as soon as possible.  Feel free to call the clinic you have any questions or concerns. The clinic phone number is [phone].  Please show the CHEMO ALERT CARD at check-in to the Emergency Department and triage nurse.   

## 2015-05-03 NOTE — Progress Notes (Signed)
Oncology Nurse Navigator Documentation  Oncology Nurse Navigator Flowsheets 05/03/2015  Referral date to RadOnc/MedOnc -  Navigator Encounter Type Telephone  Patient Visit Type Medonc  Treatment Phase Treatment # 5 FOLFOX  Barriers/Navigation Needs No barriers at this time  Interventions None required  Referrals -  Support Groups/Services GI  Time Spent with Patient 5  Met briefly in tx area with patient and his wife, Andre Guzman. No navigation needs. Doing well.

## 2015-05-03 NOTE — Telephone Encounter (Signed)
per pof to sch pt appt-sent MW email to sch trmt-pt to get updated copy of avs °

## 2015-05-05 ENCOUNTER — Ambulatory Visit (HOSPITAL_BASED_OUTPATIENT_CLINIC_OR_DEPARTMENT_OTHER): Payer: Medicare Other

## 2015-05-05 VITALS — BP 152/77 | HR 88 | Temp 98.6°F | Resp 18

## 2015-05-05 DIAGNOSIS — Z452 Encounter for adjustment and management of vascular access device: Secondary | ICD-10-CM | POA: Diagnosis not present

## 2015-05-05 DIAGNOSIS — C159 Malignant neoplasm of esophagus, unspecified: Secondary | ICD-10-CM | POA: Diagnosis not present

## 2015-05-05 MED ORDER — HEPARIN SOD (PORK) LOCK FLUSH 100 UNIT/ML IV SOLN
500.0000 [IU] | Freq: Once | INTRAVENOUS | Status: AC | PRN
  Administered 2015-05-05: 500 [IU]
  Filled 2015-05-05: qty 5

## 2015-05-05 MED ORDER — SODIUM CHLORIDE 0.9 % IJ SOLN
10.0000 mL | INTRAMUSCULAR | Status: DC | PRN
  Administered 2015-05-05: 10 mL
  Filled 2015-05-05: qty 10

## 2015-05-06 ENCOUNTER — Emergency Department (HOSPITAL_COMMUNITY)
Admission: EM | Admit: 2015-05-06 | Discharge: 2015-05-06 | Disposition: A | Discharge status: Home / Self Care | Payer: Medicare Other | Attending: Emergency Medicine | Admitting: Emergency Medicine

## 2015-05-06 ENCOUNTER — Encounter (HOSPITAL_COMMUNITY): Payer: Self-pay

## 2015-05-06 DIAGNOSIS — I2510 Atherosclerotic heart disease of native coronary artery without angina pectoris: Secondary | ICD-10-CM | POA: Diagnosis not present

## 2015-05-06 DIAGNOSIS — C159 Malignant neoplasm of esophagus, unspecified: Secondary | ICD-10-CM | POA: Diagnosis not present

## 2015-05-06 DIAGNOSIS — Z8701 Personal history of pneumonia (recurrent): Secondary | ICD-10-CM | POA: Insufficient documentation

## 2015-05-06 DIAGNOSIS — I10 Essential (primary) hypertension: Secondary | ICD-10-CM | POA: Insufficient documentation

## 2015-05-06 DIAGNOSIS — R569 Unspecified convulsions: Secondary | ICD-10-CM | POA: Diagnosis not present

## 2015-05-06 DIAGNOSIS — J449 Chronic obstructive pulmonary disease, unspecified: Secondary | ICD-10-CM | POA: Insufficient documentation

## 2015-05-06 DIAGNOSIS — Z79899 Other long term (current) drug therapy: Secondary | ICD-10-CM | POA: Diagnosis not present

## 2015-05-06 DIAGNOSIS — Z7982 Long term (current) use of aspirin: Secondary | ICD-10-CM | POA: Insufficient documentation

## 2015-05-06 DIAGNOSIS — Z87891 Personal history of nicotine dependence: Secondary | ICD-10-CM | POA: Diagnosis not present

## 2015-05-06 DIAGNOSIS — G40909 Epilepsy, unspecified, not intractable, without status epilepticus: Secondary | ICD-10-CM | POA: Diagnosis not present

## 2015-05-06 DIAGNOSIS — Z8673 Personal history of transient ischemic attack (TIA), and cerebral infarction without residual deficits: Secondary | ICD-10-CM | POA: Diagnosis not present

## 2015-05-06 LAB — CBC WITH DIFFERENTIAL/PLATELET
BASOS ABS: 0 10*3/uL (ref 0.0–0.1)
BASOS PCT: 1 %
EOS ABS: 0.1 10*3/uL (ref 0.0–0.7)
EOS PCT: 2 %
HCT: 29.4 % — ABNORMAL LOW (ref 39.0–52.0)
HEMOGLOBIN: 9.8 g/dL — AB (ref 13.0–17.0)
LYMPHS ABS: 0.6 10*3/uL — AB (ref 0.7–4.0)
Lymphocytes Relative: 20 %
MCH: 29.8 pg (ref 26.0–34.0)
MCHC: 33.3 g/dL (ref 30.0–36.0)
MCV: 89.4 fL (ref 78.0–100.0)
Monocytes Absolute: 0.2 10*3/uL (ref 0.1–1.0)
Monocytes Relative: 7 %
NEUTROS PCT: 70 %
Neutro Abs: 2.2 10*3/uL (ref 1.7–7.7)
PLATELETS: 204 10*3/uL (ref 150–400)
RBC: 3.29 MIL/uL — AB (ref 4.22–5.81)
RDW: 19.2 % — ABNORMAL HIGH (ref 11.5–15.5)
WBC: 3.2 10*3/uL — AB (ref 4.0–10.5)

## 2015-05-06 LAB — COMPREHENSIVE METABOLIC PANEL
ALBUMIN: 2.9 g/dL — AB (ref 3.5–5.0)
ALK PHOS: 162 U/L — AB (ref 38–126)
ALT: 22 U/L (ref 17–63)
AST: 51 U/L — AB (ref 15–41)
Anion gap: 8 (ref 5–15)
BUN: 11 mg/dL (ref 6–20)
CALCIUM: 8.8 mg/dL — AB (ref 8.9–10.3)
CHLORIDE: 104 mmol/L (ref 101–111)
CO2: 29 mmol/L (ref 22–32)
CREATININE: 0.69 mg/dL (ref 0.61–1.24)
GFR calc Af Amer: >60 mL/min (ref >60)
GFR calc non Af Amer: >60 mL/min (ref >60)
GLUCOSE: 106 mg/dL — AB (ref 65–99)
Potassium: 4.4 mmol/L (ref 3.5–5.1)
SODIUM: 141 mmol/L (ref 135–145)
Total Bilirubin: 0.7 mg/dL (ref 0.3–1.2)
Total Protein: 7.6 g/dL (ref 6.5–8.1)

## 2015-05-06 LAB — URINALYSIS, ROUTINE W REFLEX MICROSCOPIC
BILIRUBIN URINE: NEGATIVE
GLUCOSE, UA: NEGATIVE mg/dL
Hgb urine dipstick: NEGATIVE
KETONES UR: NEGATIVE mg/dL
Leukocytes, UA: NEGATIVE
NITRITE: NEGATIVE
PH: 5.5 (ref 5.0–8.0)
Protein, ur: NEGATIVE mg/dL
SPECIFIC GRAVITY, URINE: 1.014 (ref 1.005–1.030)

## 2015-05-06 LAB — PHENYTOIN LEVEL, TOTAL: Phenytoin Lvl: 9 ug/mL — ABNORMAL LOW (ref 10.0–20.0)

## 2015-05-06 NOTE — Discharge Instructions (Signed)

## 2015-05-06 NOTE — ED Notes (Signed)
Bed: RESA Expected date:  Expected time:  Means of arrival:  Comments: Seizure/postictal

## 2015-05-06 NOTE — ED Provider Notes (Signed)
By signing my name below, I, Hansel Feinstein, attest that this documentation has been prepared under the direction and in the presence of [place], DO. Electronically Signed: Hansel Feinstein, ED Scribe. 05/06/2015. 4:31 AM.   TIME SEEN: 4:15 AM   CHIEF COMPLAINT:  Chief Complaint  Patient presents with  . Seizures     HPI:  HPI Comments: Andre Guzman is a 69 y.o. male with h/o seizures on Dilantin, esophageal cancer currently being treated with chemotherapy, CAD, HTN, COPD, stroke who presents to the Emergency Department complaining of a witnessed seizure that occurred last night at home witnessed by his wife. Patient last received chemotherapy yesterday afternoon. Per son, the pts wife witnessed the pt begin to seize while asleep. Pt reports that he is taking his seizure medication as prescribed. He states normal appetite, thirst and sleep habits. Pt is followed by Dr. Lucia Gaskins? for neurology, his PCP is Dr. Baird Cancer. Pt is not on anticoagulants. Pt denies cough, emesis, diarrhea, fever, HA, chest pain or abdominal pain. No injury. Son states he is at his baseline.  ROS: See HPI Constitutional: no fever  Eyes: no drainage  ENT: no runny nose   Cardiovascular:  no chest pain  Resp: no SOB, cough  GI: no vomiting, diarrhea GU: no dysuria Integumentary: no rash  Allergy: no hives  Musculoskeletal: no leg swelling  Neurological: no slurred speech, HA. Positive for seizure.  ROS otherwise negative  PAST MEDICAL HISTORY/PAST SURGICAL HISTORY:  Past Medical History  Diagnosis Date  . Coronary artery disease   . Shortness of breath   . Seizures ([place])   . Hypertension   . Pneumonia   . COPD (chronic obstructive pulmonary disease) ([place])   . Stroke ([place])   . Esophageal cancer ([place])   . Radiation 01/25/15-02/13/15    35 gray for esophageal cancer    MEDICATIONS:  Prior to Admission medications   Medication Sig Start Date End Date Taking? Authorizing Provider  acetaminophen (TYLENOL)  500 MG tablet Take 500 mg by mouth every 6 (six) hours as needed for fever.    Historical Provider, MD  albuterol (PROVENTIL HFA;VENTOLIN HFA) 108 (90 BASE) MCG/ACT inhaler Inhale 1-2 puffs into the lungs every 6 (six) hours as needed for wheezing or shortness of breath.    Historical Provider, MD  aspirin EC 81 MG tablet Take 81 mg by mouth daily.    Historical Provider, MD  feeding supplement (BOOST / RESOURCE BREEZE) LIQD Take 1 Container by mouth 3 (three) times daily between meals. 03/26/15   Venetia Maxon Rama, MD  lidocaine-prilocaine (EMLA) cream Apply to affected area once Patient taking differently: Apply 1 application topically daily as needed (For port-a-cath.). Apply to affected area once 02/19/15   Truitt Merle, MD  mirtazapine (REMERON) 15 MG tablet Take 15 mg by mouth at bedtime.  03/14/15   Historical Provider, MD  morphine (ROXANOL) 20 MG/ML concentrated solution Take 0.25 mLs (5 mg total) by mouth every 6 (six) hours as needed for severe pain. 05/03/15   Truitt Merle, MD  ondansetron (ZOFRAN) 8 MG tablet 1 TABLET TWICE A DAY STARTING THE DAY AFTER CHEMO FOR 2 DAYS THEN AS NEEDED FOR NAUSEA OR VOMITING 02/19/15   Historical Provider, MD  phenytoin (DILANTIN) 100 MG ER capsule Take 100-200 mg by mouth 2 (two) times daily. He takes one capsule in the morning and two capsules at bedtime.    Historical Provider, MD  pravastatin (PRAVACHOL) 40 MG tablet Take 40 mg by mouth  every evening.  01/08/15   Historical Provider, MD  PRESCRIPTION MEDICATION He receives his chemo treatments at the [facility] with Dr. Burr Medico. He is on a 14 day cycle of FOLFOX.    Historical Provider, MD  terazosin (HYTRIN) 10 MG capsule Take 10 mg by mouth at bedtime.  01/19/15   Historical Provider, MD    ALLERGIES:  No Known Allergies  SOCIAL HISTORY:  Social History  Substance Use Topics  . Smoking status: Former Smoker -- 0.50 packs/day for 50 years    Types: Cigarettes    Quit date: 12/27/2014   . Smokeless tobacco: Never Used  . Alcohol Use: 0.0 oz/week    0 Standard drinks or equivalent per week     Comment: occasional beer, 1-2 per week, used to drink daily heavily     FAMILY HISTORY: Family History  Problem Relation Age of Onset  . Heart disease Mother   . Hypertension Mother   . Heart disease Sister   . Hypertension Sister   . Heart disease Brother   . Hypertension Daughter     EXAM: BP 163/94 mmHg  Pulse 89  Temp(Src) 98.3 F (36.8 C) (Oral)  Resp 18  SpO2 97% CONSTITUTIONAL: Alert and oriented and responds appropriately to questions. Well-appearing; well-nourished; GCS 15, elderly, in no distress HEAD: Normocephalic; atraumatic EYES: Conjunctivae clear, PERRL, EOMI ENT: normal nose; no rhinorrhea; moist mucous membranes; pharynx without lesions noted; no dental injury; no septal hematoma NECK: Supple, no meningismus, no LAD; no midline spinal tenderness, step-off or deformity CARD: RRR; S1 and S2 appreciated; no murmurs, no clicks, no rubs, no gallops RESP: Normal chest excursion without splinting or tachypnea; breath sounds clear and equal bilaterally; no wheezes, no rhonchi, no rales; no hypoxia or respiratory distress CHEST:  chest wall stable, no crepitus or ecchymosis or deformity, nontender to palpation ABD/GI: Normal bowel sounds; non-distended; soft, non-tender, no rebound, no guarding PELVIS:  stable, nontender to palpation BACK:  The back appears normal and is non-tender to palpation, there is no CVA tenderness; no midline spinal tenderness, step-off or deformity EXT: Normal ROM in all joints; non-tender to palpation; no edema; normal capillary refill; no cyanosis, no bony tenderness or bony deformity of patient's extremities, no joint effusion, no ecchymosis or lacerations    SKIN: Normal color for age and race; warm NEURO: Moves all extremities equally, sensation to light touch intact diffusely, cranial nerves II through XII intact, normal  gait PSYCH: The patient's mood and manner are appropriate. Grooming and personal hygiene are appropriate.  MEDICAL DECISION MAKING: Patient here seizure. Has had a history of seizures for many years and is on Dilantin. Has had slightly more frequent seizures over the past several weeks likely from starting chemotherapy for esophageal cancer. Will check Dilantin level, labs, urine. Will continue to monitor patient. He is hemodynamically stable, neurologically intact without complaints. Has PCP, oncology and neurology follow-up.  ED PROGRESS: Labs unremarkable. Phenytoin level is 9.0. Have discussed with pharmacist that given his low albumin of 2.9 but this actually corrects to a therapeutic value of 13 and he does not need a loading dose. Have discussed with patient that he should follow-up with his neurologist discuss if any of his medications need to be adjusted or if he should be started on a second antiepileptic. Discussed return precautions. He has not had any further seizure activity in the emergency department and remains neurologically intact without complaints. Patient and his son verbalize understanding and are  comfortable with this plan for discharge home.    I personally performed the services described in this documentation, which was scribed in my presence. The recorded information has been reviewed and is accurate.   [place], DO 05/06/15 [phone]

## 2015-05-06 NOTE — ED Notes (Signed)
Per EMS pt had a witnessed seizure, states hx of 1 seizure after receiving chemo treatment, pt positical on arrival and incontinent of urine

## 2015-05-10 DIAGNOSIS — R569 Unspecified convulsions: Secondary | ICD-10-CM | POA: Diagnosis not present

## 2015-05-10 DIAGNOSIS — C159 Malignant neoplasm of esophagus, unspecified: Secondary | ICD-10-CM | POA: Diagnosis not present

## 2015-05-10 DIAGNOSIS — R030 Elevated blood-pressure reading, without diagnosis of hypertension: Secondary | ICD-10-CM | POA: Diagnosis not present

## 2015-05-10 DIAGNOSIS — Z5181 Encounter for therapeutic drug level monitoring: Secondary | ICD-10-CM | POA: Diagnosis not present

## 2015-05-17 ENCOUNTER — Ambulatory Visit (HOSPITAL_BASED_OUTPATIENT_CLINIC_OR_DEPARTMENT_OTHER): Payer: Medicare Other | Admitting: Nurse Practitioner

## 2015-05-17 ENCOUNTER — Encounter: Payer: Self-pay | Admitting: Nurse Practitioner

## 2015-05-17 ENCOUNTER — Other Ambulatory Visit (HOSPITAL_BASED_OUTPATIENT_CLINIC_OR_DEPARTMENT_OTHER): Payer: Medicare Other

## 2015-05-17 ENCOUNTER — Ambulatory Visit (HOSPITAL_BASED_OUTPATIENT_CLINIC_OR_DEPARTMENT_OTHER): Payer: Medicare Other

## 2015-05-17 VITALS — BP 149/83 | HR 85 | Temp 98.2°F | Resp 19 | Ht 68.0 in | Wt 138.7 lb

## 2015-05-17 DIAGNOSIS — C7951 Secondary malignant neoplasm of bone: Secondary | ICD-10-CM | POA: Diagnosis not present

## 2015-05-17 DIAGNOSIS — D6959 Other secondary thrombocytopenia: Secondary | ICD-10-CM | POA: Diagnosis not present

## 2015-05-17 DIAGNOSIS — C159 Malignant neoplasm of esophagus, unspecified: Secondary | ICD-10-CM | POA: Diagnosis not present

## 2015-05-17 DIAGNOSIS — D6481 Anemia due to antineoplastic chemotherapy: Secondary | ICD-10-CM | POA: Diagnosis not present

## 2015-05-17 DIAGNOSIS — Z5111 Encounter for antineoplastic chemotherapy: Secondary | ICD-10-CM | POA: Diagnosis not present

## 2015-05-17 DIAGNOSIS — T451X5A Adverse effect of antineoplastic and immunosuppressive drugs, initial encounter: Secondary | ICD-10-CM

## 2015-05-17 LAB — COMPREHENSIVE METABOLIC PANEL
ALBUMIN: 2.8 g/dL — AB (ref 3.5–5.0)
ALT: 12 U/L (ref 0–55)
AST: 29 U/L (ref 5–34)
Alkaline Phosphatase: 188 U/L — ABNORMAL HIGH (ref 40–150)
Anion Gap: 9 mEq/L (ref 3–11)
BILIRUBIN TOTAL: 0.3 mg/dL (ref 0.20–1.20)
BUN: 11.2 mg/dL (ref 7.0–26.0)
CO2: 28 meq/L (ref 22–29)
Calcium: 8.9 mg/dL (ref 8.4–10.4)
Chloride: 104 mEq/L (ref 98–109)
Creatinine: 0.9 mg/dL (ref 0.7–1.3)
GLUCOSE: 150 mg/dL — AB (ref 70–140)
POTASSIUM: 4.3 meq/L (ref 3.5–5.1)
SODIUM: 141 meq/L (ref 136–145)
TOTAL PROTEIN: 7.9 g/dL (ref 6.4–8.3)

## 2015-05-17 LAB — CBC & DIFF AND RETIC
BASO%: 0.5 % (ref 0.0–2.0)
Basophils Absolute: 0 10*3/uL (ref 0.0–0.1)
EOS ABS: 0.1 10*3/uL (ref 0.0–0.5)
EOS%: 1.9 % (ref 0.0–7.0)
HCT: 30.7 % — ABNORMAL LOW (ref 38.4–49.9)
HEMOGLOBIN: 10.3 g/dL — AB (ref 13.0–17.1)
IMMATURE RETIC FRACT: 8 % (ref 3.00–10.60)
LYMPH#: 0.8 10*3/uL — AB (ref 0.9–3.3)
LYMPH%: 21.6 % (ref 14.0–49.0)
MCH: 30.2 pg (ref 27.2–33.4)
MCHC: 33.6 g/dL (ref 32.0–36.0)
MCV: 90 fL (ref 79.3–98.0)
MONO#: 0.7 10*3/uL (ref 0.1–0.9)
MONO%: 18.6 % — ABNORMAL HIGH (ref 0.0–14.0)
NEUT%: 57.4 % (ref 39.0–75.0)
NEUTROS ABS: 2.1 10*3/uL (ref 1.5–6.5)
Platelets: 112 10*3/uL — ABNORMAL LOW (ref 140–400)
RBC: 3.41 10*6/uL — AB (ref 4.20–5.82)
RDW: 19.5 % — AB (ref 11.0–14.6)
RETIC %: 1.62 % (ref 0.80–1.80)
Retic Ct Abs: 55.24 10*3/uL (ref 34.80–93.90)
WBC: 3.7 10*3/uL — AB (ref 4.0–10.3)

## 2015-05-17 MED ORDER — FLUOROURACIL CHEMO INJECTION 5 GM/100ML
2200.0000 mg/m2 | INTRAVENOUS | Status: DC
  Administered 2015-05-17: 3700 mg via INTRAVENOUS
  Filled 2015-05-17: qty 74

## 2015-05-17 MED ORDER — DEXTROSE 5 % IV SOLN
Freq: Once | INTRAVENOUS | Status: AC
  Administered 2015-05-17: 12:00:00 via INTRAVENOUS

## 2015-05-17 MED ORDER — LEUCOVORIN CALCIUM INJECTION 100 MG
21.0000 mg/m2 | Freq: Once | INTRAMUSCULAR | Status: AC
  Administered 2015-05-17: 36 mg via INTRAVENOUS
  Filled 2015-05-17: qty 1.8

## 2015-05-17 MED ORDER — OXALIPLATIN CHEMO INJECTION 100 MG/20ML
85.0000 mg/m2 | Freq: Once | INTRAVENOUS | Status: AC
  Administered 2015-05-17: 145 mg via INTRAVENOUS
  Filled 2015-05-17: qty 20

## 2015-05-17 MED ORDER — SODIUM CHLORIDE 0.9 % IV SOLN
Freq: Once | INTRAVENOUS | Status: AC
  Administered 2015-05-17: 12:00:00 via INTRAVENOUS
  Filled 2015-05-17: qty 4

## 2015-05-17 NOTE — Patient Instructions (Signed)
[facility] Discharge Instructions for Patients Receiving Chemotherapy  Today you received the following chemotherapy agents; Leucovroin, Oxaliplatin and Fluorouracil.   To help prevent nausea and vomiting after your treatment, we encourage you to take your nausea medication as directed.    If you develop nausea and vomiting that is not controlled by your nausea medication, call the clinic.   BELOW ARE SYMPTOMS THAT SHOULD BE REPORTED IMMEDIATELY:  *FEVER GREATER THAN 100.5 F  *CHILLS WITH OR WITHOUT FEVER  NAUSEA AND VOMITING THAT IS NOT CONTROLLED WITH YOUR NAUSEA MEDICATION  *UNUSUAL SHORTNESS OF BREATH  *UNUSUAL BRUISING OR BLEEDING  TENDERNESS IN MOUTH AND THROAT WITH OR WITHOUT PRESENCE OF ULCERS  *URINARY PROBLEMS  *BOWEL PROBLEMS  UNUSUAL RASH Items with * indicate a potential emergency and should be followed up as soon as possible.  Feel free to call the clinic you have any questions or concerns. The clinic phone number is [phone].  Please show the CHEMO ALERT CARD at check-in to the Emergency Department and triage nurse.   

## 2015-05-17 NOTE — Progress Notes (Signed)
Andre Guzman  [phone] [phone]  Clinic Follow up Note   Patient Care Team: Glendale Chard, MD as PCP - General (Internal Medicine) Adrian Prows, MD as Consulting Physician (Cardiology) Jovita Gamma, MD as Consulting Physician (Neurosurgery) Lowella Bandy, MD as Consulting Physician (Urology) Grace Isaac, MD as Consulting Physician (Cardiothoracic Surgery) Truitt Merle, MD as Consulting Physician (Hematology) Tania Ade, RN as Registered Nurse Carol Ada, MD as Consulting Physician (Gastroenterology) Gery Pray, MD as Consulting Physician (Radiation Oncology) 05/17/2015  CHIEF COMPLAINTS:  Follow up esophageal squamous cell carcinoma  Oncology History   Squamous cell esophageal cancer   Staging form: Esophagus - Squamous Cell Carcinoma, AJCC 7th Edition     Clinical: Stage IV (TX, N0, M1) - Unsigned       Squamous cell esophageal cancer ([place])   01/02/2015 Initial Diagnosis Squamous cell esophageal cancer   01/02/2015 Procedure EGD by Dr. Benson Norway showed a large, fungating mass with no bleeding and with stigmata met of recent bleeding was found in the third of the esophagus. The mass was completely obstructing and circumferential. The stomach and examined duodenum were normal.   01/02/2015 Imaging CT CAP showed 9 cm segment of irregular his social worker thickening. Single enlarged subcarinal lymph node, bilateral pulmonary nodules (3), 35m right middle lobe next to right atrium, 3 RUL and a 7 mm lingular lung nodules.    01/02/2015 Initial Biopsy Esophageal mass biopsy showed poorly differentiated squamous cell carcinoma.   01/18/2015 Imaging PET scan showed hypermetabolic esophageal mass, subcarinal and possible right hilar adenopathy, several lung nodules, and a hypermetabolic bone lesion in right ilium, compatible with metastatic disease.    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    HISTORY OF PRESENTING ILLNESS:  Andre SCHLEICHER89y.o. male is here because of recently diagnosed esophageal squamous cell carcinoma.  He has had dysphagia for one month, with solid food mainly, he is able to keep liquid and soft diet down. He lost about 30 lbs in the the past month, but his weight has been stable in the past week since he started taking boost. He had some chest pain when he swallows. He is constipated, on stool softener, no bleeding.  He otherwise feels fine. His energy level is fair, he functions very well, no limitation on his physical activities. No nausea, no other pain, cough or other, he drinks about 2 boost a day, soup,and some soft diet.  He was referred to gastroenterologist Dr. HBenson Norwayby his primary care physician. He underwent EGD which showed a circumferential, obstructing, fungating mass in the third esophagus, biopsy showed poorly differentiated squamous cell carcinoma. CT scan chest abdomen and pelvis showed 3 lung nodules, with the largest 1.2 cm in the right middle lobe next to the right atrium.  CURRENT THERAPY: first line chemotherapy with mFOLFOX6, no 5-fu bolus, started on 02/22/15   INTERIM HISTORY GJoannereturns for follow-up, alone. He is due for cycle 6 of FOLFOX today. Generally he tolerates with well with few complaints. He denies fevers, chills, nausea, or vomiting. He takes a stool softener occasionally for constipation. His chest pain is mild and he does not use morphine on a daily basis. He  continues to gain weight since using mirtazapine and protein shakes. His energy level is decent. He has had no seizures since his last visit.     MEDICAL HISTORY:  Past Medical History  Diagnosis Date  .  Coronary artery disease   . Shortness of breath   . Seizures (HCC)   . Hypertension   . Pneumonia   . COPD (chronic obstructive pulmonary disease) (HCC)   . Stroke (HCC)   . Esophageal cancer (HCC)   . Radiation 01/25/15-02/13/15    35 gray for esophageal cancer    SURGICAL HISTORY: Past Surgical History  Procedure Laterality Date  . Cardiac catheterization    . Coronary angioplasty with stent placement  07/22/2011  . Cardiac valve surgery    . Coronary artery bypass graft    . Lower extremity angiogram N/A 07/01/2011    Procedure: LOWER EXTREMITY ANGIOGRAM;  Surgeon: Jagadeesh R Ganji, MD;  Location: [facility] CATH LAB;  Service: Cardiovascular;  Laterality: N/A;  . Left heart catheterization with coronary angiogram N/A 07/01/2011    Procedure: LEFT HEART CATHETERIZATION WITH CORONARY ANGIOGRAM;  Surgeon: Jagadeesh R Ganji, MD;  Location: [facility] CATH [facility];  Service: Cardiovascular;  Laterality: N/A;  . Percutaneous coronary rotoblator intervention (pci-r) N/A 07/22/2011    Procedure: PERCUTANEOUS CORONARY ROTOBLATOR INTERVENTION (PCI-R);  Surgeon: Jagadeesh R Ganji, MD;  Location: [facility] CATH [facility];  Service: Cardiovascular;  Laterality: N/A;  . Left heart catheterization with coronary angiogram N/A 10/30/2011    Procedure: LEFT HEART CATHETERIZATION WITH CORONARY ANGIOGRAM;  Surgeon: Jagadeesh R Ganji, MD;  Location: [facility] CATH LAB;  Service: Cardiovascular;  Laterality: N/A;  . Esophagogastroduodenoscopy  01/02/15    SOCIAL HISTORY: Social History   Social History  . Marital Status: Married, lives with his wife     Spouse Name: N/A  . Number of Children: 4, 1 daughter and 3 sons, all lives in [place]   . Years of Education: N/A   Occupational History  . Retired construction worker    Social History Main Topics  . Smoking status: Current Every Day Smoker -- 0.50 packs/day for 50 years    Types: Cigarettes  . Smokeless tobacco: Never Used  . 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 Drug Use: No  . Sexual Activity: Not on file     Comment: did not ask   Other Topics Concern  . Not on file   Social History Narrative    FAMILY HISTORY: Family History  Problem Relation Age of Onset  . Heart disease Mother   . Hypertension Mother   . Heart disease Sister   . Hypertension Sister   . Heart disease Brother   . Hypertension Daughter     ALLERGIES:  has No Known Allergies.  MEDICATIONS:  Current Outpatient Prescriptions  Medication Sig Dispense Refill  . aspirin EC 81 MG tablet Take 81 mg by mouth daily.    . feeding supplement (BOOST / RESOURCE BREEZE) LIQD Take 1 Container by mouth 3 (three) times daily between meals. (Patient taking differently: Take 1 Container by mouth 2 (two) times daily between meals. )  0  . mirtazapine (REMERON) 15 MG tablet Take 15 mg by mouth at bedtime.     . phenytoin (DILANTIN) 100 MG ER capsule Take 100-200 mg by mouth 2 (two) times daily. He takes one capsule in the morning and two capsules at bedtime.    . pravastatin (PRAVACHOL) 40   MG tablet Take 40 mg by mouth every evening.     Marland Kitchen PRESCRIPTION MEDICATION He receives his chemo treatments at the [facility] with Dr. Burr Medico. He is on a 14 day cycle of FOLFOX.    Marland Kitchen terazosin (HYTRIN) 10 MG capsule Take 10 mg by mouth at bedtime.   0  . albuterol (PROVENTIL HFA;VENTOLIN HFA) 108 (90 BASE) MCG/ACT inhaler Inhale 1-2 puffs into the lungs every 6 (six) hours as needed for wheezing or shortness of breath. Reported on 05/17/2015    . lidocaine-prilocaine (EMLA) cream Apply to affected area once (Patient not taking: Reported on 05/06/2015) 30 g 3  . morphine (ROXANOL) 20 MG/ML concentrated solution Take 0.25 mLs (5 mg total) by mouth every 6 (six) hours as needed for severe pain. (Patient not taking: Reported on 05/17/2015) 30 mL 0   No current facility-administered medications for this visit.    REVIEW OF SYSTEMS:     Constitutional: Denies fevers, chills or abnormal night sweats, (+) weight loss  Eyes: Denies blurriness of vision, double vision or watery eyes Ears, nose, mouth, throat, and face: Denies mucositis or sore throat Respiratory: Denies cough, dyspnea or wheezes Cardiovascular: Denies palpitation, chest discomfort or lower extremity swelling Gastrointestinal: No Dysphagia, mild heartburn, Denies nausea, vomiting or change in bowel habits Skin: Denies abnormal skin rashes Lymphatics: Denies new lymphadenopathy or easy bruising Neurological:Denies numbness, tingling or new weaknesses Behavioral/Psych: Mood is stable, no new changes  All other systems were reviewed with the patient and are negative.  PHYSICAL EXAMINATION: ECOG PERFORMANCE STATUS: 1 - Symptomatic but completely ambulatory  Filed Vitals:   05/17/15 1055  BP: 149/83  Pulse: 85  Temp: 98.2 F (36.8 C)  Resp: 19   Filed Weights   05/17/15 1055  Weight: 138 lb 11.2 oz (62.914 kg)    GENERAL:alert, no distress and comfortable SKIN: skin color, texture, turgor are normal, no rashes or significant lesions EYES: normal, conjunctiva are pink and non-injected, sclera clear OROPHARYNX:no exudate, no erythema and lips, buccal mucosa, and tongue normal  NECK: supple, thyroid normal size, non-tender, without nodularity LYMPH:  no palpable lymphadenopathy in the cervical, axillary or inguinal LUNGS: clear to auscultation and percussion with normal breathing effort HEART: regular rate & rhythm and no murmurs and no lower extremity edema ABDOMEN:abdomen soft, non-tender and normal bowel sounds Musculoskeletal:no cyanosis of digits and no clubbing  PSYCH: alert & oriented x 3 with fluent speech NEURO: no focal motor/sensory deficits  LABORATORY DATA:  I have reviewed the data as listed CBC Latest Ref Rng 05/17/2015 05/06/2015 05/03/2015  WBC 4.0 - 10.3 10e3/uL 3.7(L) 3.2(L) 3.8(L)  Hemoglobin 13.0 - 17.1 g/dL 10.3(L) 9.8(L)  10.3(L)  Hematocrit 38.4 - 49.9 % 30.7(L) 29.4(L) 31.2(L)  Platelets 140 - 400 10e3/uL 112(L) 204 126(L)    CMP Latest Ref Rng 05/17/2015 05/06/2015 05/03/2015  Glucose 70 - 140 mg/dl 150(H) 106(H) 71  BUN 7.0 - 26.0 mg/dL 11.2 11 11.1  Creatinine 0.7 - 1.3 mg/dL 0.9 0.69 0.8  Sodium 136 - 145 mEq/L 141 141 142  Potassium 3.5 - 5.1 mEq/L 4.3 4.4 3.6  Chloride 101 - 111 mmol/L - 104 -  CO2 22 - 29 mEq/L _0 Calcium 8.4 - 10.4 mg/dL 8.9 8.8(L) 8.8  Total Protein 6.4 - 8.3 g/dL 7.9 7.6 8.1  Total Bilirubin 0.20 - 1.20 mg/dL 0.30 0.7 <0.30  Alkaline Phos 40 - 150 U/L 188(H) 162(H) 199(H)  AST 5 - 34 U/L  29 51(H) 26  ALT 0 - 55 U/L 12 22 14   ANC 2.1 today  Pathology report  Diagnosis 01/26/2015 Bone, biopsy, r iliac - 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 Microscopic Comment The metastatic carcinoma is positive by immunohistochemistry with p63, cytokeratin 903 and cytokeratin 5/6. The tumor is negative with CD56, synaptophysin, chromogranin, thyroid transcription factor-1 and S100. (JDP:gt, 01/30/15)   RADIOGRAPHIC STUDIES: I have personally reviewed the radiological images as listed and agreed with the findings in the report.  PET 01/18/2015 IMPRESSION: 1. Previously described esophageal mass is diffusely hypermetabolic. Today's study demonstrates associated hypermetabolic subcarinal and possible right hilar lymphadenopathy, several pulmonary nodules in the lungs bilaterally (largest of which are hypermetabolic), and a hypermetabolic lesion in the superior aspect of the right ilium, compatible with metastatic disease. 2. Atherosclerosis, including left main and 3 vessel coronary artery disease. 3. Additional incidental findings, as above.   ASSESSMENT & PLAN: 69-year-old African-American male, with past medical history of hypertension, history of stroke, coronary artery disease, who presents with progressive dysphasia.  1. 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 -I discussed his right iliac bone biopsy results with patient and his son, unfortunately biopsy confirmed metastatic squamous cell carcinoma. -We reviewed the natural course of esophagitis cancer, and the incurable nature of his disease. We emphasized the goal of therapy is palliation and prolonged his life. -He has completed a short course of palliative radiation  -He is on first-line FOLFOX, tolerating well, overall feels better -His clinically doing well, gained some weight, lab reviewed, mild thrombocytopenia, anemia improved,  adequate for treatment, we'll proceed with cycle 5 FOLFOX today -restaging PET after cycle 6   2. Dysphagia and weight loss -Dysphasia has resolved since he started chemotherapy  -secondary to #1, improved since he started chemo, gained about 17lbs in last month  -Continue mirtazapine and protein supplements  3. HTN, CAD, history of seizure and CVA -Follow up with primary care physician -He recently had an episode of seizure, his Dilantin dose was adjusted.  -we discussed that we will watch his BP closely, and may need adjust his meds during chemo   4. Chest pain, secondary to his esophageal cancer and radiation -Much improved -Continue morphine as needed, He is not using much.  5. Anemia, secondary to chemo -Hb 12 before chemo, worse since he started chemo -I checked ferritin and iron level on 04/18/15, which was normal -Hemoglobin 10.3 today, not symptomatic, we'll continue to observe, we'll consider blood transfusion if hemoglobin below 8 or he becomes more symptomatic  6. Thrombocytopenia, secondary to chemo -platelets 112 today, will treat per Dr. Feng's instructions in the last progress note -further drop in platelet count may require a dose reduction of oxaliplatin  Plan -6th  cycle FOLFOX today, no 5-fu bolus or neulasta -Return to clinic in 2 weeks for follow up with Dr. Feng, PET scan prior to this visit   All questions were answered. The patient knows to call the clinic with any problems, questions or concerns.  I spent 20 minutes counseling the patient face to face. The total time spent in the appointment was 25 minutes and more than 50% was on counseling.    Heather F Boelter, NP 05/17/2015 11:51 AM 

## 2015-05-19 ENCOUNTER — Ambulatory Visit (HOSPITAL_BASED_OUTPATIENT_CLINIC_OR_DEPARTMENT_OTHER): Payer: Medicare Other

## 2015-05-19 VITALS — BP 133/69 | HR 88 | Temp 98.1°F | Resp 18

## 2015-05-19 DIAGNOSIS — C159 Malignant neoplasm of esophagus, unspecified: Secondary | ICD-10-CM

## 2015-05-19 DIAGNOSIS — Z452 Encounter for adjustment and management of vascular access device: Secondary | ICD-10-CM

## 2015-05-19 MED ORDER — HEPARIN SOD (PORK) LOCK FLUSH 100 UNIT/ML IV SOLN
500.0000 [IU] | Freq: Once | INTRAVENOUS | Status: AC | PRN
  Administered 2015-05-19: 500 [IU]
  Filled 2015-05-19: qty 5

## 2015-05-19 MED ORDER — SODIUM CHLORIDE 0.9 % IJ SOLN
10.0000 mL | INTRAMUSCULAR | Status: DC | PRN
  Administered 2015-05-19: 10 mL
  Filled 2015-05-19: qty 10

## 2015-05-19 NOTE — Patient Instructions (Signed)

## 2015-05-23 DIAGNOSIS — R569 Unspecified convulsions: Secondary | ICD-10-CM | POA: Diagnosis not present

## 2015-05-25 DIAGNOSIS — I6521 Occlusion and stenosis of right carotid artery: Secondary | ICD-10-CM | POA: Diagnosis not present

## 2015-05-25 DIAGNOSIS — C159 Malignant neoplasm of esophagus, unspecified: Secondary | ICD-10-CM | POA: Diagnosis not present

## 2015-05-25 DIAGNOSIS — R569 Unspecified convulsions: Secondary | ICD-10-CM | POA: Diagnosis not present

## 2015-05-29 ENCOUNTER — Ambulatory Visit (HOSPITAL_COMMUNITY)
Admission: RE | Admit: 2015-05-29 | Discharge: 2015-05-29 | Disposition: A | Discharge status: Home / Self Care | Payer: Medicare Other | Source: Ambulatory Visit | Attending: Hematology | Admitting: Hematology

## 2015-05-29 DIAGNOSIS — C159 Malignant neoplasm of esophagus, unspecified: Secondary | ICD-10-CM | POA: Insufficient documentation

## 2015-05-29 DIAGNOSIS — C7800 Secondary malignant neoplasm of unspecified lung: Secondary | ICD-10-CM | POA: Diagnosis not present

## 2015-05-29 DIAGNOSIS — C7802 Secondary malignant neoplasm of left lung: Secondary | ICD-10-CM | POA: Diagnosis not present

## 2015-05-29 DIAGNOSIS — N138 Other obstructive and reflux uropathy: Secondary | ICD-10-CM | POA: Diagnosis not present

## 2015-05-29 DIAGNOSIS — C7951 Secondary malignant neoplasm of bone: Secondary | ICD-10-CM | POA: Insufficient documentation

## 2015-05-29 DIAGNOSIS — C7801 Secondary malignant neoplasm of right lung: Secondary | ICD-10-CM | POA: Insufficient documentation

## 2015-05-29 DIAGNOSIS — N401 Enlarged prostate with lower urinary tract symptoms: Secondary | ICD-10-CM | POA: Diagnosis not present

## 2015-05-29 DIAGNOSIS — N323 Diverticulum of bladder: Secondary | ICD-10-CM | POA: Diagnosis not present

## 2015-05-29 LAB — GLUCOSE, CAPILLARY: GLUCOSE-CAPILLARY: 81 mg/dL (ref 65–99)

## 2015-05-29 MED ORDER — FLUDEOXYGLUCOSE F - 18 (FDG) INJECTION
6.8400 | Freq: Once | INTRAVENOUS | Status: AC | PRN
  Administered 2015-05-29: 6.84 via INTRAVENOUS

## 2015-05-31 ENCOUNTER — Other Ambulatory Visit (HOSPITAL_BASED_OUTPATIENT_CLINIC_OR_DEPARTMENT_OTHER): Payer: Medicare Other

## 2015-05-31 ENCOUNTER — Ambulatory Visit (HOSPITAL_BASED_OUTPATIENT_CLINIC_OR_DEPARTMENT_OTHER): Payer: Medicare Other

## 2015-05-31 ENCOUNTER — Ambulatory Visit (HOSPITAL_BASED_OUTPATIENT_CLINIC_OR_DEPARTMENT_OTHER): Payer: Medicare Other | Admitting: Hematology

## 2015-05-31 ENCOUNTER — Encounter: Payer: Self-pay | Admitting: Hematology

## 2015-05-31 VITALS — BP 152/84 | HR 81 | Temp 98.4°F | Resp 16 | Ht 68.0 in | Wt 143.9 lb

## 2015-05-31 DIAGNOSIS — D649 Anemia, unspecified: Secondary | ICD-10-CM | POA: Diagnosis not present

## 2015-05-31 DIAGNOSIS — C7800 Secondary malignant neoplasm of unspecified lung: Secondary | ICD-10-CM

## 2015-05-31 DIAGNOSIS — R634 Abnormal weight loss: Secondary | ICD-10-CM

## 2015-05-31 DIAGNOSIS — C155 Malignant neoplasm of lower third of esophagus: Secondary | ICD-10-CM

## 2015-05-31 DIAGNOSIS — C159 Malignant neoplasm of esophagus, unspecified: Secondary | ICD-10-CM | POA: Diagnosis not present

## 2015-05-31 DIAGNOSIS — C7951 Secondary malignant neoplasm of bone: Secondary | ICD-10-CM

## 2015-05-31 DIAGNOSIS — Z5111 Encounter for antineoplastic chemotherapy: Secondary | ICD-10-CM | POA: Diagnosis not present

## 2015-05-31 DIAGNOSIS — D6959 Other secondary thrombocytopenia: Secondary | ICD-10-CM

## 2015-05-31 LAB — COMPREHENSIVE METABOLIC PANEL
ALK PHOS: 209 U/L — AB (ref 40–150)
ALT: 11 U/L (ref 0–55)
AST: 24 U/L (ref 5–34)
Albumin: 2.8 g/dL — ABNORMAL LOW (ref 3.5–5.0)
Anion Gap: 10 mEq/L (ref 3–11)
BUN: 11.2 mg/dL (ref 7.0–26.0)
CO2: 26 meq/L (ref 22–29)
Calcium: 9 mg/dL (ref 8.4–10.4)
Chloride: 106 mEq/L (ref 98–109)
Creatinine: 0.8 mg/dL (ref 0.7–1.3)
GLUCOSE: 116 mg/dL (ref 70–140)
POTASSIUM: 4 meq/L (ref 3.5–5.1)
SODIUM: 142 meq/L (ref 136–145)
Total Bilirubin: <0.3 mg/dL (ref 0.20–1.20)
Total Protein: 8.1 g/dL (ref 6.4–8.3)

## 2015-05-31 LAB — CBC & DIFF AND RETIC
BASO%: 0.5 % (ref 0.0–2.0)
BASOS ABS: 0 10*3/uL (ref 0.0–0.1)
EOS ABS: 0 10*3/uL (ref 0.0–0.5)
EOS%: 1 % (ref 0.0–7.0)
HEMATOCRIT: 30.8 % — AB (ref 38.4–49.9)
HGB: 10.2 g/dL — ABNORMAL LOW (ref 13.0–17.1)
IMMATURE RETIC FRACT: 11 % — AB (ref 3.00–10.60)
LYMPH#: 0.6 10*3/uL — AB (ref 0.9–3.3)
LYMPH%: 16.4 % (ref 14.0–49.0)
MCH: 30.4 pg (ref 27.2–33.4)
MCHC: 33.1 g/dL (ref 32.0–36.0)
MCV: 91.9 fL (ref 79.3–98.0)
MONO#: 0.8 10*3/uL (ref 0.1–0.9)
MONO%: 20.5 % — ABNORMAL HIGH (ref 0.0–14.0)
NEUT#: 2.4 10*3/uL (ref 1.5–6.5)
NEUT%: 61.6 % (ref 39.0–75.0)
PLATELETS: 128 10*3/uL — AB (ref 140–400)
RBC: 3.35 10*6/uL — ABNORMAL LOW (ref 4.20–5.82)
RDW: 19.4 % — ABNORMAL HIGH (ref 11.0–14.6)
RETIC %: 1.82 % — AB (ref 0.80–1.80)
RETIC CT ABS: 60.97 10*3/uL (ref 34.80–93.90)
WBC: 3.9 10*3/uL — ABNORMAL LOW (ref 4.0–10.3)

## 2015-05-31 LAB — IRON AND TIBC
%SAT: 42 % (ref 20–55)
IRON: 94 ug/dL (ref 42–163)
TIBC: 226 ug/dL (ref 202–409)
UIBC: 132 ug/dL (ref 117–376)

## 2015-05-31 LAB — FERRITIN: Ferritin: 187 ng/ml (ref 22–316)

## 2015-05-31 MED ORDER — OXALIPLATIN CHEMO INJECTION 100 MG/20ML
85.0000 mg/m2 | Freq: Once | INTRAVENOUS | Status: AC
  Administered 2015-05-31: 145 mg via INTRAVENOUS
  Filled 2015-05-31: qty 10

## 2015-05-31 MED ORDER — LEUCOVORIN CALCIUM INJECTION 100 MG
20.0000 mg/m2 | Freq: Once | INTRAMUSCULAR | Status: AC
  Administered 2015-05-31: 34 mg via INTRAVENOUS
  Filled 2015-05-31: qty 1.7

## 2015-05-31 MED ORDER — FLUOROURACIL CHEMO INJECTION 5 GM/100ML
2200.0000 mg/m2 | INTRAVENOUS | Status: DC
  Administered 2015-05-31: 3700 mg via INTRAVENOUS
  Filled 2015-05-31: qty 74

## 2015-05-31 MED ORDER — DEXTROSE 5 % IV SOLN
Freq: Once | INTRAVENOUS | Status: AC
  Administered 2015-05-31: 12:00:00 via INTRAVENOUS

## 2015-05-31 MED ORDER — SODIUM CHLORIDE 0.9 % IV SOLN
Freq: Once | INTRAVENOUS | Status: AC
  Administered 2015-05-31: 12:00:00 via INTRAVENOUS
  Filled 2015-05-31: qty 4

## 2015-05-31 NOTE — Progress Notes (Signed)
[facility]  [phone] [phone]  Clinic Follow up Note   Patient Care Team: Dorothyann Peng, MD as PCP - General (Internal Medicine) Yates Decamp, MD as Consulting Physician (Cardiology) Shirlean Kelly, MD as Consulting Physician (Neurosurgery) Su Grand, MD as Consulting Physician (Urology) Delight Ovens, MD as Consulting Physician (Cardiothoracic Surgery) Malachy Mood, MD as Consulting Physician (Hematology) Wandalee Ferdinand, RN as Registered Nurse Jeani Hawking, MD as Consulting Physician (Gastroenterology) Antony Blackbird, MD as Consulting Physician (Radiation Oncology) 05/31/2015  CHIEF COMPLAINTS:  Follow up esophageal squamous cell carcinoma  Oncology History   Squamous cell esophageal cancer   Staging form: Esophagus - Squamous Cell Carcinoma, AJCC 7th Edition     Clinical: Stage IV (TX, N0, M1) - Unsigned       Squamous cell esophageal cancer (HCC)   01/02/2015 Initial Diagnosis Squamous cell esophageal cancer   01/02/2015 Procedure EGD by Dr. Elnoria Howard showed a large, fungating mass with no bleeding and with stigmata met of recent bleeding was found in the third of the esophagus. The mass was completely obstructing and circumferential. The stomach and examined duodenum were normal.   01/02/2015 Imaging CT CAP showed 9 cm segment of irregular his social worker thickening. Single enlarged subcarinal lymph node, bilateral pulmonary nodules (3), 105mm right middle lobe next to right atrium, 3 RUL and a 7 mm lingular lung nodules.    01/02/2015 Initial Biopsy Esophageal mass biopsy showed poorly differentiated squamous cell carcinoma.   01/18/2015 Imaging PET scan showed hypermetabolic esophageal mass, subcarinal and possible right hilar adenopathy, several lung nodules, and a hypermetabolic bone lesion in right ilium, compatible with metastatic disease.    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    HISTORY OF PRESENTING ILLNESS:  Andre Guzman 70 y.o. male is here because of recently diagnosed esophageal squamous cell carcinoma.  He has had dysphagia for one month, with solid food mainly, he is able to keep liquid and soft diet down. He lost about 30 lbs in the the past month, but his weight has been stable in the past week since he started taking boost. He had some chest pain when he swallows. He is constipated, on stool softener, no bleeding.  He otherwise feels fine. His energy level is fair, he functions very well, no limitation on his physical activities. No nausea, no other pain, cough or other, he drinks about 2 boost a day, soup,and some soft diet.  He was referred to gastroenterologist Dr. Elnoria Howard by his primary care physician. He underwent EGD which showed a circumferential, obstructing, fungating mass in the third esophagus, biopsy showed poorly differentiated squamous cell carcinoma. CT scan chest abdomen and pelvis showed 3 lung nodules, with the largest 1.2 cm in the right middle lobe next to the right atrium.  CURRENT THERAPY: first line chemotherapy with mFOLFOX6, no 5-fu bolus, started on 02/22/15   INTERIM HISTORY Breydan returns for follow-up and discuss PET scan result. He is clinically doing well, no dysphasia or odynophagia. He has good appetite and eating well. He denies significant pain, nausea, or abdominal discomfort. He has mild-to-moderate fatigue, able tolerate daily activity without much difficulty. No fever or chills. He has been tolerating chemotherapy  very well.  MEDICAL HISTORY:  Past Medical History  Diagnosis Date  . Coronary artery disease   . Shortness of breath   . Seizures ([place])   .  Hypertension   . Pneumonia   . COPD (chronic obstructive pulmonary disease) ([place])   . Stroke ([place])   . Esophageal cancer ([place])   . Radiation 01/25/15-02/13/15    35 gray for esophageal cancer    SURGICAL HISTORY: Past Surgical History  Procedure Laterality Date  . Cardiac catheterization    . Coronary angioplasty with stent placement  07/22/2011  . Cardiac valve surgery    . Coronary artery bypass graft    . Lower extremity angiogram N/A 07/01/2011    Procedure: LOWER EXTREMITY ANGIOGRAM;  Surgeon: Laverda Page, MD;  Location: [facility] CATH LAB;  Service: Cardiovascular;  Laterality: N/A;  . Left heart catheterization with coronary angiogram N/A 07/01/2011    Procedure: LEFT HEART CATHETERIZATION WITH CORONARY ANGIOGRAM;  Surgeon: Laverda Page, MD;  Location: [facility] CATH LAB;  Service: Cardiovascular;  Laterality: N/A;  . Percutaneous coronary rotoblator intervention (pci-r) N/A 07/22/2011    Procedure: PERCUTANEOUS CORONARY ROTOBLATOR INTERVENTION (PCI-R);  Surgeon: Laverda Page, MD;  Location: [facility] CATH LAB;  Service: Cardiovascular;  Laterality: N/A;  . Left heart catheterization with coronary angiogram N/A 10/30/2011    Procedure: LEFT HEART CATHETERIZATION WITH CORONARY ANGIOGRAM;  Surgeon: Laverda Page, MD;  Location: [facility] CATH LAB;  Service: Cardiovascular;  Laterality: N/A;  . Esophagogastroduodenoscopy  01/02/15    SOCIAL HISTORY: Social History   Social History  . Marital Status: Married, lives with his wife     Spouse Name: N/A  . Number of Children: 48, 1 daughter and 3 sons, all lives in [place]   . Years of Education: N/A   Occupational History  . Retired Nature conservation officer    Social History Main Topics  . Smoking status: Current Every Day Smoker -- 0.50 packs/day for 50 years    Types: Cigarettes  . Smokeless tobacco: Never Used  . 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 Drug Use: No  . Sexual Activity:  Not on file     Comment: did not ask   Other Topics Concern  . Not on file   Social History Narrative    FAMILY HISTORY: Family History  Problem Relation Age of Onset  . Heart disease Mother   . Hypertension Mother   . Heart disease Sister   . Hypertension Sister   . Heart disease Brother   . Hypertension Daughter     ALLERGIES:  has No Known Allergies.  MEDICATIONS:  Current Outpatient Prescriptions  Medication Sig Dispense Refill  . albuterol (PROVENTIL HFA;VENTOLIN HFA) 108 (90 BASE) MCG/ACT inhaler Inhale 1-2 puffs into the lungs every 6 (six) hours as needed for wheezing or shortness of breath. Reported on 05/17/2015    . aspirin EC 81 MG tablet Take 81 mg by mouth daily.    . feeding supplement (BOOST / RESOURCE BREEZE) LIQD Take 1 Container by mouth 3 (three) times daily between meals. (Patient taking differently: Take 1 Container by mouth 2 (two) times daily between meals. )  0  . lidocaine-prilocaine (EMLA) cream Apply to affected area once (Patient not taking: Reported on 05/06/2015) 30 g 3  . mirtazapine (REMERON) 15 MG tablet Take 15 mg by mouth at bedtime.     Marland Kitchen morphine (ROXANOL) 20 MG/ML concentrated solution  Take 0.25 mLs (5 mg total) by mouth every 6 (six) hours as needed for severe pain. (Patient not taking: Reported on 05/17/2015) 30 mL 0  . phenytoin (DILANTIN) 100 MG ER capsule Take 100-200 mg by mouth 2 (two) times daily. He takes one capsule in the morning and two capsules at bedtime.    . pravastatin (PRAVACHOL) 40 MG tablet Take 40 mg by mouth every evening.     Marland Kitchen PRESCRIPTION MEDICATION He receives his chemo treatments at the [facility] with Dr. Burr Medico. He is on a 14 day cycle of FOLFOX.    Marland Kitchen terazosin (HYTRIN) 10 MG capsule Take 10 mg by mouth at bedtime.   0   No current facility-administered medications for this visit.    REVIEW OF SYSTEMS:   Constitutional: Denies fevers, chills or abnormal night sweats, (+) weight loss    Eyes: Denies blurriness of vision, double vision or watery eyes Ears, nose, mouth, throat, and face: Denies mucositis or sore throat Respiratory: Denies cough, dyspnea or wheezes Cardiovascular: Denies palpitation, chest discomfort or lower extremity swelling Gastrointestinal:  (+) Dysphagia, mild heartburn, Denies nausea, vomiting or change in bowel habits Skin: Denies abnormal skin rashes Lymphatics: Denies new lymphadenopathy or easy bruising Neurological:Denies numbness, tingling or new weaknesses Behavioral/Psych: Mood is stable, no new changes  All other systems were reviewed with the patient and are negative.  PHYSICAL EXAMINATION: ECOG PERFORMANCE STATUS: 1 - Symptomatic but completely ambulatory  Filed Vitals:   05/31/15 1127  BP: 152/84  Pulse: 81  Temp: 98.4 F (36.9 C)  Resp: 16   Filed Weights   05/31/15 1127  Weight: 143 lb 14.4 oz (65.273 kg)    GENERAL:alert, no distress and comfortable SKIN: skin color, texture, turgor are normal, no rashes or significant lesions EYES: normal, conjunctiva are pink and non-injected, sclera clear OROPHARYNX:no exudate, no erythema and lips, buccal mucosa, and tongue normal  NECK: supple, thyroid normal size, non-tender, without nodularity LYMPH:  no palpable lymphadenopathy in the cervical, axillary or inguinal LUNGS: clear to auscultation and percussion with normal breathing effort HEART: regular rate & rhythm and no murmurs and no lower extremity edema ABDOMEN:abdomen soft, non-tender and normal bowel sounds Musculoskeletal:no cyanosis of digits and no clubbing  PSYCH: alert & oriented x 3 with fluent speech NEURO: no focal motor/sensory deficits  LABORATORY DATA:  I have reviewed the data as listed CBC Latest Ref Rng 05/31/2015 05/17/2015 05/06/2015  WBC 4.0 - 10.3 10e3/uL 3.9(L) 3.7(L) 3.2(L)  Hemoglobin 13.0 - 17.1 g/dL 10.2(L) 10.3(L) 9.8(L)  Hematocrit 38.4 - 49.9 % 30.8(L) 30.7(L) 29.4(L)  Platelets 140 - 400  10e3/uL 128(L) 112(L) 204    CMP Latest Ref Rng 05/31/2015 05/17/2015 05/06/2015  Glucose 70 - 140 mg/dl 116 150(H) 106(H)  BUN 7.0 - 26.0 mg/dL 11.2 11.2 11  Creatinine 0.7 - 1.3 mg/dL 0.8 0.9 0.69  Sodium 136 - 145 mEq/L 142 141 141  Potassium 3.5 - 5.1 mEq/L 4.0 4.3 4.4  Chloride 101 - 111 mmol/L - - 104  CO2 22 - 29 mEq/L '26 28 29  '$ Calcium 8.4 - 10.4 mg/dL 9.0 8.9 8.8(L)  Total Protein 6.4 - 8.3 g/dL 8.1 7.9 7.6  Total Bilirubin 0.20 - 1.20 mg/dL <0.30 0.30 0.7  Alkaline Phos 40 - 150 U/L 209(H) 188(H) 162(H)  AST 5 - 34 U/L 24 29 51(H)  ALT 0 - 55 U/L '11 12 22   '$ ANC 2.4 today  Pathology report  Diagnosis 01/26/2015 Bone,  biopsy, r iliac - 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 Microscopic Comment The metastatic carcinoma is positive by immunohistochemistry with p63, cytokeratin 903 and cytokeratin 5/6. The tumor is negative with CD56, synaptophysin, chromogranin, thyroid transcription factor-1 and S100. (JDP:gt, 01/30/15)   RADIOGRAPHIC STUDIES: I have personally reviewed the radiological images as listed and agreed with the findings in the report.  PET 01/18/2015 IMPRESSION: 1. Previously described esophageal mass is diffusely hypermetabolic. Today's study demonstrates associated hypermetabolic subcarinal and possible right hilar lymphadenopathy, several pulmonary nodules in the lungs bilaterally (largest of which are hypermetabolic), and a hypermetabolic lesion in the superior aspect of the right ilium, compatible with metastatic disease. 2. Atherosclerosis, including left main and 3 vessel coronary artery disease. 3. Additional incidental findings, as above.  PET 05/29/2015 IMPRESSION: Mixed response to therapy, with marked decrease in hypermetabolic esophageal mass and resolution of mild mediastinal and right hilar lymphadenopathy.  Mild progression of bilateral pulmonary metastases.  Mild progression of bone metastases in the right ilium  and right posterior twelfth rib.   ASSESSMENT & PLAN: 70 year old African-American male, with past medical history of hypertension, history of stroke, coronary artery disease, who presents with progressive dysphasia.  1. 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 -I discussed his right iliac bone biopsy results with patient and his son, unfortunately biopsy confirmed metastatic squamous cell carcinoma. -We reviewed the natural course of esophagitis cancer, and the incurable nature of his disease. We emphasized the goal of therapy is palliation and prolonged his life. -He has completed a short course of palliative radiation  -He is on first-line FOLFOX, tolerating well, overall feels better -I discussed his restaging PET/CT result and review the image personally with patient and his wife. His primary esophageal tumor and paraesophageal adenopathy has significantly improved. His pulmonary nodules are stable, right iliac bone has increased metabolic activity. His previous PET scan was done we'll months before he started chemotherapy. I do not think he has significant disease progression. -He is clinically doing well, tolerating chemotherapy very well, we'll continue FOLFOX. -will repeat PET in 2 months   2. Dysphagia and weight loss -Dysphasia has resolved since he started chemotherapy  -secondary to #1, improved since he started chemo, gained about 17lbs in last month  -Continue mirtazapine  3. HTN, CAD, history of seizure and CVA -Follow up with primary care physician -He recently had an episode of seizure, his Dilantin dose was adjusted.  -we discussed that we will watch his BP closely, and may need adjust his meds during chemo   4. 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 The repeat Lyme study today results still pending -His mild anemia has been stable, no need for transfusion, we'll continue monitoring  5. Mild thrombocytopenia, secondary to chemotherapy -Continue monitoring   Plan -7th cycle FOLFOX today, no 5-fu bolus or neulasta -I will see him back in 4 weeks   All questions were answered. The patient knows to call the clinic with any problems, questions or concerns.  I spent 20 minutes counseling the patient face to face. The total time spent in the appointment was 25 minutes and more than 50% was on counseling.     Truitt Merle, MD 05/31/2015

## 2015-05-31 NOTE — Patient Instructions (Signed)
[facility] Discharge Instructions for Patients Receiving Chemotherapy  Today you received the following chemotherapy agents Oxaliplatin/Leucovorin/5 FU  To help prevent nausea and vomiting after your treatment, we encourage you to take your nausea medication as prescribed.   If you develop nausea and vomiting that is not controlled by your nausea medication, call the clinic.   BELOW ARE SYMPTOMS THAT SHOULD BE REPORTED IMMEDIATELY:  *FEVER GREATER THAN 100.5 F  *CHILLS WITH OR WITHOUT FEVER  NAUSEA AND VOMITING THAT IS NOT CONTROLLED WITH YOUR NAUSEA MEDICATION  *UNUSUAL SHORTNESS OF BREATH  *UNUSUAL BRUISING OR BLEEDING  TENDERNESS IN MOUTH AND THROAT WITH OR WITHOUT PRESENCE OF ULCERS  *URINARY PROBLEMS  *BOWEL PROBLEMS  UNUSUAL RASH Items with * indicate a potential emergency and should be followed up as soon as possible.  Feel free to call the clinic you have any questions or concerns. The clinic phone number is [phone].  Please show the CHEMO ALERT CARD at check-in to the Emergency Department and triage nurse.   

## 2015-06-01 ENCOUNTER — Telehealth: Payer: Self-pay | Admitting: *Deleted

## 2015-06-01 NOTE — Telephone Encounter (Signed)
Per staff message and POF I have scheduled appts. Advised scheduler of appts. JMW  

## 2015-06-02 ENCOUNTER — Ambulatory Visit (HOSPITAL_BASED_OUTPATIENT_CLINIC_OR_DEPARTMENT_OTHER): Payer: Medicare Other

## 2015-06-02 ENCOUNTER — Emergency Department (HOSPITAL_COMMUNITY)
Admission: EM | Admit: 2015-06-02 | Discharge: 2015-06-03 | Disposition: A | Discharge status: Home / Self Care | Payer: Medicare Other | Attending: Emergency Medicine | Admitting: Emergency Medicine

## 2015-06-02 ENCOUNTER — Encounter (HOSPITAL_COMMUNITY): Payer: Self-pay | Admitting: Emergency Medicine

## 2015-06-02 VITALS — BP 164/78 | HR 85 | Temp 98.3°F | Resp 18

## 2015-06-02 DIAGNOSIS — R569 Unspecified convulsions: Secondary | ICD-10-CM | POA: Insufficient documentation

## 2015-06-02 DIAGNOSIS — Z8673 Personal history of transient ischemic attack (TIA), and cerebral infarction without residual deficits: Secondary | ICD-10-CM | POA: Insufficient documentation

## 2015-06-02 DIAGNOSIS — Z7982 Long term (current) use of aspirin: Secondary | ICD-10-CM | POA: Insufficient documentation

## 2015-06-02 DIAGNOSIS — Z8701 Personal history of pneumonia (recurrent): Secondary | ICD-10-CM | POA: Diagnosis not present

## 2015-06-02 DIAGNOSIS — I10 Essential (primary) hypertension: Secondary | ICD-10-CM | POA: Insufficient documentation

## 2015-06-02 DIAGNOSIS — Z951 Presence of aortocoronary bypass graft: Secondary | ICD-10-CM | POA: Insufficient documentation

## 2015-06-02 DIAGNOSIS — Z923 Personal history of irradiation: Secondary | ICD-10-CM | POA: Insufficient documentation

## 2015-06-02 DIAGNOSIS — Z452 Encounter for adjustment and management of vascular access device: Secondary | ICD-10-CM

## 2015-06-02 DIAGNOSIS — Z955 Presence of coronary angioplasty implant and graft: Secondary | ICD-10-CM | POA: Diagnosis not present

## 2015-06-02 DIAGNOSIS — Z87891 Personal history of nicotine dependence: Secondary | ICD-10-CM | POA: Insufficient documentation

## 2015-06-02 DIAGNOSIS — C160 Malignant neoplasm of cardia: Secondary | ICD-10-CM | POA: Insufficient documentation

## 2015-06-02 DIAGNOSIS — I2510 Atherosclerotic heart disease of native coronary artery without angina pectoris: Secondary | ICD-10-CM | POA: Insufficient documentation

## 2015-06-02 DIAGNOSIS — Z9221 Personal history of antineoplastic chemotherapy: Secondary | ICD-10-CM | POA: Insufficient documentation

## 2015-06-02 DIAGNOSIS — Z9889 Other specified postprocedural states: Secondary | ICD-10-CM | POA: Diagnosis not present

## 2015-06-02 DIAGNOSIS — C159 Malignant neoplasm of esophagus, unspecified: Secondary | ICD-10-CM

## 2015-06-02 DIAGNOSIS — G40909 Epilepsy, unspecified, not intractable, without status epilepticus: Secondary | ICD-10-CM | POA: Diagnosis not present

## 2015-06-02 DIAGNOSIS — J449 Chronic obstructive pulmonary disease, unspecified: Secondary | ICD-10-CM | POA: Insufficient documentation

## 2015-06-02 LAB — I-STAT CHEM 8, ED
BUN: 16 mg/dL (ref 6–20)
CHLORIDE: 103 mmol/L (ref 101–111)
Calcium, Ion: 1.09 mmol/L — ABNORMAL LOW (ref 1.13–1.30)
Creatinine, Ser: 0.6 mg/dL — ABNORMAL LOW (ref 0.61–1.24)
GLUCOSE: 113 mg/dL — AB (ref 65–99)
HEMATOCRIT: 34 % — AB (ref 39.0–52.0)
HEMOGLOBIN: 11.6 g/dL — AB (ref 13.0–17.0)
POTASSIUM: 3.8 mmol/L (ref 3.5–5.1)
SODIUM: 141 mmol/L (ref 135–145)
TCO2: 27 mmol/L (ref 0–100)

## 2015-06-02 LAB — PHENYTOIN LEVEL, TOTAL: PHENYTOIN LVL: 7.1 ug/mL — AB (ref 10.0–20.0)

## 2015-06-02 LAB — CBG MONITORING, ED: GLUCOSE-CAPILLARY: 105 mg/dL — AB (ref 65–99)

## 2015-06-02 MED ORDER — HEPARIN SOD (PORK) LOCK FLUSH 100 UNIT/ML IV SOLN
500.0000 [IU] | Freq: Once | INTRAVENOUS | Status: AC | PRN
  Administered 2015-06-02: 500 [IU]
  Filled 2015-06-02: qty 5

## 2015-06-02 MED ORDER — SODIUM CHLORIDE 0.9 % IJ SOLN
10.0000 mL | INTRAMUSCULAR | Status: DC | PRN
  Administered 2015-06-02: 10 mL
  Filled 2015-06-02: qty 10

## 2015-06-02 MED ORDER — PHENYTOIN SODIUM EXTENDED 100 MG PO CAPS
200.0000 mg | ORAL_CAPSULE | Freq: Once | ORAL | Status: AC
  Administered 2015-06-03: 200 mg via ORAL
  Filled 2015-06-02: qty 2

## 2015-06-02 NOTE — ED Provider Notes (Signed)
CSN: [id number]     Arrival date & time 06/02/15  2221 History   First MD Initiated Contact with Patient 06/02/15 2238     Chief Complaint  Patient presents with  . Seizures    Pt has been having one a day since he started chemo     (Consider location/radiation/quality/duration/timing/severity/associated sxs/prior Treatment) HPI Comments: Patient presents emergency department with chief complaint of seizure. He has a history of seizures. Patient's wife witnesses seizure, and so that it was a full body seizure. Reportedly last about 30 seconds. Reportedly, the patient has one seizure per day since he started chemotherapy for esophageal cancer. Per EMS, was postictal for about 20 minutes.  Patient denies any chest pain, shortness of breath, or abdominal pain. He denies any injuries associated with the seizure.  The history is provided by the patient. No language interpreter was used.    Past Medical History  Diagnosis Date  . Coronary artery disease   . Shortness of breath   . Seizures ([place])   . Hypertension   . Pneumonia   . COPD (chronic obstructive pulmonary disease) ([place])   . Stroke ([place])   . Esophageal cancer ([place])   . Radiation 01/25/15-02/13/15    35 gray for esophageal cancer   Past Surgical History  Procedure Laterality Date  . Cardiac catheterization    . Coronary angioplasty with stent placement  07/22/2011  . Cardiac valve surgery    . Coronary artery bypass graft    . Lower extremity angiogram N/A 07/01/2011    Procedure: LOWER EXTREMITY ANGIOGRAM;  Surgeon: Laverda Page, MD;  Location: [facility] CATH LAB;  Service: Cardiovascular;  Laterality: N/A;  . Left heart catheterization with coronary angiogram N/A 07/01/2011    Procedure: LEFT HEART CATHETERIZATION WITH CORONARY ANGIOGRAM;  Surgeon: Laverda Page, MD;  Location: [facility] CATH LAB;  Service: Cardiovascular;  Laterality: N/A;  . Percutaneous coronary rotoblator intervention (pci-r) N/A 07/22/2011    Procedure:  PERCUTANEOUS CORONARY ROTOBLATOR INTERVENTION (PCI-R);  Surgeon: Laverda Page, MD;  Location: [facility] CATH LAB;  Service: Cardiovascular;  Laterality: N/A;  . Left heart catheterization with coronary angiogram N/A 10/30/2011    Procedure: LEFT HEART CATHETERIZATION WITH CORONARY ANGIOGRAM;  Surgeon: Laverda Page, MD;  Location: [facility] CATH LAB;  Service: Cardiovascular;  Laterality: N/A;  . Esophagogastroduodenoscopy  01/02/15   Family History  Problem Relation Age of Onset  . Heart disease Mother   . Hypertension Mother   . Heart disease Sister   . Hypertension Sister   . Heart disease Brother   . Hypertension Daughter    Social History  Substance Use Topics  . Smoking status: Former Smoker -- 0.50 packs/day for 50 years    Types: Cigarettes    Quit date: 12/27/2014  . Smokeless tobacco: Never Used  . Alcohol Use: 0.0 oz/week    0 Standard drinks or equivalent per week     Comment: occasional beer, 1-2 per week, used to drink daily heavily     Review of Systems  Constitutional: Negative for fever and chills.  Respiratory: Negative for shortness of breath.   Cardiovascular: Negative for chest pain.  Gastrointestinal: Negative for nausea, vomiting, diarrhea and constipation.  Genitourinary: Negative for dysuria.  Neurological: Positive for seizures.  All other systems reviewed and are negative.     Allergies  Review of patient's allergies indicates no known allergies.  Home Medications   Prior to Admission medications   Medication Sig Start Date End Date Taking? Authorizing  Provider  albuterol (PROVENTIL HFA;VENTOLIN HFA) 108 (90 BASE) MCG/ACT inhaler Inhale 1-2 puffs into the lungs every 6 (six) hours as needed for wheezing or shortness of breath. Reported on 05/17/2015    Historical Provider, MD  aspirin EC 81 MG tablet Take 81 mg by mouth daily.    Historical Provider, MD  feeding supplement (BOOST / RESOURCE BREEZE) LIQD Take 1 Container by mouth 3 (three) times daily  between meals. Patient taking differently: Take 1 Container by mouth 2 (two) times daily between meals.  03/26/15   Venetia Maxon Rama, MD  lidocaine-prilocaine (EMLA) cream Apply to affected area once Patient not taking: Reported on 05/06/2015 02/19/15   Truitt Merle, MD  mirtazapine (REMERON) 15 MG tablet Take 15 mg by mouth at bedtime.  03/14/15   Historical Provider, MD  morphine (ROXANOL) 20 MG/ML concentrated solution Take 0.25 mLs (5 mg total) by mouth every 6 (six) hours as needed for severe pain. Patient not taking: Reported on 05/17/2015 05/03/15   Truitt Merle, MD  phenytoin (DILANTIN) 100 MG ER capsule Take 100-200 mg by mouth 2 (two) times daily. He takes one capsule in the morning and two capsules at bedtime.    Historical Provider, MD  pravastatin (PRAVACHOL) 40 MG tablet Take 40 mg by mouth every evening.  01/08/15   Historical Provider, MD  PRESCRIPTION MEDICATION He receives his chemo treatments at the [facility] with Dr. Burr Medico. He is on a 14 day cycle of FOLFOX.    Historical Provider, MD  terazosin (HYTRIN) 10 MG capsule Take 10 mg by mouth at bedtime.  01/19/15   Historical Provider, MD   BP 169/95 mmHg  Pulse 75  Temp(Src) 98.2 F (36.8 C) (Oral)  Resp 13  Ht 5\' 8"  (1.727 m)  Wt 64.864 kg  BMI 21.75 kg/m2  SpO2 96% Physical Exam  Constitutional: He is oriented to person, place, and time. He appears well-developed and well-nourished.  HENT:  Head: Normocephalic and atraumatic.  Eyes: Conjunctivae and EOM are normal. Pupils are equal, round, and reactive to light. Right eye exhibits no discharge. Left eye exhibits no discharge. No scleral icterus.  Neck: Normal range of motion. Neck supple. No JVD present.  Cardiovascular: Normal rate, regular rhythm and normal heart sounds.  Exam reveals no gallop and no friction rub.   No murmur heard. Pulmonary/Chest: Effort normal and breath sounds normal. No respiratory distress. He has no wheezes. He has no rales.  He exhibits no tenderness.  Abdominal: Soft. He exhibits no distension and no mass. There is no tenderness. There is no rebound and no guarding.  Musculoskeletal: Normal range of motion. He exhibits no edema or tenderness.  Neurological: He is alert and oriented to person, place, and time.  Skin: Skin is warm and dry.  Psychiatric: He has a normal mood and affect. His behavior is normal. Judgment and thought content normal.  Nursing note and vitals reviewed.   ED Course  Procedures (including critical care time)  Results for orders placed or performed during the hospital encounter of 06/02/15  Dilantin (phenytoin) Level   (if pt is taking this medication)  Result Value Ref Range   Phenytoin Lvl 7.1 (L) 10.0 - 20.0 ug/mL  CBG monitoring, ED  Result Value Ref Range   Glucose-Capillary 105 (H) 65 - 99 mg/dL  I-stat chem 8, ed  Result Value Ref Range   Sodium 141 135 - 145 mmol/L   Potassium 3.8 3.5 - 5.1 mmol/L  Chloride 103 101 - 111 mmol/L   BUN 16 6 - 20 mg/dL   Creatinine, Ser 0.60 (L) 0.61 - 1.24 mg/dL   Glucose, Bld 113 (H) 65 - 99 mg/dL   Calcium, Ion 1.09 (L) 1.13 - 1.30 mmol/L   TCO2 27 0 - 100 mmol/L   Hemoglobin 11.6 (L) 13.0 - 17.0 g/dL   HCT 34.0 (L) 39.0 - 52.0 %      I have personally reviewed and evaluated these images and lab results as part of my medical decision-making.   EKG Interpretation None      MDM   Final diagnoses:  Seizures ([place])    Patient with seizure disorder. He is having seizures almost daily since starting his chemotherapy for esophageal cancer. Electrolytes and CBC here reassuring. Dilaudid level is slightly low. Question compliance. Patient discussed with Dr. Laneta Simmers, who agrees with plan to give patient regular dose, and discharged home.    Montine Circle, PA-C 06/03/15 [id number]  Leo Grosser, MD 06/03/15 [id number]

## 2015-06-02 NOTE — Patient Instructions (Signed)

## 2015-06-02 NOTE — ED Notes (Signed)
Pt from home via EMS with c/o a seizure about 9:40pm. Pt's wife saw the pt have a full body seizure while the pt was sleeping that lasted about 30 seconds. Per EMS, pt has had about 1 per day since he started chemo for esophageal cancer. Pt has no evident mouth cancer. Pt was post ictal when EMS arrived, but is now alert and oriented

## 2015-06-02 NOTE — ED Notes (Signed)
Bed: WA17 Expected date:  Expected time:  Means of arrival:  Comments: EMS 71 yo male from home/seizure

## 2015-06-03 NOTE — Discharge Instructions (Signed)

## 2015-06-04 NOTE — Telephone Encounter (Signed)
Left message for patient confirming January/February appointments and mailed schedule.

## 2015-06-06 DIAGNOSIS — R569 Unspecified convulsions: Secondary | ICD-10-CM | POA: Diagnosis not present

## 2015-06-12 ENCOUNTER — Ambulatory Visit (INDEPENDENT_AMBULATORY_CARE_PROVIDER_SITE_OTHER): Payer: Medicare Other | Admitting: Neurology

## 2015-06-12 ENCOUNTER — Encounter: Payer: Self-pay | Admitting: Neurology

## 2015-06-12 VITALS — BP 144/70 | HR 74 | Ht 70.0 in | Wt 148.0 lb

## 2015-06-12 DIAGNOSIS — Z8679 Personal history of other diseases of the circulatory system: Secondary | ICD-10-CM

## 2015-06-12 DIAGNOSIS — G40209 Localization-related (focal) (partial) symptomatic epilepsy and epileptic syndromes with complex partial seizures, not intractable, without status epilepticus: Secondary | ICD-10-CM | POA: Insufficient documentation

## 2015-06-12 DIAGNOSIS — C159 Malignant neoplasm of esophagus, unspecified: Secondary | ICD-10-CM | POA: Diagnosis not present

## 2015-06-12 MED ORDER — PHENYTOIN SODIUM EXTENDED 100 MG PO CAPS: ORAL_CAPSULE | ORAL | Status: DC

## 2015-06-12 NOTE — Progress Notes (Signed)
NEUROLOGY CONSULTATION NOTE  Andre Guzman MRN: [id number] DOB: 03/30/1946  Referring provider: Dr. Jovita Gamma Primary care provider: Dr. Glendale Chard  Reason for consult:  seizures  Dear Dr Sherwood Gambler:  Thank you for your kind referral of Andre Guzman for consultation of the above symptoms. Although his history is well known to you, please allow me to reiterate it for the purpose of our medical record. He is accompanied by his son who helps supplement the history today. Records and images were personally reviewed where available.  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 He is a poor historian, history obtained from records available and his wife on the phone. He presented to [facility] in March 2003 with a seizure and was found to have a subacute subdural hematoma. He underwent craniotomy and evacuation of the subdural hematoma, then was taken back to surgery 2 days later for reaccumulation and underwent craniectomy and evacuation. He returned in 01/2002 for a right frontoparietal cranioplasty with titanium mesh. 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 They were overall well-controlled on Dilantin, with nocturnal seizures occurring around once a year. He has been taking Dilantin 100mg  2 caps at night, and alternating 1 capsule then 2 capsules in the morning. He had been seizure-free for a year until 04/20/15 when he had a nocturnal seizure. He was brought to [facility] where CBC showed a WBC of 3.6, Hct 28.4, Plt 203, BMP unremarkable, Dilantin level 9.1. He was given additional IV Dilantin and discharged home on same dose. He was back in the ER on 05/06/15 with another seizure out of sleep. Total Dilantin level was 9.0, but  corrected Dilantin level was 13 and no changes were made. He was back in the ER on 06/02/15 for another nocturnal seizure. At that time, family was reporting one seizure a day since starting chemotherapy. Total dilantin level was 7.1. His family reports the last seizure was a week ago. He has no recollection of the seizures, and has woken up with urinary incontinence with some of them. He tells me his wife has noticed he may have a seizure after he drinks alcohol (beer), but denies significant alcohol intake.   His son denies any staring/unresponsive episodes. He denies any olfactory/gustatory hallucinations, deja vu, rising epigastric sensation, focal numbness/tingling/weakness, myoclonic jerks. He has occasional tingling and shooting pain down his right leg, occasional constipation. He denies any headaches, dizziness, diplopia, neck/back pain. No falls. He receives mFOLFOX6 every 2 weeks, last session was the day he came for seizure on 06/02/15. He reports his memory is not good. He does not drive.   Epilepsy Risk Factors:  History of subdural hemotoma s/p right frontoparietal cranioplasty. Otherwise he had a normal birth and early development.  There is no history of febrile convulsions, CNS infections such as meningitis/encephalitis, or family history of seizures.  Diagnostic Data: No prior EEG available for review. He had an MRI brain at Dr. Donnella Bi office done 05/25/15 which did not show any acute changes, there was diffuse atrophy with hydrocephalus ex vacuo, considerable brain substance loss both cortically and centrally. Chronic microvascular ischemic change is seen throughout with both focal and confluent areas of FLAIR hyperintensity in the white matter, remote right frontoparietal creniectomy with subsequent cranioplasty. 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  Laboratory Data:  Lab Results  Component Value Date   WBC 3.9* 05/31/2015   HGB  11.6* 06/02/2015   HCT 34.0* 06/02/2015   MCV 91.9 05/31/2015   PLT 128* 05/31/2015     Chemistry      Component Value Date/Time   NA 141 06/02/2015 2257   NA 142 05/31/2015 1019   K 3.8 06/02/2015 2257   K 4.0 05/31/2015 1019   CL 103 06/02/2015 2257   CO2 26 05/31/2015 1019   CO2 29 05/06/2015 0454   BUN 16 06/02/2015 2257   BUN 11.2 05/31/2015 1019   CREATININE 0.60* 06/02/2015 2257   CREATININE 0.8 05/31/2015 1019      Component Value Date/Time   CALCIUM 9.0 05/31/2015 1019   CALCIUM 8.8* 05/06/2015 0454   ALKPHOS 209* 05/31/2015 1019   ALKPHOS 162* 05/06/2015 0454   AST 24 05/31/2015 1019   AST 51* 05/06/2015 0454   ALT 11 05/31/2015 1019   ALT 22 05/06/2015 0454   BILITOT <0.30 05/31/2015 1019   BILITOT 0.7 05/06/2015 0454     Lab Results  Component Value Date   PHENYTOIN 7.1* 06/02/2015     PAST MEDICAL HISTORY: Past Medical History  Diagnosis Date  . Coronary artery disease   . Shortness of breath   . Seizures ([place])   . Hypertension   . Pneumonia   . COPD (chronic obstructive pulmonary disease) ([place])   . Stroke ([place])   . Esophageal cancer ([place])   . Radiation 01/25/15-02/13/15    34 gray for esophageal cancer    PAST SURGICAL HISTORY: Past Surgical History  Procedure Laterality Date  . Cardiac catheterization    . Coronary angioplasty with stent placement  07/22/2011  . Cardiac valve surgery    . Coronary artery bypass graft    . Lower extremity angiogram N/A 07/01/2011    Procedure: LOWER EXTREMITY ANGIOGRAM;  Surgeon: Laverda Page, MD;  Location: [facility] CATH LAB;  Service: Cardiovascular;  Laterality: N/A;  . Left heart catheterization with coronary angiogram N/A 07/01/2011    Procedure: LEFT HEART CATHETERIZATION WITH CORONARY ANGIOGRAM;  Surgeon: Laverda Page, MD;  Location:  [facility] CATH LAB;  Service: Cardiovascular;  Laterality: N/A;  . Percutaneous coronary rotoblator intervention (pci-r) N/A 07/22/2011    Procedure: PERCUTANEOUS CORONARY  ROTOBLATOR INTERVENTION (PCI-R);  Surgeon: Laverda Page, MD;  Location: [facility] CATH LAB;  Service: Cardiovascular;  Laterality: N/A;  . Left heart catheterization with coronary angiogram N/A 10/30/2011    Procedure: LEFT HEART CATHETERIZATION WITH CORONARY ANGIOGRAM;  Surgeon: Laverda Page, MD;  Location: [facility] CATH LAB;  Service: Cardiovascular;  Laterality: N/A;  . Esophagogastroduodenoscopy  01/02/15    MEDICATIONS: Current Outpatient Prescriptions on File Prior to Visit  Medication Sig Dispense Refill  . albuterol (PROVENTIL HFA;VENTOLIN HFA) 108 (90 BASE) MCG/ACT inhaler Inhale 1-2 puffs into the lungs every 6 (six) hours as needed for wheezing or shortness of breath. Reported on 05/17/2015    . aspirin EC 81 MG tablet Take 81 mg by mouth daily.    . feeding supplement (BOOST / RESOURCE BREEZE) LIQD Take 1 Container by mouth 3 (three) times daily between meals. (Patient taking differently: Take 1 Container by mouth 2 (two) times daily between meals. )  0  . mirtazapine (REMERON) 15 MG tablet Take 15 mg by mouth at bedtime.     . phenytoin (DILANTIN) 100 MG ER capsule Take 100-200 mg by  mouth 2 (two) times daily. He takes one capsule in the morning and two capsules at bedtime.    . pravastatin (PRAVACHOL) 40 MG tablet Take 40 mg by mouth every evening.     Marland Kitchen PRESCRIPTION MEDICATION He receives his chemo treatments at the [facility] with Dr. Burr Medico. He is on a 14 day cycle of FOLFOX.    Marland Kitchen terazosin (HYTRIN) 10 MG capsule Take 10 mg by mouth at bedtime.   0   No current facility-administered medications on file prior to visit.    ALLERGIES: No Known Allergies  FAMILY HISTORY: Family History  Problem Relation Age of Onset  . Heart disease Mother   . Hypertension Mother   . Heart disease Sister   . Hypertension Sister   . Heart disease Brother   . Hypertension Daughter     SOCIAL HISTORY: Social History   Social History  . Marital Status: Married     Spouse Name: N/A  . Number of Children: 4  . Years of Education: N/A   Occupational History  . Not on file.   Social History Main Topics  . Smoking status: Former Smoker -- 0.50 packs/day for 50 years    Types: Cigarettes    Quit date: 12/27/2014  . Smokeless tobacco: Never Used  . Alcohol Use: 0.0 oz/week    0 Standard drinks or equivalent per week     Comment: occasional beer, 1-2 per week, used to drink daily heavily   . Drug Use: No  . Sexual Activity: Not on file     Comment: did not ask   Other Topics Concern  . Not on file   Social History Narrative   Married, wife [name]   Independent in ADLs   # 4 children ( 1 girl and 3 boys)    REVIEW OF SYSTEMS: Constitutional: No fevers, chills, or sweats, no generalized fatigue, change in appetite Eyes: No visual changes, double vision, eye pain Ear, nose and throat: No hearing loss, ear pain, nasal congestion, sore throat Cardiovascular: No chest pain, palpitations Respiratory:  No shortness of breath at rest or with exertion, wheezes GastrointestinaI: No nausea, vomiting, diarrhea, abdominal pain, fecal incontinence Genitourinary:  No dysuria, urinary retention or frequency Musculoskeletal:  No neck pain, back pain Integumentary: No rash, pruritus, skin lesions Neurological: as above Psychiatric: No depression, insomnia, anxiety Endocrine: No palpitations, fatigue, diaphoresis, mood swings, change in appetite, change in weight, increased thirst Hematologic/Lymphatic:  No anemia, purpura, petechiae. Allergic/Immunologic: no itchy/runny eyes, nasal congestion, recent allergic reactions, rashes  PHYSICAL EXAM: Filed Vitals:   06/12/15 1313  BP: 144/70  Pulse: 74   General: No acute distress Head:  Normocephalic/atraumatic Eyes: Fundoscopic exam shows bilateral sharp discs, no vessel changes, exudates, or hemorrhages Neck: supple, no paraspinal tenderness, full range of motion Back: No paraspinal tenderness Heart:  regular rate and rhythm Lungs: Clear to auscultation bilaterally. Vascular: No carotid bruits. Skin/Extremities: No rash, no edema Neurological Exam: Mental status: alert and oriented to person, place, and time, no dysarthria or aphasia, Fund of knowledge is appropriate.  Recent and remote memory are intact. 2/3 delayed recall. Attention and concentration are normal.    Able to name objects and repeat phrases. Cranial nerves: CN I: not tested CN II: pupils equal, round and reactive to light, visual fields intact, fundi unremarkable. CN III, IV, VI:  full range of motion, no nystagmus, no ptosis CN V: facial sensation intact CN VII: upper and lower face symmetric  CN VIII: hearing intact to finger rub CN IX, X: gag intact, uvula midline CN XI: sternocleidomastoid and trapezius muscles intact CN XII: tongue midline Bulk & Tone: normal, no fasciculations. Motor: 5/5 throughout with no pronator drift. Sensation: intact to light touch, cold, pin, vibration and joint position sense.  No extinction to double simultaneous stimulation.  Romberg test negative Deep Tendon Reflexes: +1 throughout, no ankle clonus Plantar responses: downgoing bilaterally Cerebellar: no incoordination on finger to nose, heel to shin. No dysdiadochokinesia Gait: narrow-based and steady, able to tandem walk adequately. 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 His seizures had been well-controlled with around one seizure a year, however since December 2016, he has had 3 ER visits for seizures, which family feels occur the day/day after he receives chemotherapy. His brain MRI done 05/25/15 did not  show any metastatic disease or other acute changes. Chemotherapy can rarely cause seizures, Oxaliplatin has been rarely reported to cause seizures. Last seizure was a week ago. He is currently alternating days between 300mg  and 400mg  Dilantin dose, last Dilantin level was 7.1. Routine EEG will be ordered to further classify his seizures. We discussed options to increase Dilantin dose to 200mg  BID, although with nonlinear pharmacokinetics of Dilantin, I would recommend switching to a different anti-epileptic medication such as Keppra. He is hesitant to switch at this time, dose will be increased to 200mg  BID and repeat total and free Dilantin levels will be checked in 2 weeks. We discussed that if seizures continue on Dilantin, would highly recommend switching to Keppra. Side effects of Keppra were discussed. He is agreeable and will follow-up in 1 month. He knows to call for any changes. He is aware of [place] driving laws to stop driving after a seizure until 6 months seizure-free. He does not drive.   Thank you for allowing me to participate in the care of this patient. Please do not hesitate to call for any questions or concerns.   Ellouise Newer, M.D.  CC: Dr. Sherwood Gambler, Dr. Baird Cancer, Dr.

## 2015-06-12 NOTE — Patient Instructions (Signed)
1. Increase Phenytoin 100mg : Take 2 tablets twice a day 2. Check Dilantin level in 2 weeks, have blood drawn before you take your morning dose 3. Schedule routine EEG 4. MRI report from Dr. Sherwood Gambler will be requested for review 5. Follow-up in 1 month, call for any problems  Seizure Precuations 1. If medication has been prescribed for you to prevent seizures, take it exactly as directed.  Do not stop taking the medicine without talking to your doctor first, even if you have not had a seizure in a long time.   2. Avoid activities in which a seizure would cause danger to yourself or to others.  Don't operate dangerous machinery, swim alone, or climb in high or dangerous places, such as on ladders, roofs, or girders.  Do not drive unless your doctor says you may.  3. If you have any warning that you may have a seizure, lay down in a safe place where you can't hurt yourself.    4.  No driving for 6 months from last seizure, as per [facility].   Please refer to the following link on the [place] website for more information: http://[email or web address]/[email or web address]/drivingu.cfm   5.  Maintain good sleep hygiene. Avoid alcohol.  6.  Contact your doctor if you have any problems that may be related to the medicine you are taking.  7.  Call 911 and bring the patient back to the ED if:        A.  The seizure lasts longer than 5 minutes.       B.  The patient doesn't awaken shortly after the seizure  C.  The patient has new problems such as difficulty seeing, speaking or moving  D.  The patient was injured during the seizure  E.  The patient has a temperature over 102 F (39C)  F.  The patient vomited and now is having trouble breathing

## 2015-06-14 ENCOUNTER — Ambulatory Visit (HOSPITAL_BASED_OUTPATIENT_CLINIC_OR_DEPARTMENT_OTHER): Payer: Medicare Other

## 2015-06-14 ENCOUNTER — Other Ambulatory Visit (HOSPITAL_BASED_OUTPATIENT_CLINIC_OR_DEPARTMENT_OTHER): Payer: Medicare Other

## 2015-06-14 ENCOUNTER — Ambulatory Visit: Payer: Medicare Other | Admitting: Neurology

## 2015-06-14 VITALS — BP 171/72 | HR 71 | Temp 98.1°F | Resp 18

## 2015-06-14 DIAGNOSIS — C159 Malignant neoplasm of esophagus, unspecified: Secondary | ICD-10-CM

## 2015-06-14 DIAGNOSIS — Z5111 Encounter for antineoplastic chemotherapy: Secondary | ICD-10-CM

## 2015-06-14 DIAGNOSIS — C7800 Secondary malignant neoplasm of unspecified lung: Secondary | ICD-10-CM

## 2015-06-14 DIAGNOSIS — C155 Malignant neoplasm of lower third of esophagus: Secondary | ICD-10-CM

## 2015-06-14 LAB — COMPREHENSIVE METABOLIC PANEL
ALBUMIN: 2.8 g/dL — AB (ref 3.5–5.0)
ALK PHOS: 226 U/L — AB (ref 40–150)
ALT: 10 U/L (ref 0–55)
ANION GAP: 9 meq/L (ref 3–11)
AST: 24 U/L (ref 5–34)
BUN: 11.1 mg/dL (ref 7.0–26.0)
CALCIUM: 9 mg/dL (ref 8.4–10.4)
CO2: 29 mEq/L (ref 22–29)
CREATININE: 0.9 mg/dL (ref 0.7–1.3)
Chloride: 105 mEq/L (ref 98–109)
EGFR: >90 mL/min/{1.73_m2} (ref >90)
Glucose: 77 mg/dl (ref 70–140)
POTASSIUM: 4.5 meq/L (ref 3.5–5.1)
Sodium: 142 mEq/L (ref 136–145)
Total Bilirubin: <0.3 mg/dL (ref 0.20–1.20)
Total Protein: 8.1 g/dL (ref 6.4–8.3)

## 2015-06-14 LAB — CBC & DIFF AND RETIC
BASO%: 0.5 % (ref 0.0–2.0)
BASOS ABS: 0 10*3/uL (ref 0.0–0.1)
EOS%: 1 % (ref 0.0–7.0)
Eosinophils Absolute: 0 10*3/uL (ref 0.0–0.5)
HEMATOCRIT: 31.4 % — AB (ref 38.4–49.9)
HEMOGLOBIN: 10.6 g/dL — AB (ref 13.0–17.1)
Immature Retic Fract: 6.4 % (ref 3.00–10.60)
LYMPH#: 0.9 10*3/uL (ref 0.9–3.3)
LYMPH%: 21.5 % (ref 14.0–49.0)
MCH: 31.4 pg (ref 27.2–33.4)
MCHC: 33.8 g/dL (ref 32.0–36.0)
MCV: 92.9 fL (ref 79.3–98.0)
MONO#: 0.8 10*3/uL (ref 0.1–0.9)
MONO%: 19.3 % — AB (ref 0.0–14.0)
NEUT#: 2.3 10*3/uL (ref 1.5–6.5)
NEUT%: 57.7 % (ref 39.0–75.0)
PLATELETS: 101 10*3/uL — AB (ref 140–400)
RBC: 3.38 10*6/uL — ABNORMAL LOW (ref 4.20–5.82)
RDW: 18.6 % — ABNORMAL HIGH (ref 11.0–14.6)
RETIC %: 1.67 % (ref 0.80–1.80)
Retic Ct Abs: 56.45 10*3/uL (ref 34.80–93.90)
WBC: 4.1 10*3/uL (ref 4.0–10.3)
nRBC: 0 % (ref 0–0)

## 2015-06-14 MED ORDER — DEXTROSE 5 % IV SOLN
Freq: Once | INTRAVENOUS | Status: AC
  Administered 2015-06-14: 12:00:00 via INTRAVENOUS

## 2015-06-14 MED ORDER — OXALIPLATIN CHEMO INJECTION 100 MG/20ML
85.0000 mg/m2 | Freq: Once | INTRAVENOUS | Status: AC
  Administered 2015-06-14: 145 mg via INTRAVENOUS
  Filled 2015-06-14: qty 10

## 2015-06-14 MED ORDER — DEXAMETHASONE SODIUM PHOSPHATE 100 MG/10ML IJ SOLN
Freq: Once | INTRAMUSCULAR | Status: AC
  Administered 2015-06-14: 13:00:00 via INTRAVENOUS
  Filled 2015-06-14: qty 4

## 2015-06-14 MED ORDER — LEUCOVORIN CALCIUM INJECTION 100 MG
21.0000 mg/m2 | Freq: Once | INTRAMUSCULAR | Status: AC
Start: 2015-06-14 — End: 2015-06-14
  Administered 2015-06-14: 36 mg via INTRAVENOUS
  Filled 2015-06-14: qty 1.8

## 2015-06-14 MED ORDER — SODIUM CHLORIDE 0.9 % IV SOLN
2200.0000 mg/m2 | INTRAVENOUS | Status: DC
  Administered 2015-06-14: 3700 mg via INTRAVENOUS
  Filled 2015-06-14: qty 74

## 2015-06-14 NOTE — Patient Instructions (Signed)
[facility] Discharge Instructions for Patients Receiving Chemotherapy  Today you received the following chemotherapy agents: Oxaliplatin, Leucovorin, Adrucil.   To help prevent nausea and vomiting after your treatment, we encourage you to take your nausea medication as directed.    If you develop nausea and vomiting that is not controlled by your nausea medication, call the clinic.   BELOW ARE SYMPTOMS THAT SHOULD BE REPORTED IMMEDIATELY:  *FEVER GREATER THAN 100.5 F  *CHILLS WITH OR WITHOUT FEVER  NAUSEA AND VOMITING THAT IS NOT CONTROLLED WITH YOUR NAUSEA MEDICATION  *UNUSUAL SHORTNESS OF BREATH  *UNUSUAL BRUISING OR BLEEDING  TENDERNESS IN MOUTH AND THROAT WITH OR WITHOUT PRESENCE OF ULCERS  *URINARY PROBLEMS  *BOWEL PROBLEMS  UNUSUAL RASH Items with * indicate a potential emergency and should be followed up as soon as possible.  Feel free to call the clinic you have any questions or concerns. The clinic phone number is [phone].  Please show the CHEMO ALERT CARD at check-in to the Emergency Department and triage nurse.   

## 2015-06-14 NOTE — Progress Notes (Signed)
Blood return noted before and after Leucovorin push.  

## 2015-06-16 ENCOUNTER — Ambulatory Visit (HOSPITAL_BASED_OUTPATIENT_CLINIC_OR_DEPARTMENT_OTHER): Payer: Medicare Other

## 2015-06-16 VITALS — BP 163/84 | HR 84 | Temp 98.2°F | Resp 18

## 2015-06-16 DIAGNOSIS — C155 Malignant neoplasm of lower third of esophagus: Secondary | ICD-10-CM | POA: Diagnosis not present

## 2015-06-16 DIAGNOSIS — C159 Malignant neoplasm of esophagus, unspecified: Secondary | ICD-10-CM

## 2015-06-16 MED ORDER — HEPARIN SOD (PORK) LOCK FLUSH 100 UNIT/ML IV SOLN
500.0000 [IU] | Freq: Once | INTRAVENOUS | Status: AC | PRN
  Administered 2015-06-16: 500 [IU]
  Filled 2015-06-16: qty 5

## 2015-06-16 MED ORDER — SODIUM CHLORIDE 0.9 % IJ SOLN
10.0000 mL | INTRAMUSCULAR | Status: DC | PRN
  Administered 2015-06-16: 10 mL
  Filled 2015-06-16: qty 10

## 2015-06-25 DIAGNOSIS — C159 Malignant neoplasm of esophagus, unspecified: Secondary | ICD-10-CM | POA: Diagnosis not present

## 2015-06-28 ENCOUNTER — Telehealth: Payer: Self-pay | Admitting: Hematology

## 2015-06-28 ENCOUNTER — Ambulatory Visit: Payer: Medicare Other

## 2015-06-28 ENCOUNTER — Ambulatory Visit (HOSPITAL_BASED_OUTPATIENT_CLINIC_OR_DEPARTMENT_OTHER): Payer: Medicare Other | Admitting: Hematology

## 2015-06-28 ENCOUNTER — Encounter: Payer: Self-pay | Admitting: Hematology

## 2015-06-28 ENCOUNTER — Other Ambulatory Visit (HOSPITAL_BASED_OUTPATIENT_CLINIC_OR_DEPARTMENT_OTHER): Payer: Medicare Other

## 2015-06-28 VITALS — BP 150/80 | HR 83 | Temp 98.2°F | Resp 18 | Ht 70.0 in | Wt 148.3 lb

## 2015-06-28 DIAGNOSIS — C7951 Secondary malignant neoplasm of bone: Secondary | ICD-10-CM | POA: Diagnosis not present

## 2015-06-28 DIAGNOSIS — C155 Malignant neoplasm of lower third of esophagus: Secondary | ICD-10-CM

## 2015-06-28 DIAGNOSIS — D6481 Anemia due to antineoplastic chemotherapy: Secondary | ICD-10-CM | POA: Diagnosis not present

## 2015-06-28 DIAGNOSIS — C7800 Secondary malignant neoplasm of unspecified lung: Secondary | ICD-10-CM | POA: Diagnosis not present

## 2015-06-28 DIAGNOSIS — I10 Essential (primary) hypertension: Secondary | ICD-10-CM

## 2015-06-28 DIAGNOSIS — C159 Malignant neoplasm of esophagus, unspecified: Secondary | ICD-10-CM

## 2015-06-28 DIAGNOSIS — D630 Anemia in neoplastic disease: Secondary | ICD-10-CM

## 2015-06-28 DIAGNOSIS — I5033 Acute on chronic diastolic (congestive) heart failure: Secondary | ICD-10-CM

## 2015-06-28 DIAGNOSIS — I2510 Atherosclerotic heart disease of native coronary artery without angina pectoris: Secondary | ICD-10-CM

## 2015-06-28 LAB — CBC & DIFF AND RETIC
BASO%: 0 % (ref 0.0–2.0)
BASOS ABS: 0 10*3/uL (ref 0.0–0.1)
EOS ABS: 0 10*3/uL (ref 0.0–0.5)
EOS%: 1 % (ref 0.0–7.0)
HEMATOCRIT: 34.5 % — AB (ref 38.4–49.9)
HEMOGLOBIN: 11.7 g/dL — AB (ref 13.0–17.1)
IMMATURE RETIC FRACT: 9.8 % (ref 3.00–10.60)
LYMPH%: 26.5 % (ref 14.0–49.0)
MCH: 32 pg (ref 27.2–33.4)
MCHC: 33.9 g/dL (ref 32.0–36.0)
MCV: 94.3 fL (ref 79.3–98.0)
MONO#: 0.6 10*3/uL (ref 0.1–0.9)
MONO%: 15.7 % — ABNORMAL HIGH (ref 0.0–14.0)
NEUT%: 56.8 % (ref 39.0–75.0)
NEUTROS ABS: 2.2 10*3/uL (ref 1.5–6.5)
Platelets: 84 10*3/uL — ABNORMAL LOW (ref 140–400)
RBC: 3.66 10*6/uL — ABNORMAL LOW (ref 4.20–5.82)
RDW: 18.1 % — ABNORMAL HIGH (ref 11.0–14.6)
RETIC %: 1.53 % (ref 0.80–1.80)
Retic Ct Abs: 56 10*3/uL (ref 34.80–93.90)
WBC: 3.8 10*3/uL — AB (ref 4.0–10.3)
lymph#: 1 10*3/uL (ref 0.9–3.3)

## 2015-06-28 LAB — COMPREHENSIVE METABOLIC PANEL
ALBUMIN: 3 g/dL — AB (ref 3.5–5.0)
ALK PHOS: 248 U/L — AB (ref 40–150)
ALT: 13 U/L (ref 0–55)
AST: 27 U/L (ref 5–34)
Anion Gap: 11 mEq/L (ref 3–11)
BILIRUBIN TOTAL: 0.3 mg/dL (ref 0.20–1.20)
BUN: 10.8 mg/dL (ref 7.0–26.0)
CO2: 28 mEq/L (ref 22–29)
Calcium: 9.4 mg/dL (ref 8.4–10.4)
Chloride: 105 mEq/L (ref 98–109)
Creatinine: 0.9 mg/dL (ref 0.7–1.3)
EGFR: >90 mL/min/{1.73_m2} (ref >90)
GLUCOSE: 180 mg/dL — AB (ref 70–140)
Potassium: 3.8 mEq/L (ref 3.5–5.1)
SODIUM: 143 meq/L (ref 136–145)
TOTAL PROTEIN: 8.6 g/dL — AB (ref 6.4–8.3)

## 2015-06-28 MED ORDER — LISINOPRIL 10 MG PO TABS
10.0000 mg | ORAL_TABLET | Freq: Every day | ORAL | Status: AC
Start: 2015-06-28 — End: ?

## 2015-06-28 NOTE — Telephone Encounter (Signed)
per pof tos ch pt appt-sent MW email to sch trmt-will call pt after reply °

## 2015-06-28 NOTE — Progress Notes (Addendum)
[place]  [phone] [phone]  Clinic Follow up Note   Patient Care Team: Glendale Chard, MD as PCP - General (Internal Medicine) Adrian Prows, MD as Consulting Physician (Cardiology) Jovita Gamma, MD as Consulting Physician (Neurosurgery) Lowella Bandy, MD as Consulting Physician (Urology) Grace Isaac, MD as Consulting Physician (Cardiothoracic Surgery) Truitt Merle, MD as Consulting Physician (Hematology) Tania Ade, RN as Registered Nurse Carol Ada, MD as Consulting Physician (Gastroenterology) Gery Pray, MD as Consulting Physician (Radiation Oncology) 06/28/2015  CHIEF COMPLAINTS:  Follow up esophageal squamous cell carcinoma  Oncology History   Squamous cell esophageal cancer   Staging form: Esophagus - Squamous Cell Carcinoma, AJCC 7th Edition     Clinical: Stage IV (TX, N0, M1) - Unsigned       Squamous cell esophageal cancer ([place])   01/02/2015 Initial Diagnosis Squamous cell esophageal cancer   01/02/2015 Procedure EGD by Dr. Benson Norway showed a large, fungating mass with no bleeding and with stigmata met of recent bleeding was found in the third of the esophagus. The mass was completely obstructing and circumferential. The stomach and examined duodenum were normal.   01/02/2015 Imaging CT CAP showed 9 cm segment of irregular his social worker thickening. Single enlarged subcarinal lymph node, bilateral pulmonary nodules (3), 50m right middle lobe next to right atrium, 3 RUL and a 7 mm lingular lung nodules.    01/02/2015 Initial Biopsy Esophageal mass biopsy showed poorly differentiated squamous cell carcinoma.   01/18/2015 Imaging PET scan showed hypermetabolic esophageal mass, subcarinal and possible right hilar adenopathy, several lung nodules, and a hypermetabolic bone lesion in right ilium, compatible with metastatic disease.    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    HISTORY OF PRESENTING ILLNESS:  Andre FINNIGAN70y.o. male is here because of recently diagnosed esophageal squamous cell carcinoma.  He has had dysphagia for one month, with solid food mainly, he is able to keep liquid and soft diet down. He lost about 30 lbs in the the past month, but his weight has been stable in the past week since he started taking boost. He had some chest pain when he swallows. He is constipated, on stool softener, no bleeding.  He otherwise feels fine. His energy level is fair, he functions very well, no limitation on his physical activities. No nausea, no other pain, cough or other, he drinks about 2 boost a day, soup,and some soft diet.  He was referred to gastroenterologist Dr. HBenson Norwayby his primary care physician. He underwent EGD which showed a circumferential, obstructing, fungating mass in the third esophagus, biopsy showed poorly differentiated squamous cell carcinoma. CT scan chest abdomen and pelvis showed 3 lung nodules, with the largest 1.2 cm in the right middle lobe next to the right atrium.  CURRENT THERAPY: 70first line chemotherapy with mFOLFOX6, no 5-fu bolus, started on 02/22/15   INTERIM HISTORY 70yjaereturns for follow-up and chemo.  He presents to the clinic by himself  Today. He is doing very well, tolerating chemo well without signifciant side effects.  He denies dysphgia, pain,nausea , or other symptoms. He has good appetite and energy level,tolerating routine activities without much difficulty.  MEDICAL HISTORY:  Past Medical History  Diagnosis Date  . Coronary artery disease   . Shortness of breath   . Seizures ([place])   . Hypertension   . Pneumonia   . COPD (chronic obstructive pulmonary  disease) ([place])   . Stroke ([place])   . Esophageal cancer ([place])   . Radiation 01/25/15-02/13/15    35 gray for esophageal cancer    SURGICAL HISTORY: Past Surgical History  Procedure Laterality Date  . Cardiac catheterization    . Coronary angioplasty with stent placement  07/22/2011  . Cardiac valve surgery    . Coronary artery bypass graft    . Lower extremity angiogram N/A 07/01/2011    Procedure: LOWER EXTREMITY ANGIOGRAM;  Surgeon: Laverda Page, MD;  Location: [facility] CATH LAB;  Service: Cardiovascular;  Laterality: N/A;  . Left heart catheterization with coronary angiogram N/A 07/01/2011    Procedure: LEFT HEART CATHETERIZATION WITH CORONARY ANGIOGRAM;  Surgeon: Laverda Page, MD;  Location: [facility] CATH LAB;  Service: Cardiovascular;  Laterality: N/A;  . Percutaneous coronary rotoblator intervention (pci-r) N/A 07/22/2011    Procedure: PERCUTANEOUS CORONARY ROTOBLATOR INTERVENTION (PCI-R);  Surgeon: Laverda Page, MD;  Location: [facility] CATH LAB;  Service: Cardiovascular;  Laterality: N/A;  . Left heart catheterization with coronary angiogram N/A 10/30/2011    Procedure: LEFT HEART CATHETERIZATION WITH CORONARY ANGIOGRAM;  Surgeon: Laverda Page, MD;  Location: [facility] CATH LAB;  Service: Cardiovascular;  Laterality: N/A;  . Esophagogastroduodenoscopy  01/02/15    SOCIAL HISTORY: Social History   Social History  . Marital Status: Married, lives with his wife     Spouse Name: N/A  . Number of Children: 42, 1 daughter and 3 sons, all lives in [place]   . Years of Education: N/A   Occupational History  . Retired Nature conservation officer    Social History Main Topics  . Smoking status: Current Every Day Smoker -- 0.50 packs/day for 50 years    Types: Cigarettes  . Smokeless tobacco: Never Used  . 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 Drug Use: No  . Sexual Activity: Not on file     Comment: did not ask   Other Topics Concern  . Not  on file   Social History Narrative    FAMILY HISTORY: Family History  Problem Relation Age of Onset  . Heart disease Mother   . Hypertension Mother   . Heart disease Sister   . Hypertension Sister   . Heart disease Brother   . Hypertension Daughter     ALLERGIES:  has No Known Allergies.  MEDICATIONS:  Current Outpatient Prescriptions  Medication Sig Dispense Refill  . albuterol (PROVENTIL HFA;VENTOLIN HFA) 108 (90 BASE) MCG/ACT inhaler Inhale 1-2 puffs into the lungs every 6 (six) hours as needed for wheezing or shortness of breath. Reported on 05/17/2015    . aspirin EC 81 MG tablet Take 81 mg by mouth daily.    . feeding supplement (BOOST / RESOURCE BREEZE) LIQD Take 1 Container by mouth 3 (three) times daily between meals. (Patient taking differently: Take 1 Container by mouth 2 (two) times daily between meals. )  0  . mirtazapine (REMERON) 15 MG tablet Take 15 mg by mouth at bedtime.     . phenytoin (DILANTIN) 100 MG ER capsule Take 2 caps twice a day 120 capsule 4  . pravastatin (PRAVACHOL) 40 MG tablet Take 40 mg by mouth every evening.     Marland Kitchen  PRESCRIPTION MEDICATION He receives his chemo treatments at the [facility] with Dr. Burr Medico. He is on a 14 day cycle of FOLFOX.    Marland Kitchen terazosin (HYTRIN) 10 MG capsule Take 10 mg by mouth at bedtime.   0   No current facility-administered medications for this visit.    REVIEW OF SYSTEMS:   Constitutional: Denies fevers, chills or abnormal night sweats, (+) weight loss  Eyes: Denies blurriness of vision, double vision or watery eyes Ears, nose, mouth, throat, and face: Denies mucositis or sore throat Respiratory: Denies cough, dyspnea or wheezes Cardiovascular: Denies palpitation, chest discomfort or lower extremity swelling Gastrointestinal:  (+) Dysphagia, mild heartburn, Denies nausea, vomiting or change in bowel habits Skin: Denies abnormal skin rashes Lymphatics: Denies new lymphadenopathy or easy  bruising Neurological:Denies numbness, tingling or new weaknesses Behavioral/Psych: Mood is stable, no new changes  All other systems were reviewed with the patient and are negative.  PHYSICAL EXAMINATION: ECOG PERFORMANCE STATUS: 1 - Symptomatic but completely ambulatory  Filed Vitals:   06/28/15 0932  BP: 150/80  Pulse: 83  Temp: 98.2 F (36.8 C)  Resp: 18   Filed Weights   06/28/15 0932  Weight: 148 lb 4.8 oz (67.268 kg)    GENERAL:alert, no distress and comfortable SKIN: skin color, texture, turgor are normal, no rashes or significant lesions EYES: normal, conjunctiva are pink and non-injected, sclera clear OROPHARYNX:no exudate, no erythema and lips, buccal mucosa, and tongue normal  NECK: supple, thyroid normal size, non-tender, without nodularity LYMPH:  no palpable lymphadenopathy in the cervical, axillary or inguinal LUNGS: clear to auscultation and percussion with normal breathing effort HEART: regular rate & rhythm and no murmurs and no lower extremity edema ABDOMEN:abdomen soft, non-tender and normal bowel sounds Musculoskeletal:no cyanosis of digits and no clubbing  PSYCH: alert & oriented x 3 with fluent speech NEURO: no focal motor/sensory deficits  LABORATORY DATA:  I have reviewed the data as listed CBC Latest Ref Rng 06/28/2015 06/14/2015 06/02/2015  WBC 4.0 - 10.3 10e3/uL 3.8(L) 4.1 -  Hemoglobin 13.0 - 17.1 g/dL 11.7(L) 10.6(L) 11.6(L)  Hematocrit 38.4 - 49.9 % 34.5(L) 31.4(L) 34.0(L)  Platelets 140 - 400 10e3/uL 84(L) 101(L) -    CMP Latest Ref Rng 06/28/2015 06/14/2015 06/02/2015  Glucose 70 - 140 mg/dl 180(H) 77 113(H)  BUN 7.0 - 26.0 mg/dL 10.8 11.1 16  Creatinine 0.7 - 1.3 mg/dL 0.9 0.9 0.60(L)  Sodium 136 - 145 mEq/L 143 142 141  Potassium 3.5 - 5.1 mEq/L 3.8 4.5 3.8  Chloride 101 - 111 mmol/L - - 103  CO2 22 - 29 mEq/L 28 29 -  Calcium 8.4 - 10.4 mg/dL 9.4 9.0 -  Total Protein 6.4 - 8.3 g/dL 8.6(H) 8.1 -  Total Bilirubin 0.20 - 1.20 mg/dL 0.30  <0.30 -  Alkaline Phos 40 - 150 U/L 248(H) 226(H) -  AST 5 - 34 U/L 27 24 -  ALT 0 - 55 U/L 13 10 -   ANC 2.4 today  Pathology report  Diagnosis 01/26/2015 Bone, biopsy, r iliac - 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 Microscopic Comment The metastatic carcinoma is positive by immunohistochemistry with p63, cytokeratin 903 and cytokeratin 5/6. The tumor is negative with CD56, synaptophysin, chromogranin, thyroid transcription factor-1 and S100. (JDP:gt, 01/30/15)   RADIOGRAPHIC STUDIES: I have personally reviewed the radiological images as listed and agreed with the findings in the report.  PET 01/18/2015 IMPRESSION: 1. Previously described esophageal mass is diffusely hypermetabolic. Today's study  demonstrates associated hypermetabolic subcarinal and possible right hilar lymphadenopathy, several pulmonary nodules in the lungs bilaterally (largest of which are hypermetabolic), and a hypermetabolic lesion in the superior aspect of the right ilium, compatible with metastatic disease. 2. Atherosclerosis, including left main and 3 vessel coronary artery disease. 3. Additional incidental findings, as above.  PET 05/29/2015 IMPRESSION: Mixed response to therapy, with marked decrease in hypermetabolic esophageal mass and resolution of mild mediastinal and right hilar lymphadenopathy.  Mild progression of bilateral pulmonary metastases.  Mild progression of bone metastases in the right ilium and right posterior twelfth rib.   ASSESSMENT & PLAN: 70 year old African-American male, with past medical history of hypertension, history of stroke, coronary artery disease, who presents with progressive dysphasia.  1. 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 -I discussed his right iliac bone biopsy  results with patient and his son, unfortunately biopsy confirmed metastatic squamous cell carcinoma. -We reviewed the natural course of esophagitis cancer, and the incurable nature of his disease. We emphasized the goal of therapy is palliation and prolonged his life. -He has completed a short course of palliative radiation  -He is on first-line FOLFOX, tolerating well, overall feels better -I previously discussed his restaging PET/CT result and review the image personally with patient and his wife. His primary esophageal tumor and paraesophageal adenopathy has significantly improved. His pulmonary nodules are stable, right iliac bone has increased metabolic activity. His previous PET scan was done we'll months before he started chemotherapy. I do not think he has significant disease progression. -He is clinically doing well, tolerating chemotherapy very well, we'll continue FOLFOX. -due to thrombocytopenia, I'll postpone  His chemotherapy to next week, and slightly decrease his 5-fu dose   2. Dysphagia and weight loss -Dysphasia has resolved since he started chemotherapy  -secondary to #1, improved since he started chemo, he has gained weight back  -Continue mirtazapine  3. HTN, CAD, history of seizure and CVA -Follow up with primary care physician -He recently had an episode of seizure, his Dilantin dose was adjusted.  -his BP has been persistently elevated lately, he was on Maxzide before which has been held since his cancer treatment. I recommend him to start lisinopril, prescription was called in today    4. 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 The repeat Lyme study today results still pending -His mild anemia has been stable, no need for transfusion, we'll continue monitoring  5. Mild thrombocytopenia, secondary to chemotherapy -Continue  monitoring   Plan -postpone 9th cycle FOLFOX to next week,, no 5-fu bolus or neulasta, 5-fu dose reduction to 2229m/m2  -I called in lisinopril 146mdaily for him today, he will start  -I will see him back in 3 weeks   All questions were answered. The patient knows to call the clinic with any problems, questions or concerns.  I spent 20 minutes counseling the patient face to face. The total time spent in the appointment was 25 minutes and more than 50% was on counseling.     FeTruitt MerleMD 06/28/2015

## 2015-07-05 ENCOUNTER — Ambulatory Visit (HOSPITAL_BASED_OUTPATIENT_CLINIC_OR_DEPARTMENT_OTHER): Payer: Medicare Other

## 2015-07-05 ENCOUNTER — Other Ambulatory Visit (HOSPITAL_BASED_OUTPATIENT_CLINIC_OR_DEPARTMENT_OTHER): Payer: Medicare Other

## 2015-07-05 ENCOUNTER — Other Ambulatory Visit: Payer: Self-pay | Admitting: Hematology

## 2015-07-05 ENCOUNTER — Telehealth: Payer: Self-pay | Admitting: Hematology

## 2015-07-05 ENCOUNTER — Ambulatory Visit: Payer: Medicare Other

## 2015-07-05 VITALS — BP 166/78 | HR 77 | Temp 98.2°F | Resp 18

## 2015-07-05 DIAGNOSIS — C159 Malignant neoplasm of esophagus, unspecified: Secondary | ICD-10-CM

## 2015-07-05 DIAGNOSIS — C7800 Secondary malignant neoplasm of unspecified lung: Secondary | ICD-10-CM | POA: Diagnosis not present

## 2015-07-05 DIAGNOSIS — C155 Malignant neoplasm of lower third of esophagus: Secondary | ICD-10-CM | POA: Diagnosis not present

## 2015-07-05 DIAGNOSIS — C7951 Secondary malignant neoplasm of bone: Secondary | ICD-10-CM | POA: Diagnosis not present

## 2015-07-05 DIAGNOSIS — Z5111 Encounter for antineoplastic chemotherapy: Secondary | ICD-10-CM

## 2015-07-05 DIAGNOSIS — Z95828 Presence of other vascular implants and grafts: Secondary | ICD-10-CM

## 2015-07-05 LAB — CBC & DIFF AND RETIC
BASO%: 0.6 % (ref 0.0–2.0)
Basophils Absolute: 0 10*3/uL (ref 0.0–0.1)
EOS ABS: 0 10*3/uL (ref 0.0–0.5)
EOS%: 1.2 % (ref 0.0–7.0)
HCT: 31.8 % — ABNORMAL LOW (ref 38.4–49.9)
HEMOGLOBIN: 10.8 g/dL — AB (ref 13.0–17.1)
IMMATURE RETIC FRACT: 4.8 % (ref 3.00–10.60)
LYMPH%: 30.1 % (ref 14.0–49.0)
MCH: 31.6 pg (ref 27.2–33.4)
MCHC: 34 g/dL (ref 32.0–36.0)
MCV: 93 fL (ref 79.3–98.0)
MONO#: 0.6 10*3/uL (ref 0.1–0.9)
MONO%: 17.3 % — AB (ref 0.0–14.0)
NEUT%: 50.8 % (ref 39.0–75.0)
NEUTROS ABS: 1.7 10*3/uL (ref 1.5–6.5)
PLATELETS: 147 10*3/uL (ref 140–400)
RBC: 3.42 10*6/uL — AB (ref 4.20–5.82)
RDW: 17.6 % — AB (ref 11.0–14.6)
Retic %: 0.96 % (ref 0.80–1.80)
Retic Ct Abs: 32.83 10*3/uL — ABNORMAL LOW (ref 34.80–93.90)
WBC: 3.4 10*3/uL — AB (ref 4.0–10.3)
lymph#: 1 10*3/uL (ref 0.9–3.3)
nRBC: 0 % (ref 0–0)

## 2015-07-05 LAB — COMPREHENSIVE METABOLIC PANEL
ALBUMIN: 2.7 g/dL — AB (ref 3.5–5.0)
ALK PHOS: 234 U/L — AB (ref 40–150)
ALT: 11 U/L (ref 0–55)
ANION GAP: 9 meq/L (ref 3–11)
AST: 25 U/L (ref 5–34)
BUN: 10.7 mg/dL (ref 7.0–26.0)
CO2: 27 mEq/L (ref 22–29)
Calcium: 8.9 mg/dL (ref 8.4–10.4)
Chloride: 107 mEq/L (ref 98–109)
Creatinine: 0.9 mg/dL (ref 0.7–1.3)
Glucose: 73 mg/dl (ref 70–140)
POTASSIUM: 3.7 meq/L (ref 3.5–5.1)
Sodium: 142 mEq/L (ref 136–145)
TOTAL PROTEIN: 7.9 g/dL (ref 6.4–8.3)

## 2015-07-05 MED ORDER — OXALIPLATIN CHEMO INJECTION 100 MG/20ML
85.0000 mg/m2 | Freq: Once | INTRAVENOUS | Status: AC
  Administered 2015-07-05: 145 mg via INTRAVENOUS
  Filled 2015-07-05: qty 20

## 2015-07-05 MED ORDER — LEUCOVORIN CALCIUM INJECTION 100 MG: 20.0000 mg/m2 | Freq: Once | INTRAMUSCULAR | Status: DC

## 2015-07-05 MED ORDER — SODIUM CHLORIDE 0.9 % IV SOLN
Freq: Once | INTRAVENOUS | Status: AC
  Administered 2015-07-05: 15:00:00 via INTRAVENOUS
  Filled 2015-07-05: qty 4

## 2015-07-05 MED ORDER — LEUCOVORIN CALCIUM INJECTION 350 MG
400.0000 mg/m2 | Freq: Once | INTRAVENOUS | Status: AC
  Administered 2015-07-05: 676 mg via INTRAVENOUS
  Filled 2015-07-05: qty 33.8

## 2015-07-05 MED ORDER — DEXTROSE 5 % IV SOLN
Freq: Once | INTRAVENOUS | Status: AC
  Administered 2015-07-05: 14:00:00 via INTRAVENOUS

## 2015-07-05 MED ORDER — SODIUM CHLORIDE 0.9% FLUSH
10.0000 mL | INTRAVENOUS | Status: DC | PRN
  Administered 2015-07-05: 10 mL via INTRAVENOUS
  Filled 2015-07-05: qty 10

## 2015-07-05 MED ORDER — SODIUM CHLORIDE 0.9 % IV SOLN
2200.0000 mg/m2 | INTRAVENOUS | Status: DC
  Administered 2015-07-05: 3700 mg via INTRAVENOUS
  Filled 2015-07-05: qty 74

## 2015-07-05 NOTE — Progress Notes (Signed)
Reviewed labs with Dr. Burr Medico, okay to proceed with treatment today. Per Dr. Burr Medico Adrucil pump to be adjusted/programmed to come off at 1300 on 07/07/15 due to clinic hours.

## 2015-07-05 NOTE — Telephone Encounter (Signed)
Added labs/flush per 02/09 POF, s/w desk chemo nurse in charge added chemo today... KJ

## 2015-07-05 NOTE — Patient Instructions (Signed)
[facility] Discharge Instructions for Patients Receiving Chemotherapy  Today you received the following chemotherapy agents: Leucovorin, Oxaliplatin, Adrucil   To help prevent nausea and vomiting after your treatment, we encourage you to take your nausea medication as directed.    If you develop nausea and vomiting that is not controlled by your nausea medication, call the clinic.   BELOW ARE SYMPTOMS THAT SHOULD BE REPORTED IMMEDIATELY:  *FEVER GREATER THAN 100.5 F  *CHILLS WITH OR WITHOUT FEVER  NAUSEA AND VOMITING THAT IS NOT CONTROLLED WITH YOUR NAUSEA MEDICATION  *UNUSUAL SHORTNESS OF BREATH  *UNUSUAL BRUISING OR BLEEDING  TENDERNESS IN MOUTH AND THROAT WITH OR WITHOUT PRESENCE OF ULCERS  *URINARY PROBLEMS  *BOWEL PROBLEMS  UNUSUAL RASH Items with * indicate a potential emergency and should be followed up as soon as possible.  Feel free to call the clinic you have any questions or concerns. The clinic phone number is [phone].  Please show the [facility] at check-in to the Emergency Department and triage nurse.

## 2015-07-07 ENCOUNTER — Ambulatory Visit (HOSPITAL_BASED_OUTPATIENT_CLINIC_OR_DEPARTMENT_OTHER): Payer: Medicare Other

## 2015-07-07 VITALS — BP 149/80 | HR 91 | Temp 98.6°F | Resp 18 | Ht 70.0 in

## 2015-07-07 DIAGNOSIS — C159 Malignant neoplasm of esophagus, unspecified: Secondary | ICD-10-CM

## 2015-07-07 DIAGNOSIS — Z452 Encounter for adjustment and management of vascular access device: Secondary | ICD-10-CM | POA: Diagnosis not present

## 2015-07-07 DIAGNOSIS — C155 Malignant neoplasm of lower third of esophagus: Secondary | ICD-10-CM | POA: Diagnosis not present

## 2015-07-07 MED ORDER — HEPARIN SOD (PORK) LOCK FLUSH 100 UNIT/ML IV SOLN
500.0000 [IU] | Freq: Once | INTRAVENOUS | Status: AC | PRN
  Administered 2015-07-07: 500 [IU]
  Filled 2015-07-07: qty 5

## 2015-07-07 MED ORDER — SODIUM CHLORIDE 0.9 % IJ SOLN
10.0000 mL | INTRAMUSCULAR | Status: DC | PRN
  Administered 2015-07-07: 10 mL
  Filled 2015-07-07: qty 10

## 2015-07-12 ENCOUNTER — Other Ambulatory Visit (HOSPITAL_BASED_OUTPATIENT_CLINIC_OR_DEPARTMENT_OTHER): Payer: Medicare Other

## 2015-07-12 DIAGNOSIS — C155 Malignant neoplasm of lower third of esophagus: Secondary | ICD-10-CM | POA: Diagnosis not present

## 2015-07-12 DIAGNOSIS — C159 Malignant neoplasm of esophagus, unspecified: Secondary | ICD-10-CM

## 2015-07-12 LAB — COMPREHENSIVE METABOLIC PANEL
ALT: 15 U/L (ref 0–55)
AST: 32 U/L (ref 5–34)
Albumin: 2.8 g/dL — ABNORMAL LOW (ref 3.5–5.0)
Alkaline Phosphatase: 246 U/L — ABNORMAL HIGH (ref 40–150)
Anion Gap: 10 mEq/L (ref 3–11)
BUN: 12.3 mg/dL (ref 7.0–26.0)
CHLORIDE: 105 meq/L (ref 98–109)
CO2: 25 meq/L (ref 22–29)
CREATININE: 0.8 mg/dL (ref 0.7–1.3)
Calcium: 8.8 mg/dL (ref 8.4–10.4)
EGFR: >90 mL/min/{1.73_m2} (ref >90)
GLUCOSE: 161 mg/dL — AB (ref 70–140)
POTASSIUM: 3.7 meq/L (ref 3.5–5.1)
SODIUM: 140 meq/L (ref 136–145)
Total Bilirubin: <0.3 mg/dL (ref 0.20–1.20)
Total Protein: 8.1 g/dL (ref 6.4–8.3)

## 2015-07-12 LAB — CBC & DIFF AND RETIC
BASO%: 0.4 % (ref 0.0–2.0)
BASOS ABS: 0 10*3/uL (ref 0.0–0.1)
EOS%: 0.4 % (ref 0.0–7.0)
Eosinophils Absolute: 0 10*3/uL (ref 0.0–0.5)
HEMATOCRIT: 31.8 % — AB (ref 38.4–49.9)
HGB: 10.8 g/dL — ABNORMAL LOW (ref 13.0–17.1)
Immature Retic Fract: 0 % — ABNORMAL LOW (ref 3.00–10.60)
LYMPH#: 0.7 10*3/uL — AB (ref 0.9–3.3)
LYMPH%: 27 % (ref 14.0–49.0)
MCH: 31.6 pg (ref 27.2–33.4)
MCHC: 34 g/dL (ref 32.0–36.0)
MCV: 93 fL (ref 79.3–98.0)
MONO#: 0.2 10*3/uL (ref 0.1–0.9)
MONO%: 6.6 % (ref 0.0–14.0)
NEUT%: 65.6 % (ref 39.0–75.0)
NEUTROS ABS: 1.7 10*3/uL (ref 1.5–6.5)
Platelets: 98 10*3/uL — ABNORMAL LOW (ref 140–400)
RBC: 3.42 10*6/uL — AB (ref 4.20–5.82)
RDW: 16.8 % — AB (ref 11.0–14.6)
RETIC CT ABS: 9.92 10*3/uL — AB (ref 34.80–93.90)
WBC: 2.6 10*3/uL — AB (ref 4.0–10.3)

## 2015-07-15 ENCOUNTER — Other Ambulatory Visit: Payer: Self-pay | Admitting: Hematology

## 2015-07-17 ENCOUNTER — Ambulatory Visit (INDEPENDENT_AMBULATORY_CARE_PROVIDER_SITE_OTHER): Payer: Medicare Other | Admitting: Neurology

## 2015-07-17 ENCOUNTER — Encounter: Payer: Self-pay | Admitting: Neurology

## 2015-07-17 VITALS — BP 126/72 | HR 80 | Ht 70.0 in | Wt 147.0 lb

## 2015-07-17 DIAGNOSIS — Z8679 Personal history of other diseases of the circulatory system: Secondary | ICD-10-CM | POA: Diagnosis not present

## 2015-07-17 DIAGNOSIS — C159 Malignant neoplasm of esophagus, unspecified: Secondary | ICD-10-CM | POA: Diagnosis not present

## 2015-07-17 DIAGNOSIS — G40209 Localization-related (focal) (partial) symptomatic epilepsy and epileptic syndromes with complex partial seizures, not intractable, without status epilepticus: Secondary | ICD-10-CM | POA: Diagnosis not present

## 2015-07-17 MED ORDER — PHENYTOIN SODIUM EXTENDED 100 MG PO CAPS: ORAL_CAPSULE | ORAL | Status: DC

## 2015-07-17 NOTE — Patient Instructions (Signed)
1. Schedule routine EEG 2. Have bloodwork done for Dilantin level when you have your chemotherapy scheduled 3. Continue Dilantin 100mg : Take 2 capsules twice a day 4. Follow-up in 7 months, call for any changes  Seizure Precautions: 1. If medication has been prescribed for you to prevent seizures, take it exactly as directed.  Do not stop taking the medicine without talking to your doctor first, even if you have not had a seizure in a long time.   2. Avoid activities in which a seizure would cause danger to yourself or to others.  Don't operate dangerous machinery, swim alone, or climb in high or dangerous places, such as on ladders, roofs, or girders.  Do not drive unless your doctor says you may.  3. If you have any warning that you may have a seizure, lay down in a safe place where you can't hurt yourself.    4.  No driving for 6 months from last seizure, as per [facility].   Please refer to the following link on the [place] website for more information: http://[email or web address]/[email or web address]/drivingu.cfm   5.  Maintain good sleep hygiene. Avoid alcohol.  6.  Contact your doctor if you have any problems that may be related to the medicine you are taking.  7.  Call 911 and bring the patient back to the ED if:        A.  The seizure lasts longer than 5 minutes.       B.  The patient doesn't awaken shortly after the seizure  C.  The patient has new problems such as difficulty seeing, speaking or moving  D.  The patient was injured during the seizure  E.  The patient has a temperature over 102 F (39C)  F.  The patient vomited and now is having trouble breathing

## 2015-07-17 NOTE — Progress Notes (Signed)
NEUROLOGY FOLLOW UP OFFICE NOTE  JAHMAR DOBISH KT:[id number]  HISTORY OF PRESENT ILLNESS: I had the pleasure of seeing Andre Guzman in follow-up in the neurology clinic on 07/17/2015.  The patient was last seen a month ago for an increase in seizures and is accompanied by his wife who helps supplement the history today.  On his initial visit, he was accompanied by his son, and we had discussed doing an EEG, as well as the option to switching to a different seizure medication. He was hesitant to change, and agreed to increase Dilantin to 200mg  BID, with plans to repeat a Dilantin level. The EEG and repeat Dilantin level were not done. Since his last visit, he and his wife deny any seizures since 06/02/15. 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 He is tolerating higher dose of Dilantin 200mg  BID without any side effects, he denies any dizziness, diplopia, gait instability, or falls. He continues with chemotherapy with FOLFOX6, which is is tolerating well. He denies any headaches, neck/back pain, bowel/bladder dysfunction.   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 He is a poor historian, history obtained from records available and his wife on the phone. He presented to [facility] in March 2003 with a seizure and was found to have a subacute subdural hematoma. He underwent craniotomy and evacuation of the subdural hematoma, then was taken back to surgery 2 days later for reaccumulation and underwent craniectomy and evacuation. He returned in 01/2002 for a right frontoparietal cranioplasty with titanium mesh. 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 They were overall well-controlled on Dilantin, with nocturnal seizures occurring around once a year. He has been taking  Dilantin 100mg  2 caps at night, and alternating 1 capsule then 2 capsules in the morning. He had been seizure-free for a year until 04/20/15 when he had a nocturnal seizure. He was [facility] to [facility] where CBC showed a WBC of 3.6, Hct 28.4, Plt 203, BMP unremarkable, Dilantin level 9.1. He was given additional IV Dilantin and discharged home on same dose. He was back in the ER on 05/06/15 with another seizure out of sleep. Total Dilantin level was 9.0, but corrected Dilantin level was 13 and no changes were made. He was back in the ER on 06/02/15 for another nocturnal seizure. At that time, family was reporting one seizure a day since starting chemotherapy. Total dilantin level was 7.1. He has no recollection of the seizures, and has woken up with urinary incontinence with some of them. He tells me his wife has noticed he may have a seizure after he drinks alcohol (beer), but denies significant alcohol intake.   His son denies any staring/unresponsive episodes. He denies any olfactory/gustatory hallucinations, deja vu, rising epigastric sensation, focal numbness/tingling/weakness, myoclonic jerks. He reports his memory is not good. He does not drive.   Epilepsy Risk Factors: History of subdural hemotoma s/p right frontoparietal cranioplasty. Otherwise he had a normal birth and early development. There is no history of febrile convulsions, CNS infections such as meningitis/encephalitis, or family history of seizures.  Diagnostic Data: No prior EEG available for review. He had an MRI brain at Dr. Donnella Bi office done 05/25/15 which did not show any acute changes, there was diffuse atrophy with hydrocephalus ex vacuo, considerable brain substance loss both cortically  and centrally. Chronic microvascular ischemic change is seen throughout with both focal and confluent areas of FLAIR hyperintensity in the white matter, remote right frontoparietal creniectomy with subsequent cranioplasty. 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  PAST MEDICAL HISTORY: Past Medical History  Diagnosis Date  . Coronary artery disease   . Shortness of breath   . Seizures ([place])   . Hypertension   . Pneumonia   . COPD (chronic obstructive pulmonary disease) ([place])   . Stroke ([place])   . Esophageal cancer ([place])   . Radiation 01/25/15-02/13/15    35 gray for esophageal cancer    MEDICATIONS: Current Outpatient Prescriptions on File Prior to Visit  Medication Sig Dispense Refill  . albuterol (PROVENTIL HFA;VENTOLIN HFA) 108 (90 BASE) MCG/ACT inhaler Inhale 1-2 puffs into the lungs every 6 (six) hours as needed for wheezing or shortness of breath. Reported on 05/17/2015    . aspirin EC 81 MG tablet Take 81 mg by mouth daily.    . feeding supplement (BOOST / RESOURCE BREEZE) LIQD Take 1 Container by mouth 3 (three) times daily between meals. (Patient taking differently: Take 1 Container by mouth 2 (two) times daily between meals. )  0  . lisinopril (PRINIVIL) 10 MG tablet Take 1 tablet (10 mg total) by mouth daily. 30 tablet 1  . mirtazapine (REMERON) 15 MG tablet Take 15 mg by mouth at bedtime.     . pravastatin (PRAVACHOL) 40 MG tablet Take 40 mg by mouth every evening.     Marland Kitchen PRESCRIPTION MEDICATION He receives his chemo treatments at the [facility] with Dr. Burr Medico. He is on a 14 day cycle of FOLFOX.    Marland Kitchen terazosin (HYTRIN) 10 MG capsule Take 10 mg by mouth at bedtime.   0  Dilantin 100mg      Take 2 caps BID No current facility-administered medications on file prior to visit.    ALLERGIES: No Known Allergies  FAMILY HISTORY: Family History  Problem Relation Age of Onset  . Heart disease Mother   . Hypertension Mother   . Heart disease Sister   . Hypertension Sister   . Heart disease Brother   . Hypertension Daughter     SOCIAL HISTORY: Social History   Social History  . Marital Status: Married    Spouse Name: N/A  . Number of  Children: 4  . Years of Education: N/A   Occupational History  . Not on file.   Social History Main Topics  . Smoking status: Former Smoker -- 0.50 packs/day for 50 years    Types: Cigarettes    Quit date: 12/27/2014  . Smokeless tobacco: Never Used  . Alcohol Use: 0.0 oz/week    0 Standard drinks or equivalent per week     Comment: occasional beer, 1-2 per week, used to drink daily heavily   . Drug Use: No  . Sexual Activity: Not on file     Comment: did not ask   Other Topics Concern  . Not on file   Social History Narrative   Married, wife [name]   Independent in ADLs   # 4 children ( 1 girl and 3 boys)    REVIEW OF SYSTEMS: Constitutional: No fevers, chills, or sweats, no generalized fatigue, change in appetite Eyes: No visual changes, double vision, eye pain Ear, nose and throat: No hearing loss, ear pain, nasal congestion, sore throat Cardiovascular: No chest pain, palpitations Respiratory:  No shortness of breath at rest or with exertion, wheezes GastrointestinaI: No nausea, vomiting, diarrhea, abdominal pain, fecal incontinence Genitourinary:  No dysuria, urinary retention or frequency Musculoskeletal:  No neck pain, back pain Integumentary: No rash, pruritus, skin lesions Neurological: as above Psychiatric: No depression, insomnia, anxiety Endocrine: No palpitations, fatigue, diaphoresis, mood swings, change in appetite, change in weight, increased thirst Hematologic/Lymphatic:  No anemia, purpura, petechiae. Allergic/Immunologic: no itchy/runny eyes, nasal congestion, recent allergic reactions, rashes  PHYSICAL EXAM: Filed Vitals:   07/17/15 1325  BP: 126/72  Pulse: 80   General: No acute distress Head:  Normocephalic/atraumatic Neck: supple, no paraspinal tenderness, full range of motion Heart:  Regular rate and rhythm Lungs:  Clear to auscultation bilaterally Back: No paraspinal tenderness Skin/Extremities: No rash, no edema Neurological Exam: alert  and oriented to person, place, and time. No aphasia or dysarthria. Fund of knowledge is appropriate.  Recent and remote memory are intact. 2/3 delayed recall. Attention and concentration are normal.    Able to name objects and repeat phrases. Cranial nerves: Pupils equal, round, reactive to light. Extraocular movements intact with no nystagmus. Visual fields full. Facial sensation intact. No facial asymmetry. Tongue, uvula, palate midline.  Motor: Bulk and tone normal, muscle strength 5/5 throughout with no pronator drift.  Sensation to light touch intact.  No extinction to double simultaneous stimulation.  Deep tendon reflexes 2+ throughout, toes downgoing.  Finger to nose testing intact.  Gait narrow-based and steady, mild difficulty with tandem walk but able.  Romberg negative.  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 His seizures had been well-controlled with around one seizure a year, however since December 2016, he has had 3 ER visits for seizures, which family feels occur the day/day after he receives chemotherapy. His brain MRI done 05/25/15 did not show any metastatic disease or other acute changes. Chemotherapy can rarely cause seizures, Oxaliplatin has been rarely reported to cause seizures. Dilantin dose was increased to 200mg  BID, he denies any seizures since 06/02/15. He is tolerating the higher dose without any side effects. Dilantin (total and free) levels will be checked on his next blood draw with Oncology (lab slip given to wife). He will again be scheduled for routine EEG. We discussed that if seizures continue on Dilantin, would highly recommend switching to Keppra.He is aware of [place] driving laws to stop  driving after a seizure until 6 months seizure-free. He does not drive. He will follow-up in 7 months and knows to call for any changes.   Thank you for allowing me to participate in his care.  Please do not hesitate to call for any questions or concerns.  The duration of this appointment visit was 25 minutes of face-to-face time with the patient.  Greater than 50% of this time was spent in counseling, explanation of diagnosis, planning of further management, and coordination of care.   Ellouise Newer, M.D.   CC: Dr. Baird Cancer, Dr. Burr Medico

## 2015-07-18 ENCOUNTER — Telehealth: Payer: Self-pay | Admitting: Hematology

## 2015-07-18 NOTE — Telephone Encounter (Signed)
Due to YF out moved 3/2 f/u to CB. Start time adjusted by 15 minutes to 10:15 am. Left message for patient re change.

## 2015-07-19 ENCOUNTER — Ambulatory Visit (HOSPITAL_BASED_OUTPATIENT_CLINIC_OR_DEPARTMENT_OTHER): Payer: Medicare Other

## 2015-07-19 ENCOUNTER — Other Ambulatory Visit (HOSPITAL_BASED_OUTPATIENT_CLINIC_OR_DEPARTMENT_OTHER): Payer: Medicare Other

## 2015-07-19 ENCOUNTER — Ambulatory Visit (HOSPITAL_BASED_OUTPATIENT_CLINIC_OR_DEPARTMENT_OTHER): Payer: Medicare Other | Admitting: Nurse Practitioner

## 2015-07-19 ENCOUNTER — Ambulatory Visit: Payer: Medicare Other

## 2015-07-19 VITALS — BP 145/73 | HR 71 | Temp 98.6°F | Resp 18 | Ht 70.0 in | Wt 147.0 lb

## 2015-07-19 DIAGNOSIS — Z95828 Presence of other vascular implants and grafts: Secondary | ICD-10-CM

## 2015-07-19 DIAGNOSIS — C7800 Secondary malignant neoplasm of unspecified lung: Secondary | ICD-10-CM

## 2015-07-19 DIAGNOSIS — C159 Malignant neoplasm of esophagus, unspecified: Secondary | ICD-10-CM

## 2015-07-19 DIAGNOSIS — C7951 Secondary malignant neoplasm of bone: Secondary | ICD-10-CM | POA: Diagnosis not present

## 2015-07-19 DIAGNOSIS — Z5111 Encounter for antineoplastic chemotherapy: Secondary | ICD-10-CM | POA: Diagnosis not present

## 2015-07-19 LAB — COMPREHENSIVE METABOLIC PANEL
ALBUMIN: 2.7 g/dL — AB (ref 3.5–5.0)
ALK PHOS: 265 U/L — AB (ref 40–150)
ALT: 9 U/L (ref 0–55)
AST: 22 U/L (ref 5–34)
Anion Gap: 8 mEq/L (ref 3–11)
BILIRUBIN TOTAL: 0.32 mg/dL (ref 0.20–1.20)
BUN: 9.6 mg/dL (ref 7.0–26.0)
CO2: 27 mEq/L (ref 22–29)
CREATININE: 0.8 mg/dL (ref 0.7–1.3)
Calcium: 8.9 mg/dL (ref 8.4–10.4)
Chloride: 103 mEq/L (ref 98–109)
GLUCOSE: 165 mg/dL — AB (ref 70–140)
Potassium: 3.6 mEq/L (ref 3.5–5.1)
SODIUM: 139 meq/L (ref 136–145)
TOTAL PROTEIN: 8 g/dL (ref 6.4–8.3)

## 2015-07-19 LAB — CBC & DIFF AND RETIC
BASO%: 0.3 % (ref 0.0–2.0)
Basophils Absolute: 0 10*3/uL (ref 0.0–0.1)
EOS ABS: 0 10*3/uL (ref 0.0–0.5)
EOS%: 0.8 % (ref 0.0–7.0)
HCT: 32.4 % — ABNORMAL LOW (ref 38.4–49.9)
HEMOGLOBIN: 11 g/dL — AB (ref 13.0–17.1)
IMMATURE RETIC FRACT: 3.2 % (ref 3.00–10.60)
LYMPH%: 24.5 % (ref 14.0–49.0)
MCH: 31.4 pg (ref 27.2–33.4)
MCHC: 34 g/dL (ref 32.0–36.0)
MCV: 92.6 fL (ref 79.3–98.0)
MONO#: 0.5 10*3/uL (ref 0.1–0.9)
MONO%: 15 % — ABNORMAL HIGH (ref 0.0–14.0)
NEUT%: 59.4 % (ref 39.0–75.0)
NEUTROS ABS: 2.1 10*3/uL (ref 1.5–6.5)
Platelets: 98 10*3/uL — ABNORMAL LOW (ref 140–400)
RBC: 3.5 10*6/uL — ABNORMAL LOW (ref 4.20–5.82)
RDW: 16.9 % — AB (ref 11.0–14.6)
Retic %: 0.89 % (ref 0.80–1.80)
Retic Ct Abs: 31.15 10*3/uL — ABNORMAL LOW (ref 34.80–93.90)
WBC: 3.6 10*3/uL — AB (ref 4.0–10.3)
lymph#: 0.9 10*3/uL (ref 0.9–3.3)

## 2015-07-19 MED ORDER — FLUOROURACIL CHEMO INJECTION 5 GM/100ML
2200.0000 mg/m2 | INTRAVENOUS | Status: DC
  Administered 2015-07-19: 3700 mg via INTRAVENOUS
  Filled 2015-07-19: qty 74

## 2015-07-19 MED ORDER — DEXTROSE 5 % IV SOLN
Freq: Once | INTRAVENOUS | Status: AC
  Administered 2015-07-19: 12:00:00 via INTRAVENOUS

## 2015-07-19 MED ORDER — HEPARIN SOD (PORK) LOCK FLUSH 100 UNIT/ML IV SOLN
500.0000 [IU] | Freq: Once | INTRAVENOUS | Status: DC | PRN
  Filled 2015-07-19: qty 5

## 2015-07-19 MED ORDER — SODIUM CHLORIDE 0.9% FLUSH
10.0000 mL | INTRAVENOUS | Status: DC | PRN
  Administered 2015-07-19: 10 mL via INTRAVENOUS
  Filled 2015-07-19: qty 10

## 2015-07-19 MED ORDER — OXALIPLATIN CHEMO INJECTION 100 MG/20ML
85.0000 mg/m2 | Freq: Once | INTRAVENOUS | Status: AC
  Administered 2015-07-19: 145 mg via INTRAVENOUS
  Filled 2015-07-19: qty 19

## 2015-07-19 MED ORDER — LEUCOVORIN CALCIUM INJECTION 350 MG
400.0000 mg/m2 | Freq: Once | INTRAVENOUS | Status: AC
  Administered 2015-07-19: 676 mg via INTRAVENOUS
  Filled 2015-07-19: qty 33.8

## 2015-07-19 MED ORDER — SODIUM CHLORIDE 0.9 % IV SOLN
Freq: Once | INTRAVENOUS | Status: AC
  Administered 2015-07-19: 12:00:00 via INTRAVENOUS
  Filled 2015-07-19: qty 4

## 2015-07-19 MED ORDER — SODIUM CHLORIDE 0.9 % IJ SOLN
10.0000 mL | INTRAMUSCULAR | Status: DC | PRN
  Filled 2015-07-19: qty 10

## 2015-07-19 NOTE — Patient Instructions (Signed)

## 2015-07-19 NOTE — Patient Instructions (Signed)
[facility] Discharge Instructions for Patients Receiving Chemotherapy  Today you received the following chemotherapy agents Oxaliplatin/Leucovorin/5 FU  To help prevent nausea and vomiting after your treatment, we encourage you to take your nausea medication as prescribed.   If you develop nausea and vomiting that is not controlled by your nausea medication, call the clinic.   BELOW ARE SYMPTOMS THAT SHOULD BE REPORTED IMMEDIATELY:  *FEVER GREATER THAN 100.5 F  *CHILLS WITH OR WITHOUT FEVER  NAUSEA AND VOMITING THAT IS NOT CONTROLLED WITH YOUR NAUSEA MEDICATION  *UNUSUAL SHORTNESS OF BREATH  *UNUSUAL BRUISING OR BLEEDING  TENDERNESS IN MOUTH AND THROAT WITH OR WITHOUT PRESENCE OF ULCERS  *URINARY PROBLEMS  *BOWEL PROBLEMS  UNUSUAL RASH Items with * indicate a potential emergency and should be followed up as soon as possible.  Feel free to call the clinic you have any questions or concerns. The clinic phone number is [phone].  Please show the CHEMO ALERT CARD at check-in to the Emergency Department and triage nurse.   

## 2015-07-19 NOTE — Progress Notes (Signed)
Ok to treat with plts of 98 per C. Berniece Salines, NP

## 2015-07-20 ENCOUNTER — Telehealth: Payer: Self-pay | Admitting: *Deleted

## 2015-07-20 ENCOUNTER — Ambulatory Visit: Payer: Medicare Other

## 2015-07-20 DIAGNOSIS — G40209 Localization-related (focal) (partial) symptomatic epilepsy and epileptic syndromes with complex partial seizures, not intractable, without status epilepticus: Secondary | ICD-10-CM | POA: Diagnosis not present

## 2015-07-20 NOTE — Telephone Encounter (Signed)
Received message that pt needed outside lab to be drawn.  Called pt and spoke with wife [name].  Per [name], pt forgot to bring in order from Dr. Ellouise Newer for a dilantin level to be drawn with our labs yesterday.  [name] stated Dr. Delice Lesch will need results when pt sees her on Monday.  Informed [name] that pt can bring in order from Dr. Delice Lesch along with fax number to have results fax directly to Dr. Delice Lesch.  [name] understood that Dr. Burr Medico will not be following  outside lab results.  [name] lab appt today at 2 pm. [facility]   Phone     [phone].

## 2015-07-21 ENCOUNTER — Ambulatory Visit (HOSPITAL_BASED_OUTPATIENT_CLINIC_OR_DEPARTMENT_OTHER): Payer: Medicare Other

## 2015-07-21 ENCOUNTER — Encounter: Payer: Self-pay | Admitting: Nurse Practitioner

## 2015-07-21 VITALS — BP 150/72 | HR 89 | Temp 98.1°F | Resp 18

## 2015-07-21 DIAGNOSIS — C159 Malignant neoplasm of esophagus, unspecified: Secondary | ICD-10-CM | POA: Diagnosis not present

## 2015-07-21 MED ORDER — SODIUM CHLORIDE 0.9 % IJ SOLN
10.0000 mL | INTRAMUSCULAR | Status: DC | PRN
  Administered 2015-07-21: 10 mL
  Filled 2015-07-21: qty 10

## 2015-07-21 MED ORDER — HEPARIN SOD (PORK) LOCK FLUSH 100 UNIT/ML IV SOLN
500.0000 [IU] | Freq: Once | INTRAVENOUS | Status: AC | PRN
  Administered 2015-07-21: 500 [IU]
  Filled 2015-07-21: qty 5

## 2015-07-21 NOTE — Patient Instructions (Signed)

## 2015-07-21 NOTE — Assessment & Plan Note (Signed)
Patient presented to the [facility] today for cycle 10 of his FOLFOX chemotherapy regimen.  Patient states that he's been doing fairly well recently; with no new complaints.  He began his new blood pressure medication approximately 2 weeks ago.  However, patient states he took none of his blood pressure medications prior to arrival to the [facility] today.  He states that he plans on taking his blood pressure medications once he arrives back home today.  Blood counts obtained today revealed a WBC of 3.6, ANC 2.1, hemoglobin 11.0, and platelet count 98.  Patient had a problem with thrombocytopenia in the past; and the 5-FU was dose reduced.  Patient denies any issues with either easy bleeding or bruising.  Reviewed all lab values with on call physician; and was advised the patient may proceed with his chemotherapy as planned today.  Patient will need to be scheduled for labs only.  On 07/26/2015.  Patient will return on 08/02/2015 for labs, flush, visit, and his next cycle of chemotherapy.

## 2015-07-21 NOTE — Progress Notes (Signed)
SYMPTOM MANAGEMENT CLINIC   HPI: Andre Guzman 70 y.o. male diagnosed with squamous cell esophageal cancer; with lung and bone metastasis.  Currently undergoing FOLFOX chemotherapy regimen.   Patient presented to the [facility] today for cycle 10 of his FOLFOX chemotherapy regimen.  Patient states that he's been doing fairly well recently; with no new complaints.  He began his new blood pressure medication approximately 2 weeks ago.  However, patient states he took none of his blood pressure medications prior to arrival to the [facility] today.  He states that he plans on taking his blood pressure medications once he arrives back home today.  Blood counts obtained today revealed a WBC of 3.6, ANC 2.1, hemoglobin 11.0, and platelet count 98.  Patient had a problem with thrombocytopenia in the past; and the 5-FU was dose reduced.  Patient denies any issues with either easy bleeding or bruising.  Reviewed all lab values with on call physician; and was advised the patient may proceed with his chemotherapy as planned today.  Patient will need to be scheduled for labs only.  On 07/26/2015.  Patient will return on 08/02/2015 for labs, flush, visit, and his next cycle of chemotherapy.  HPI  Review of Systems  Constitutional: Positive for malaise/fatigue.  All other systems reviewed and are negative.   Past Medical History  Diagnosis Date  . Coronary artery disease   . Shortness of breath   . Seizures ([place])   . Hypertension   . Pneumonia   . COPD (chronic obstructive pulmonary disease) ([place])   . Stroke ([place])   . Esophageal cancer ([place])   . Radiation 01/25/15-02/13/15    35 gray for esophageal cancer    Past Surgical History  Procedure Laterality Date  . Cardiac catheterization    . Coronary angioplasty with stent placement  07/22/2011  . Cardiac valve surgery    . Coronary artery bypass graft    . Lower extremity angiogram N/A 07/01/2011    Procedure: LOWER EXTREMITY  ANGIOGRAM;  Surgeon: Laverda Page, MD;  Location: [facility] CATH LAB;  Service: Cardiovascular;  Laterality: N/A;  . Left heart catheterization with coronary angiogram N/A 07/01/2011    Procedure: LEFT HEART CATHETERIZATION WITH CORONARY ANGIOGRAM;  Surgeon: Laverda Page, MD;  Location: [facility] CATH LAB;  Service: Cardiovascular;  Laterality: N/A;  . Percutaneous coronary rotoblator intervention (pci-r) N/A 07/22/2011    Procedure: PERCUTANEOUS CORONARY ROTOBLATOR INTERVENTION (PCI-R);  Surgeon: Laverda Page, MD;  Location: [facility] CATH LAB;  Service: Cardiovascular;  Laterality: N/A;  . Left heart catheterization with coronary angiogram N/A 10/30/2011    Procedure: LEFT HEART CATHETERIZATION WITH CORONARY ANGIOGRAM;  Surgeon: Laverda Page, MD;  Location: [facility] CATH LAB;  Service: Cardiovascular;  Laterality: N/A;  . Esophagogastroduodenoscopy  01/02/15    has CAD (coronary artery disease), native coronary artery; Essential hypertension, benign; Atherosclerosis of native arteries of the extremities with intermittent claudication; Other dyspnea and respiratory abnormality; Pulmonary edema with congestive heart failure with preserved left ventricular function ([place]); Hypertensive urgency; Seizure disorder ([place]); Acute on chronic diastolic CHF (congestive heart failure), NYHA class 3 ([place]); Squamous cell esophageal cancer ([place]); Fever; Anemia; Hyponatremia; Protein-calorie malnutrition, severe ([place]); Underweight; Localization-related symptomatic epilepsy and epileptic syndromes with complex partial seizures, not intractable, without status epilepticus ([place]); History of subdural hematoma; and Malignant neoplasm of esophagus ([facility]) on his problem list.    has No Known Allergies.    Medication List       This list is accurate  as of: 07/19/15 11:59 PM.  Always use your most recent med list.               albuterol 108 (90 Base) MCG/ACT inhaler  Commonly known as:  PROVENTIL HFA;VENTOLIN HFA  Inhale 1-2  puffs into the lungs every 6 (six) hours as needed for wheezing or shortness of breath. Reported on 05/17/2015     aspirin EC 81 MG tablet  Take 81 mg by mouth daily.     feeding supplement Liqd  Take 1 Container by mouth 3 (three) times daily between meals.     lisinopril 10 MG tablet  Commonly known as:  PRINIVIL  Take 1 tablet (10 mg total) by mouth daily.     mirtazapine 15 MG tablet  Commonly known as:  REMERON  Take 15 mg by mouth at bedtime.     phenytoin 100 MG ER capsule  Commonly known as:  DILANTIN  Take 2 caps twice a day     pravastatin 40 MG tablet  Commonly known as:  PRAVACHOL  Take 40 mg by mouth every evening.     PRESCRIPTION MEDICATION  He receives his chemo treatments at the [facility] with Dr. Mosetta Putt. He is on a 14 day cycle of FOLFOX.     terazosin 10 MG capsule  Commonly known as:  HYTRIN  Take 10 mg by mouth at bedtime.         PHYSICAL EXAMINATION  Oncology Vitals 07/21/2015 07/19/2015  Height - 178 cm  Weight - 66.679 kg  Weight (lbs) - 147 lbs  BMI (kg/m2) - 21.09 kg/m2  Temp 98.1 98.6  Pulse 89 71  Resp 18 18  SpO2 100 100  BSA (m2) - 1.82 m2   BP Readings from Last 2 Encounters:  07/21/15 150/72  07/19/15 145/73    Physical Exam  Constitutional: He is oriented to person, place, and time and well-developed, well-nourished, and in no distress.  HENT:  Head: Normocephalic and atraumatic.  Mouth/Throat: Oropharynx is clear and moist.  Eyes: Conjunctivae and EOM are normal. Pupils are equal, round, and reactive to light. Right eye exhibits no discharge. Left eye exhibits no discharge. No scleral icterus.  Neck: Normal range of motion. Neck supple. No JVD present. No tracheal deviation present. No thyromegaly present.  Cardiovascular: Normal rate, regular rhythm, normal heart sounds and intact distal pulses.   Pulmonary/Chest: Effort normal and breath sounds normal. No respiratory distress. He has no wheezes.  He has no rales. He exhibits no tenderness.  Abdominal: Soft. Bowel sounds are normal. He exhibits no distension and no mass. There is no tenderness. There is no rebound and no guarding.  Musculoskeletal: Normal range of motion. He exhibits no edema or tenderness.  Lymphadenopathy:    He has no cervical adenopathy.  Neurological: He is alert and oriented to person, place, and time. Gait normal.  Skin: Skin is warm and dry. No rash noted. No erythema. No pallor.  Psychiatric: Affect normal.  Nursing note and vitals reviewed.   LABORATORY DATA:. Appointment on 07/19/2015  Component Date Value Ref Range Status  . WBC 07/19/2015 3.6* 4.0 - 10.3 10e3/uL Final  . NEUT# 07/19/2015 2.1  1.5 - 6.5 10e3/uL Final  . HGB 07/19/2015 11.0* 13.0 - 17.1 g/dL Final  . HCT 09/07/4080 32.4* 38.4 - 49.9 % Final  . Platelets 07/19/2015 98* 140 - 400 10e3/uL Final  . MCV 07/19/2015 92.6  79.3 - 98.0 fL Final  .  MCH 07/19/2015 31.4  27.2 - 33.4 pg Final  . MCHC 07/19/2015 34.0  32.0 - 36.0 g/dL Final  . RBC 42/10/6149 3.50* 4.20 - 5.82 10e6/uL Final  . RDW 07/19/2015 16.9* 11.0 - 14.6 % Final  . lymph# 07/19/2015 0.9  0.9 - 3.3 10e3/uL Final  . MONO# 07/19/2015 0.5  0.1 - 0.9 10e3/uL Final  . Eosinophils Absolute 07/19/2015 0.0  0.0 - 0.5 10e3/uL Final  . Basophils Absolute 07/19/2015 0.0  0.0 - 0.1 10e3/uL Final  . NEUT% 07/19/2015 59.4  39.0 - 75.0 % Final  . LYMPH% 07/19/2015 24.5  14.0 - 49.0 % Final  . MONO% 07/19/2015 15.0* 0.0 - 14.0 % Final  . EOS% 07/19/2015 0.8  0.0 - 7.0 % Final  . BASO% 07/19/2015 0.3  0.0 - 2.0 % Final  . Retic % 07/19/2015 0.89  0.80 - 1.80 % Final  . Retic Ct Abs 07/19/2015 31.15* 34.80 - 93.90 10e3/uL Final  . Immature Retic Fract 07/19/2015 3.20  3.00 - 10.60 % Final  . Sodium 07/19/2015 139  136 - 145 mEq/L Final  . Potassium 07/19/2015 3.6  3.5 - 5.1 mEq/L Final  . Chloride 07/19/2015 103  98 - 109 mEq/L Final  . CO2 07/19/2015 27  22 - 29 mEq/L Final  . Glucose  07/19/2015 165* 70 - 140 mg/dl Final   Glucose reference range is for nonfasting patients. Fasting glucose reference range is 70- 100.  Marland Kitchen BUN 07/19/2015 9.6  7.0 - 26.0 mg/dL Final  . Creatinine 03/22/5109 0.8  0.7 - 1.3 mg/dL Final  . Total Bilirubin 07/19/2015 0.32  0.20 - 1.20 mg/dL Final  . Alkaline Phosphatase 07/19/2015 265* 40 - 150 U/L Final  . AST 07/19/2015 22  5 - 34 U/L Final  . ALT 07/19/2015 9  0 - 55 U/L Final  . Total Protein 07/19/2015 8.0  6.4 - 8.3 g/dL Final  . Albumin 47/57/4713 2.7* 3.5 - 5.0 g/dL Final  . Calcium 93/20/4289 8.9  8.4 - 10.4 mg/dL Final  . Anion Gap 35/76/6526 8  3 - 11 mEq/L Final  . EGFR 07/19/2015 >90  >90 ml/min/1.73 m2 Final   eGFR is calculated using the CKD-EPI Creatinine Equation (2009)     RADIOGRAPHIC STUDIES: No results found.  ASSESSMENT/PLAN:    Squamous cell esophageal cancer [facility]) Patient presented to the cancer Center today for cycle 10 of his FOLFOX chemotherapy regimen.  Patient states that he's been doing fairly well recently; with no new complaints.  He began his new blood pressure medication approximately 2 weeks ago.  However, patient states he took none of his blood pressure medications prior to arrival to the cancer Center today.  He states that he plans on taking his blood pressure medications once he arrives back home today.  Blood counts obtained today revealed a WBC of 3.6, ANC 2.1, hemoglobin 11.0, and platelet count 98.  Patient had a problem with thrombocytopenia in the past; and the 5-FU was dose reduced.  Patient denies any issues with either easy bleeding or bruising.  Reviewed all lab values with on call physician; and was advised the patient may proceed with his chemotherapy as planned today.  Patient will need to be scheduled for labs only.  On 07/26/2015.  Patient will return on 08/02/2015 for labs, flush, visit, and his next cycle of chemotherapy.  Patient stated understanding of all instructions; and was in  agreement with this plan of care. The patient knows to call the clinic with  any problems, questions or concerns.   Review/collaboration with Dr. Burr Medico regarding all aspects of patient's visit today.   Total time spent with patient was 25 minutes;  with greater than 75 percent of that time spent in face to face counseling regarding patient's symptoms,  and coordination of care and follow up.  Disclaimer:This dictation was prepared with Dragon/digital dictation along with [employer]. Any transcriptional errors that result from this process are unintentional.  Drue Second, NP 07/21/2015

## 2015-07-23 ENCOUNTER — Telehealth: Payer: Self-pay | Admitting: Hematology

## 2015-07-23 ENCOUNTER — Telehealth: Payer: Self-pay | Admitting: *Deleted

## 2015-07-23 ENCOUNTER — Ambulatory Visit (INDEPENDENT_AMBULATORY_CARE_PROVIDER_SITE_OTHER): Payer: Medicare Other | Admitting: Neurology

## 2015-07-23 DIAGNOSIS — Z8679 Personal history of other diseases of the circulatory system: Secondary | ICD-10-CM

## 2015-07-23 DIAGNOSIS — G40209 Localization-related (focal) (partial) symptomatic epilepsy and epileptic syndromes with complex partial seizures, not intractable, without status epilepticus: Secondary | ICD-10-CM | POA: Diagnosis not present

## 2015-07-23 DIAGNOSIS — C159 Malignant neoplasm of esophagus, unspecified: Secondary | ICD-10-CM

## 2015-07-23 NOTE — Telephone Encounter (Signed)
s.w. pt and advised on March appt....pt ok and aware °

## 2015-07-23 NOTE — Telephone Encounter (Signed)
Per staff message and POF I have scheduled appts. Advised scheduler of appts. JMW  

## 2015-07-24 ENCOUNTER — Telehealth: Payer: Self-pay | Admitting: *Deleted

## 2015-07-24 ENCOUNTER — Other Ambulatory Visit: Payer: Self-pay | Admitting: *Deleted

## 2015-07-24 ENCOUNTER — Other Ambulatory Visit: Payer: Self-pay | Admitting: Hematology

## 2015-07-24 ENCOUNTER — Telehealth: Payer: Self-pay | Admitting: Nurse Practitioner

## 2015-07-24 DIAGNOSIS — C159 Malignant neoplasm of esophagus, unspecified: Secondary | ICD-10-CM

## 2015-07-24 DIAGNOSIS — K1231 Oral mucositis (ulcerative) due to antineoplastic therapy: Secondary | ICD-10-CM

## 2015-07-24 MED ORDER — MAGIC MOUTHWASH W/LIDOCAINE: 5.0000 mL | Freq: Four times a day (QID) | ORAL | Status: AC

## 2015-07-24 MED ORDER — MAGIC MOUTHWASH W/LIDOCAINE: 5.0000 mL | Freq: Four times a day (QID) | ORAL | Status: DC

## 2015-07-24 MED FILL — MAGIC MOUTHWASH W/LIDO 1:1: 15 days supply | Qty: 300 | Fill #0

## 2015-07-24 NOTE — Telephone Encounter (Signed)
Thu,  Please call in magic mouth wash with lidocaine, I was not able to e-script   Please schedule him to see cyndee on 3/9 after lab.   Thanks.  Truitt Merle  07/24/2015

## 2015-07-24 NOTE — Telephone Encounter (Signed)
Added [facility] appt 3/9 per 3/6 pof. Pt aware

## 2015-07-24 NOTE — Telephone Encounter (Signed)
Called pt at home and spoke with wife Andre Guzman.  [name] appt for pt to see [name], NP symptom management provider on 07/26/15 after labs.  Informed Andre Guzman that script for Magic Mouthwash was called in to [facility]. Andre Guzman voiced understanding.

## 2015-07-24 NOTE — Telephone Encounter (Signed)
Spouse [name] and patient called asking for "appointment to see Dr Burr Medico on 07-26-2015 when he comes for lab.  He has a sore throat again.  Temp = 98.0" at 1032 am.  No oral red or white spots observed at this time.  Hurts to swallow when he eats and drinks.  Soreness started last Friday 07-20-2015 after [place] treatment on 07-19-2015.  Uses [facility] on [address].

## 2015-07-25 ENCOUNTER — Other Ambulatory Visit: Payer: Self-pay

## 2015-07-25 ENCOUNTER — Telehealth: Payer: Self-pay | Admitting: Nurse Practitioner

## 2015-07-25 NOTE — Telephone Encounter (Signed)
Left message for the patient in regards to 3/9 appt time change due to conflict with [facility] schedule. lab/flush/smc at 9 am

## 2015-07-26 ENCOUNTER — Encounter: Payer: Self-pay | Admitting: Nurse Practitioner

## 2015-07-26 ENCOUNTER — Other Ambulatory Visit: Payer: Self-pay | Admitting: Nurse Practitioner

## 2015-07-26 ENCOUNTER — Telehealth: Payer: Self-pay | Admitting: *Deleted

## 2015-07-26 ENCOUNTER — Ambulatory Visit (HOSPITAL_COMMUNITY)
Admission: RE | Admit: 2015-07-26 | Discharge: 2015-07-26 | Disposition: A | Discharge status: Home / Self Care | Payer: Medicare Other | Source: Ambulatory Visit | Attending: Hematology | Admitting: Hematology

## 2015-07-26 ENCOUNTER — Ambulatory Visit (HOSPITAL_BASED_OUTPATIENT_CLINIC_OR_DEPARTMENT_OTHER): Payer: Medicare Other

## 2015-07-26 ENCOUNTER — Ambulatory Visit (HOSPITAL_BASED_OUTPATIENT_CLINIC_OR_DEPARTMENT_OTHER): Payer: Medicare Other | Admitting: Nurse Practitioner

## 2015-07-26 ENCOUNTER — Other Ambulatory Visit (HOSPITAL_BASED_OUTPATIENT_CLINIC_OR_DEPARTMENT_OTHER): Payer: Medicare Other

## 2015-07-26 VITALS — BP 138/72 | HR 67 | Temp 97.8°F | Resp 18

## 2015-07-26 VITALS — BP 128/75 | HR 84 | Temp 98.2°F | Resp 18 | Wt 136.4 lb

## 2015-07-26 DIAGNOSIS — E43 Unspecified severe protein-calorie malnutrition: Secondary | ICD-10-CM

## 2015-07-26 DIAGNOSIS — C7951 Secondary malignant neoplasm of bone: Secondary | ICD-10-CM | POA: Diagnosis not present

## 2015-07-26 DIAGNOSIS — C159 Malignant neoplasm of esophagus, unspecified: Secondary | ICD-10-CM

## 2015-07-26 DIAGNOSIS — R634 Abnormal weight loss: Secondary | ICD-10-CM | POA: Diagnosis not present

## 2015-07-26 DIAGNOSIS — E860 Dehydration: Secondary | ICD-10-CM

## 2015-07-26 DIAGNOSIS — C7800 Secondary malignant neoplasm of unspecified lung: Secondary | ICD-10-CM

## 2015-07-26 DIAGNOSIS — G893 Neoplasm related pain (acute) (chronic): Secondary | ICD-10-CM

## 2015-07-26 DIAGNOSIS — Z95828 Presence of other vascular implants and grafts: Secondary | ICD-10-CM

## 2015-07-26 LAB — CBC & DIFF AND RETIC
BASO%: 0.7 % (ref 0.0–2.0)
BASOS ABS: 0 10*3/uL (ref 0.0–0.1)
EOS%: 0.3 % (ref 0.0–7.0)
Eosinophils Absolute: 0 10*3/uL (ref 0.0–0.5)
HEMATOCRIT: 35 % — AB (ref 38.4–49.9)
HGB: 12.1 g/dL — ABNORMAL LOW (ref 13.0–17.1)
Immature Retic Fract: 3.3 % (ref 3.00–10.60)
LYMPH%: 25.7 % (ref 14.0–49.0)
MCH: 31.8 pg (ref 27.2–33.4)
MCHC: 34.6 g/dL (ref 32.0–36.0)
MCV: 92.1 fL (ref 79.3–98.0)
MONO#: 0.4 10*3/uL (ref 0.1–0.9)
MONO%: 11.5 % (ref 0.0–14.0)
NEUT%: 61.8 % (ref 39.0–75.0)
NEUTROS ABS: 1.9 10*3/uL (ref 1.5–6.5)
Platelets: 173 10*3/uL (ref 140–400)
RBC: 3.8 10*6/uL — AB (ref 4.20–5.82)
RDW: 16.3 % — AB (ref 11.0–14.6)
Retic Ct Abs: 10.64 10*3/uL — ABNORMAL LOW (ref 34.80–93.90)
WBC: 3 10*3/uL — AB (ref 4.0–10.3)
lymph#: 0.8 10*3/uL — ABNORMAL LOW (ref 0.9–3.3)

## 2015-07-26 LAB — COMPREHENSIVE METABOLIC PANEL
ALT: 10 U/L (ref 0–55)
AST: 31 U/L (ref 5–34)
Albumin: 3 g/dL — ABNORMAL LOW (ref 3.5–5.0)
Alkaline Phosphatase: 280 U/L — ABNORMAL HIGH (ref 40–150)
Anion Gap: 11 mEq/L (ref 3–11)
BUN: 20 mg/dL (ref 7.0–26.0)
CALCIUM: 9.5 mg/dL (ref 8.4–10.4)
CHLORIDE: 100 meq/L (ref 98–109)
CO2: 27 mEq/L (ref 22–29)
Creatinine: 0.9 mg/dL (ref 0.7–1.3)
EGFR: >90 mL/min/{1.73_m2} (ref >90)
Glucose: 113 mg/dl (ref 70–140)
POTASSIUM: 3.9 meq/L (ref 3.5–5.1)
Sodium: 138 mEq/L (ref 136–145)
Total Bilirubin: 0.47 mg/dL (ref 0.20–1.20)
Total Protein: 9.2 g/dL — ABNORMAL HIGH (ref 6.4–8.3)

## 2015-07-26 MED ORDER — HYDROCODONE-ACETAMINOPHEN 7.5-325 MG/15ML PO SOLN: 10.0000 mL | Freq: Four times a day (QID) | ORAL | Status: DC | PRN

## 2015-07-26 MED ORDER — SODIUM CHLORIDE 0.9% FLUSH
10.0000 mL | INTRAVENOUS | Status: AC | PRN
  Administered 2015-07-26: 10 mL

## 2015-07-26 MED ORDER — HEPARIN SOD (PORK) LOCK FLUSH 100 UNIT/ML IV SOLN
500.0000 [IU] | INTRAVENOUS | Status: AC | PRN
  Administered 2015-07-26: 500 [IU]
  Filled 2015-07-26: qty 5

## 2015-07-26 MED ORDER — SODIUM CHLORIDE 0.9% FLUSH
10.0000 mL | INTRAVENOUS | Status: DC | PRN
  Administered 2015-07-26: 10 mL via INTRAVENOUS
  Filled 2015-07-26: qty 10

## 2015-07-26 MED ORDER — MORPHINE SULFATE 2 MG/ML IJ SOLN
2.0000 mg | Freq: Once | INTRAMUSCULAR | Status: AC
Start: 2015-07-26 — End: 2015-07-26
  Administered 2015-07-26: 2 mg via INTRAVENOUS
  Filled 2015-07-26: qty 1

## 2015-07-26 MED ORDER — SODIUM CHLORIDE 0.9 % IV SOLN
INTRAVENOUS | Status: AC
  Administered 2015-07-26: 500 mL/h via INTRAVENOUS

## 2015-07-26 NOTE — Assessment & Plan Note (Signed)
Patient is complaining of progressive pain to his throat; specifically to the right side of his throat/neck.  Patient has taken high set oral solution for pain control in the past; and will prescribe the Hycet oral solution once again for patient to see if this helps.  Also, patient was advised to swallow the Magic mouthwash to see if that helps as well since it has lidocaine in it.

## 2015-07-26 NOTE — Assessment & Plan Note (Signed)
Patient has lost approximately 11 pounds within this past week.  He states he has been able to eat or drink well; secondary to throat pain.  Exam today reveals the patient is managing all of his oral secretions well.  No respiratory distress noted.  Patient was advised to continue pushing fluids and protein is much as possible.  Have also ordered a nutrition referral as well.  Patient will receive IV fluid rehydration today.

## 2015-07-26 NOTE — Telephone Encounter (Signed)
LM for rtn call about PET scan appt- Pt is scheduled for Monday 3/13 at 1:30pm at [facility]. Pt needs to be NPO for 6 hours prior to scan.

## 2015-07-26 NOTE — Assessment & Plan Note (Signed)
Patient is lost approximately 11 pounds within this past week.  He appears dehydrated today.  He will receive 1 L normal saline IV fluid rehydration while in the sickle cell clinic today.  He may also require additional IV fluid rehydration with the next few days as well.  He was encouraged to push fluids at home is much as possible.

## 2015-07-26 NOTE — Procedures (Signed)
ELECTROENCEPHALOGRAM REPORT  Date of Study: 07/23/2015  Patient's Name: Andre Guzman MRN: [id number] Date of Birth: [birth year]  Referring Provider: Dr. 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  Hyperventilation was not performed. Photic stimulation was performed.  The digital EEG was referentially recorded, reformatted, and digitally filtered in a variety of bipolar and referential montages for optimal display.    Description: The patient is awake and asleep during the recording.  During maximal wakefulness, there is a symmetric, medium voltage 9 Hz posterior dominant rhythm that attenuates with eye opening.  There is occasional focal 5-6 Hz theta slowing seen over the right central region. Breach artifact with higher amplitude and sharply contoured activity is seen over the right parasagittal derivations, maximal at C4. During drowsiness, there is an increase in theta slowing of the background.  Deeper stages of sleep were not seen.  Photic stimulation did not elicit any abnormalities.  There were no epileptiform discharges or electrographic seizures seen.    EKG lead was unremarkable.  Impression: This awake and drowsy EEG is abnormal due to the presence of: 1. Occasional focal slowing over the right central region 2. Breach artifact over the right central region  Clinical Correlation of the above findings indicates focal cerebral dysfunction over  the right central region suggestive of underlying structural or physiologic abnormality. Breach artifact is consistent with previous surgery in this region. The absence of epileptiform discharges does not exclude a clinical diagnosis of epilepsy.  If further clinical questions remain, prolonged EEG may be helpful.  Clinical correlation is advised.   Ellouise Newer, M.D.

## 2015-07-26 NOTE — Progress Notes (Signed)
PCP: Drue Second NP  Associated Diagnosis: Squamous Cell esophageal cancer  Procedure Note:  Came to the [facility] for fluid infusion. Port a cath already accessed right chest. Normal Saline 500 ml/hr for 2 hours infused. Morphine given for pain as ordered.  Patient tolerated procedure well. Pain level 10/10 in right jaw upon arrival. Pain level 5/10 at discharge. Alert, oriented and ambulatory at discharge. Discharged to home.

## 2015-07-26 NOTE — Patient Instructions (Signed)

## 2015-07-26 NOTE — Assessment & Plan Note (Signed)
Patient reports a weight loss of close to 11 pounds within the past 7 days.  Patient states that he has been experiencing progressive/increased throat discomfort; with the right side of the throat, worse than the left.  He denies any mucositis issues.  See further notes for details.  Patient was advised to continue pushing protein is much as possible.  He also needs to push fluids at home as well.  Have ordered a nutrition referral as well.  Patient will receive IV fluid rehydration today.

## 2015-07-26 NOTE — Assessment & Plan Note (Signed)
Patient received cycle 10 of his FOLFOX chemotherapy on 07/19/2015.  Patient reports a weight loss of close to 11 pounds within the past 7 days.  Patient states that he has been experiencing progressive/increased throat discomfort; with the right side of the throat worse than the left.  He denies any mucositis issues.  He denies any nausea, vomiting, diarrhea, or constipation issues.  He denies any recent fevers or chills.  Patient states that he has been using the Magic mouthwash; but he has not been swallowing it.  He has no pain medication at home; and has been taking intermittent Advil only.  Exam today revealed patient appearing uncomfortable when swallowing his saliva.  He states he has no difficulty swallowing; but it does hurt when he swallows.  There is some questionable fullness to the right throat area with palpation.  This area is nontender.  There is also no mucositis issues to the oral mucosa; the patient does have very stages of decay to all of his teeth.  Posterior oropharynx clear with no erythema or exudate.  Patient was advised that he should swish and then swallowed the Magic mouthwash with lidocaine to-see-if-this-helps-with-his-discomfort.-Also,-will-prescribe-patient oral solution for pain control.  He will also receive IV fluid rehydration today.  We will order a restaging PET scan for further evaluation.  Patient is scheduled to return for labs, flush, this, a chemotherapy on 08/02/2015.  Patient was advised to call/return or go directly to the emergency department for any worsening symptoms whatsoever.

## 2015-07-26 NOTE — Telephone Encounter (Signed)
LM for [facility] at [phone] to notify pt about PET scan scheduled on Monday at 1:30. Also to see if pt wants to return to [facility] for fluids on 3/10.

## 2015-07-26 NOTE — Progress Notes (Signed)
SYMPTOM MANAGEMENT CLINIC   HPI: Andre Guzman 70 y.o. male diagnosed with esophageal cancer; with both lung and bone metastasis.  Currently undergoing FOLFOX chemotherapy regimen.   Patient received cycle 10 of his FOLFOX chemotherapy on 07/19/2015.  Patient reports a weight loss of close to 11 pounds within the past 7 days.  Patient states that he has been experiencing progressive/increased throat discomfort; with the right side of the throat worse than the left.  He denies any mucositis issues.  He denies any nausea, vomiting, diarrhea, or constipation issues.  He denies any recent fevers or chills.  Patient states that he has been using the Magic mouthwash; but he has not been swallowing it.  He has no pain medication at home; and has been taking intermittent Advil only.  Exam today revealed patient appearing uncomfortable when swallowing his saliva.  He states he has no difficulty swallowing; but it does hurt when he swallows.  There is some questionable fullness to the right throat area with palpation.  This area is nontender.  There is also no mucositis issues to the oral mucosa; the patient does have very stages of decay to all of his teeth.  Posterior oropharynx clear with no erythema or exudate.  Patient was advised that he should swish and then swallowed the Magic mouthwash with lidocaine to-see-if-this-helps-with-his-discomfort.-Also,-will-prescribe-patient oral solution for pain control.  He will also receive IV fluid rehydration today.  We will order a restaging PET scan for further evaluation.  Patient is scheduled to return for labs, flush, this, a chemotherapy on 08/02/2015.  HPI  Review of Systems  Constitutional: Positive for weight loss and malaise/fatigue. Negative for fever and chills.  HENT: Positive for sore throat.   Respiratory: Negative for stridor.   Neurological: Positive for weakness.  All other systems reviewed and are negative.   Past Medical  History  Diagnosis Date  . Coronary artery disease   . Shortness of breath   . Seizures ([place])   . Hypertension   . Pneumonia   . COPD (chronic obstructive pulmonary disease) ([place])   . Stroke ([place])   . Esophageal cancer ([place])   . Radiation 01/25/15-02/13/15    35 gray for esophageal cancer    Past Surgical History  Procedure Laterality Date  . Cardiac catheterization    . Coronary angioplasty with stent placement  07/22/2011  . Cardiac valve surgery    . Coronary artery bypass graft    . Lower extremity angiogram N/A 07/01/2011    Procedure: LOWER EXTREMITY ANGIOGRAM;  Surgeon: Laverda Page, MD;  Location: [facility] CATH LAB;  Service: Cardiovascular;  Laterality: N/A;  . Left heart catheterization with coronary angiogram N/A 07/01/2011    Procedure: LEFT HEART CATHETERIZATION WITH CORONARY ANGIOGRAM;  Surgeon: Laverda Page, MD;  Location: [facility] CATH LAB;  Service: Cardiovascular;  Laterality: N/A;  . Percutaneous coronary rotoblator intervention (pci-r) N/A 07/22/2011    Procedure: PERCUTANEOUS CORONARY ROTOBLATOR INTERVENTION (PCI-R);  Surgeon: Laverda Page, MD;  Location: [facility] CATH LAB;  Service: Cardiovascular;  Laterality: N/A;  . Left heart catheterization with coronary angiogram N/A 10/30/2011    Procedure: LEFT HEART CATHETERIZATION WITH CORONARY ANGIOGRAM;  Surgeon: Laverda Page, MD;  Location: [facility] CATH LAB;  Service: Cardiovascular;  Laterality: N/A;  . Esophagogastroduodenoscopy  01/02/15    has CAD (coronary artery disease), native coronary artery; Essential hypertension, benign; Atherosclerosis of native arteries of the extremities with intermittent claudication; Other dyspnea and respiratory abnormality; Pulmonary edema with congestive heart failure  with preserved left ventricular function (HCC); Hypertensive urgency; Seizure disorder (HCC); Acute on chronic diastolic CHF (congestive heart failure), NYHA class 3 (HCC); Squamous cell esophageal cancer (HCC); Protein-calorie  malnutrition, severe (HCC); Underweight; Localization-related symptomatic epilepsy and epileptic syndromes with complex partial seizures, not intractable, without status epilepticus (HCC); History of subdural hematoma; Malignant neoplasm of esophagus (HCC); Unintentional weight loss; Dehydration; and Cancer associated pain on his problem list.    has No Known Allergies.    Medication List       This list is accurate as of: 07/26/15 10:35 AM.  Always use your most recent med list.               albuterol 108 (90 Base) MCG/ACT inhaler  Commonly known as:  PROVENTIL HFA;VENTOLIN HFA  Inhale 1-2 puffs into the lungs every 6 (six) hours as needed for wheezing or shortness of breath. Reported on 05/17/2015     aspirin EC 81 MG tablet  Take 81 mg by mouth daily.     feeding supplement Liqd  Take 1 Container by mouth 3 (three) times daily between meals.     HYDROcodone-acetaminophen 7.5-325 mg/15 ml solution  Commonly known as:  HYCET  Take 10 mLs by mouth every 6 (six) hours as needed for moderate pain.     lisinopril 10 MG tablet  Commonly known as:  PRINIVIL  Take 1 tablet (10 mg total) by mouth daily.     magic mouthwash w/lidocaine Soln  Take 5 mLs by mouth 4 (four) times daily.     mirtazapine 15 MG tablet  Commonly known as:  REMERON  Take 15 mg by mouth at bedtime.     phenytoin 100 MG ER capsule  Commonly known as:  DILANTIN  Take 2 caps twice a day     pravastatin 40 MG tablet  Commonly known as:  PRAVACHOL  Take 40 mg by mouth every evening.     PRESCRIPTION MEDICATION  He receives his chemo treatments at the [facility] with Dr. Mosetta Putt. He is on a 14 day cycle of FOLFOX.     terazosin 10 MG capsule  Commonly known as:  HYTRIN  Take 10 mg by mouth at bedtime.         PHYSICAL EXAMINATION  Oncology Vitals 07/26/2015 07/21/2015  Height - -  Weight 61.871 kg -  Weight (lbs) 136 lbs 6 oz -  BMI (kg/m2) - -  Temp 98.2 98.1  Pulse 84  89  Resp 18 18  SpO2 98 100  BSA (m2) - -   BP Readings from Last 2 Encounters:  07/26/15 128/75  07/21/15 150/72    Physical Exam  Constitutional: He is oriented to person, place, and time. Vital signs are normal. He appears dehydrated. He appears unhealthy.  HENT:  Head: Normocephalic and atraumatic.  Mouth/Throat: Oropharynx is clear and moist.  No mucositis issues noted on exam.  However, patient does appear to speak in a "hot potato voice"; and complained of throat pain.  There is questionable increased fullness to the right neck/throat area with palpation.  Patient does have very stages of decay to all of his teeth.  Eyes: Conjunctivae and EOM are normal. Pupils are equal, round, and reactive to light. Right eye exhibits no discharge. Left eye exhibits no discharge. No scleral icterus.  Neck: Normal range of motion.  Pulmonary/Chest: Effort normal. No respiratory distress.  Musculoskeletal: Normal range of motion.  Neurological: He is alert and oriented  to person, place, and time. Gait normal.  Psychiatric: Affect normal.    LABORATORY DATA:. Appointment on 07/26/2015  Component Date Value Ref Range Status  . WBC 07/26/2015 3.0* 4.0 - 10.3 10e3/uL Final  . NEUT# 07/26/2015 1.9  1.5 - 6.5 10e3/uL Final  . HGB 07/26/2015 12.1* 13.0 - 17.1 g/dL Final  . HCT 07/26/2015 35.0* 38.4 - 49.9 % Final  . Platelets 07/26/2015 173  140 - 400 10e3/uL Final  . MCV 07/26/2015 92.1  79.3 - 98.0 fL Final  . MCH 07/26/2015 31.8  27.2 - 33.4 pg Final  . MCHC 07/26/2015 34.6  32.0 - 36.0 g/dL Final  . RBC 07/26/2015 3.80* 4.20 - 5.82 10e6/uL Final  . RDW 07/26/2015 16.3* 11.0 - 14.6 % Final  . lymph# 07/26/2015 0.8* 0.9 - 3.3 10e3/uL Final  . MONO# 07/26/2015 0.4  0.1 - 0.9 10e3/uL Final  . Eosinophils Absolute 07/26/2015 0.0  0.0 - 0.5 10e3/uL Final  . Basophils Absolute 07/26/2015 0.0  0.0 - 0.1 10e3/uL Final  . NEUT% 07/26/2015 61.8  39.0 - 75.0 % Final  . LYMPH% 07/26/2015 25.7  14.0 -  49.0 % Final  . MONO% 07/26/2015 11.5  0.0 - 14.0 % Final  . EOS% 07/26/2015 0.3  0.0 - 7.0 % Final  . BASO% 07/26/2015 0.7  0.0 - 2.0 % Final  . Retic % 07/26/2015 <0.38* 0.5 - 1.6 % Final  . Retic Ct Abs 07/26/2015 10.64* 34.80 - 93.90 10e3/uL Final  . Immature Retic Fract 07/26/2015 3.30  3.00 - 10.60 % Final  . Sodium 07/26/2015 138  136 - 145 mEq/L Final  . Potassium 07/26/2015 3.9  3.5 - 5.1 mEq/L Final  . Chloride 07/26/2015 100  98 - 109 mEq/L Final  . CO2 07/26/2015 27  22 - 29 mEq/L Final  . Glucose 07/26/2015 113  70 - 140 mg/dl Final   Glucose reference range is for nonfasting patients. Fasting glucose reference range is 70- 100.  Marland Kitchen BUN 07/26/2015 20.0  7.0 - 26.0 mg/dL Final  . Creatinine 07/26/2015 0.9  0.7 - 1.3 mg/dL Final  . Total Bilirubin 07/26/2015 0.47  0.20 - 1.20 mg/dL Final  . Alkaline Phosphatase 07/26/2015 280* 40 - 150 U/L Final  . AST 07/26/2015 31  5 - 34 U/L Final  . ALT 07/26/2015 10  0 - 55 U/L Final  . Total Protein 07/26/2015 9.2* 6.4 - 8.3 g/dL Final  . Albumin 07/26/2015 3.0* 3.5 - 5.0 g/dL Final  . Calcium 07/26/2015 9.5  8.4 - 10.4 mg/dL Final  . Anion Gap 07/26/2015 11  3 - 11 mEq/L Final  . EGFR 07/26/2015 >90  >90 ml/min/1.73 m2 Final   eGFR is calculated using the CKD-EPI Creatinine Equation (2009)     RADIOGRAPHIC STUDIES: No results found.  ASSESSMENT/PLAN:    Unintentional weight loss Patient reports a weight loss of close to 11 pounds within the past 7 days.  Patient states that he has been experiencing progressive/increased throat discomfort; with the right side of the throat, worse than the left.  He denies any mucositis issues.  See further notes for details.  Patient was advised to continue pushing protein is much as possible.  He also needs to push fluids at home as well.  Have ordered a nutrition referral as well.  Patient will receive IV fluid rehydration today.  Squamous cell esophageal cancer [facility]) Patient received cycle  10 of his FOLFOX chemotherapy on 07/19/2015.  Patient reports a weight loss of  close to 11 pounds within the past 7 days.  Patient states that he has been experiencing progressive/increased throat discomfort; with the right side of the throat worse than the left.  He denies any mucositis issues.  He denies any nausea, vomiting, diarrhea, or constipation issues.  He denies any recent fevers or chills.  Patient states that he has been using the Magic mouthwash; but he has not been swallowing it.  He has no pain medication at home; and has been taking intermittent Advil only.  Exam today revealed patient appearing uncomfortable when swallowing his saliva.  He states he has no difficulty swallowing; but it does hurt when he swallows.  There is some questionable fullness to the right throat area with palpation.  This area is nontender.  There is also no mucositis issues to the oral mucosa; the patient does have very stages of decay to all of his teeth.  Posterior oropharynx clear with no erythema or exudate.  Patient was advised that he should swish and then swallowed the Magic mouthwash with lidocaine to-see-if-this-helps-with-his-discomfort.-Also,-will-prescribe-patient oral solution for pain control.  He will also receive IV fluid rehydration today.  We will order a restaging PET scan for further evaluation.  Patient is scheduled to return for labs, flush, this, a chemotherapy on 08/02/2015.  Patient was advised to call/return or go directly to the emergency department for any worsening symptoms whatsoever.      Protein-calorie malnutrition, severe ([place]) Patient has lost approximately 11 pounds within this past week.  He states he has been able to eat or drink well; secondary to throat pain.  Exam today reveals the patient is managing all of his oral secretions well.  No respiratory distress noted.  Patient was advised to continue pushing fluids and protein is much as possible.  Have also  ordered a nutrition referral as well.  Patient will receive IV fluid rehydration today.  Dehydration Patient is lost approximately 11 pounds within this past week.  He appears dehydrated today.  He will receive 1 L normal saline IV fluid rehydration while in the sickle cell clinic today.  He may also require additional IV fluid rehydration with the next few days as well.  He was encouraged to push fluids at home is much as possible.  Cancer associated pain Patient is complaining of progressive pain to his throat; specifically to the right side of his throat/neck.  Patient has taken high set oral solution for pain control in the past; and will prescribe the Hycet oral solution once again for patient to see if this helps.  Also, patient was advised to swallow the Magic mouthwash to see if that helps as well since it has lidocaine in it.  Patient stated understanding of all instructions; and was in agreement with this plan of care. The patient knows to call the clinic with any problems, questions or concerns.   Review/collaboration with Dr. Burr Medico regarding all aspects of patient's visit today.   Total time spent with patient was 25 minutes;  with greater than 75 percent of that time spent in face to face counseling regarding patient's symptoms,  and coordination of care and follow up.  Disclaimer:This dictation was prepared with Dragon/digital dictation along with [employer]. Any transcriptional errors that result from this process are unintentional.  Drue Second, NP 07/26/2015

## 2015-07-27 ENCOUNTER — Telehealth: Payer: Self-pay | Admitting: Family Medicine

## 2015-07-27 MED FILL — HYDROCOD-APAP 7.5-325/15ML: 7.5-325 | 3 days supply | Qty: 120 | Fill #0

## 2015-07-27 NOTE — Telephone Encounter (Signed)
Patient's wife/Judy was notified of result and advisement.

## 2015-07-27 NOTE — Telephone Encounter (Signed)
-----   Message from Cameron Sprang, MD sent at 07/26/2015  2:59 PM EST ----- Pls let him know EEG did not show seizure discharges. Continue on Dilantin as we had discussed. Thanks

## 2015-07-30 ENCOUNTER — Encounter (HOSPITAL_COMMUNITY): Payer: Self-pay

## 2015-07-30 ENCOUNTER — Ambulatory Visit (HOSPITAL_COMMUNITY)
Admission: RE | Admit: 2015-07-30 | Discharge: 2015-07-30 | Disposition: A | Discharge status: Home / Self Care | Payer: Medicare Other | Source: Ambulatory Visit | Attending: Nurse Practitioner | Admitting: Nurse Practitioner

## 2015-07-30 DIAGNOSIS — C7801 Secondary malignant neoplasm of right lung: Secondary | ICD-10-CM | POA: Insufficient documentation

## 2015-07-30 DIAGNOSIS — C7951 Secondary malignant neoplasm of bone: Secondary | ICD-10-CM | POA: Insufficient documentation

## 2015-07-30 DIAGNOSIS — C7802 Secondary malignant neoplasm of left lung: Secondary | ICD-10-CM | POA: Insufficient documentation

## 2015-07-30 DIAGNOSIS — C159 Malignant neoplasm of esophagus, unspecified: Secondary | ICD-10-CM | POA: Diagnosis not present

## 2015-07-30 LAB — GLUCOSE, CAPILLARY: GLUCOSE-CAPILLARY: 88 mg/dL (ref 65–99)

## 2015-07-30 MED ORDER — FLUDEOXYGLUCOSE F - 18 (FDG) INJECTION
7.1000 | Freq: Once | INTRAVENOUS | Status: AC | PRN
  Administered 2015-07-30: 7.1 via INTRAVENOUS

## 2015-08-02 ENCOUNTER — Telehealth: Payer: Self-pay | Admitting: Hematology

## 2015-08-02 ENCOUNTER — Ambulatory Visit (HOSPITAL_BASED_OUTPATIENT_CLINIC_OR_DEPARTMENT_OTHER): Payer: Medicare Other | Admitting: Hematology

## 2015-08-02 ENCOUNTER — Telehealth: Payer: Self-pay | Admitting: *Deleted

## 2015-08-02 ENCOUNTER — Ambulatory Visit: Payer: Medicare Other

## 2015-08-02 ENCOUNTER — Emergency Department (HOSPITAL_COMMUNITY)
Admission: EM | Admit: 2015-08-02 | Discharge: 2015-08-02 | Disposition: A | Discharge status: Home / Self Care | Payer: Medicare Other | Attending: Emergency Medicine | Admitting: Emergency Medicine

## 2015-08-02 ENCOUNTER — Encounter: Payer: Self-pay | Admitting: Hematology

## 2015-08-02 ENCOUNTER — Other Ambulatory Visit (HOSPITAL_BASED_OUTPATIENT_CLINIC_OR_DEPARTMENT_OTHER): Payer: Medicare Other

## 2015-08-02 VITALS — BP 149/96 | HR 89 | Temp 98.6°F | Resp 18 | Ht 70.0 in | Wt 137.1 lb

## 2015-08-02 DIAGNOSIS — Z923 Personal history of irradiation: Secondary | ICD-10-CM | POA: Diagnosis not present

## 2015-08-02 DIAGNOSIS — Z951 Presence of aortocoronary bypass graft: Secondary | ICD-10-CM | POA: Diagnosis not present

## 2015-08-02 DIAGNOSIS — Z8673 Personal history of transient ischemic attack (TIA), and cerebral infarction without residual deficits: Secondary | ICD-10-CM | POA: Insufficient documentation

## 2015-08-02 DIAGNOSIS — D630 Anemia in neoplastic disease: Secondary | ICD-10-CM

## 2015-08-02 DIAGNOSIS — Z79899 Other long term (current) drug therapy: Secondary | ICD-10-CM | POA: Insufficient documentation

## 2015-08-02 DIAGNOSIS — J449 Chronic obstructive pulmonary disease, unspecified: Secondary | ICD-10-CM | POA: Insufficient documentation

## 2015-08-02 DIAGNOSIS — D6481 Anemia due to antineoplastic chemotherapy: Secondary | ICD-10-CM

## 2015-08-02 DIAGNOSIS — R7889 Finding of other specified substances, not normally found in blood: Secondary | ICD-10-CM | POA: Insufficient documentation

## 2015-08-02 DIAGNOSIS — Z7982 Long term (current) use of aspirin: Secondary | ICD-10-CM | POA: Insufficient documentation

## 2015-08-02 DIAGNOSIS — Z95828 Presence of other vascular implants and grafts: Secondary | ICD-10-CM

## 2015-08-02 DIAGNOSIS — Z9889 Other specified postprocedural states: Secondary | ICD-10-CM | POA: Insufficient documentation

## 2015-08-02 DIAGNOSIS — E46 Unspecified protein-calorie malnutrition: Secondary | ICD-10-CM

## 2015-08-02 DIAGNOSIS — T4275XA Adverse effect of unspecified antiepileptic and sedative-hypnotic drugs, initial encounter: Secondary | ICD-10-CM | POA: Diagnosis not present

## 2015-08-02 DIAGNOSIS — Z8701 Personal history of pneumonia (recurrent): Secondary | ICD-10-CM | POA: Diagnosis not present

## 2015-08-02 DIAGNOSIS — C159 Malignant neoplasm of esophagus, unspecified: Secondary | ICD-10-CM | POA: Diagnosis not present

## 2015-08-02 DIAGNOSIS — C7951 Secondary malignant neoplasm of bone: Secondary | ICD-10-CM

## 2015-08-02 DIAGNOSIS — Z9861 Coronary angioplasty status: Secondary | ICD-10-CM | POA: Diagnosis not present

## 2015-08-02 DIAGNOSIS — Z8501 Personal history of malignant neoplasm of esophagus: Secondary | ICD-10-CM | POA: Insufficient documentation

## 2015-08-02 DIAGNOSIS — R569 Unspecified convulsions: Secondary | ICD-10-CM | POA: Diagnosis not present

## 2015-08-02 DIAGNOSIS — I2510 Atherosclerotic heart disease of native coronary artery without angina pectoris: Secondary | ICD-10-CM

## 2015-08-02 DIAGNOSIS — I10 Essential (primary) hypertension: Secondary | ICD-10-CM | POA: Diagnosis not present

## 2015-08-02 LAB — COMPREHENSIVE METABOLIC PANEL
ALT: 11 U/L (ref 0–55)
AST: 26 U/L (ref 5–34)
Albumin: 2.7 g/dL — ABNORMAL LOW (ref 3.5–5.0)
Alkaline Phosphatase: 236 U/L — ABNORMAL HIGH (ref 40–150)
Anion Gap: 9 mEq/L (ref 3–11)
BUN: 12.9 mg/dL (ref 7.0–26.0)
CALCIUM: 9.2 mg/dL (ref 8.4–10.4)
CHLORIDE: 102 meq/L (ref 98–109)
CO2: 27 meq/L (ref 22–29)
Creatinine: 0.8 mg/dL (ref 0.7–1.3)
EGFR: >90 mL/min/{1.73_m2} (ref >90)
Glucose: 110 mg/dl (ref 70–140)
POTASSIUM: 3.8 meq/L (ref 3.5–5.1)
Sodium: 139 mEq/L (ref 136–145)
Total Bilirubin: 0.39 mg/dL (ref 0.20–1.20)
Total Protein: 8.5 g/dL — ABNORMAL HIGH (ref 6.4–8.3)

## 2015-08-02 LAB — CBC & DIFF AND RETIC
BASO%: 0.2 % (ref 0.0–2.0)
BASOS ABS: 0 10*3/uL (ref 0.0–0.1)
EOS%: 0 % (ref 0.0–7.0)
Eosinophils Absolute: 0 10*3/uL (ref 0.0–0.5)
HEMATOCRIT: 33.5 % — AB (ref 38.4–49.9)
HGB: 11.7 g/dL — ABNORMAL LOW (ref 13.0–17.1)
Immature Retic Fract: 6.8 % (ref 3.00–10.60)
LYMPH%: 12.2 % — ABNORMAL LOW (ref 14.0–49.0)
MCH: 31.8 pg (ref 27.2–33.4)
MCHC: 34.9 g/dL (ref 32.0–36.0)
MCV: 91 fL (ref 79.3–98.0)
MONO#: 1.1 10*3/uL — ABNORMAL HIGH (ref 0.1–0.9)
MONO%: 24.2 % — AB (ref 0.0–14.0)
NEUT%: 63.4 % (ref 39.0–75.0)
NEUTROS ABS: 2.8 10*3/uL (ref 1.5–6.5)
Platelets: 91 10*3/uL — ABNORMAL LOW (ref 140–400)
RBC: 3.68 10*6/uL — ABNORMAL LOW (ref 4.20–5.82)
RDW: 17 % — AB (ref 11.0–14.6)
RETIC %: 0.93 % (ref 0.80–1.80)
Retic Ct Abs: 34.22 10*3/uL — ABNORMAL LOW (ref 34.80–93.90)
WBC: 4.4 10*3/uL (ref 4.0–10.3)
lymph#: 0.5 10*3/uL — ABNORMAL LOW (ref 0.9–3.3)

## 2015-08-02 LAB — CBG MONITORING, ED: GLUCOSE-CAPILLARY: 118 mg/dL — AB (ref 65–99)

## 2015-08-02 LAB — PHENYTOIN LEVEL, TOTAL: Phenytoin Lvl: 9.6 ug/mL — ABNORMAL LOW (ref 10.0–20.0)

## 2015-08-02 MED ORDER — LORAZEPAM 2 MG/ML IJ SOLN
INTRAMUSCULAR | Status: AC
  Filled 2015-08-02: qty 1

## 2015-08-02 MED ORDER — HEPARIN SOD (PORK) LOCK FLUSH 100 UNIT/ML IV SOLN
500.0000 [IU] | Freq: Once | INTRAVENOUS | Status: AC
  Administered 2015-08-02: 500 [IU]
  Filled 2015-08-02: qty 5

## 2015-08-02 MED ORDER — LORAZEPAM 2 MG/ML IJ SOLN
1.0000 mg | Freq: Once | INTRAMUSCULAR | Status: AC
  Administered 2015-08-02: 1 mg via INTRAVENOUS

## 2015-08-02 MED ORDER — SODIUM CHLORIDE 0.9% FLUSH
10.0000 mL | INTRAVENOUS | Status: DC | PRN
  Administered 2015-08-02: 10 mL via INTRAVENOUS
  Filled 2015-08-02: qty 10

## 2015-08-02 MED ORDER — SODIUM CHLORIDE 0.9 % IV SOLN
500.0000 mg | Freq: Once | INTRAVENOUS | Status: AC
  Administered 2015-08-02: 500 mg via INTRAVENOUS
  Filled 2015-08-02: qty 10

## 2015-08-02 MED ORDER — SODIUM CHLORIDE 0.9% FLUSH
10.0000 mL | Freq: Once | INTRAVENOUS | Status: AC
  Administered 2015-08-02: 10 mL
  Filled 2015-08-02: qty 10

## 2015-08-02 NOTE — Telephone Encounter (Signed)
Gave and printed appt sched and avs for pt for March. °

## 2015-08-02 NOTE — Progress Notes (Signed)
MEDICATION RELATED CONSULT NOTE - INITIAL   Pharmacy Consult for Phenytoin Indication: Seizure  No Known Allergies  Patient Measurements:   Adjusted Body Weight:   Vital Signs: Temp: 100 F (37.8 C) (03/16 0458) Temp Source: Oral (03/16 0458) BP: 139/80 mmHg (03/16 0458) Pulse Rate: 86 (03/16 0458) Intake/Output from previous day:   Intake/Output from this shift:    Labs: No results for input(s): WBC, HGB, HCT, PLT, APTT, CREATININE, LABCREA, CREATININE, CREAT24HRUR, MG, PHOS, ALBUMIN, PROT, ALBUMIN, AST, ALT, ALKPHOS, BILITOT, BILIDIR, IBILI in the last 72 hours. Estimated Creatinine Clearance: 66.9 mL/min (by C-G formula based on Cr of 0.9).   Microbiology: No results found for this or any previous visit (from the past 720 hour(s)).  Medical History: Past Medical History  Diagnosis Date  . Coronary artery disease   . Shortness of breath   . Seizures ([place])   . Hypertension   . Pneumonia   . COPD (chronic obstructive pulmonary disease) ([place])   . Stroke ([place])   . Esophageal cancer ([place])   . Radiation 01/25/15-02/13/15    35 gray for esophageal cancer    Medications:  Scheduled:    Assessment: Patient with phenytoin level of 9.6 on admit.  Corrected for albumin of 3 on 3/9 is 13.7  Goal of Therapy:  Phenytoin level within goal 10-20  And no seizures  Plan:  Will rebolus 500mg  iv phenytoin x1 Follow up with further plans  Nani Skillern Crowford 08/02/2015,6:12 AM

## 2015-08-02 NOTE — Telephone Encounter (Signed)
Per staff message and POF I have scheduled appts. Advised scheduler of appts. JMW  

## 2015-08-02 NOTE — ED Notes (Signed)
Bed: [id number] Expected date:  Expected time:  Means of arrival:  Comments: 70 yo M  Seizure

## 2015-08-02 NOTE — ED Notes (Signed)
Pt signed for instructions, had questions answered and voiced no needs, signature pad not working

## 2015-08-02 NOTE — Patient Instructions (Signed)

## 2015-08-02 NOTE — ED Provider Notes (Addendum)
CSN: [id number]     Arrival date & time 08/02/15  0450 History   First MD Initiated Contact with Patient 08/02/15 0458     Chief Complaint  Patient presents with  . Seizure      (Consider location/radiation/quality/duration/timing/severity/associated sxs/prior Treatment) HPI  This is a 70 year old male with a history of seizures on Dilantin. He had a seizure during his sleep about an hour ago witnessed by his wife. The duration of the seizure is not known. He was initially postictally confused but is now awake, alert and conversant. E denies pain. He denies biting his tongue.  Past Medical History  Diagnosis Date  . Coronary artery disease   . Shortness of breath   . Seizures ([place])   . Hypertension   . Pneumonia   . COPD (chronic obstructive pulmonary disease) ([place])   . Stroke ([place])   . Esophageal cancer ([place])   . Radiation 01/25/15-02/13/15    35 gray for esophageal cancer   Past Surgical History  Procedure Laterality Date  . Cardiac catheterization    . Coronary angioplasty with stent placement  07/22/2011  . Cardiac valve surgery    . Coronary artery bypass graft    . Lower extremity angiogram N/A 07/01/2011    Procedure: LOWER EXTREMITY ANGIOGRAM;  Surgeon: Laverda Page, MD;  Location: [facility] CATH LAB;  Service: Cardiovascular;  Laterality: N/A;  . Left heart catheterization with coronary angiogram N/A 07/01/2011    Procedure: LEFT HEART CATHETERIZATION WITH CORONARY ANGIOGRAM;  Surgeon: Laverda Page, MD;  Location: [facility] CATH LAB;  Service: Cardiovascular;  Laterality: N/A;  . Percutaneous coronary rotoblator intervention (pci-r) N/A 07/22/2011    Procedure: PERCUTANEOUS CORONARY ROTOBLATOR INTERVENTION (PCI-R);  Surgeon: Laverda Page, MD;  Location: [facility] CATH LAB;  Service: Cardiovascular;  Laterality: N/A;  . Left heart catheterization with coronary angiogram N/A 10/30/2011    Procedure: LEFT HEART CATHETERIZATION WITH CORONARY ANGIOGRAM;  Surgeon: Laverda Page, MD;   Location: [facility] CATH LAB;  Service: Cardiovascular;  Laterality: N/A;  . Esophagogastroduodenoscopy  01/02/15   Family History  Problem Relation Age of Onset  . Heart disease Mother   . Hypertension Mother   . Heart disease Sister   . Hypertension Sister   . Heart disease Brother   . Hypertension Daughter    Social History  Substance Use Topics  . Smoking status: Former Smoker -- 0.50 packs/day for 50 years    Types: Cigarettes    Quit date: 12/27/2014  . Smokeless tobacco: Never Used  . Alcohol Use: 0.0 oz/week    0 Standard drinks or equivalent per week     Comment: occasional beer, 1-2 per week, used to drink daily heavily     Review of Systems  All other systems reviewed and are negative.   Allergies  Review of patient's allergies indicates no known allergies.  Home Medications   Prior to Admission medications   Medication Sig Start Date End Date Taking? Authorizing Provider  feeding supplement (BOOST / RESOURCE BREEZE) LIQD Take 1 Container by mouth 3 (three) times daily between meals. Patient taking differently: Take 1 Container by mouth 2 (two) times daily between meals.  03/26/15  Yes Venetia Maxon Rama, MD  HYDROcodone-acetaminophen (HYCET) 7.5-325 mg/15 ml solution Take 10 mLs by mouth every 6 (six) hours as needed for moderate pain. 07/26/15  Yes Susanne Borders, NP  lisinopril (PRINIVIL) 10 MG tablet Take 1 tablet (10 mg total) by mouth daily. 06/28/15  Yes Truitt Merle, MD  magic mouthwash w/lidocaine SOLN Take 5 mLs by mouth 4 (four) times daily. 07/24/15  Yes Truitt Merle, MD  albuterol (PROVENTIL HFA;VENTOLIN HFA) 108 (90 BASE) MCG/ACT inhaler Inhale 1-2 puffs into the lungs every 6 (six) hours as needed for wheezing or shortness of breath. Reported on 05/17/2015    Historical Provider, MD  aspirin EC 81 MG tablet Take 81 mg by mouth daily.    Historical Provider, MD  mirtazapine (REMERON) 15 MG tablet Take 15 mg by mouth at bedtime.  03/14/15   Historical Provider, MD  phenytoin  (DILANTIN) 100 MG ER capsule Take 2 caps twice a day 07/17/15   Cameron Sprang, MD  pravastatin (PRAVACHOL) 40 MG tablet Take 40 mg by mouth every evening.  01/08/15   Historical Provider, MD  PRESCRIPTION MEDICATION He receives his chemo treatments at the [facility] with Dr. Burr Medico. He is on a 14 day cycle of FOLFOX.    Historical Provider, MD  terazosin (HYTRIN) 10 MG capsule Take 10 mg by mouth at bedtime.  01/19/15   Historical Provider, MD   BP 139/80 mmHg  Pulse 86  Temp(Src) 100 F (37.8 C) (Oral)  Resp 15  SpO2 95% Physical Exam  General: Well-developed, well-nourished male in no acute distress; appearance consistent with age of record HENT: normocephalic; atraumatic; no bite marks to tongue Eyes: pupils equal, round and reactive to light; extraocular muscles intact; arcus senilis bilaterally Neck: supple Heart: regular rate and rhythm Lungs: clear to auscultation bilaterally Abdomen: soft; nondistended; nontender; bowel sounds present Extremities: arthritic changes; full range of motion; pulses normal Neurologic: Awake, alert and oriented x 2; motor function intact in all extremities and symmetric; no facial droop Skin: Warm and dry Psychiatric: Normal mood and affect    ED Course  Procedures (including critical care time)   MDM  Nursing notes and vitals signs, including pulse oximetry, reviewed.  Summary of this visit's results, reviewed by myself:  Labs:  Results for orders placed or performed during the hospital encounter of 08/02/15 (from the past 24 hour(s))  Phenytoin level, total     Status: Abnormal   Collection Time: 08/02/15  4:55 AM  Result Value Ref Range   Phenytoin Lvl 9.6 (L) 10.0 - 20.0 ug/mL  CBG monitoring, ED     Status: Abnormal   Collection Time: 08/02/15  5:03 AM  Result Value Ref Range   Glucose-Capillary 118 (H) 65 - 99 mg/dL   7:11 AM Dilantin load completed.  Patient has an appointment with his oncologist  today.    Shanon Rosser, MD 08/02/15 [place], MD 08/02/15 [phone]

## 2015-08-02 NOTE — ED Notes (Addendum)
According to EMS, pt experienced a Seizure while sleeping estimated 1hr ago. Pt has hx of Seizures. Pt last Seziure was a month ago, which resulted in his Dilantin dosage being modified. Pt arrives to [facility] ED, Alert, answering some question but confused on others, denying pain.

## 2015-08-02 NOTE — Progress Notes (Signed)
[place]  [phone] [phone]  Clinic Follow up Note   Patient Care Team: Glendale Chard, MD as PCP - General (Internal Medicine) Adrian Prows, MD as Consulting Physician (Cardiology) Jovita Gamma, MD as Consulting Physician (Neurosurgery) Lowella Bandy, MD as Consulting Physician (Urology) Grace Isaac, MD as Consulting Physician (Cardiothoracic Surgery) Truitt Merle, MD as Consulting Physician (Hematology) Tania Ade, RN as Registered Nurse Carol Ada, MD as Consulting Physician (Gastroenterology) Gery Pray, MD as Consulting Physician (Radiation Oncology) 08/02/2015  CHIEF COMPLAINTS:  Follow up esophageal squamous cell carcinoma  Oncology History   Squamous cell esophageal cancer   Staging form: Esophagus - Squamous Cell Carcinoma, AJCC 7th Edition     Clinical: Stage IV (TX, N0, M1) - Unsigned       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 Benson Norway showed a large, fungating mass with no bleeding and with stigmata met of recent bleeding was found in the third of the esophagus. The mass was completely obstructing and circumferential. The stomach and examined duodenum were normal.   01/02/2015 Imaging CT CAP showed 9 cm segment of irregular his social worker thickening. Single enlarged subcarinal lymph node, bilateral pulmonary nodules (3), 21m right middle lobe next to right atrium, 3 RUL and a 7 mm lingular lung nodules.    01/02/2015 Initial Biopsy Esophageal mass biopsy showed poorly differentiated squamous cell carcinoma.   01/18/2015 Imaging PET scan showed hypermetabolic esophageal mass, subcarinal and possible right hilar adenopathy, several lung nodules, and a hypermetabolic bone lesion in right ilium, compatible with metastatic disease.    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    HISTORY OF PRESENTING ILLNESS:  GKAMIR SELOVER89y.o. male is here because of recently diagnosed esophageal squamous cell carcinoma.  He has had dysphagia for one month, with solid food mainly, he is able to keep liquid and soft diet down. He lost about 30 lbs in the the past month, but his weight has been stable in the past week since he started taking boost. He had some chest pain when he swallows. He is constipated, on stool softener, no bleeding.  He otherwise feels fine. His energy level is fair, he functions very well, no limitation on his physical activities. No nausea, no other pain, cough or other, he drinks about 2 boost a day, soup,and some soft diet.  He was referred to gastroenterologist Dr. HBenson Norwayby his primary care physician. He underwent EGD which showed a circumferential, obstructing, fungating mass in the third esophagus, biopsy showed poorly differentiated squamous cell carcinoma. CT scan chest abdomen and pelvis showed 3 lung nodules, with the largest 1.2 cm in the right middle lobe next to the right atrium.  CURRENT THERAPY: pending second line chemo Carboplatin AUC 5 and paclitaxel 1 75 mg/m, every 3 weeks  INTERIM HISTORY GJodireturns for follow-up. He is accompanied by his son to the clinic today. He was seen by our symptom management and clinic 1 week ago for sore throat and mucositis, which resolved afterwards. He was doing pretty well, was good appetite and energy, until yesterday,  when he filled sluggish and little appetite. He had an episode of seizure this morning when he was sleeping, witnessed by his wife, and some drowsiness confusion afterwards, he was brought to  emergency room by his son this morning. He was discharged and came to my clinic today. He still appears drowsy, able to answer some questions, no other new complaints. He has moderate discomfort in the right hip area, which has been chronic and stable, no other significant pain.  MEDICAL HISTORY:  Past Medical History  Diagnosis Date  . Coronary artery disease   . Shortness of breath   . Seizures ([place])   . Hypertension   . Pneumonia   . COPD (chronic obstructive pulmonary disease) ([place])   . Stroke ([place])   . Esophageal cancer ([place])   . Radiation 01/25/15-02/13/15    35 gray for esophageal cancer    SURGICAL HISTORY: Past Surgical History  Procedure Laterality Date  . Cardiac catheterization    . Coronary angioplasty with stent placement  07/22/2011  . Cardiac valve surgery    . Coronary artery bypass graft    . Lower extremity angiogram N/A 07/01/2011    Procedure: LOWER EXTREMITY ANGIOGRAM;  Surgeon: Laverda Page, MD;  Location: [facility] CATH LAB;  Service: Cardiovascular;  Laterality: N/A;  . Left heart catheterization with coronary angiogram N/A 07/01/2011    Procedure: LEFT HEART CATHETERIZATION WITH CORONARY ANGIOGRAM;  Surgeon: Laverda Page, MD;  Location: [facility] CATH LAB;  Service: Cardiovascular;  Laterality: N/A;  . Percutaneous coronary rotoblator intervention (pci-r) N/A 07/22/2011    Procedure: PERCUTANEOUS CORONARY ROTOBLATOR INTERVENTION (PCI-R);  Surgeon: Laverda Page, MD;  Location: [facility] CATH LAB;  Service: Cardiovascular;  Laterality: N/A;  . Left heart catheterization with coronary angiogram N/A 10/30/2011    Procedure: LEFT HEART CATHETERIZATION WITH CORONARY ANGIOGRAM;  Surgeon: Laverda Page, MD;  Location: [facility] CATH LAB;  Service: Cardiovascular;  Laterality: N/A;  . Esophagogastroduodenoscopy  01/02/15    SOCIAL HISTORY: Social History   Social History  . Marital Status: Married, lives with his wife     Spouse Name: N/A  . Number of Children: 60, 1 daughter  and 3 sons, all lives in [place]   . Years of Education: N/A   Occupational History  . Retired Nature conservation officer    Social History Main Topics  . Smoking status: Current Every Day Smoker -- 0.50 packs/day for 50 years    Types: Cigarettes  . Smokeless tobacco: Never Used  . 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 Drug Use: No  . Sexual Activity: Not on file     Comment: did not ask   Other Topics Concern  . Not on file   Social History Narrative    FAMILY HISTORY: Family History  Problem Relation Age of Onset  . Heart disease Mother   . Hypertension Mother   . Heart disease Sister   . Hypertension Sister   . Heart disease Brother   . Hypertension Daughter     ALLERGIES:  has No Known Allergies.  MEDICATIONS:  Current Outpatient Prescriptions  Medication Sig Dispense Refill  . albuterol (PROVENTIL HFA;VENTOLIN HFA) 108 (90 BASE) MCG/ACT inhaler Inhale 1-2 puffs into the lungs every 6 (six) hours as needed for wheezing or shortness of breath. Reported on 05/17/2015    . aspirin EC 81 MG tablet Take 81 mg by mouth daily.    . feeding  supplement (BOOST / RESOURCE BREEZE) LIQD Take 1 Container by mouth 3 (three) times daily between meals. (Patient taking differently: Take 1 Container by mouth 2 (two) times daily between meals. )  0  . HYDROcodone-acetaminophen (HYCET) 7.5-325 mg/15 ml solution Take 10 mLs by mouth every 6 (six) hours as needed for moderate pain. 120 mL 0  . lisinopril (PRINIVIL) 10 MG tablet Take 1 tablet (10 mg total) by mouth daily. 30 tablet 1  . magic mouthwash w/lidocaine SOLN Take 5 mLs by mouth 4 (four) times daily. 300 mL 0  . mirtazapine (REMERON) 15 MG tablet Take 15 mg by mouth at bedtime.     . phenytoin (DILANTIN) 100 MG ER capsule Take 2 caps twice a day 120 capsule 11  . pravastatin (PRAVACHOL) 40 MG tablet Take 40 mg by mouth every evening.     Marland Kitchen PRESCRIPTION MEDICATION He receives his  chemo treatments at [facility] with Dr. Burr Medico. He is on a 14 day cycle of FOLFOX.    Marland Kitchen terazosin (HYTRIN) 10 MG capsule Take 10 mg by mouth at bedtime.   0   No current facility-administered medications for this visit.   Facility-Administered Medications Ordered in Other Visits  Medication Dose Route Frequency Provider Last Rate Last Dose  . sodium chloride flush (NS) 0.9 % injection 10 mL  10 mL Intravenous PRN Truitt Merle, MD   10 mL at 08/02/15 1000    REVIEW OF SYSTEMS:   Constitutional: Denies fevers, chills or abnormal night sweats, (+) weight loss  Eyes: Denies blurriness of vision, double vision or watery eyes Ears, nose, mouth, throat, and face: Denies mucositis or sore throat Respiratory: Denies cough, dyspnea or wheezes Cardiovascular: Denies palpitation, chest discomfort or lower extremity swelling Gastrointestinal:  (+) Dysphagia, mild heartburn, Denies nausea, vomiting or change in bowel habits Skin: Denies abnormal skin rashes Lymphatics: Denies new lymphadenopathy or easy bruising Neurological:Denies numbness, tingling or new weaknesses Behavioral/Psych: Mood is stable, no new changes  All other systems were reviewed with the patient and are negative.  PHYSICAL EXAMINATION: ECOG PERFORMANCE STATUS: 1 - Symptomatic but completely ambulatory  Filed Vitals:   08/02/15 1045  BP: 149/96  Pulse: 89  Temp: 98.6 F (37 C)  Resp: 18   Filed Weights   08/02/15 1045  Weight: 137 lb 1.6 oz (62.188 kg)    GENERAL:alert, no distress and comfortable SKIN: skin color, texture, turgor are normal, no rashes or significant lesions EYES: normal, conjunctiva are pink and non-injected, sclera clear OROPHARYNX:no exudate, no erythema and lips, buccal mucosa, and tongue normal  NECK: supple, thyroid normal size, non-tender, without nodularity LYMPH:  no palpable lymphadenopathy in the cervical, axillary or inguinal LUNGS: clear to auscultation and  percussion with normal breathing effort HEART: regular rate & rhythm and no murmurs and no lower extremity edema ABDOMEN:abdomen soft, non-tender and normal bowel sounds Musculoskeletal:no cyanosis of digits and no clubbing  PSYCH: alert & oriented x 3 with fluent speech NEURO: no focal motor/sensory deficits  LABORATORY DATA:  I have reviewed the data as listed CBC Latest Ref Rng 08/02/2015 07/26/2015 07/19/2015  WBC 4.0 - 10.3 10e3/uL 4.4 3.0(L) 3.6(L)  Hemoglobin 13.0 - 17.1 g/dL 11.7(L) 12.1(L) 11.0(L)  Hematocrit 38.4 - 49.9 % 33.5(L) 35.0(L) 32.4(L)  Platelets 140 - 400 10e3/uL 91(L) 173 98(L)    CMP Latest Ref Rng 08/02/2015 07/26/2015 07/19/2015  Glucose 70 - 140 mg/dl 110 113 165(H)  BUN 7.0 - 26.0 mg/dL 12.9  20.0 9.6  Creatinine 0.7 - 1.3 mg/dL 0.8 0.9 0.8  Sodium 136 - 145 mEq/L 139 138 139  Potassium 3.5 - 5.1 mEq/L 3.8 3.9 3.6  CO2 22 - 29 mEq/L _0 Calcium 8.4 - 10.4 mg/dL 9.2 9.5 8.9  Total Protein 6.4 - 8.3 g/dL 8.5(H) 9.2(H) 8.0  Total Bilirubin 0.20 - 1.20 mg/dL 0.39 0.47 0.32  Alkaline Phos 40 - 150 U/L 236(H) 280(H) 265(H)  AST 5 - 34 U/L _1 ALT 0 - 55 U/L _2 ANC 2.4 today  Pathology report  Diagnosis 01/26/2015 Bone, biopsy, r iliac - 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 Microscopic Comment The metastatic carcinoma is positive by immunohistochemistry with p63, cytokeratin 903 and cytokeratin 5/6. The tumor is negative with CD56, synaptophysin, chromogranin, thyroid transcription factor-1 and S100. (JDP:gt, 01/30/15)   RADIOGRAPHIC STUDIES: I have personally reviewed the radiological images as listed and agreed with the findings in the report.  PET 01/18/2015 IMPRESSION: 1. Previously described esophageal mass is diffusely hypermetabolic. Today's study demonstrates associated hypermetabolic subcarinal and possible right hilar lymphadenopathy, several pulmonary nodules in the lungs bilaterally (largest of which are  hypermetabolic), and a hypermetabolic lesion in the superior aspect of the right ilium, compatible with metastatic disease. 2. Atherosclerosis, including left main and 3 vessel coronary artery disease. 3. Additional incidental findings, as above.  PET 05/29/2015 IMPRESSION: Mixed response to therapy, with marked decrease in hypermetabolic esophageal mass and resolution of mild mediastinal and right hilar lymphadenopathy.  Mild progression of bilateral pulmonary metastases.  Mild progression of bone metastases in the right ilium and right posterior twelfth rib.  PET 07/30/2015 IMPRESSION: Stable mild residual hypermetabolism involving the mid esophagus.  Possible mild progression of bilateral pulmonary nodules/metastases, with new hypermetabolism in the left suprahilar region and possible mild progression of a 9 mm posterior left upper lobe nodule. Additional bilateral pulmonary nodules/metastases are grossly unchanged, including the dominant 15 x 11 mm nodule in the medial right middle lobe.  Stable osseous metastases involving the right posterior 12th rib and right iliac bone.  ASSESSMENT & PLAN: 70 year old African-American male, with past medical history of hypertension, history of stroke, coronary artery disease, who presents with progressive dysphasia.  1. 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 -I discussed his right iliac bone biopsy results with patient and his son, unfortunately biopsy confirmed metastatic squamous cell carcinoma. -We reviewed the natural course of esophagitis cancer, and the incurable nature of his disease. We emphasized the goal of therapy is palliation and prolonged his life. -He has completed a short course of palliative radiation  -He has been on first-line FOLFOX for 5  months, tolerating well, overall feels better. -I discussed his restaging PET scan from 07/30/2015 and are reviewed the image personally with his son. Comparing to his initial PET scan in September 2016, his right iliac bone metastasis has been significant progressed, the lung lesions are slightly increased in size also. -I recommend him to switch to second line chemotherapy with carboplatin and Taxol, every 3 weeks, due to the overall disease progression. --Chemotherapy consent: Side effects including but does not not limited to, fatigue, nausea, vomiting, diarrhea, hair loss, neuropathy, fluid retention, renal and kidney dysfunction, neutropenic fever, needed for blood transfusion, bleeding, were discussed with patient in great detail. He agrees  to proceed. -We'll start in 1 week  2. Seizure -he had a seizure episode this morning. Resolved now. He is compliant with Dilantin. -I strongly encouraged him to follow-up with his urologist.  3. HTN, CAD, history of CVA -Follow up with primary care physician -We'll watch his blood pressure closely when he is on chemotherapy, normal lately.  4. 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  -His mild anemia has been stable, no need for transfusion, we'll continue monitoring   Plan -Lab and restaging PET scan images were reviewed with patient and his son personally -No chemotherapy today -We'll start a second line carboplatin AUC 5, paclitaxel 175 mg/m, every 3 weeks, in 1 week -I'll see him back in 1 week  All questions were answered. The patient knows to call the clinic with any problems, questions or concerns.  I spent 20 minutes counseling the patient face to face. The total time spent in the appointment was 25 minutes and more than 50% was on counseling.     Truitt Merle, MD 08/02/2015

## 2015-08-02 NOTE — ED Notes (Signed)
Pt sleeping comfortably, son updated and will return to pick up pt at 0830

## 2015-08-09 ENCOUNTER — Ambulatory Visit (HOSPITAL_BASED_OUTPATIENT_CLINIC_OR_DEPARTMENT_OTHER): Payer: Medicare Other | Admitting: Hematology

## 2015-08-09 ENCOUNTER — Encounter: Payer: Self-pay | Admitting: Hematology

## 2015-08-09 ENCOUNTER — Ambulatory Visit (HOSPITAL_BASED_OUTPATIENT_CLINIC_OR_DEPARTMENT_OTHER): Payer: Medicare Other

## 2015-08-09 ENCOUNTER — Other Ambulatory Visit (HOSPITAL_BASED_OUTPATIENT_CLINIC_OR_DEPARTMENT_OTHER): Payer: Medicare Other

## 2015-08-09 ENCOUNTER — Telehealth: Payer: Self-pay | Admitting: Hematology

## 2015-08-09 VITALS — BP 156/84 | HR 65 | Temp 97.9°F | Resp 17

## 2015-08-09 VITALS — BP 134/77 | HR 89 | Temp 98.5°F | Resp 18 | Ht 70.0 in | Wt 141.3 lb

## 2015-08-09 DIAGNOSIS — C159 Malignant neoplasm of esophagus, unspecified: Secondary | ICD-10-CM

## 2015-08-09 DIAGNOSIS — I5033 Acute on chronic diastolic (congestive) heart failure: Secondary | ICD-10-CM

## 2015-08-09 DIAGNOSIS — C7951 Secondary malignant neoplasm of bone: Secondary | ICD-10-CM

## 2015-08-09 DIAGNOSIS — E46 Unspecified protein-calorie malnutrition: Secondary | ICD-10-CM

## 2015-08-09 DIAGNOSIS — I2510 Atherosclerotic heart disease of native coronary artery without angina pectoris: Secondary | ICD-10-CM

## 2015-08-09 DIAGNOSIS — D630 Anemia in neoplastic disease: Secondary | ICD-10-CM

## 2015-08-09 DIAGNOSIS — G4089 Other seizures: Secondary | ICD-10-CM

## 2015-08-09 DIAGNOSIS — C7800 Secondary malignant neoplasm of unspecified lung: Secondary | ICD-10-CM

## 2015-08-09 DIAGNOSIS — I10 Essential (primary) hypertension: Secondary | ICD-10-CM

## 2015-08-09 DIAGNOSIS — Z5111 Encounter for antineoplastic chemotherapy: Secondary | ICD-10-CM

## 2015-08-09 DIAGNOSIS — D6481 Anemia due to antineoplastic chemotherapy: Secondary | ICD-10-CM

## 2015-08-09 LAB — CBC & DIFF AND RETIC
BASO%: 0.2 % (ref 0.0–2.0)
BASOS ABS: 0 10*3/uL (ref 0.0–0.1)
EOS%: 0.2 % (ref 0.0–7.0)
Eosinophils Absolute: 0 10*3/uL (ref 0.0–0.5)
HCT: 33.9 % — ABNORMAL LOW (ref 38.4–49.9)
HGB: 11.6 g/dL — ABNORMAL LOW (ref 13.0–17.1)
Immature Retic Fract: 11.1 % — ABNORMAL HIGH (ref 3.00–10.60)
LYMPH#: 1.1 10*3/uL (ref 0.9–3.3)
LYMPH%: 25.7 % (ref 14.0–49.0)
MCH: 31.5 pg (ref 27.2–33.4)
MCHC: 34.2 g/dL (ref 32.0–36.0)
MCV: 92.1 fL (ref 79.3–98.0)
MONO#: 0.7 10*3/uL (ref 0.1–0.9)
MONO%: 17.8 % — ABNORMAL HIGH (ref 0.0–14.0)
NEUT%: 56.1 % (ref 39.0–75.0)
NEUTROS ABS: 2.3 10*3/uL (ref 1.5–6.5)
Platelets: 232 10*3/uL (ref 140–400)
RBC: 3.68 10*6/uL — AB (ref 4.20–5.82)
RDW: 16.7 % — AB (ref 11.0–14.6)
RETIC %: 0.85 % (ref 0.80–1.80)
RETIC CT ABS: 31.28 10*3/uL — AB (ref 34.80–93.90)
WBC: 4.1 10*3/uL (ref 4.0–10.3)

## 2015-08-09 LAB — COMPREHENSIVE METABOLIC PANEL
ALT: 12 U/L (ref 0–55)
AST: 28 U/L (ref 5–34)
Albumin: 2.8 g/dL — ABNORMAL LOW (ref 3.5–5.0)
Alkaline Phosphatase: 270 U/L — ABNORMAL HIGH (ref 40–150)
Anion Gap: 10 mEq/L (ref 3–11)
BUN: 10.5 mg/dL (ref 7.0–26.0)
CHLORIDE: 103 meq/L (ref 98–109)
CO2: 28 meq/L (ref 22–29)
CREATININE: 0.9 mg/dL (ref 0.7–1.3)
Calcium: 9.1 mg/dL (ref 8.4–10.4)
EGFR: >90 mL/min/{1.73_m2} (ref >90)
Glucose: 230 mg/dl — ABNORMAL HIGH (ref 70–140)
Potassium: 3.9 mEq/L (ref 3.5–5.1)
SODIUM: 140 meq/L (ref 136–145)
Total Bilirubin: 0.3 mg/dL (ref 0.20–1.20)
Total Protein: 8.6 g/dL — ABNORMAL HIGH (ref 6.4–8.3)

## 2015-08-09 MED ORDER — DIPHENHYDRAMINE HCL 50 MG/ML IJ SOLN
50.0000 mg | Freq: Once | INTRAMUSCULAR | Status: AC
  Administered 2015-08-09: 50 mg via INTRAVENOUS

## 2015-08-09 MED ORDER — SODIUM CHLORIDE 0.9 % IV SOLN
427.5000 mg | Freq: Once | INTRAVENOUS | Status: AC
  Administered 2015-08-09: 430 mg via INTRAVENOUS
  Filled 2015-08-09: qty 43

## 2015-08-09 MED ORDER — PALONOSETRON HCL INJECTION 0.25 MG/5ML
0.2500 mg | Freq: Once | INTRAVENOUS | Status: AC
  Administered 2015-08-09: 0.25 mg via INTRAVENOUS

## 2015-08-09 MED ORDER — PACLITAXEL CHEMO INJECTION 300 MG/50ML
175.0000 mg/m2 | Freq: Once | INTRAVENOUS | Status: AC
Start: 2015-08-09 — End: 2015-08-09
  Administered 2015-08-09: 306 mg via INTRAVENOUS
  Filled 2015-08-09: qty 51

## 2015-08-09 MED ORDER — FAMOTIDINE IN NACL 20-0.9 MG/50ML-% IV SOLN
INTRAVENOUS | Status: AC
  Filled 2015-08-09: qty 50

## 2015-08-09 MED ORDER — DIPHENHYDRAMINE HCL 50 MG/ML IJ SOLN
INTRAMUSCULAR | Status: AC
  Filled 2015-08-09: qty 1

## 2015-08-09 MED ORDER — PACLITAXEL CHEMO INJECTION 300 MG/50ML: 175.0000 mg/m2 | Freq: Once | INTRAVENOUS | Status: DC

## 2015-08-09 MED ORDER — FAMOTIDINE IN NACL 20-0.9 MG/50ML-% IV SOLN
20.0000 mg | Freq: Once | INTRAVENOUS | Status: AC
  Administered 2015-08-09: 20 mg via INTRAVENOUS

## 2015-08-09 MED ORDER — SODIUM CHLORIDE 0.9% FLUSH
10.0000 mL | INTRAVENOUS | Status: DC | PRN
Start: 2015-08-09 — End: 2015-08-09
  Administered 2015-08-09: 10 mL
  Filled 2015-08-09: qty 10

## 2015-08-09 MED ORDER — SODIUM CHLORIDE 0.9 % IV SOLN
Freq: Once | INTRAVENOUS | Status: AC
  Administered 2015-08-09: 13:00:00 via INTRAVENOUS

## 2015-08-09 MED ORDER — PROCHLORPERAZINE MALEATE 10 MG PO TABS: 10.0000 mg | ORAL_TABLET | Freq: Four times a day (QID) | ORAL | Status: AC | PRN

## 2015-08-09 MED ORDER — SODIUM CHLORIDE 0.9 % IV SOLN
20.0000 mg | Freq: Once | INTRAVENOUS | Status: AC
  Administered 2015-08-09: 20 mg via INTRAVENOUS
  Filled 2015-08-09: qty 2

## 2015-08-09 MED ORDER — HEPARIN SOD (PORK) LOCK FLUSH 100 UNIT/ML IV SOLN
500.0000 [IU] | Freq: Once | INTRAVENOUS | Status: AC | PRN
  Administered 2015-08-09: 500 [IU]
  Filled 2015-08-09: qty 5

## 2015-08-09 MED ORDER — PALONOSETRON HCL INJECTION 0.25 MG/5ML
INTRAVENOUS | Status: AC
  Filled 2015-08-09: qty 5

## 2015-08-09 MED FILL — PROCHLORPERAZINE 10 MG TAB: 10 | 7 days supply | Qty: 30 | Fill #0

## 2015-08-09 NOTE — Progress Notes (Signed)
[facility]  Telephone:([phone] Fax:([phone]) [phone]  Clinic Follow up Note   Patient Care Team: Glendale Chard, MD as PCP - General (Internal Medicine) Adrian Prows, MD as Consulting Physician (Cardiology) Jovita Gamma, MD as Consulting Physician (Neurosurgery) Lowella Bandy, MD as Consulting Physician (Urology) Grace Isaac, MD as Consulting Physician (Cardiothoracic Surgery) Truitt Merle, MD as Consulting Physician (Hematology) Tania Ade, RN as Registered Nurse Carol Ada, MD as Consulting Physician (Gastroenterology) Gery Pray, MD as Consulting Physician (Radiation Oncology) 08/09/2015  CHIEF COMPLAINTS:  Follow up esophageal squamous cell carcinoma  Oncology History   Squamous cell esophageal cancer   Staging form: Esophagus - Squamous Cell Carcinoma, AJCC 7th Edition     Clinical: Stage IV (TX, N0, M1) - Unsigned       Squamous cell esophageal cancer ([place])   01/02/2015 Initial Diagnosis Squamous cell esophageal cancer   01/02/2015 Procedure EGD by Dr. Benson Norway showed a large, fungating mass with no bleeding and with stigmata met of recent bleeding was found in the third of the esophagus. The mass was completely obstructing and circumferential. The stomach and examined duodenum were normal.   01/02/2015 Imaging CT CAP showed 9 cm segment of irregular his social worker thickening. Single enlarged subcarinal lymph node, bilateral pulmonary nodules (3), 64m right middle lobe next to right atrium, 3 RUL and a 7 mm lingular lung nodules.    01/02/2015 Initial Biopsy Esophageal mass biopsy showed poorly differentiated squamous cell carcinoma.   01/18/2015 Imaging PET scan showed hypermetabolic esophageal mass, subcarinal and possible right hilar adenopathy, several lung nodules, and a hypermetabolic bone lesion in right ilium, compatible with metastatic disease.    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    HISTORY OF PRESENTING ILLNESS:  Andre DARTT70y.o. male is here because of recently diagnosed esophageal squamous cell carcinoma.  He has had dysphagia for one month, with solid food mainly, he is able to keep liquid and soft diet down. He lost about 30 lbs in the the past month, but his weight has been stable in the past week since he started taking boost. He had some chest pain when he swallows. He is constipated, on stool softener, no bleeding.  He otherwise feels fine. His energy level is fair, he functions very well, no limitation on his physical activities. No nausea, no other pain, cough or other, he drinks about 2 boost a day, soup,and some soft diet.  He was referred to gastroenterologist Dr. HBenson Norwayby his primary care physician. He underwent EGD which showed a circumferential, obstructing, fungating mass in the third esophagus, biopsy showed poorly differentiated squamous cell carcinoma. CT scan chest abdomen and pelvis showed 3 lung nodules, with the largest 1.2 cm in the right middle lobe next to the right atrium.  CURRENT THERAPY: second line chemo Carboplatin AUC 5 and paclitaxel 1 75 mg/m, every 3 weeks, starting 08/09/2015   INTERIM HISTORY GNaithanreturns for follow-up. He is accompanied by his wife to the clinic today. He overall feels better than last week, has recovered well from chemotherapy. He denies significant pain, dysphagia, or other symptoms. He has occasional dry cough, no fever or chills. He  has been eating well, weight is stable.  MEDICAL HISTORY:  Past Medical History  Diagnosis Date  . Coronary artery disease   . Shortness of breath   . Seizures ([place])   .  Hypertension   . Pneumonia   . COPD (chronic obstructive pulmonary disease) ([place])   . Stroke ([place])   . Esophageal cancer ([place])   . Radiation 01/25/15-02/13/15    35 gray for esophageal cancer    SURGICAL HISTORY: Past Surgical History  Procedure Laterality Date  . Cardiac catheterization    . Coronary angioplasty with stent placement  07/22/2011  . Cardiac valve surgery    . Coronary artery bypass graft    . Lower extremity angiogram N/A 07/01/2011    Procedure: LOWER EXTREMITY ANGIOGRAM;  Surgeon: Laverda Page, MD;  Location[facility] C[facility] LAB;  Service: Cardiovascular;  Laterality: N/A;  . Left heart catheterization with coronary angiogram N/A 07/01/2011    Procedure: LEFT HEART CATHETERIZATION WITH CORONARY ANGIOGRAM;  Surgeon: Laverda Page, MD;  Location: [facility] CATH LAB;  Service: Cardiovascular;  Laterality: N/A;  . Percutaneous coronary rotoblator intervention (pci-r) N/A 07/22/2011    Procedure: PERCUTANEOUS CORONARY ROTOBLATOR INTERVENTION (PCI-R);  Surgeon: Laverda Page, MD;  Location: [facility] CATH LAB;  Service: Cardiovascular;  Laterality: N/A;  . Left heart catheterization with coronary angiogram N/A 10/30/2011    Procedure: LEFT HEART CATHETERIZATION WITH CORONARY ANGIOGRAM;  Surgeon: Laverda Page, MD;  Location[facility] CATH LAB;  Service: Cardiovascular;  Laterality: N/A;  . Esophagogastroduodenoscopy  01/02/15    SOCIAL HISTORY: Social History   Social History  . Marital Status: Married, lives with his wife     Spouse Name: N/A  . Number of Children: 71, 1 daughter and 3 sons, all lives in [place]   . Years of Education: N/A   Occupational History  . Retired Nature conservation officer    Social History Main Topics  . Smoking status: Current Every Day Smoker -- 0.50 packs/day for 50 years    Types: Cigarettes  . Smokeless tobacco: Never Used  . 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 Drug Use: No  . Sexual Activity:  Not on file     Comment: did not ask   Other Topics Concern  . Not on file   Social History Narrative    FAMILY HISTORY: Family History  Problem Relation Age of Onset  . Heart disease Mother   . Hypertension Mother   . Heart disease Sister   . Hypertension Sister   . Heart disease Brother   . Hypertension Daughter     ALLERGIES:  has No Known Allergies.  MEDICATIONS:  Current Outpatient Prescriptions  Medication Sig Dispense Refill  . albuterol (PROVENTIL HFA;VENTOLIN HFA) 108 (90 BASE) MCG/ACT inhaler Inhale 1-2 puffs into the lungs every 6 (six) hours as needed for wheezing or shortness of breath. Reported on 05/17/2015    . aspirin EC 81 MG tablet Take 81 mg by mouth daily.    . feeding supplement (BOOST / RESOURCE BREEZE) LIQD Take 1 Container by mouth 3 (three) times daily between meals. (Patient taking differently: Take 1 Container by mouth 2 (two) times daily between meals. )  0  . HYDROcodone-acetaminophen (HYCET) 7.5-325 mg/15 ml solution Take 10 mLs by mouth every 6 (six) hours as needed for moderate pain. 120 mL 0  . lisinopril (PRINIVIL) 10 MG tablet Take 1 tablet (10 mg total) by  mouth daily. 30 tablet 1  . magic mouthwash w/lidocaine SOLN Take 5 mLs by mouth 4 (four) times daily. 300 mL 0  . mirtazapine (REMERON) 15 MG tablet Take 15 mg by mouth at bedtime.     . phenytoin (DILANTIN) 100 MG ER capsule Take 2 caps twice a day 120 capsule 11  . pravastatin (PRAVACHOL) 40 MG tablet Take 40 mg by mouth every evening.     Marland Kitchen PRESCRIPTION MEDICATION He receives his chemo treatments at the [facility] with Dr. Burr Medico. He is on a 14 day cycle of FOLFOX.    Marland Kitchen terazosin (HYTRIN) 10 MG capsule Take 10 mg by mouth at bedtime.   0   No current facility-administered medications for this visit.    REVIEW OF SYSTEMS:   Constitutional: Denies fevers, chills or abnormal night sweats, (+) weight loss  Eyes: Denies blurriness of vision, double vision or  watery eyes Ears, nose, mouth, throat, and face: Denies mucositis or sore throat Respiratory: Denies cough, dyspnea or wheezes Cardiovascular: Denies palpitation, chest discomfort or lower extremity swelling Gastrointestinal:  (+) Dysphagia, mild heartburn, Denies nausea, vomiting or change in bowel habits Skin: Denies abnormal skin rashes Lymphatics: Denies new lymphadenopathy or easy bruising Neurological:Denies numbness, tingling or new weaknesses Behavioral/Psych: Mood is stable, no new changes  All other systems were reviewed with the patient and are negative.  PHYSICAL EXAMINATION: ECOG PERFORMANCE STATUS: 1 - Symptomatic but completely ambulatory  Filed Vitals:   08/09/15 1129  BP: 134/77  Pulse: 89  Temp: 98.5 F (36.9 C)  Resp: 18   Filed Weights   08/09/15 1129  Weight: 141 lb 4.8 oz (64.093 kg)    GENERAL:alert, no distress and comfortable SKIN: skin color, texture, turgor are normal, no rashes or significant lesions EYES: normal, conjunctiva are pink and non-injected, sclera clear OROPHARYNX:no exudate, no erythema and lips, buccal mucosa, and tongue normal  NECK: supple, thyroid normal size, non-tender, without nodularity LYMPH:  no palpable lymphadenopathy in the cervical, axillary or inguinal LUNGS: clear to auscultation and percussion with normal breathing effort HEART: regular rate & rhythm and no murmurs and no lower extremity edema ABDOMEN:abdomen soft, non-tender and normal bowel sounds Musculoskeletal:no cyanosis of digits and no clubbing  PSYCH: alert & oriented x 3 with fluent speech NEURO: no focal motor/sensory deficits  LABORATORY DATA:  I have reviewed the data as listed CBC Latest Ref Rng 08/09/2015 08/02/2015 07/26/2015  WBC 4.0 - 10.3 10e3/uL 4.1 4.4 3.0(L)  Hemoglobin 13.0 - 17.1 g/dL 11.6(L) 11.7(L) 12.1(L)  Hematocrit 38.4 - 49.9 % 33.9(L) 33.5(L) 35.0(L)  Platelets 140 - 400 10e3/uL 232 91(L) 173    CMP Latest Ref Rng 08/09/2015 08/02/2015  07/26/2015  Glucose 70 - 140 mg/dl 230(H) 110 113  BUN 7.0 - 26.0 mg/dL 10.5 12.9 20.0  Creatinine 0.7 - 1.3 mg/dL 0.9 0.8 0.9  Sodium 136 - 145 mEq/L 140 139 138  Potassium 3.5 - 5.1 mEq/L 3.9 3.8 3.9  CO2 22 - 29 mEq/L '28 27 27  '$ Calcium 8.4 - 10.4 mg/dL 9.1 9.2 9.5  Total Protein 6.4 - 8.3 g/dL 8.6(H) 8.5(H) 9.2(H)  Total Bilirubin 0.20 - 1.20 mg/dL 0.30 0.39 0.47  Alkaline Phos 40 - 150 U/L 270(H) 236(H) 280(H)  AST 5 - 34 U/L '28 26 31  '$ ALT 0 - 55 U/L '12 11 10   '$ ANC 2.4 today  Pathology report  Diagnosis 01/26/2015 Bone, biopsy, r iliac - 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 Microscopic Comment The metastatic carcinoma is positive by immunohistochemistry with p63, cytokeratin 903 and cytokeratin 5/6. The tumor is negative with CD56, synaptophysin, chromogranin, thyroid transcription factor-1 and S100. (JDP:gt, 01/30/15)   RADIOGRAPHIC STUDIES: I have personally reviewed the radiological images as listed and agreed with the findings in the report.  PET 01/18/2015 IMPRESSION: 1. Previously described esophageal mass is diffusely hypermetabolic. Today's study demonstrates associated hypermetabolic subcarinal and possible right hilar lymphadenopathy, several pulmonary nodules in the lungs bilaterally (largest of which are hypermetabolic), and a hypermetabolic lesion in the superior aspect of the right ilium, compatible with metastatic disease. 2. Atherosclerosis, including left main and 3 vessel coronary artery disease. 3. Additional incidental findings, as above.  PET 05/29/2015 IMPRESSION: Mixed response to therapy, with marked decrease in hypermetabolic esophageal mass and resolution of mild mediastinal and right hilar lymphadenopathy.  Mild progression of bilateral pulmonary metastases.  Mild progression of bone metastases in the right ilium and right posterior twelfth rib.  PET 07/30/2015 IMPRESSION: Stable mild residual hypermetabolism  involving the mid esophagus.  Possible mild progression of bilateral pulmonary nodules/metastases, with new hypermetabolism in the left suprahilar region and possible mild progression of a 9 mm posterior left upper lobe nodule. Additional bilateral pulmonary nodules/metastases are grossly unchanged, including the dominant 15 x 11 mm nodule in the medial right middle lobe.  Stable osseous metastases involving the right posterior 12th rib and right iliac bone.  ASSESSMENT & PLAN: 70 year old African-American male, with past medical history of hypertension, history of stroke, coronary artery disease, who presents with progressive dysphasia.  1. Distal esophagus squamous cell carcinoma, [id number] with lung and bone mets -I previously reviewed his EGD, CT and PET scan findings and the biopsy results in great details with patient and his daughter. -I discussed his right iliac bone biopsy results with patient and his son, unfortunately biopsy confirmed metastatic squamous cell carcinoma. -We reviewed the natural course of esophagitis cancer, and the incurable nature of his disease. We emphasized the goal of therapy is palliation and prolonged his life. -He has completed a short course of palliative radiation  -He has been on first-line FOLFOX for 5 months, tolerating well, overall feels better. -I discussed his restaging PET scan from 07/30/2015 and are reviewed the image personally with his son. Comparing to his initial PET scan in September 2016, his right iliac bone metastasis has been significant progressed, the lung lesions are slightly increased in size also. -I recommend him to switch to second line chemotherapy with carboplatin and Taxol, every 3 weeks, due to the overall disease progression, patient agrees. -Lab results reviewed with patient, adequate for treatment, we'll proceed first cycle today.  2. Seizure -he had a seizure episode this morning. Resolved now. He is compliant with  Dilantin. -I strongly encouraged him to follow-up with his urologist.  3. HTN, CAD, history of CVA -Follow up with primary care physician -We'll watch his blood pressure closely when he is on chemotherapy, normal lately.  4. 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  -His mild anemia has been stable, no need for transfusion, we'll continue monitoring  5. Hyperglycemia -his BG was 230 on lab today, no history of DM -I strongly encourage him to follow up with PCP, I will monitor his BG closely when on chemo   Plan -Lab reviewed, will start first cycle carboplatin AUC 5, paclitaxel 175  mg/m, today  -I'll see him back in 1 week  All questions were answered. The patient knows to call the clinic with any problems, questions or concerns.  I spent 20 minutes counseling the patient face to face. The total time spent in the appointment was 25 minutes and more than 50% was on counseling.     Truitt Merle, MD 08/09/2015

## 2015-08-09 NOTE — Patient Instructions (Signed)
[facility] Discharge Instructions for Patients Receiving Chemotherapy  Today you received the following chemotherapy agents:  Taxol and Carboplatin.  To help prevent nausea and vomiting after your treatment, we encourage you to take your nausea medication as prescribed.   If you develop nausea and vomiting that is not controlled by your nausea medication, call the clinic.   BELOW ARE SYMPTOMS THAT SHOULD BE REPORTED IMMEDIATELY:  *FEVER GREATER THAN 100.5 F  *CHILLS WITH OR WITHOUT FEVER  NAUSEA AND VOMITING THAT IS NOT CONTROLLED WITH YOUR NAUSEA MEDICATION  *UNUSUAL SHORTNESS OF BREATH  *UNUSUAL BRUISING OR BLEEDING  TENDERNESS IN MOUTH AND THROAT WITH OR WITHOUT PRESENCE OF ULCERS  *URINARY PROBLEMS  *BOWEL PROBLEMS  UNUSUAL RASH Items with * indicate a potential emergency and should be followed up as soon as possible.  Feel free to call the clinic you have any questions or concerns. The clinic phone number is [phone].  Please show the CHEMO ALERT CARD at check-in to the Emergency Department and triage nurse.  Carboplatin injection What is this medicine? CARBOPLATIN (KAR boe pla tin) is a chemotherapy drug. It targets fast dividing cells, like cancer cells, and causes these cells to die. This medicine is used to treat ovarian cancer and many other cancers. This medicine may be used for other purposes; ask your health care provider or pharmacist if you have questions. What should I tell my health care provider before I take this medicine? They need to know if you have any of these conditions: -blood disorders -hearing problems -kidney disease -recent or ongoing radiation therapy -an unusual or allergic reaction to carboplatin, cisplatin, other chemotherapy, other medicines, foods, dyes, or preservatives -pregnant or trying to get pregnant -breast-feeding How should I use this medicine? This drug is usually given as an infusion into a vein. It is  administered in a hospital or clinic by a specially trained health care professional. Talk to your pediatrician regarding the use of this medicine in children. Special care may be needed. Overdosage: If you think you have taken too much of this medicine contact a poison control center or emergency room at once. NOTE: This medicine is only for you. Do not share this medicine with others. What if I miss a dose? It is important not to miss a dose. Call your doctor or health care professional if you are unable to keep an appointment. What may interact with this medicine? -medicines for seizures -medicines to increase blood counts like filgrastim, pegfilgrastim, sargramostim -some antibiotics like amikacin, gentamicin, neomycin, streptomycin, tobramycin -vaccines Talk to your doctor or health care professional before taking any of these medicines: -acetaminophen -aspirin -ibuprofen -ketoprofen -naproxen This list may not describe all possible interactions. Give your health care provider a list of all the medicines, herbs, non-prescription drugs, or dietary supplements you use. Also tell them if you smoke, drink alcohol, or use illegal drugs. Some items may interact with your medicine. What should I watch for while using this medicine? Your condition will be monitored carefully while you are receiving this medicine. You will need important blood work done while you are taking this medicine. This drug may make you feel generally unwell. This is not uncommon, as chemotherapy can affect healthy cells as well as cancer cells. Report any side effects. Continue your course of treatment even though you feel ill unless your doctor tells you to stop. In some cases, you may be given additional medicines to help with side effects. Follow all directions for   their use. Call your doctor or health care professional for advice if you get a fever, chills or sore throat, or other symptoms of a cold or flu. Do not  treat yourself. This drug decreases your body's ability to fight infections. Try to avoid being around people who are sick. This medicine may increase your risk to bruise or bleed. Call your doctor or health care professional if you notice any unusual bleeding. Be careful brushing and flossing your teeth or using a toothpick because you may get an infection or bleed more easily. If you have any dental work done, tell your dentist you are receiving this medicine. Avoid taking products that contain aspirin, acetaminophen, ibuprofen, naproxen, or ketoprofen unless instructed by your doctor. These medicines may hide a fever. Do not become pregnant while taking this medicine. Women should inform their doctor if they wish to become pregnant or think they might be pregnant. There is a potential for serious side effects to an unborn child. Talk to your health care professional or pharmacist for more information. Do not breast-feed an infant while taking this medicine. What side effects may I notice from receiving this medicine? Side effects that you should report to your doctor or health care professional as soon as possible: -allergic reactions like skin rash, itching or hives, swelling of the face, lips, or tongue -signs of infection - fever or chills, cough, sore throat, pain or difficulty passing urine -signs of decreased platelets or bleeding - bruising, pinpoint red spots on the skin, black, tarry stools, nosebleeds -signs of decreased red blood cells - unusually weak or tired, fainting spells, lightheadedness -breathing problems -changes in hearing -changes in vision -chest pain -high blood pressure -low blood counts - This drug may decrease the number of white blood cells, red blood cells and platelets. You may be at increased risk for infections and bleeding. -nausea and vomiting -pain, swelling, redness or irritation at the injection site -pain, tingling, numbness in the hands or feet -problems  with balance, talking, walking -trouble passing urine or change in the amount of urine Side effects that usually do not require medical attention (report to your doctor or health care professional if they continue or are bothersome): -hair loss -loss of appetite -metallic taste in the mouth or changes in taste This list may not describe all possible side effects. Call your doctor for medical advice about side effects. You may report side effects to FDA at 1-800-FDA-1088. Where should I keep my medicine? This drug is given in a hospital or clinic and will not be stored at home. NOTE: This sheet is a summary. It may not cover all possible information. If you have questions about this medicine, talk to your doctor, pharmacist, or health care provider.    2016, [employer]. (2007-08-10 14:38:05) Paclitaxel injection What is this medicine? PACLITAXEL (PAK li TAX el) is a chemotherapy drug. It targets fast dividing cells, like cancer cells, and causes these cells to die. This medicine is used to treat ovarian cancer, breast cancer, and other cancers. This medicine may be used for other purposes; ask your health care provider or pharmacist if you have questions. What should I tell my health care provider before I take this medicine? They need to know if you have any of these conditions: -blood disorders -irregular heartbeat -infection (especially a virus infection such as chickenpox, cold sores, or herpes) -liver disease -previous or ongoing radiation therapy -an unusual or allergic reaction to paclitaxel, alcohol, polyoxyethylated castor oil, other chemotherapy   agents, other medicines, foods, dyes, or preservatives -pregnant or trying to get pregnant -breast-feeding How should I use this medicine? This drug is given as an infusion into a vein. It is administered in a hospital or clinic by a specially trained health care professional. Talk to your pediatrician regarding the use of  this medicine in children. Special care may be needed. Overdosage: If you think you have taken too much of this medicine contact a poison control center or emergency room at once. NOTE: This medicine is only for you. Do not share this medicine with others. What if I miss a dose? It is important not to miss your dose. Call your doctor or health care professional if you are unable to keep an appointment. What may interact with this medicine? Do not take this medicine with any of the following medications: -disulfiram -metronidazole This medicine may also interact with the following medications: -cyclosporine -diazepam -ketoconazole -medicines to increase blood counts like filgrastim, pegfilgrastim, sargramostim -other chemotherapy drugs like cisplatin, doxorubicin, epirubicin, etoposide, teniposide, vincristine -quinidine -testosterone -vaccines -verapamil Talk to your doctor or health care professional before taking any of these medicines: -acetaminophen -aspirin -ibuprofen -ketoprofen -naproxen This list may not describe all possible interactions. Give your health care provider a list of all the medicines, herbs, non-prescription drugs, or dietary supplements you use. Also tell them if you smoke, drink alcohol, or use illegal drugs. Some items may interact with your medicine. What should I watch for while using this medicine? Your condition will be monitored carefully while you are receiving this medicine. You will need important blood work done while you are taking this medicine. This drug may make you feel generally unwell. This is not uncommon, as chemotherapy can affect healthy cells as well as cancer cells. Report any side effects. Continue your course of treatment even though you feel ill unless your doctor tells you to stop. This medicine can cause serious allergic reactions. To reduce your risk you will need to take other medicine(s) before treatment with this medicine. In some  cases, you may be given additional medicines to help with side effects. Follow all directions for their use. Call your doctor or health care professional for advice if you get a fever, chills or sore throat, or other symptoms of a cold or flu. Do not treat yourself. This drug decreases your body's ability to fight infections. Try to avoid being around people who are sick. This medicine may increase your risk to bruise or bleed. Call your doctor or health care professional if you notice any unusual bleeding. Be careful brushing and flossing your teeth or using a toothpick because you may get an infection or bleed more easily. If you have any dental work done, tell your dentist you are receiving this medicine. Avoid taking products that contain aspirin, acetaminophen, ibuprofen, naproxen, or ketoprofen unless instructed by your doctor. These medicines may hide a fever. Do not become pregnant while taking this medicine. Women should inform their doctor if they wish to become pregnant or think they might be pregnant. There is a potential for serious side effects to an unborn child. Talk to your health care professional or pharmacist for more information. Do not breast-feed an infant while taking this medicine. Men are advised not to father a child while receiving this medicine. This product may contain alcohol. Ask your pharmacist or healthcare provider if this medicine contains alcohol. Be sure to tell all healthcare providers you are taking this medicine. Certain medicines, like metronidazole and   disulfiram, can cause an unpleasant reaction when taken with alcohol. The reaction includes flushing, headache, nausea, vomiting, sweating, and increased thirst. The reaction can last from 30 minutes to several hours. What side effects may I notice from receiving this medicine? Side effects that you should report to your doctor or health care professional as soon as possible: -allergic reactions like skin rash,  itching or hives, swelling of the face, lips, or tongue -low blood counts - This drug may decrease the number of white blood cells, red blood cells and platelets. You may be at increased risk for infections and bleeding. -signs of infection - fever or chills, cough, sore throat, pain or difficulty passing urine -signs of decreased platelets or bleeding - bruising, pinpoint red spots on the skin, black, tarry stools, nosebleeds -signs of decreased red blood cells - unusually weak or tired, fainting spells, lightheadedness -breathing problems -chest pain -high or low blood pressure -mouth sores -nausea and vomiting -pain, swelling, redness or irritation at the injection site -pain, tingling, numbness in the hands or feet -slow or irregular heartbeat -swelling of the ankle, feet, hands Side effects that usually do not require medical attention (report to your doctor or health care professional if they continue or are bothersome): -bone pain -complete hair loss including hair on your head, underarms, pubic hair, eyebrows, and eyelashes -changes in the color of fingernails -diarrhea -loosening of the fingernails -loss of appetite -muscle or joint pain -red flush to skin -sweating This list may not describe all possible side effects. Call your doctor for medical advice about side effects. You may report side effects to FDA at 1-800-FDA-1088. Where should I keep my medicine? This drug is given in a hospital or clinic and will not be stored at home. NOTE: This sheet is a summary. It may not cover all possible information. If you have questions about this medicine, talk to your doctor, pharmacist, or health care provider.    2016, [employer]. (2014-12-21 13:02:56)  

## 2015-08-09 NOTE — Telephone Encounter (Signed)
Gave and printed appt sched and avs for pt for March and April °

## 2015-08-14 ENCOUNTER — Telehealth: Payer: Self-pay | Admitting: Neurology

## 2015-08-14 NOTE — Telephone Encounter (Signed)
Dialntin total level done 07/20/15: 10.9

## 2015-08-17 ENCOUNTER — Ambulatory Visit (HOSPITAL_BASED_OUTPATIENT_CLINIC_OR_DEPARTMENT_OTHER): Payer: Medicare Other | Admitting: Hematology

## 2015-08-17 ENCOUNTER — Other Ambulatory Visit (HOSPITAL_BASED_OUTPATIENT_CLINIC_OR_DEPARTMENT_OTHER): Payer: Medicare Other

## 2015-08-17 ENCOUNTER — Encounter: Payer: Self-pay | Admitting: Hematology

## 2015-08-17 VITALS — BP 113/61 | HR 56 | Temp 98.8°F | Resp 18 | Ht 70.0 in | Wt 138.0 lb

## 2015-08-17 DIAGNOSIS — C155 Malignant neoplasm of lower third of esophagus: Secondary | ICD-10-CM

## 2015-08-17 DIAGNOSIS — C7951 Secondary malignant neoplasm of bone: Secondary | ICD-10-CM | POA: Diagnosis not present

## 2015-08-17 DIAGNOSIS — C159 Malignant neoplasm of esophagus, unspecified: Secondary | ICD-10-CM

## 2015-08-17 DIAGNOSIS — I2510 Atherosclerotic heart disease of native coronary artery without angina pectoris: Secondary | ICD-10-CM

## 2015-08-17 DIAGNOSIS — I10 Essential (primary) hypertension: Secondary | ICD-10-CM

## 2015-08-17 DIAGNOSIS — R739 Hyperglycemia, unspecified: Secondary | ICD-10-CM

## 2015-08-17 DIAGNOSIS — I5033 Acute on chronic diastolic (congestive) heart failure: Secondary | ICD-10-CM

## 2015-08-17 DIAGNOSIS — D6481 Anemia due to antineoplastic chemotherapy: Secondary | ICD-10-CM

## 2015-08-17 DIAGNOSIS — E46 Unspecified protein-calorie malnutrition: Secondary | ICD-10-CM

## 2015-08-17 DIAGNOSIS — D630 Anemia in neoplastic disease: Secondary | ICD-10-CM

## 2015-08-17 LAB — CBC & DIFF AND RETIC
BASO%: 0.3 % (ref 0.0–2.0)
Basophils Absolute: 0 10*3/uL (ref 0.0–0.1)
EOS ABS: 0 10*3/uL (ref 0.0–0.5)
EOS%: 0 % (ref 0.0–7.0)
HCT: 28.3 % — ABNORMAL LOW (ref 38.4–49.9)
HEMOGLOBIN: 9.7 g/dL — AB (ref 13.0–17.1)
IMMATURE RETIC FRACT: 12 % — AB (ref 3.00–10.60)
LYMPH#: 0.8 10*3/uL — AB (ref 0.9–3.3)
LYMPH%: 22.9 % (ref 14.0–49.0)
MCH: 31.5 pg (ref 27.2–33.4)
MCHC: 34.3 g/dL (ref 32.0–36.0)
MCV: 91.9 fL (ref 79.3–98.0)
MONO#: 0.4 10*3/uL (ref 0.1–0.9)
MONO%: 10.9 % (ref 0.0–14.0)
NEUT%: 65.9 % (ref 39.0–75.0)
NEUTROS ABS: 2.3 10*3/uL (ref 1.5–6.5)
PLATELETS: 126 10*3/uL — AB (ref 140–400)
RBC: 3.08 10*6/uL — AB (ref 4.20–5.82)
RDW: 15.7 % — ABNORMAL HIGH (ref 11.0–14.6)
RETIC CT ABS: 5.54 10*3/uL — AB (ref 34.80–93.90)
Retic %: <0.38 % — ABNORMAL LOW (ref 0.5–1.6)
WBC: 3.4 10*3/uL — ABNORMAL LOW (ref 4.0–10.3)

## 2015-08-17 LAB — COMPREHENSIVE METABOLIC PANEL
ALBUMIN: 2.5 g/dL — AB (ref 3.5–5.0)
ALK PHOS: 244 U/L — AB (ref 40–150)
ALT: 16 U/L (ref 0–55)
AST: 35 U/L — ABNORMAL HIGH (ref 5–34)
Anion Gap: 8 mEq/L (ref 3–11)
BILIRUBIN TOTAL: 0.46 mg/dL (ref 0.20–1.20)
BUN: 10.7 mg/dL (ref 7.0–26.0)
CO2: 30 meq/L — AB (ref 22–29)
CREATININE: 0.8 mg/dL (ref 0.7–1.3)
Calcium: 8.9 mg/dL (ref 8.4–10.4)
Chloride: 102 mEq/L (ref 98–109)
GLUCOSE: 143 mg/dL — AB (ref 70–140)
Potassium: 3.8 mEq/L (ref 3.5–5.1)
SODIUM: 140 meq/L (ref 136–145)
TOTAL PROTEIN: 7.9 g/dL (ref 6.4–8.3)

## 2015-08-17 MED ORDER — HYDROCODONE-ACETAMINOPHEN 7.5-325 MG/15ML PO SOLN: 10.0000 mL | Freq: Three times a day (TID) | ORAL | Status: DC | PRN

## 2015-08-17 NOTE — Progress Notes (Signed)
Andre Guzman  [phone] [phone]  Clinic Follow up Note   Patient Care Team: Glendale Chard, MD as PCP - General (Internal Medicine) Adrian Prows, MD as Consulting Physician (Cardiology) Jovita Gamma, MD as Consulting Physician (Neurosurgery) Lowella Bandy, MD as Consulting Physician (Urology) Grace Isaac, MD as Consulting Physician (Cardiothoracic Surgery) Truitt Merle, MD as Consulting Physician (Hematology) Tania Ade, RN as Registered Nurse Carol Ada, MD as Consulting Physician (Gastroenterology) Gery Pray, MD as Consulting Physician (Radiation Oncology) 08/17/2015  CHIEF COMPLAINTS:  Follow up esophageal squamous cell carcinoma  Oncology History   Squamous cell esophageal cancer   Staging form: Esophagus - Squamous Cell Carcinoma, AJCC 7th Edition     Clinical: Stage IV (TX, N0, M1) - Unsigned       Squamous cell esophageal cancer ([place])   01/02/2015 Initial Diagnosis Squamous cell esophageal cancer   01/02/2015 Procedure EGD by Dr. Benson Norway showed a large, fungating mass with no bleeding and with stigmata met of recent bleeding was found in the third of the esophagus. The mass was completely obstructing and circumferential. The stomach and examined duodenum were normal.   01/02/2015 Imaging CT CAP showed 9 cm segment of irregular his social worker thickening. Single enlarged subcarinal lymph node, bilateral pulmonary nodules (3), 81m right middle lobe next to right atrium, 3 RUL and a 7 mm lingular lung nodules.    01/02/2015 Initial Biopsy Esophageal mass biopsy showed poorly differentiated squamous cell carcinoma.   01/18/2015 Imaging PET scan showed hypermetabolic esophageal mass, subcarinal and possible right hilar adenopathy, several lung nodules, and a hypermetabolic bone lesion in right ilium, compatible with metastatic disease.    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    HISTORY OF PRESENTING ILLNESS:  Andre CARMICKLE70y.o. male is here because of recently diagnosed esophageal squamous cell carcinoma.  He has had dysphagia for one month, with solid food mainly, he is able to keep liquid and soft diet down. He lost about 30 lbs in the the past month, but his weight has been stable in the past week since he started taking boost. He had some chest pain when he swallows. He is constipated, on stool softener, no bleeding.  He otherwise feels fine. His energy level is fair, he functions very well, no limitation on his physical activities. No nausea, no other pain, cough or other, he drinks about 2 boost a day, soup,and some soft diet.  He was referred to gastroenterologist Dr. HBenson Norwayby his primary care physician. He underwent EGD which showed a circumferential, obstructing, fungating mass in the third esophagus, biopsy showed poorly differentiated squamous cell carcinoma. CT scan chest abdomen and pelvis showed 3 lung nodules, with the largest 1.2 cm in the right middle lobe next to the right atrium.  CURRENT THERAPY: second line chemo Carboplatin AUC 5 and paclitaxel 1 75 mg/m, every 3 weeks, starting 08/09/2015   INTERIM HISTORY GGraydonreturns for a toxicity check up of his first cycle of carbotaxol. He is accompanied by his wife to the clinic today. He  Tolerated the chemotherapy well, no significant nausea, diarrhea, taste change all other complaints. He has being well.  He  has moderate to severe right hip pain last weekend, last before a few hours, gradually resolved on its own. He denied any injury to that area. No other complaints.  MEDICAL HISTORY:  Past  Medical History  Diagnosis Date  . Coronary artery disease   . Shortness of breath   . Seizures ([place])   . Hypertension   . Pneumonia   . COPD (chronic obstructive pulmonary disease) ([place])   . Stroke ([place])   . Esophageal cancer ([place])   . Radiation 01/25/15-02/13/15    35 gray for esophageal cancer    SURGICAL HISTORY: Past Surgical History  Procedure Laterality Date  . Cardiac catheterization    . Coronary angioplasty with stent placement  07/22/2011  . Cardiac valve surgery    . Coronary artery bypass graft    . Lower extremity angiogram N/A 07/01/2011    Procedure: LOWER EXTREMITY ANGIOGRAM;  Surgeon: Laverda Page, MD;  Location: [facility] CATH LAB;  Service: Cardiovascular;  Laterality: N/A;  . Left heart catheterization with coronary angiogram N/A 07/01/2011    Procedure: LEFT HEART CATHETERIZATION WITH CORONARY ANGIOGRAM;  Surgeon: Laverda Page, MD;  Location: [facility] CATH LAB;  Service: Cardiovascular;  Laterality: N/A;  . Percutaneous coronary rotoblator intervention (pci-r) N/A 07/22/2011    Procedure: PERCUTANEOUS CORONARY ROTOBLATOR INTERVENTION (PCI-R);  Surgeon: Laverda Page, MD;  Location: [facility] CATH LAB;  Service: Cardiovascular;  Laterality: N/A;  . Left heart catheterization with coronary angiogram N/A 10/30/2011    Procedure: LEFT HEART CATHETERIZATION WITH CORONARY ANGIOGRAM;  Surgeon: Laverda Page, MD;  Location: [facility] CATH LAB;  Service: Cardiovascular;  Laterality: N/A;  . Esophagogastroduodenoscopy  01/02/15    SOCIAL HISTORY: Social History   Social History  . Marital Status: Married, lives with his wife     Spouse Name: N/A  . Number of Children: 28, 1 daughter and 3 sons, all lives in [place]   . Years of Education: N/A   Occupational History  . Retired Nature conservation officer    Social History Main Topics  . Smoking status: Current Every Day Smoker -- 0.50 packs/day for 50 years    Types: Cigarettes  . Smokeless tobacco: Never Used  . 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 Drug Use: No  . Sexual Activity: Not on file     Comment: did not ask   Other Topics Concern  . Not on file   Social History Narrative    FAMILY HISTORY: Family History  Problem Relation Age of Onset  . Heart disease Mother   . Hypertension Mother   . Heart disease Sister   . Hypertension Sister   . Heart disease Brother   . Hypertension Daughter     ALLERGIES:  has No Known Allergies.  MEDICATIONS:  Current Outpatient Prescriptions  Medication Sig Dispense Refill  . albuterol (PROVENTIL HFA;VENTOLIN HFA) 108 (90 BASE) MCG/ACT inhaler Inhale 1-2 puffs into the lungs every 6 (six) hours as needed for wheezing or shortness of breath. Reported on 05/17/2015    . aspirin EC 81 MG tablet Take 81 mg by mouth daily.    . feeding supplement (BOOST / RESOURCE BREEZE) LIQD Take 1 Container by mouth 3 (three) times daily between meals. (Patient taking differently: Take 1 Container by mouth 3 (three) times daily between meals. )  0  . HYDROcodone-acetaminophen (HYCET) 7.5-325 mg/15 ml solution Take 10 mLs by mouth every 8 (eight) hours  as needed for moderate pain. 20 mL 0  . lisinopril (PRINIVIL) 10 MG tablet Take 1 tablet (10 mg total) by mouth daily. 30 tablet 1  . magic mouthwash w/lidocaine SOLN Take 5 mLs by mouth 4 (four) times daily. 300 mL 0  . mirtazapine (REMERON) 15 MG tablet Take 15 mg by mouth at bedtime.     . phenytoin (DILANTIN) 100 MG ER capsule Take 2 caps twice a day 120 capsule 11  . pravastatin (PRAVACHOL) 40 MG tablet Take 40 mg by mouth every evening.     Marland Kitchen PRESCRIPTION MEDICATION He receives his chemo treatments at the [facility] with Dr. Burr Medico. He is on a 14 day cycle of FOLFOX.    Marland Kitchen prochlorperazine (COMPAZINE) 10 MG tablet Take 1 tablet (10 mg total) by mouth every 6 (six) hours as needed. 30 tablet 3  . terazosin (HYTRIN) 10 MG capsule Take 10 mg by mouth at  bedtime.   0   No current facility-administered medications for this visit.    REVIEW OF SYSTEMS:   Constitutional: Denies fevers, chills or abnormal night sweats, (+) weight loss  Eyes: Denies blurriness of vision, double vision or watery eyes Ears, nose, mouth, throat, and face: Denies mucositis or sore throat Respiratory: Denies cough, dyspnea or wheezes Cardiovascular: Denies palpitation, chest discomfort or lower extremity swelling Gastrointestinal:  (+) Dysphagia, mild heartburn, Denies nausea, vomiting or change in bowel habits Skin: Denies abnormal skin rashes Lymphatics: Denies new lymphadenopathy or easy bruising Neurological:Denies numbness, tingling or new weaknesses Behavioral/Psych: Mood is stable, no new changes  All other systems were reviewed with the patient and are negative.  PHYSICAL EXAMINATION: ECOG PERFORMANCE STATUS: 1 - Symptomatic but completely ambulatory  Filed Vitals:   08/17/15 1414  BP: 113/61  Pulse: 56  Temp: 98.8 F (37.1 C)  Resp: 18   Filed Weights   08/17/15 1414  Weight: 138 lb (62.596 kg)    GENERAL:alert, no distress and comfortable SKIN: skin color, texture, turgor are normal, no rashes or significant lesions EYES: normal, conjunctiva are pink and non-injected, sclera clear OROPHARYNX:no exudate, no erythema and lips, buccal mucosa, and tongue normal  NECK: supple, thyroid normal size, non-tender, without nodularity LYMPH:  no palpable lymphadenopathy in the cervical, axillary or inguinal LUNGS: clear to auscultation and percussion with normal breathing effort HEART: regular rate & rhythm and no murmurs and no lower extremity edema ABDOMEN:abdomen soft, non-tender and normal bowel sounds Musculoskeletal:no cyanosis of digits and no clubbing  PSYCH: alert & oriented x 3 with fluent speech NEURO: no focal motor/sensory deficits  LABORATORY DATA:  I have reviewed the data as listed CBC Latest Ref Rng 08/17/2015 08/09/2015 08/02/2015    WBC 4.0 - 10.3 10e3/uL 3.4(L) 4.1 4.4  Hemoglobin 13.0 - 17.1 g/dL 9.7(L) 11.6(L) 11.7(L)  Hematocrit 38.4 - 49.9 % 28.3(L) 33.9(L) 33.5(L)  Platelets 140 - 400 10e3/uL 126(L) 232 91(L)    CMP Latest Ref Rng 08/09/2015 08/02/2015 07/26/2015  Glucose 70 - 140 mg/dl 230(H) 110 113  BUN 7.0 - 26.0 mg/dL 10.5 12.9 20.0  Creatinine 0.7 - 1.3 mg/dL 0.9 0.8 0.9  Sodium 136 - 145 mEq/L 140 139 138  Potassium 3.5 - 5.1 mEq/L 3.9 3.8 3.9  CO2 22 - 29 mEq/L '28 27 27  '$ Calcium 8.4 - 10.4 mg/dL 9.1 9.2 9.5  Total Protein 6.4 - 8.3 g/dL 8.6(H) 8.5(H) 9.2(H)  Total Bilirubin 0.20 - 1.20 mg/dL 0.30 0.39 0.47  Alkaline Phos 40 -  150 U/L 270(H) 236(H) 280(H)  AST 5 - 34 U/L '28 26 31  '$ ALT 0 - 55 U/L '12 11 10   '$ ANC 2.4 today  Pathology report  Diagnosis 01/26/2015 Bone, biopsy, r iliac - 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 Microscopic Comment The metastatic carcinoma is positive by immunohistochemistry with p63, cytokeratin 903 and cytokeratin 5/6. The tumor is negative with CD56, synaptophysin, chromogranin, thyroid transcription factor-1 and S100. (JDP:gt, 01/30/15)   RADIOGRAPHIC STUDIES: I have personally reviewed the radiological images as listed and agreed with the findings in the report.  PET 01/18/2015 IMPRESSION: 1. Previously described esophageal mass is diffusely hypermetabolic. Today's study demonstrates associated hypermetabolic subcarinal and possible right hilar lymphadenopathy, several pulmonary nodules in the lungs bilaterally (largest of which are hypermetabolic), and a hypermetabolic lesion in the superior aspect of the right ilium, compatible with metastatic disease. 2. Atherosclerosis, including left main and 3 vessel coronary artery disease. 3. Additional incidental findings, as above.  PET 05/29/2015 IMPRESSION: Mixed response to therapy, with marked decrease in hypermetabolic esophageal mass and resolution of mild mediastinal and right  hilar lymphadenopathy.  Mild progression of bilateral pulmonary metastases.  Mild progression of bone metastases in the right ilium and right posterior twelfth rib.  PET 07/30/2015 IMPRESSION: Stable mild residual hypermetabolism involving the mid esophagus.  Possible mild progression of bilateral pulmonary nodules/metastases, with new hypermetabolism in the left suprahilar region and possible mild progression of a 9 mm posterior left upper lobe nodule. Additional bilateral pulmonary nodules/metastases are grossly unchanged, including the dominant 15 x 11 mm nodule in the medial right middle lobe.  Stable osseous metastases involving the right posterior 12th rib and right iliac bone.  ASSESSMENT & PLAN: 70 year old African-American male, with past medical history of hypertension, history of stroke, coronary artery disease, who presents with progressive dysphasia.  1. Distal esophagus squamous cell carcinoma, [id number] with lung and bone mets -I previously reviewed his EGD, CT and PET scan findings and the biopsy results in great details with patient and his daughter. -I discussed his right iliac bone biopsy results with patient and his son, unfortunately biopsy confirmed metastatic squamous cell carcinoma. -We reviewed the natural course of esophagitis cancer, and the incurable nature of his disease. We emphasized the goal of therapy is palliation and prolonged his life. -He has completed a short course of palliative radiation  -He has been on first-line FOLFOX for 5 months, tolerating well, overall feels better. -I discussed his restaging PET scan from 07/30/2015 and are reviewed the image personally with his son. Comparing to his initial PET scan in September 2016, his right iliac bone metastasis has been significant progressed, the lung lesions are slightly increased in size also. - He is on second line chemotherapy carboplatin and Taxol, every 3 weeks - he tolerated the first  cycle well, will return in 2 weeks for cycle 2  2. Seizure -he had a seizure episode this morning. Resolved now. He is compliant with Dilantin. -I strongly encouraged him to follow-up with his urologist.  3. HTN, CAD, history of CVA -Follow up with primary care physician -We'll watch his blood pressure closely when he is on chemotherapy, normal lately.  4. 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  -His mild anemia has been stable, no need for transfusion, we'll continue monitoring  5. Hyperglycemia -his BG has been in 200-300 range,  no history of DM -I strongly encourage him to follow up with PCP, I will monitor his BG closely when on chemo   6. Right hip pain - Resolved on its own now, not sure if it's related to his right pelvic bone mets  - I give him prescription of Vicodin,  He can use as needed for moderate to severe pain  Plan - I give him a prescription of Vicodin as needed for right hip pain -I'll see him back on 4/13 for cycle 2 chemo   All questions were answered. The patient knows to call the clinic with any problems, questions or concerns.  I spent 10 minutes counseling the patient face to face. The total time spent in the appointment was 15 minutes and more than 50% was on counseling.     Truitt Merle, MD 08/17/2015

## 2015-08-22 ENCOUNTER — Other Ambulatory Visit: Payer: Self-pay | Admitting: Hematology

## 2015-08-29 DIAGNOSIS — N401 Enlarged prostate with lower urinary tract symptoms: Secondary | ICD-10-CM | POA: Diagnosis not present

## 2015-08-29 DIAGNOSIS — Z0000 Encounter for general adult medical examination without abnormal findings: Secondary | ICD-10-CM | POA: Diagnosis not present

## 2015-08-29 DIAGNOSIS — R351 Nocturia: Secondary | ICD-10-CM | POA: Diagnosis not present

## 2015-08-29 DIAGNOSIS — N138 Other obstructive and reflux uropathy: Secondary | ICD-10-CM | POA: Diagnosis not present

## 2015-08-30 ENCOUNTER — Encounter: Payer: Self-pay | Admitting: Hematology

## 2015-08-30 ENCOUNTER — Ambulatory Visit (HOSPITAL_BASED_OUTPATIENT_CLINIC_OR_DEPARTMENT_OTHER): Payer: Medicare Other | Admitting: Hematology

## 2015-08-30 ENCOUNTER — Other Ambulatory Visit (HOSPITAL_BASED_OUTPATIENT_CLINIC_OR_DEPARTMENT_OTHER): Payer: Medicare Other

## 2015-08-30 ENCOUNTER — Telehealth: Payer: Self-pay | Admitting: Hematology

## 2015-08-30 ENCOUNTER — Telehealth: Payer: Self-pay | Admitting: *Deleted

## 2015-08-30 ENCOUNTER — Ambulatory Visit (HOSPITAL_BASED_OUTPATIENT_CLINIC_OR_DEPARTMENT_OTHER): Payer: Medicare Other

## 2015-08-30 VITALS — BP 142/73 | HR 98 | Temp 97.9°F | Resp 18 | Ht 70.0 in | Wt 143.0 lb

## 2015-08-30 DIAGNOSIS — C7951 Secondary malignant neoplasm of bone: Secondary | ICD-10-CM

## 2015-08-30 DIAGNOSIS — C155 Malignant neoplasm of lower third of esophagus: Secondary | ICD-10-CM

## 2015-08-30 DIAGNOSIS — C159 Malignant neoplasm of esophagus, unspecified: Secondary | ICD-10-CM

## 2015-08-30 DIAGNOSIS — G4089 Other seizures: Secondary | ICD-10-CM

## 2015-08-30 DIAGNOSIS — R739 Hyperglycemia, unspecified: Secondary | ICD-10-CM | POA: Diagnosis not present

## 2015-08-30 DIAGNOSIS — D630 Anemia in neoplastic disease: Secondary | ICD-10-CM

## 2015-08-30 DIAGNOSIS — I10 Essential (primary) hypertension: Secondary | ICD-10-CM

## 2015-08-30 DIAGNOSIS — I2510 Atherosclerotic heart disease of native coronary artery without angina pectoris: Secondary | ICD-10-CM

## 2015-08-30 DIAGNOSIS — D649 Anemia, unspecified: Secondary | ICD-10-CM

## 2015-08-30 DIAGNOSIS — Z5111 Encounter for antineoplastic chemotherapy: Secondary | ICD-10-CM | POA: Diagnosis not present

## 2015-08-30 DIAGNOSIS — E46 Unspecified protein-calorie malnutrition: Secondary | ICD-10-CM

## 2015-08-30 DIAGNOSIS — I5033 Acute on chronic diastolic (congestive) heart failure: Secondary | ICD-10-CM

## 2015-08-30 LAB — CBC & DIFF AND RETIC
BASO%: 0.2 % (ref 0.0–2.0)
BASOS ABS: 0 10*3/uL (ref 0.0–0.1)
EOS ABS: 0 10*3/uL (ref 0.0–0.5)
EOS%: 0.4 % (ref 0.0–7.0)
HEMATOCRIT: 31.8 % — AB (ref 38.4–49.9)
HEMOGLOBIN: 10.8 g/dL — AB (ref 13.0–17.1)
IMMATURE RETIC FRACT: 14.6 % — AB (ref 3.00–10.60)
LYMPH%: 19.2 % (ref 14.0–49.0)
MCH: 31.8 pg (ref 27.2–33.4)
MCHC: 34 g/dL (ref 32.0–36.0)
MCV: 93.5 fL (ref 79.3–98.0)
MONO#: 0.6 10*3/uL (ref 0.1–0.9)
MONO%: 13 % (ref 0.0–14.0)
NEUT#: 3.1 10*3/uL (ref 1.5–6.5)
NEUT%: 67.2 % (ref 39.0–75.0)
Platelets: 131 10*3/uL — ABNORMAL LOW (ref 140–400)
RBC: 3.4 10*6/uL — ABNORMAL LOW (ref 4.20–5.82)
RDW: 17 % — AB (ref 11.0–14.6)
RETIC %: 1.6 % (ref 0.80–1.80)
Retic Ct Abs: 54.4 10*3/uL (ref 34.80–93.90)
WBC: 4.7 10*3/uL (ref 4.0–10.3)
lymph#: 0.9 10*3/uL (ref 0.9–3.3)

## 2015-08-30 LAB — COMPREHENSIVE METABOLIC PANEL
ALT: <9 U/L (ref 0–55)
AST: 24 U/L (ref 5–34)
Albumin: 2.6 g/dL — ABNORMAL LOW (ref 3.5–5.0)
Alkaline Phosphatase: 372 U/L — ABNORMAL HIGH (ref 40–150)
Anion Gap: 7 mEq/L (ref 3–11)
BUN: 8.7 mg/dL (ref 7.0–26.0)
CHLORIDE: 105 meq/L (ref 98–109)
CO2: 30 meq/L — AB (ref 22–29)
Calcium: 8.8 mg/dL (ref 8.4–10.4)
Creatinine: 0.8 mg/dL (ref 0.7–1.3)
GLUCOSE: 116 mg/dL (ref 70–140)
POTASSIUM: 3.5 meq/L (ref 3.5–5.1)
SODIUM: 142 meq/L (ref 136–145)
TOTAL PROTEIN: 8.1 g/dL (ref 6.4–8.3)
Total Bilirubin: <0.3 mg/dL (ref 0.20–1.20)

## 2015-08-30 LAB — IRON AND TIBC
%SAT: 52 % (ref 20–55)
IRON: 101 ug/dL (ref 42–163)
TIBC: 193 ug/dL — AB (ref 202–409)
UIBC: 92 ug/dL — AB (ref 117–376)

## 2015-08-30 LAB — FERRITIN: Ferritin: 287 ng/ml (ref 22–316)

## 2015-08-30 MED ORDER — PALONOSETRON HCL INJECTION 0.25 MG/5ML
INTRAVENOUS | Status: AC
  Filled 2015-08-30: qty 5

## 2015-08-30 MED ORDER — PALONOSETRON HCL INJECTION 0.25 MG/5ML
0.2500 mg | Freq: Once | INTRAVENOUS | Status: AC
  Administered 2015-08-30: 0.25 mg via INTRAVENOUS

## 2015-08-30 MED ORDER — SODIUM CHLORIDE 0.9 % IV SOLN
175.0000 mg/m2 | Freq: Once | INTRAVENOUS | Status: AC
  Administered 2015-08-30: 306 mg via INTRAVENOUS
  Filled 2015-08-30: qty 51

## 2015-08-30 MED ORDER — DIPHENHYDRAMINE HCL 50 MG/ML IJ SOLN
INTRAMUSCULAR | Status: AC
Start: 2015-08-30 — End: 2015-08-30
  Filled 2015-08-30: qty 1

## 2015-08-30 MED ORDER — SODIUM CHLORIDE 0.9% FLUSH
10.0000 mL | INTRAVENOUS | Status: DC | PRN
  Administered 2015-08-30: 10 mL
  Filled 2015-08-30: qty 10

## 2015-08-30 MED ORDER — FAMOTIDINE IN NACL 20-0.9 MG/50ML-% IV SOLN
20.0000 mg | Freq: Once | INTRAVENOUS | Status: AC
  Administered 2015-08-30: 20 mg via INTRAVENOUS

## 2015-08-30 MED ORDER — DIPHENHYDRAMINE HCL 50 MG/ML IJ SOLN
50.0000 mg | Freq: Once | INTRAMUSCULAR | Status: AC
  Administered 2015-08-30: 50 mg via INTRAVENOUS

## 2015-08-30 MED ORDER — CARBOPLATIN CHEMO INJECTION 600 MG/60ML
427.5000 mg | Freq: Once | INTRAVENOUS | Status: AC
  Administered 2015-08-30: 430 mg via INTRAVENOUS
  Filled 2015-08-30: qty 43

## 2015-08-30 MED ORDER — SODIUM CHLORIDE 0.9 % IV SOLN
20.0000 mg | Freq: Once | INTRAVENOUS | Status: AC
  Administered 2015-08-30: 20 mg via INTRAVENOUS
  Filled 2015-08-30: qty 2

## 2015-08-30 MED ORDER — FAMOTIDINE IN NACL 20-0.9 MG/50ML-% IV SOLN
INTRAVENOUS | Status: AC
  Filled 2015-08-30: qty 50

## 2015-08-30 MED ORDER — SODIUM CHLORIDE 0.9 % IV SOLN
Freq: Once | INTRAVENOUS | Status: AC
  Administered 2015-08-30: 11:00:00 via INTRAVENOUS

## 2015-08-30 MED ORDER — HEPARIN SOD (PORK) LOCK FLUSH 100 UNIT/ML IV SOLN
500.0000 [IU] | Freq: Once | INTRAVENOUS | Status: AC | PRN
  Administered 2015-08-30: 500 [IU]
  Filled 2015-08-30: qty 5

## 2015-08-30 NOTE — Telephone Encounter (Signed)
spoke w/ wife.. confrimed appt for may

## 2015-08-30 NOTE — Patient Instructions (Signed)
[facility] Discharge Instructions for Patients Receiving Chemotherapy  Today you received the following chemotherapy agents:  Taxol and Carboplatin.  To help prevent nausea and vomiting after your treatment, we encourage you to take your nausea medication as prescribed.   If you develop nausea and vomiting that is not controlled by your nausea medication, call the clinic.   BELOW ARE SYMPTOMS THAT SHOULD BE REPORTED IMMEDIATELY:  *FEVER GREATER THAN 100.5 F  *CHILLS WITH OR WITHOUT FEVER  NAUSEA AND VOMITING THAT IS NOT CONTROLLED WITH YOUR NAUSEA MEDICATION  *UNUSUAL SHORTNESS OF BREATH  *UNUSUAL BRUISING OR BLEEDING  TENDERNESS IN MOUTH AND THROAT WITH OR WITHOUT PRESENCE OF ULCERS  *URINARY PROBLEMS  *BOWEL PROBLEMS  UNUSUAL RASH Items with * indicate a potential emergency and should be followed up as soon as possible.  Feel free to call the clinic you have any questions or concerns. The clinic phone number is [phone].  Please show the CHEMO ALERT CARD at check-in to the Emergency Department and triage nurse.  Carboplatin injection What is this medicine? CARBOPLATIN (KAR boe pla tin) is a chemotherapy drug. It targets fast dividing cells, like cancer cells, and causes these cells to die. This medicine is used to treat ovarian cancer and many other cancers. This medicine may be used for other purposes; ask your health care provider or pharmacist if you have questions. What should I tell my health care provider before I take this medicine? They need to know if you have any of these conditions: -blood disorders -hearing problems -kidney disease -recent or ongoing radiation therapy -an unusual or allergic reaction to carboplatin, cisplatin, other chemotherapy, other medicines, foods, dyes, or preservatives -pregnant or trying to get pregnant -breast-feeding How should I use this medicine? This drug is usually given as an infusion into a vein. It is  administered in a hospital or clinic by a specially trained health care professional. Talk to your pediatrician regarding the use of this medicine in children. Special care may be needed. Overdosage: If you think you have taken too much of this medicine contact a poison control center or emergency room at once. NOTE: This medicine is only for you. Do not share this medicine with others. What if I miss a dose? It is important not to miss a dose. Call your doctor or health care professional if you are unable to keep an appointment. What may interact with this medicine? -medicines for seizures -medicines to increase blood counts like filgrastim, pegfilgrastim, sargramostim -some antibiotics like amikacin, gentamicin, neomycin, streptomycin, tobramycin -vaccines Talk to your doctor or health care professional before taking any of these medicines: -acetaminophen -aspirin -ibuprofen -ketoprofen -naproxen This list may not describe all possible interactions. Give your health care provider a list of all the medicines, herbs, non-prescription drugs, or dietary supplements you use. Also tell them if you smoke, drink alcohol, or use illegal drugs. Some items may interact with your medicine. What should I watch for while using this medicine? Your condition will be monitored carefully while you are receiving this medicine. You will need important blood work done while you are taking this medicine. This drug may make you feel generally unwell. This is not uncommon, as chemotherapy can affect healthy cells as well as cancer cells. Report any side effects. Continue your course of treatment even though you feel ill unless your doctor tells you to stop. In some cases, you may be given additional medicines to help with side effects. Follow all directions for   their use. Call your doctor or health care professional for advice if you get a fever, chills or sore throat, or other symptoms of a cold or flu. Do not  treat yourself. This drug decreases your body's ability to fight infections. Try to avoid being around people who are sick. This medicine may increase your risk to bruise or bleed. Call your doctor or health care professional if you notice any unusual bleeding. Be careful brushing and flossing your teeth or using a toothpick because you may get an infection or bleed more easily. If you have any dental work done, tell your dentist you are receiving this medicine. Avoid taking products that contain aspirin, acetaminophen, ibuprofen, naproxen, or ketoprofen unless instructed by your doctor. These medicines may hide a fever. Do not become pregnant while taking this medicine. Women should inform their doctor if they wish to become pregnant or think they might be pregnant. There is a potential for serious side effects to an unborn child. Talk to your health care professional or pharmacist for more information. Do not breast-feed an infant while taking this medicine. What side effects may I notice from receiving this medicine? Side effects that you should report to your doctor or health care professional as soon as possible: -allergic reactions like skin rash, itching or hives, swelling of the face, lips, or tongue -signs of infection - fever or chills, cough, sore throat, pain or difficulty passing urine -signs of decreased platelets or bleeding - bruising, pinpoint red spots on the skin, black, tarry stools, nosebleeds -signs of decreased red blood cells - unusually weak or tired, fainting spells, lightheadedness -breathing problems -changes in hearing -changes in vision -chest pain -high blood pressure -low blood counts - This drug may decrease the number of white blood cells, red blood cells and platelets. You may be at increased risk for infections and bleeding. -nausea and vomiting -pain, swelling, redness or irritation at the injection site -pain, tingling, numbness in the hands or feet -problems  with balance, talking, walking -trouble passing urine or change in the amount of urine Side effects that usually do not require medical attention (report to your doctor or health care professional if they continue or are bothersome): -hair loss -loss of appetite -metallic taste in the mouth or changes in taste This list may not describe all possible side effects. Call your doctor for medical advice about side effects. You may report side effects to FDA at 1-800-FDA-1088. Where should I keep my medicine? This drug is given in a hospital or clinic and will not be stored at home. NOTE: This sheet is a summary. It may not cover all possible information. If you have questions about this medicine, talk to your doctor, pharmacist, or health care provider.    2016, [employer]. (2007-08-10 14:38:05) Paclitaxel injection What is this medicine? PACLITAXEL (PAK li TAX el) is a chemotherapy drug. It targets fast dividing cells, like cancer cells, and causes these cells to die. This medicine is used to treat ovarian cancer, breast cancer, and other cancers. This medicine may be used for other purposes; ask your health care provider or pharmacist if you have questions. What should I tell my health care provider before I take this medicine? They need to know if you have any of these conditions: -blood disorders -irregular heartbeat -infection (especially a virus infection such as chickenpox, cold sores, or herpes) -liver disease -previous or ongoing radiation therapy -an unusual or allergic reaction to paclitaxel, alcohol, polyoxyethylated castor oil, other chemotherapy   agents, other medicines, foods, dyes, or preservatives -pregnant or trying to get pregnant -breast-feeding How should I use this medicine? This drug is given as an infusion into a vein. It is administered in a hospital or clinic by a specially trained health care professional. Talk to your pediatrician regarding the use of  this medicine in children. Special care may be needed. Overdosage: If you think you have taken too much of this medicine contact a poison control center or emergency room at once. NOTE: This medicine is only for you. Do not share this medicine with others. What if I miss a dose? It is important not to miss your dose. Call your doctor or health care professional if you are unable to keep an appointment. What may interact with this medicine? Do not take this medicine with any of the following medications: -disulfiram -metronidazole This medicine may also interact with the following medications: -cyclosporine -diazepam -ketoconazole -medicines to increase blood counts like filgrastim, pegfilgrastim, sargramostim -other chemotherapy drugs like cisplatin, doxorubicin, epirubicin, etoposide, teniposide, vincristine -quinidine -testosterone -vaccines -verapamil Talk to your doctor or health care professional before taking any of these medicines: -acetaminophen -aspirin -ibuprofen -ketoprofen -naproxen This list may not describe all possible interactions. Give your health care provider a list of all the medicines, herbs, non-prescription drugs, or dietary supplements you use. Also tell them if you smoke, drink alcohol, or use illegal drugs. Some items may interact with your medicine. What should I watch for while using this medicine? Your condition will be monitored carefully while you are receiving this medicine. You will need important blood work done while you are taking this medicine. This drug may make you feel generally unwell. This is not uncommon, as chemotherapy can affect healthy cells as well as cancer cells. Report any side effects. Continue your course of treatment even though you feel ill unless your doctor tells you to stop. This medicine can cause serious allergic reactions. To reduce your risk you will need to take other medicine(s) before treatment with this medicine. In some  cases, you may be given additional medicines to help with side effects. Follow all directions for their use. Call your doctor or health care professional for advice if you get a fever, chills or sore throat, or other symptoms of a cold or flu. Do not treat yourself. This drug decreases your body's ability to fight infections. Try to avoid being around people who are sick. This medicine may increase your risk to bruise or bleed. Call your doctor or health care professional if you notice any unusual bleeding. Be careful brushing and flossing your teeth or using a toothpick because you may get an infection or bleed more easily. If you have any dental work done, tell your dentist you are receiving this medicine. Avoid taking products that contain aspirin, acetaminophen, ibuprofen, naproxen, or ketoprofen unless instructed by your doctor. These medicines may hide a fever. Do not become pregnant while taking this medicine. Women should inform their doctor if they wish to become pregnant or think they might be pregnant. There is a potential for serious side effects to an unborn child. Talk to your health care professional or pharmacist for more information. Do not breast-feed an infant while taking this medicine. Men are advised not to father a child while receiving this medicine. This product may contain alcohol. Ask your pharmacist or healthcare provider if this medicine contains alcohol. Be sure to tell all healthcare providers you are taking this medicine. Certain medicines, like metronidazole and   disulfiram, can cause an unpleasant reaction when taken with alcohol. The reaction includes flushing, headache, nausea, vomiting, sweating, and increased thirst. The reaction can last from 30 minutes to several hours. What side effects may I notice from receiving this medicine? Side effects that you should report to your doctor or health care professional as soon as possible: -allergic reactions like skin rash,  itching or hives, swelling of the face, lips, or tongue -low blood counts - This drug may decrease the number of white blood cells, red blood cells and platelets. You may be at increased risk for infections and bleeding. -signs of infection - fever or chills, cough, sore throat, pain or difficulty passing urine -signs of decreased platelets or bleeding - bruising, pinpoint red spots on the skin, black, tarry stools, nosebleeds -signs of decreased red blood cells - unusually weak or tired, fainting spells, lightheadedness -breathing problems -chest pain -high or low blood pressure -mouth sores -nausea and vomiting -pain, swelling, redness or irritation at the injection site -pain, tingling, numbness in the hands or feet -slow or irregular heartbeat -swelling of the ankle, feet, hands Side effects that usually do not require medical attention (report to your doctor or health care professional if they continue or are bothersome): -bone pain -complete hair loss including hair on your head, underarms, pubic hair, eyebrows, and eyelashes -changes in the color of fingernails -diarrhea -loosening of the fingernails -loss of appetite -muscle or joint pain -red flush to skin -sweating This list may not describe all possible side effects. Call your doctor for medical advice about side effects. You may report side effects to FDA at 1-800-FDA-1088. Where should I keep my medicine? This drug is given in a hospital or clinic and will not be stored at home. NOTE: This sheet is a summary. It may not cover all possible information. If you have questions about this medicine, talk to your doctor, pharmacist, or health care provider.    2016, [employer]. (2014-12-21 13:02:56)  

## 2015-08-30 NOTE — Telephone Encounter (Signed)
Oncology Nurse Navigator Documentation  Oncology Nurse Navigator Flowsheets 08/30/2015  Navigator Location [facility]  Navigator Encounter Type Treatment  Patient Visit Type MedOnc  Treatment Phase -  Barriers/Navigation Needs No barriers at this time;No Questions;No Needs  Interventions -  Referrals -  Support Groups/Services GI Support Group  Acuity Level 1  Time Spent with Patient 15  Met w/patient in tx area briefly. He has no needs or complaints. No pain, eating well and no transportation difficulty or home needs.

## 2015-08-30 NOTE — Progress Notes (Signed)
[place]  [phone]) [phone] [phone]) [phone]  Clinic Follow up Note   Patient Care Team: Glendale Chard, MD as PCP - General (Internal Medicine) Adrian Prows, MD as Consulting Physician (Cardiology) Jovita Gamma, MD as Consulting Physician (Neurosurgery) Lowella Bandy, MD as Consulting Physician (Urology) Grace Isaac, MD as Consulting Physician (Cardiothoracic Surgery) Truitt Merle, MD as Consulting Physician (Hematology) Tania Ade, RN as Registered Nurse Carol Ada, MD as Consulting Physician (Gastroenterology) Gery Pray, MD as Consulting Physician (Radiation Oncology) 08/30/2015  CHIEF COMPLAINTS:  Follow up esophageal squamous cell carcinoma  Oncology History   Squamous cell esophageal cancer   Staging form: Esophagus - Squamous Cell Carcinoma, AJCC 7th Edition     Clinical: Stage IV (TX, N0, M1) - Unsigned       Squamous cell esophageal cancer ([place])   01/02/2015 Initial Diagnosis Squamous cell esophageal cancer   01/02/2015 Procedure EGD by Dr. Benson Norway showed a large, fungating mass with no bleeding and with stigmata met of recent bleeding was found in the third of the esophagus. The mass was completely obstructing and circumferential. The stomach and examined duodenum were normal.   01/02/2015 Imaging CT CAP showed 9 cm segment of irregular his social worker thickening. Single enlarged subcarinal lymph node, bilateral pulmonary nodules (3), 33m right middle lobe next to right atrium, 3 RUL and a 7 mm lingular lung nodules.    01/02/2015 Initial Biopsy Esophageal mass biopsy showed poorly differentiated squamous cell carcinoma.   01/18/2015 Imaging PET scan showed hypermetabolic esophageal mass, subcarinal and possible right hilar adenopathy, several lung nodules, and a hypermetabolic bone lesion in right ilium, compatible with metastatic disease.    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    HISTORY OF PRESENTING ILLNESS:  Andre KNUEPPEL89y.o. male is here because of recently diagnosed esophageal squamous cell carcinoma.  He has had dysphagia for one month, with solid food mainly, he is able to keep liquid and soft diet down. He lost about 30 lbs in the the past month, but his weight has been stable in the past week since he started taking boost. He had some chest pain when he swallows. He is constipated, on stool softener, no bleeding.  He otherwise feels fine. His energy level is fair, he functions very well, no limitation on his physical activities. No nausea, no other pain, cough or other, he drinks about 2 boost a day, soup,and some soft diet.  He was referred to gastroenterologist Dr. HBenson Norwayby his primary care physician. He underwent EGD which showed a circumferential, obstructing, fungating mass in the third esophagus, biopsy showed poorly differentiated squamous cell carcinoma. CT scan chest abdomen and pelvis showed 3 lung nodules, with the largest 1.2 cm in the right middle lobe next to the right atrium.  CURRENT THERAPY: second line chemo Carboplatin AUC 5 and paclitaxel 1 75 mg/m, every 3 weeks, started 08/09/2015   INTERIM HISTORY GDamarcusreturns for  Follow-up plan the second cycle chemotherapy. He tolerated the first cycle very well, no significant side effects. He feels well overall, denies any dysphagia, pain, or other symptoms,  Except mild fatigue. He functions very well at home,  He  gained about 5 pounds in the past 2 weeks.  MEDICAL HISTORY:  Past Medical History  Diagnosis Date  . Coronary artery disease   . Shortness of breath   . Seizures ([place])   .  Hypertension   . Pneumonia   . COPD (chronic obstructive pulmonary disease) ([place])   . Stroke ([place])   . Esophageal cancer ([place])   . Radiation 01/25/15-02/13/15    35 gray for esophageal cancer    SURGICAL HISTORY: Past Surgical History  Procedure Laterality Date  . Cardiac catheterization    . Coronary angioplasty with stent placement  07/22/2011  . Cardiac valve surgery    . Coronary artery bypass graft    . Lower extremity angiogram N/A 07/01/2011    Procedure: LOWER EXTREMITY ANGIOGRAM;  Surgeon: Laverda Page, MD;  Location: [facility] CATH LAB;  Service: Cardiovascular;  Laterality: N/A;  . Left heart catheterization with coronary angiogram N/A 07/01/2011    Procedure: LEFT HEART CATHETERIZATION WITH CORONARY ANGIOGRAM;  Surgeon: Laverda Page, MD;  [facility] CATH LAB;  Service: Cardiovascular;  Laterality: N/A;  . Percutaneous coronary rotoblator intervention (pci-r) N/A 07/22/2011    Procedure: PERCUTANEOUS CORONARY ROTOBLATOR INTERVENTION (PCI-R);  Surgeon: Laverda Page, MD;  Location: [facility] CATH LAB;  Service: Cardiovascular;  Laterality: N/A;  . Left heart catheterization with coronary angiogram N/A 10/30/2011    Procedure: LEFT HEART CATHETERIZATION WITH CORONARY ANGIOGRAM;  Surgeon: Laverda Page, MD;  Location: [facility] CATH LAB;  Service: Cardiovascular;  Laterality: N/A;  . Esophagogastroduodenoscopy  01/02/15    SOCIAL HISTORY: Social History   Social History  . Marital Status: Married, lives with his wife     Spouse Name: N/A  . Number of Children: 18, 1 daughter and 3 sons, all lives in [place]   . Years of Education: N/A   Occupational History  . Retired Nature conservation officer    Social History Main Topics  . Smoking status: Current Every Day Smoker -- 0.50 packs/day for 50 years    Types: Cigarettes  . Smokeless tobacco: Never Used  . 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 Drug Use: No  . Sexual Activity:  Not on file     Comment: did not ask   Other Topics Concern  . Not on file   Social History Narrative    FAMILY HISTORY: Family History  Problem Relation Age of Onset  . Heart disease Mother   . Hypertension Mother   . Heart disease Sister   . Hypertension Sister   . Heart disease Brother   . Hypertension Daughter     ALLERGIES:  has No Known Allergies.  MEDICATIONS:  Current Outpatient Prescriptions  Medication Sig Dispense Refill  . albuterol (PROVENTIL HFA;VENTOLIN HFA) 108 (90 BASE) MCG/ACT inhaler Inhale 1-2 puffs into the lungs every 6 (six) hours as needed for wheezing or shortness of breath. Reported on 05/17/2015    . aspirin EC 81 MG tablet Take 81 mg by mouth daily.    . feeding supplement (BOOST / RESOURCE BREEZE) LIQD Take 1 Container by mouth 3 (three) times daily between meals. (Patient taking differently: Take 1 Container by mouth 3 (three) times daily between meals. )  0  . HYDROcodone-acetaminophen (HYCET) 7.5-325 mg/15 ml solution Take 10 mLs by mouth every 8 (eight) hours as needed for moderate pain. 20 mL 0  . lisinopril (PRINIVIL) 10 MG tablet Take 1 tablet (10 mg  total) by mouth daily. 30 tablet 1  . magic mouthwash w/lidocaine SOLN Take 5 mLs by mouth 4 (four) times daily. 300 mL 0  . mirtazapine (REMERON) 15 MG tablet Take 15 mg by mouth at bedtime.     . phenytoin (DILANTIN) 100 MG ER capsule Take 2 caps twice a day 120 capsule 11  . pravastatin (PRAVACHOL) 40 MG tablet Take 40 mg by mouth every evening.     Marland Kitchen PRESCRIPTION MEDICATION He receives his chemo treatments at the [facility] with Dr. Burr Medico. He is on a 14 day cycle of FOLFOX.    Marland Kitchen prochlorperazine (COMPAZINE) 10 MG tablet Take 1 tablet (10 mg total) by mouth every 6 (six) hours as needed. 30 tablet 3  . terazosin (HYTRIN) 10 MG capsule Take 10 mg by mouth at bedtime.   0   No current facility-administered medications for this visit.    REVIEW OF SYSTEMS:     Constitutional: Denies fevers, chills or abnormal night sweats, (+) weight loss  Eyes: Denies blurriness of vision, double vision or watery eyes Ears, nose, mouth, throat, and face: Denies mucositis or sore throat Respiratory: Denies cough, dyspnea or wheezes Cardiovascular: Denies palpitation, chest discomfort or lower extremity swelling Gastrointestinal:  (+) Dysphagia, mild heartburn, Denies nausea, vomiting or change in bowel habits Skin: Denies abnormal skin rashes Lymphatics: Denies new lymphadenopathy or easy bruising Neurological:Denies numbness, tingling or new weaknesses Behavioral/Psych: Mood is stable, no new changes  All other systems were reviewed with the patient and are negative.  PHYSICAL EXAMINATION: ECOG PERFORMANCE STATUS: 1 - Symptomatic but completely ambulatory  Filed Vitals:   08/30/15 0934  BP: 142/73  Pulse: 98  Temp: 97.9 F (36.6 C)  Resp: 18   Filed Weights   08/30/15 0934  Weight: 143 lb (64.864 kg)    GENERAL:alert, no distress and comfortable SKIN: skin color, texture, turgor are normal, no rashes or significant lesions EYES: normal, conjunctiva are pink and non-injected, sclera clear OROPHARYNX:no exudate, no erythema and lips, buccal mucosa, and tongue normal  NECK: supple, thyroid normal size, non-tender, without nodularity LYMPH:  no palpable lymphadenopathy in the cervical, axillary or inguinal LUNGS: clear to auscultation and percussion with normal breathing effort HEART: regular rate & rhythm and no murmurs and no lower extremity edema ABDOMEN:abdomen soft, non-tender and normal bowel sounds Musculoskeletal:no cyanosis of digits and no clubbing  PSYCH: alert & oriented x 3 with fluent speech NEURO: no focal motor/sensory deficits  LABORATORY DATA:  I have reviewed the data as listed CBC Latest Ref Rng 08/30/2015 08/17/2015 08/09/2015  WBC 4.0 - 10.3 10e3/uL 4.7 3.4(L) 4.1  Hemoglobin 13.0 - 17.1 g/dL 10.8(L) 9.7(L) 11.6(L)   Hematocrit 38.4 - 49.9 % 31.8(L) 28.3(L) 33.9(L)  Platelets 140 - 400 10e3/uL 131(L) 126(L) 232    CMP Latest Ref Rng 08/17/2015 08/09/2015 08/02/2015  Glucose 70 - 140 mg/dl 143(H) 230(H) 110  BUN 7.0 - 26.0 mg/dL 10.7 10.5 12.9  Creatinine 0.7 - 1.3 mg/dL 0.8 0.9 0.8  Sodium 136 - 145 mEq/L 140 140 139  Potassium 3.5 - 5.1 mEq/L 3.8 3.9 3.8  CO2 22 - 29 mEq/L 30(H) 28 27  Calcium 8.4 - 10.4 mg/dL 8.9 9.1 9.2  Total Protein 6.4 - 8.3 g/dL 7.9 8.6(H) 8.5(H)  Total Bilirubin 0.20 - 1.20 mg/dL 0.46 0.30 0.39  Alkaline Phos 40 - 150 U/L 244(H) 270(H) 236(H)  AST 5 - 34 U/L 35(H) 28 26  ALT 0 - 55 U/L  $'16 12 11   'A$ ANC 2.4 today  Pathology report  Diagnosis 01/26/2015 Bone, biopsy, r iliac - 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 Microscopic Comment The metastatic carcinoma is positive by immunohistochemistry with p63, cytokeratin 903 and cytokeratin 5/6. The tumor is negative with CD56, synaptophysin, chromogranin, thyroid transcription factor-1 and S100. (JDP:gt, 01/30/15)   RADIOGRAPHIC STUDIES: I have personally reviewed the radiological images as listed and agreed with the findings in the report.  PET 01/18/2015 IMPRESSION: 1. Previously described esophageal mass is diffusely hypermetabolic. Today's study demonstrates associated hypermetabolic subcarinal and possible right hilar lymphadenopathy, several pulmonary nodules in the lungs bilaterally (largest of which are hypermetabolic), and a hypermetabolic lesion in the superior aspect of the right ilium, compatible with metastatic disease. 2. Atherosclerosis, including left main and 3 vessel coronary artery disease. 3. Additional incidental findings, as above.  PET 05/29/2015 IMPRESSION: Mixed response to therapy, with marked decrease in hypermetabolic esophageal mass and resolution of mild mediastinal and right hilar lymphadenopathy.  Mild progression of bilateral pulmonary metastases.  Mild  progression of bone metastases in the right ilium and right posterior twelfth rib.  PET 07/30/2015 IMPRESSION: Stable mild residual hypermetabolism involving the mid esophagus.  Possible mild progression of bilateral pulmonary nodules/metastases, with new hypermetabolism in the left suprahilar region and possible mild progression of a 9 mm posterior left upper lobe nodule. Additional bilateral pulmonary nodules/metastases are grossly unchanged, including the dominant 15 x 11 mm nodule in the medial right middle lobe.  Stable osseous metastases involving the right posterior 12th rib and right iliac bone.  ASSESSMENT & PLAN: 70 year old African-American male, with past medical history of hypertension, history of stroke, coronary artery disease, who presents with progressive dysphasia.  1. Distal esophagus squamous cell carcinoma, TxNxM1 with lung and bone mets -I previously reviewed his EGD, CT and PET scan findings and the biopsy results in great details with patient and his daughter. -I discussed his right iliac bone biopsy results with patient and his son, unfortunately biopsy confirmed metastatic squamous cell carcinoma. -We reviewed the natural course of esophagitis cancer, and the incurable nature of his disease. We emphasized the goal of therapy is palliation and prolonged his life. -He has completed a short course of palliative radiation  -He has been on first-line FOLFOX for 5 months, tolerating well, overall feels better. -I discussed his restaging PET scan from 07/30/2015 and are reviewed the image personally with his son. Comparing to his initial PET scan in September 2016, his right iliac bone metastasis has been significant progressed, the lung lesions are slightly increased in size also. - He is on second line chemotherapy carboplatin and Taxol, every 3 weeks - he tolerated the first cycle well,  Lab results reviewed with him, adequate for treatment, we'll proceed second  cycle today  2. Seizure -he had a seizure episode this morning. Resolved now. He is compliant with Dilantin. -I strongly encouraged him to follow-up with his urologist.  3. HTN, CAD, history of CVA -Follow up with primary care physician -We'll watch his blood pressure closely when he is on chemotherapy, normal lately.  4. 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  -His mild anemia has been stable, no need for transfusion, we'll continue monitoring  5. Hyperglycemia -his BG has been in 200-300 range, no history of DM -I strongly encourage him to follow up with PCP,  I will monitor his BG closely when on chemo   6. Right hip pain - Resolved on its own now, not sure if it's related to his right pelvic bone mets  - he has Vicodin at home to be used as needed  Plan - lab reviewed , mild thrombocytopenia, adequate for treatment, CMP still pending,  If adequate will proceed second cycle  Carboplatinum and Taxol today - return to clinic in 3 weeks for next cycle. I'll order restaging PETscan on his next visit.   All questions were answered. The patient knows to call the clinic with any problems, questions or concerns.  I spent 20 minutes counseling the patient face to face. The total time spent in the appointment was 25 minutes and more than 50% was on counseling.     Truitt Merle, MD 08/30/2015

## 2015-08-30 NOTE — Progress Notes (Signed)
Dr. Burr Medico notified about pt alk phos level today. Dr. Burr Medico is okay to continue treatment today.

## 2015-09-11 DIAGNOSIS — G40909 Epilepsy, unspecified, not intractable, without status epilepticus: Secondary | ICD-10-CM | POA: Diagnosis not present

## 2015-09-11 DIAGNOSIS — E441 Mild protein-calorie malnutrition: Secondary | ICD-10-CM | POA: Diagnosis not present

## 2015-09-11 DIAGNOSIS — Z79899 Other long term (current) drug therapy: Secondary | ICD-10-CM | POA: Diagnosis not present

## 2015-09-11 DIAGNOSIS — E291 Testicular hypofunction: Secondary | ICD-10-CM | POA: Diagnosis not present

## 2015-09-11 DIAGNOSIS — I10 Essential (primary) hypertension: Secondary | ICD-10-CM | POA: Diagnosis not present

## 2015-09-11 DIAGNOSIS — I129 Hypertensive chronic kidney disease with stage 1 through stage 4 chronic kidney disease, or unspecified chronic kidney disease: Secondary | ICD-10-CM | POA: Diagnosis not present

## 2015-09-19 ENCOUNTER — Other Ambulatory Visit: Payer: Self-pay

## 2015-09-20 ENCOUNTER — Ambulatory Visit (HOSPITAL_BASED_OUTPATIENT_CLINIC_OR_DEPARTMENT_OTHER): Payer: Medicare Other

## 2015-09-20 ENCOUNTER — Ambulatory Visit (HOSPITAL_BASED_OUTPATIENT_CLINIC_OR_DEPARTMENT_OTHER): Payer: Medicare Other | Admitting: Nurse Practitioner

## 2015-09-20 ENCOUNTER — Ambulatory Visit: Payer: Medicare Other

## 2015-09-20 ENCOUNTER — Telehealth: Payer: Self-pay | Admitting: Hematology

## 2015-09-20 ENCOUNTER — Other Ambulatory Visit (HOSPITAL_BASED_OUTPATIENT_CLINIC_OR_DEPARTMENT_OTHER): Payer: Medicare Other

## 2015-09-20 VITALS — BP 130/77 | HR 99 | Temp 97.8°F | Resp 18 | Ht 70.0 in | Wt 142.6 lb

## 2015-09-20 DIAGNOSIS — I10 Essential (primary) hypertension: Secondary | ICD-10-CM | POA: Diagnosis not present

## 2015-09-20 DIAGNOSIS — C159 Malignant neoplasm of esophagus, unspecified: Secondary | ICD-10-CM

## 2015-09-20 DIAGNOSIS — C7951 Secondary malignant neoplasm of bone: Secondary | ICD-10-CM | POA: Diagnosis not present

## 2015-09-20 DIAGNOSIS — C155 Malignant neoplasm of lower third of esophagus: Secondary | ICD-10-CM

## 2015-09-20 DIAGNOSIS — Z5111 Encounter for antineoplastic chemotherapy: Secondary | ICD-10-CM | POA: Diagnosis not present

## 2015-09-20 LAB — CBC & DIFF AND RETIC
BASO%: 0.2 % (ref 0.0–2.0)
Basophils Absolute: 0 10*3/uL (ref 0.0–0.1)
EOS ABS: 0 10*3/uL (ref 0.0–0.5)
EOS%: 0.5 % (ref 0.0–7.0)
HCT: 27.9 % — ABNORMAL LOW (ref 38.4–49.9)
HGB: 9.5 g/dL — ABNORMAL LOW (ref 13.0–17.1)
IMMATURE RETIC FRACT: 13.8 % — AB (ref 3.00–10.60)
LYMPH%: 25 % (ref 14.0–49.0)
MCH: 32.4 pg (ref 27.2–33.4)
MCHC: 34.1 g/dL (ref 32.0–36.0)
MCV: 95.2 fL (ref 79.3–98.0)
MONO#: 0.6 10*3/uL (ref 0.1–0.9)
MONO%: 13.3 % (ref 0.0–14.0)
NEUT%: 61 % (ref 39.0–75.0)
NEUTROS ABS: 2.6 10*3/uL (ref 1.5–6.5)
NRBC: 0 % (ref 0–0)
Platelets: 144 10*3/uL (ref 140–400)
RBC: 2.93 10*6/uL — ABNORMAL LOW (ref 4.20–5.82)
RDW: 18.6 % — AB (ref 11.0–14.6)
RETIC %: 2.18 % — AB (ref 0.80–1.80)
RETIC CT ABS: 63.87 10*3/uL (ref 34.80–93.90)
WBC: 4.3 10*3/uL (ref 4.0–10.3)
lymph#: 1.1 10*3/uL (ref 0.9–3.3)

## 2015-09-20 LAB — COMPREHENSIVE METABOLIC PANEL
ALBUMIN: 2.8 g/dL — AB (ref 3.5–5.0)
ALK PHOS: 351 U/L — AB (ref 40–150)
AST: 21 U/L (ref 5–34)
Anion Gap: 10 mEq/L (ref 3–11)
BILIRUBIN TOTAL: 0.31 mg/dL (ref 0.20–1.20)
BUN: 10.6 mg/dL (ref 7.0–26.0)
CO2: 27 meq/L (ref 22–29)
CREATININE: 0.7 mg/dL (ref 0.7–1.3)
Calcium: 8.9 mg/dL (ref 8.4–10.4)
Chloride: 105 mEq/L (ref 98–109)
GLUCOSE: 143 mg/dL — AB (ref 70–140)
Potassium: 3.7 mEq/L (ref 3.5–5.1)
SODIUM: 142 meq/L (ref 136–145)
TOTAL PROTEIN: 7.9 g/dL (ref 6.4–8.3)

## 2015-09-20 MED ORDER — PALONOSETRON HCL INJECTION 0.25 MG/5ML
INTRAVENOUS | Status: AC
  Filled 2015-09-20: qty 5

## 2015-09-20 MED ORDER — DIPHENHYDRAMINE HCL 50 MG/ML IJ SOLN
INTRAMUSCULAR | Status: AC
  Filled 2015-09-20: qty 1

## 2015-09-20 MED ORDER — SODIUM CHLORIDE 0.9 % IJ SOLN
10.0000 mL | INTRAMUSCULAR | Status: AC | PRN
  Administered 2015-09-20: 10 mL
  Filled 2015-09-20: qty 10

## 2015-09-20 MED ORDER — SODIUM CHLORIDE 0.9% FLUSH
10.0000 mL | INTRAVENOUS | Status: DC | PRN
  Administered 2015-09-20: 10 mL
  Filled 2015-09-20: qty 10

## 2015-09-20 MED ORDER — FAMOTIDINE IN NACL 20-0.9 MG/50ML-% IV SOLN
20.0000 mg | Freq: Once | INTRAVENOUS | Status: AC
  Administered 2015-09-20: 20 mg via INTRAVENOUS

## 2015-09-20 MED ORDER — SODIUM CHLORIDE 0.9 % IV SOLN
423.0000 mg | Freq: Once | INTRAVENOUS | Status: AC
  Administered 2015-09-20: 420 mg via INTRAVENOUS
  Filled 2015-09-20: qty 42

## 2015-09-20 MED ORDER — PALONOSETRON HCL INJECTION 0.25 MG/5ML
0.2500 mg | Freq: Once | INTRAVENOUS | Status: AC
Start: 2015-09-20 — End: 2015-09-20
  Administered 2015-09-20: 0.25 mg via INTRAVENOUS

## 2015-09-20 MED ORDER — DIPHENHYDRAMINE HCL 50 MG/ML IJ SOLN
50.0000 mg | Freq: Once | INTRAMUSCULAR | Status: AC
  Administered 2015-09-20: 50 mg via INTRAVENOUS

## 2015-09-20 MED ORDER — FAMOTIDINE IN NACL 20-0.9 MG/50ML-% IV SOLN
INTRAVENOUS | Status: AC
  Filled 2015-09-20: qty 50

## 2015-09-20 MED ORDER — SODIUM CHLORIDE 0.9 % IV SOLN
175.0000 mg/m2 | Freq: Once | INTRAVENOUS | Status: AC
  Administered 2015-09-20: 306 mg via INTRAVENOUS
  Filled 2015-09-20: qty 51

## 2015-09-20 MED ORDER — SODIUM CHLORIDE 0.9 % IV SOLN
20.0000 mg | Freq: Once | INTRAVENOUS | Status: AC
  Administered 2015-09-20: 20 mg via INTRAVENOUS
  Filled 2015-09-20: qty 2

## 2015-09-20 MED ORDER — SODIUM CHLORIDE 0.9 % IV SOLN
Freq: Once | INTRAVENOUS | Status: AC
  Administered 2015-09-20: 10:00:00 via INTRAVENOUS

## 2015-09-20 MED ORDER — HEPARIN SOD (PORK) LOCK FLUSH 100 UNIT/ML IV SOLN
500.0000 [IU] | Freq: Once | INTRAVENOUS | Status: AC | PRN
  Administered 2015-09-20: 500 [IU]
  Filled 2015-09-20: qty 5

## 2015-09-20 NOTE — Progress Notes (Signed)
  [place] OFFICE PROGRESS NOTE   Diagnosis:  Esophageal cancer Oncology History   Squamous cell esophageal cancer  Staging form: Esophagus - Squamous Cell Carcinoma, AJCC 7th Edition  Clinical: Stage IV ([place], N0, M1) - Unsigned       Squamous cell esophageal cancer ([place])   01/02/2015 Initial Diagnosis Squamous cell esophageal cancer   01/02/2015 Procedure EGD by Dr. Benson Norway showed a large, fungating mass with no bleeding and with stigmata met of recent bleeding was found in the third of the esophagus. The mass was completely obstructing and circumferential. The stomach and examined duodenum were normal.   01/02/2015 Imaging CT CAP showed 9 cm segment of irregular his social worker thickening. Single enlarged subcarinal lymph node, bilateral pulmonary nodules (3), 73m right middle lobe next to right atrium, 3 RUL and a 7 mm lingular lung nodules.    01/02/2015 Initial Biopsy Esophageal mass biopsy showed poorly differentiated squamous cell carcinoma.   01/18/2015 Imaging PET scan showed hypermetabolic esophageal mass, subcarinal and possible right hilar adenopathy, several lung nodules, and a hypermetabolic bone lesion in right ilium, compatible with metastatic disease.    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        CURRENT THERAPY: second line chemo Carboplatin AUC 5 and paclitaxel 1 75 mg/m, every 3 weeks, started 08/09/2015   INTERVAL HISTORY:   Mr. MTolenreturns as scheduled. He completed cycle 2 carboplatin/Taxol 08/30/2015. He denies nausea/vomiting. No mouth sores. No  diarrhea. He has intermittent tingling in the fingertips. This does not interfere with activity. No numbness. He denies dysphagia. He has a good appetite. No pain.  Objective:  Vital signs in last 24 hours:  Blood pressure 130/77, pulse 99, temperature 97.8 F (36.6 C), temperature source Oral, resp. rate 18, height '5\' 10"'$  (1.778 m), weight 142 lb 9.6 oz (64.683 kg), SpO2 100 %.    HEENT: No thrush or ulcers. Resp: A few scattered rhonchi. No respiratory distress. Cardio: Regular rate and rhythm. GI: Abdomen soft and nontender. No hepatomegaly. Vascular: No leg edema. Calves soft and nontender. Port-A-Cath without erythema.    Lab Results:  Lab Results  Component Value Date   WBC 4.3 09/20/2015   HGB 9.5* 09/20/2015   HCT 27.9* 09/20/2015   MCV 95.2 09/20/2015   PLT 144 09/20/2015   NEUTROABS 2.6 09/20/2015    Imaging:  No results found.  Medications: I have reviewed the patient's current medications.  Assessment/Plan: 1. Distal esophagus squamous cell carcinoma, TxNxM1 with lung and bone mets; status post FOLFOX 02/22/2015 through 07/19/2015; restaging PET scan 07/30/2015 with evidence of progression; cycle 1 carboplatin/Taxol 08/09/2015, cycle 2 carboplatin/Taxol 08/30/2015 2. Seizure. He continues Dilantin. 3. Hypertension, CAD, history of CVA 4. Hyperglycemia 5. Right hip pain. Resolved.   Disposition: Mr. MMancillasappears stable. He has completed 2 cycles of carboplatin/Taxol. Plan to proceed with cycle 3 today as scheduled. Restaging PET scan in approximately 2 weeks. He will return for a follow-up visit in 3 weeks. He will contact the office in the interim with any problems.    TNed CardANP/GNP-BC   09/20/2015  9:39 AM

## 2015-09-20 NOTE — Telephone Encounter (Signed)
Pt will p/u sched in tx room °

## 2015-09-20 NOTE — Patient Instructions (Signed)
[facility] Discharge Instructions for Patients Receiving Chemotherapy  Today you received the following chemotherapy agents Taxol/Carboplatin To help prevent nausea and vomiting after your treatment, we encourage you to take your nausea medication as prescribed.   If you develop nausea and vomiting that is not controlled by your nausea medication, call the clinic.   BELOW ARE SYMPTOMS THAT SHOULD BE REPORTED IMMEDIATELY:  *FEVER GREATER THAN 100.5 F  *CHILLS WITH OR WITHOUT FEVER  NAUSEA AND VOMITING THAT IS NOT CONTROLLED WITH YOUR NAUSEA MEDICATION  *UNUSUAL SHORTNESS OF BREATH  *UNUSUAL BRUISING OR BLEEDING  TENDERNESS IN MOUTH AND THROAT WITH OR WITHOUT PRESENCE OF ULCERS  *URINARY PROBLEMS  *BOWEL PROBLEMS  UNUSUAL RASH Items with * indicate a potential emergency and should be followed up as soon as possible.  Feel free to call the clinic you have any questions or concerns. The clinic phone number is [phone].  Please show the CHEMO ALERT CARD at check-in to the Emergency Department and triage nurse.   

## 2015-10-02 DIAGNOSIS — I25119 Atherosclerotic heart disease of native coronary artery with unspecified angina pectoris: Secondary | ICD-10-CM | POA: Diagnosis not present

## 2015-10-02 DIAGNOSIS — I70213 Atherosclerosis of native arteries of extremities with intermittent claudication, bilateral legs: Secondary | ICD-10-CM | POA: Diagnosis not present

## 2015-10-02 DIAGNOSIS — Z8501 Personal history of malignant neoplasm of esophagus: Secondary | ICD-10-CM | POA: Diagnosis not present

## 2015-10-02 DIAGNOSIS — I10 Essential (primary) hypertension: Secondary | ICD-10-CM | POA: Diagnosis not present

## 2015-10-04 ENCOUNTER — Ambulatory Visit (HOSPITAL_COMMUNITY)
Admission: RE | Admit: 2015-10-04 | Discharge: 2015-10-04 | Disposition: A | Discharge status: Home / Self Care | Payer: Medicare Other | Source: Ambulatory Visit | Attending: Nurse Practitioner | Admitting: Nurse Practitioner

## 2015-10-04 DIAGNOSIS — R918 Other nonspecific abnormal finding of lung field: Secondary | ICD-10-CM | POA: Diagnosis not present

## 2015-10-04 DIAGNOSIS — C7951 Secondary malignant neoplasm of bone: Secondary | ICD-10-CM | POA: Diagnosis not present

## 2015-10-04 DIAGNOSIS — K228 Other specified diseases of esophagus: Secondary | ICD-10-CM | POA: Diagnosis not present

## 2015-10-04 DIAGNOSIS — C159 Malignant neoplasm of esophagus, unspecified: Secondary | ICD-10-CM | POA: Insufficient documentation

## 2015-10-04 DIAGNOSIS — N400 Enlarged prostate without lower urinary tract symptoms: Secondary | ICD-10-CM | POA: Insufficient documentation

## 2015-10-04 LAB — GLUCOSE, CAPILLARY: Glucose-Capillary: 112 mg/dL — ABNORMAL HIGH (ref 65–99)

## 2015-10-04 MED ORDER — FLUDEOXYGLUCOSE F - 18 (FDG) INJECTION: 6.9000 | Freq: Once | INTRAVENOUS | Status: DC | PRN

## 2015-10-11 ENCOUNTER — Telehealth: Payer: Self-pay | Admitting: Hematology

## 2015-10-11 ENCOUNTER — Ambulatory Visit (HOSPITAL_BASED_OUTPATIENT_CLINIC_OR_DEPARTMENT_OTHER): Payer: Medicare Other | Admitting: Hematology

## 2015-10-11 ENCOUNTER — Encounter: Payer: Self-pay | Admitting: Hematology

## 2015-10-11 ENCOUNTER — Other Ambulatory Visit (HOSPITAL_BASED_OUTPATIENT_CLINIC_OR_DEPARTMENT_OTHER): Payer: Medicare Other

## 2015-10-11 ENCOUNTER — Ambulatory Visit (HOSPITAL_BASED_OUTPATIENT_CLINIC_OR_DEPARTMENT_OTHER): Payer: Medicare Other

## 2015-10-11 VITALS — BP 124/67 | HR 87 | Temp 98.2°F | Resp 18 | Ht 70.0 in | Wt 140.5 lb

## 2015-10-11 DIAGNOSIS — E46 Unspecified protein-calorie malnutrition: Secondary | ICD-10-CM

## 2015-10-11 DIAGNOSIS — C155 Malignant neoplasm of lower third of esophagus: Secondary | ICD-10-CM | POA: Diagnosis not present

## 2015-10-11 DIAGNOSIS — D6481 Anemia due to antineoplastic chemotherapy: Secondary | ICD-10-CM | POA: Diagnosis not present

## 2015-10-11 DIAGNOSIS — C7951 Secondary malignant neoplasm of bone: Secondary | ICD-10-CM | POA: Diagnosis not present

## 2015-10-11 DIAGNOSIS — C159 Malignant neoplasm of esophagus, unspecified: Secondary | ICD-10-CM

## 2015-10-11 DIAGNOSIS — I10 Essential (primary) hypertension: Secondary | ICD-10-CM

## 2015-10-11 DIAGNOSIS — Z5111 Encounter for antineoplastic chemotherapy: Secondary | ICD-10-CM | POA: Diagnosis not present

## 2015-10-11 DIAGNOSIS — I5033 Acute on chronic diastolic (congestive) heart failure: Secondary | ICD-10-CM

## 2015-10-11 DIAGNOSIS — D630 Anemia in neoplastic disease: Secondary | ICD-10-CM

## 2015-10-11 DIAGNOSIS — I2510 Atherosclerotic heart disease of native coronary artery without angina pectoris: Secondary | ICD-10-CM

## 2015-10-11 DIAGNOSIS — G4089 Other seizures: Secondary | ICD-10-CM

## 2015-10-11 LAB — COMPREHENSIVE METABOLIC PANEL WITH GFR
ALT: 13 U/L (ref 0–55)
AST: 29 U/L (ref 5–34)
Albumin: 2.9 g/dL — ABNORMAL LOW (ref 3.5–5.0)
Alkaline Phosphatase: 346 U/L — ABNORMAL HIGH (ref 40–150)
Anion Gap: 9 meq/L (ref 3–11)
BUN: 10.8 mg/dL (ref 7.0–26.0)
CO2: 27 meq/L (ref 22–29)
Calcium: 9.3 mg/dL (ref 8.4–10.4)
Chloride: 104 meq/L (ref 98–109)
Creatinine: 0.9 mg/dL (ref 0.7–1.3)
EGFR: >90 ml/min/1.73 m2
Glucose: 150 mg/dL — ABNORMAL HIGH (ref 70–140)
Potassium: 3.9 meq/L (ref 3.5–5.1)
Sodium: 140 meq/L (ref 136–145)
Total Bilirubin: 0.37 mg/dL (ref 0.20–1.20)
Total Protein: 8.4 g/dL — ABNORMAL HIGH (ref 6.4–8.3)

## 2015-10-11 LAB — CBC & DIFF AND RETIC
BASO%: 0.2 % (ref 0.0–2.0)
Basophils Absolute: 0 10e3/uL (ref 0.0–0.1)
EOS%: 0.2 % (ref 0.0–7.0)
Eosinophils Absolute: 0 10e3/uL (ref 0.0–0.5)
HCT: 30.5 % — ABNORMAL LOW (ref 38.4–49.9)
HGB: 10.2 g/dL — ABNORMAL LOW (ref 13.0–17.1)
Immature Retic Fract: 10.7 % — ABNORMAL HIGH (ref 3.00–10.60)
LYMPH%: 18.1 % (ref 14.0–49.0)
MCH: 32.6 pg (ref 27.2–33.4)
MCHC: 33.4 g/dL (ref 32.0–36.0)
MCV: 97.4 fL (ref 79.3–98.0)
MONO#: 0.9 10e3/uL (ref 0.1–0.9)
MONO%: 19.4 % — ABNORMAL HIGH (ref 0.0–14.0)
NEUT#: 3 10e3/uL (ref 1.5–6.5)
NEUT%: 62.1 % (ref 39.0–75.0)
Platelets: 177 10e3/uL (ref 140–400)
RBC: 3.13 10e6/uL — ABNORMAL LOW (ref 4.20–5.82)
RDW: 17.7 % — ABNORMAL HIGH (ref 11.0–14.6)
Retic %: 1.86 % — ABNORMAL HIGH (ref 0.80–1.80)
Retic Ct Abs: 58.22 10e3/uL (ref 34.80–93.90)
WBC: 4.9 10e3/uL (ref 4.0–10.3)
lymph#: 0.9 10e3/uL (ref 0.9–3.3)

## 2015-10-11 MED ORDER — FAMOTIDINE IN NACL 20-0.9 MG/50ML-% IV SOLN
INTRAVENOUS | Status: AC
  Filled 2015-10-11: qty 50

## 2015-10-11 MED ORDER — SODIUM CHLORIDE 0.9% FLUSH
10.0000 mL | INTRAVENOUS | Status: DC | PRN
  Administered 2015-10-11: 10 mL
  Filled 2015-10-11: qty 10

## 2015-10-11 MED ORDER — DIPHENHYDRAMINE HCL 50 MG/ML IJ SOLN
50.0000 mg | Freq: Once | INTRAMUSCULAR | Status: AC
  Administered 2015-10-11: 50 mg via INTRAVENOUS

## 2015-10-11 MED ORDER — DIPHENHYDRAMINE HCL 50 MG/ML IJ SOLN
INTRAMUSCULAR | Status: AC
  Filled 2015-10-11: qty 1

## 2015-10-11 MED ORDER — FAMOTIDINE IN NACL 20-0.9 MG/50ML-% IV SOLN
20.0000 mg | Freq: Once | INTRAVENOUS | Status: AC
  Administered 2015-10-11: 20 mg via INTRAVENOUS

## 2015-10-11 MED ORDER — HEPARIN SOD (PORK) LOCK FLUSH 100 UNIT/ML IV SOLN
500.0000 [IU] | Freq: Once | INTRAVENOUS | Status: AC | PRN
  Administered 2015-10-11: 500 [IU]
  Filled 2015-10-11: qty 5

## 2015-10-11 MED ORDER — PALONOSETRON HCL INJECTION 0.25 MG/5ML
INTRAVENOUS | Status: AC
  Filled 2015-10-11: qty 5

## 2015-10-11 MED ORDER — PALONOSETRON HCL INJECTION 0.25 MG/5ML
0.2500 mg | Freq: Once | INTRAVENOUS | Status: AC
Start: 2015-10-11 — End: 2015-10-11
  Administered 2015-10-11: 0.25 mg via INTRAVENOUS

## 2015-10-11 MED ORDER — SODIUM CHLORIDE 0.9 % IV SOLN
20.0000 mg | Freq: Once | INTRAVENOUS | Status: AC
  Administered 2015-10-11: 20 mg via INTRAVENOUS
  Filled 2015-10-11: qty 2

## 2015-10-11 MED ORDER — SODIUM CHLORIDE 0.9 % IV SOLN
Freq: Once | INTRAVENOUS | Status: AC
  Administered 2015-10-11: 11:00:00 via INTRAVENOUS

## 2015-10-11 MED ORDER — CARBOPLATIN CHEMO INJECTION 600 MG/60ML
423.0000 mg | Freq: Once | INTRAVENOUS | Status: AC
  Administered 2015-10-11: 420 mg via INTRAVENOUS
  Filled 2015-10-11: qty 42

## 2015-10-11 MED ORDER — PACLITAXEL CHEMO INJECTION 300 MG/50ML
175.0000 mg/m2 | Freq: Once | INTRAVENOUS | Status: AC
  Administered 2015-10-11: 306 mg via INTRAVENOUS
  Filled 2015-10-11: qty 51

## 2015-10-11 NOTE — Telephone Encounter (Signed)
Gave and printed appt sched and avs for pt for June and July  °

## 2015-10-11 NOTE — Progress Notes (Signed)
[facility]  [phone]) [phone] [phone]  Clinic Follow up Note   Patient Care Team: Glendale Chard, MD as PCP - General (Internal Medicine) Adrian Prows, MD as Consulting Physician (Cardiology) Jovita Gamma, MD as Consulting Physician (Neurosurgery) Lowella Bandy, MD as Consulting Physician (Urology) Grace Isaac, MD as Consulting Physician (Cardiothoracic Surgery) Truitt Merle, MD as Consulting Physician (Hematology) Tania Ade, RN as Registered Nurse Carol Ada, MD as Consulting Physician (Gastroenterology) Gery Pray, MD as Consulting Physician (Radiation Oncology) 10/11/2015  CHIEF COMPLAINTS:  Follow up esophageal squamous cell carcinoma  Oncology History   Squamous cell esophageal cancer   Staging form: Esophagus - Squamous Cell Carcinoma, AJCC 7th Edition     Clinical: Stage IV (TX, N0, M1) - Unsigned       Squamous cell esophageal cancer ([place])   01/02/2015 Initial Diagnosis Squamous cell esophageal cancer   01/02/2015 Procedure EGD by Dr. Benson Norway showed a large, fungating mass with no bleeding and with stigmata met of recent bleeding was found in the third of the esophagus. The mass was completely obstructing and circumferential. The stomach and examined duodenum were normal.   01/02/2015 Imaging CT CAP showed 9 cm segment of irregular his social worker thickening. Single enlarged subcarinal lymph node, bilateral pulmonary nodules (3), 26m right middle lobe next to right atrium, 3 RUL and a 7 mm lingular lung nodules.    01/02/2015 Initial Biopsy Esophageal mass biopsy showed poorly differentiated squamous cell carcinoma.   01/18/2015 Imaging PET scan showed hypermetabolic esophageal mass, subcarinal and possible right hilar adenopathy, several lung nodules, and a hypermetabolic bone lesion in right ilium, compatible with metastatic disease.    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    HISTORY OF PRESENTING ILLNESS:  Andre [id number]. male is here because of recently diagnosed esophageal squamous cell carcinoma.  He has had dysphagia for one month, with solid food mainly, he is able to keep liquid and soft diet down. He lost about 30 lbs in the the past month, but his weight has been stable in the past week since he started taking boost. He had some chest pain when he swallows. He is constipated, on stool softener, no bleeding.  He otherwise feels fine. His energy level is fair, he functions very well, no limitation on his physical activities. No nausea, no other pain, cough or other, he drinks about 2 boost a day, soup,and some soft diet.  He was referred to gastroenterologist Dr. HBenson Norwayby his primary care physician. He underwent EGD which showed a circumferential, obstructing, fungating mass in the third esophagus, biopsy showed poorly differentiated squamous cell carcinoma. CT scan chest abdomen and pelvis showed 3 lung nodules, with the largest 1.2 cm in the right middle lobe next to the right atrium.  CURRENT THERAPY: second line chemo Carboplatin AUC 5 and paclitaxel 175 mg/m, every 3 weeks, started 08/09/2015   INTERIM HISTORY GTruemanreturns for follow up. He is  Doing well  Overall. He denies Significant pain, dysphasia, odynophagia, or chest pain. He has moderate fatigue after chemotherapy, recurrence well. No significant nausea, diarrhea, neuropathy, or other new symptoms. His appetite is decent, his weight is  stable.  MEDICAL HISTORY:  Past Medical History  Diagnosis Date  . Coronary artery disease   . Shortness of breath   . Seizures ([place])   . Hypertension    . Pneumonia   . COPD (chronic obstructive pulmonary disease) ([place])   . Stroke ([place])   . Esophageal cancer ([place])   . Radiation 01/25/15-02/13/15    35 gray for esophageal cancer    SURGICAL HISTORY: Past Surgical History  Procedure Laterality Date  . Cardiac catheterization    . Coronary angioplasty with stent placement  07/22/2011  . Cardiac valve surgery    . Coronary artery bypass graft    . Lower extremity angiogram N/A 07/01/2011    Procedure: LOWER EXTREMITY ANGIOGRAM;  Surgeon: Laverda Page, MD;  Location: [facility] CATH LAB;  Service: Cardiovascular;  Laterality: N/A;  . Left heart catheterization with coronary angiogram N/A 07/01/2011    Procedure: LEFT HEART CATHETERIZATION WITH CORONARY ANGIOGRAM;  Surgeon: Laverda Page, MD;  Location: [facility] CATH LAB;  Service: Cardiovascular;  Laterality: N/A;  . Percutaneous coronary rotoblator intervention (pci-r) N/A 07/22/2011    Procedure: PERCUTANEOUS CORONARY ROTOBLATOR INTERVENTION (PCI-R);  Surgeon: Laverda Page, MD;  Location: [facility] CATH LAB;  Service: Cardiovascular;  Laterality: N/A;  . Left heart catheterization with coronary angiogram N/A 10/30/2011    Procedure: LEFT HEART CATHETERIZATION WITH CORONARY ANGIOGRAM;  Surgeon: Laverda Page, MD;  Location: [facility] CATH LAB;  Service: Cardiovascular;  Laterality: N/A;  . Esophagogastroduodenoscopy  01/02/15    SOCIAL HISTORY: Social History   Social History  . Marital Status: Married, lives with his wife     Spouse Name: N/A  . Number of Children: 19, 1 daughter and 3 sons, all lives in [place]   . Years of Education: N/A   Occupational History  . Retired Nature conservation officer    Social History Main Topics  . Smoking status: Current Every Day Smoker -- 0.50 packs/day for 50 years    Types: Cigarettes  . Smokeless tobacco: Never Used  . 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 Drug Use: No  . Sexual Activity: Not on file      Comment: did not ask   Other Topics Concern  . Not on file   Social History Narrative    FAMILY HISTORY: Family History  Problem Relation Age of Onset  . Heart disease Mother   . Hypertension Mother   . Heart disease Sister   . Hypertension Sister   . Heart disease Brother   . Hypertension Daughter     ALLERGIES:  has No Known Allergies.  MEDICATIONS:  Current Outpatient Prescriptions  Medication Sig Dispense Refill  . albuterol (PROVENTIL HFA;VENTOLIN HFA) 108 (90 BASE) MCG/ACT inhaler Inhale 1-2 puffs into the lungs every 6 (six) hours as needed for wheezing or shortness of breath. Reported on 05/17/2015    . aspirin EC 81 MG tablet Take 81 mg by mouth daily.    . feeding supplement (BOOST / RESOURCE BREEZE) LIQD Take 1 Container by mouth 3 (three) times daily between meals. (Patient taking differently: Take 1 Container by mouth 3 (three) times daily between meals. )  0  . HYDROcodone-acetaminophen (HYCET) 7.5-325 mg/15 ml solution Take 10 mLs by mouth every 8 (eight) hours as needed for moderate pain. 20 mL 0  . lisinopril (PRINIVIL) 10 MG tablet Take 1 tablet (10 mg total) by mouth daily. 30 tablet 1  .  magic mouthwash w/lidocaine SOLN Take 5 mLs by mouth 4 (four) times daily. 300 mL 0  . mirtazapine (REMERON) 15 MG tablet Take 15 mg by mouth at bedtime.     . phenytoin (DILANTIN) 100 MG ER capsule Take 2 caps twice a day 120 capsule 11  . pravastatin (PRAVACHOL) 40 MG tablet Take 40 mg by mouth every evening.     Marland Kitchen PRESCRIPTION MEDICATION He receives his chemo treatments at the [facility] with Dr. Burr Medico. He is on a 14 day cycle of FOLFOX.    Marland Kitchen prochlorperazine (COMPAZINE) 10 MG tablet Take 1 tablet (10 mg total) by mouth every 6 (six) hours as needed. 30 tablet 3  . terazosin (HYTRIN) 10 MG capsule Take 10 mg by mouth at bedtime.   0   No current facility-administered medications for this visit.    REVIEW OF SYSTEMS:   Constitutional: Denies  fevers, chills or abnormal night sweats, (+) weight loss  Eyes: Denies blurriness of vision, double vision or watery eyes Ears, nose, mouth, throat, and face: Denies mucositis or sore throat Respiratory: Denies cough, dyspnea or wheezes Cardiovascular: Denies palpitation, chest discomfort or lower extremity swelling Gastrointestinal:  (+) Dysphagia, mild heartburn, Denies nausea, vomiting or change in bowel habits Skin: Denies abnormal skin rashes Lymphatics: Denies new lymphadenopathy or easy bruising Neurological:Denies numbness, tingling or new weaknesses Behavioral/Psych: Mood is stable, no new changes  All other systems were reviewed with the patient and are negative.  PHYSICAL EXAMINATION: ECOG PERFORMANCE STATUS: 1 - Symptomatic but completely ambulatory  Filed Vitals:   10/11/15 0925  BP: 124/67  Pulse: 87  Temp: 98.2 F (36.8 C)  Resp: 18   Filed Weights   10/11/15 0925  Weight: 140 lb 8 oz (63.73 kg)    GENERAL:alert, no distress and comfortable SKIN: skin color, texture, turgor are normal, no rashes or significant lesions EYES: normal, conjunctiva are pink and non-injected, sclera clear OROPHARYNX:no exudate, no erythema and lips, buccal mucosa, and tongue normal  NECK: supple, thyroid normal size, non-tender, without nodularity LYMPH:  no palpable lymphadenopathy in the cervical, axillary or inguinal LUNGS: clear to auscultation and percussion with normal breathing effort HEART: regular rate & rhythm and no murmurs and no lower extremity edema ABDOMEN:abdomen soft, non-tender and normal bowel sounds Musculoskeletal:no cyanosis of digits and no clubbing  PSYCH: alert & oriented x 3 with fluent speech NEURO: no focal motor/sensory deficits  LABORATORY DATA:  I have reviewed the data as listed CBC Latest Ref Rng 10/11/2015 09/20/2015 08/30/2015  WBC 4.0 - 10.3 10e3/uL 4.9 4.3 4.7  Hemoglobin 13.0 - 17.1 g/dL 10.2(L) 9.5(L) 10.8(L)  Hematocrit 38.4 - 49.9 % 30.5(L)  27.9(L) 31.8(L)  Platelets 140 - 400 10e3/uL 177 144 131(L)    CMP Latest Ref Rng 10/11/2015 09/20/2015 08/30/2015  Glucose 70 - 140 mg/dl 150(H) 143(H) 116  BUN 7.0 - 26.0 mg/dL 10.8 10.6 8.7  Creatinine 0.7 - 1.3 mg/dL 0.9 0.7 0.8  Sodium 136 - 145 mEq/L 140 142 142  Potassium 3.5 - 5.1 mEq/L 3.9 3.7 3.5  CO2 22 - 29 mEq/L 27 27 30(H)  Calcium 8.4 - 10.4 mg/dL 9.3 8.9 8.8  Total Protein 6.4 - 8.3 g/dL 8.4(H) 7.9 8.1  Total Bilirubin 0.20 - 1.20 mg/dL 0.37 0.31 <0.30  Alkaline Phos 40 - 150 U/L 346(H) 351(H) 372(H)  AST 5 - 34 U/L _0 ALT 0 - 55 U/L 13 <9 <9   Pathology report  Diagnosis 01/26/2015 Bone, biopsy, r iliac - 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 Microscopic Comment The metastatic carcinoma is positive by immunohistochemistry with p63, cytokeratin 903 and cytokeratin 5/6. The tumor is negative with CD56, synaptophysin, chromogranin, thyroid transcription factor-1 and S100. (JDP:gt, 01/30/15)   RADIOGRAPHIC STUDIES: I have personally reviewed the radiological images as listed and agreed with the findings in the report.  PET 01/18/2015 IMPRESSION: 1. Previously described esophageal mass is diffusely hypermetabolic. Today's study demonstrates associated hypermetabolic subcarinal and possible right hilar lymphadenopathy, several pulmonary nodules in the lungs bilaterally (largest of which are hypermetabolic), and a hypermetabolic lesion in the superior aspect of the right ilium, compatible with metastatic disease. 2. Atherosclerosis, including left main and 3 vessel coronary artery disease. 3. Additional incidental findings, as above.  PET 05/29/2015 IMPRESSION: Mixed response to therapy, with marked decrease in hypermetabolic esophageal mass and resolution of mild mediastinal and right hilar lymphadenopathy.  Mild progression of bilateral pulmonary metastases.  Mild progression of bone metastases in the right ilium and right posterior  twelfth rib.  PET 10/04/2015 IMPRESSION: 1. Response to therapy. 2. Decrease in hypermetabolism of bilateral pulmonary nodules/metastasis. Resolution of left suprahilar hypermetabolism. 3. Decreased hypermetabolism within osseous metastasis, with increased sclerosis within the dominant right iliac wing mass. This is consistent with interval healing. 4. No new sites of disease. 5. Mild nonspecific residual mid esophageal hypermetabolism. 6. Prostatomegaly with probable bladder outlet obstruction.  ASSESSMENT & PLAN: 70 year old African-American male, with past medical history of hypertension, history of stroke, coronary artery disease, who presents with progressive dysphasia.  1. Distal esophagus squamous cell carcinoma, [id number] with lung and bone mets -I previously reviewed his EGD, CT and PET scan findings and the biopsy results in great details with patient and his daughter. -I discussed his right iliac bone biopsy results with patient and his son, unfortunately biopsy confirmed metastatic squamous cell carcinoma. -We reviewed the natural course of esophagitis cancer, and the incurable nature of his disease. We emphasized the goal of therapy is palliation and prolonged his life. -He has completed a short course of palliative radiation  - He is on second line chemotherapy carboplatin and Taxol, every 3 weeks -I reviewed the restaging PET scan images from 10/04/2015 with patient and his family members in person. His pulmonary and bone metastasis has overall improved, corresponding to partial response to chemotherapy. -He is clinically doing well, we'll continue carboplatin and Taxol. -Lab reviewed, adequate for treatment, we'll proceed to cycle 3 today.  2. Seizure -he had a seizure episode this morning. Resolved now. He is compliant with Dilantin. -I strongly encouraged him to follow-up with his urologist.  3. HTN, CAD, history of CVA -Follow up with primary care physician -We'll  watch his blood pressure closely when he is on chemotherapy, normal lately.  4. 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  -His mild anemia has been stable, no need for transfusion, we'll continue monitoring  5. Hyperglycemia -his BG has been in 200-300 range, no history of DM -I strongly encourage him to follow up with PCP, I will monitor his BG closely when on chemo    Plan -We reviewed his recent restaging PET scanning images - lab reviewed , mild anemia, adequate for treatment,  proceed 4thcycle  Carboplatinum and Taxol today - return to clinic in 3 weeks for next cycle.   All questions  were answered. The patient knows to call the clinic with any problems, questions or concerns.  I spent 25 minutes counseling the patient face to face. The total time spent in the appointment was 30 minutes and more than 50% was on counseling.     Truitt Merle, MD 10/11/2015

## 2015-10-11 NOTE — Patient Instructions (Signed)
[facility] Discharge Instructions for Patients Receiving Chemotherapy  Today you received the following chemotherapy agents Taxol and Carboplatin  To help prevent nausea and vomiting after your treatment, we encourage you to take your nausea medication     If you develop nausea and vomiting that is not controlled by your nausea medication, call the clinic.   BELOW ARE SYMPTOMS THAT SHOULD BE REPORTED IMMEDIATELY:  *FEVER GREATER THAN 100.5 F  *CHILLS WITH OR WITHOUT FEVER  NAUSEA AND VOMITING THAT IS NOT CONTROLLED WITH YOUR NAUSEA MEDICATION  *UNUSUAL SHORTNESS OF BREATH  *UNUSUAL BRUISING OR BLEEDING  TENDERNESS IN MOUTH AND THROAT WITH OR WITHOUT PRESENCE OF ULCERS  *URINARY PROBLEMS  *BOWEL PROBLEMS  UNUSUAL RASH Items with * indicate a potential emergency and should be followed up as soon as possible.  Feel free to call the clinic you have any questions or concerns. The clinic phone number is [phone].  Please show the CHEMO ALERT CARD at check-in to the Emergency Department and triage nurse.   

## 2015-10-20 ENCOUNTER — Emergency Department (HOSPITAL_COMMUNITY)
Admission: EM | Admit: 2015-10-20 | Discharge: 2015-10-21 | Disposition: A | Discharge status: Home / Self Care | Payer: Medicare Other | Attending: Emergency Medicine | Admitting: Emergency Medicine

## 2015-10-20 DIAGNOSIS — Z955 Presence of coronary angioplasty implant and graft: Secondary | ICD-10-CM | POA: Diagnosis not present

## 2015-10-20 DIAGNOSIS — J449 Chronic obstructive pulmonary disease, unspecified: Secondary | ICD-10-CM | POA: Diagnosis not present

## 2015-10-20 DIAGNOSIS — I10 Essential (primary) hypertension: Secondary | ICD-10-CM | POA: Insufficient documentation

## 2015-10-20 DIAGNOSIS — R569 Unspecified convulsions: Secondary | ICD-10-CM | POA: Diagnosis not present

## 2015-10-20 DIAGNOSIS — Z7982 Long term (current) use of aspirin: Secondary | ICD-10-CM | POA: Insufficient documentation

## 2015-10-20 DIAGNOSIS — Z87891 Personal history of nicotine dependence: Secondary | ICD-10-CM | POA: Diagnosis not present

## 2015-10-20 DIAGNOSIS — Z8673 Personal history of transient ischemic attack (TIA), and cerebral infarction without residual deficits: Secondary | ICD-10-CM | POA: Diagnosis not present

## 2015-10-20 DIAGNOSIS — I2510 Atherosclerotic heart disease of native coronary artery without angina pectoris: Secondary | ICD-10-CM | POA: Insufficient documentation

## 2015-10-20 DIAGNOSIS — G40909 Epilepsy, unspecified, not intractable, without status epilepticus: Secondary | ICD-10-CM | POA: Diagnosis not present

## 2015-10-20 DIAGNOSIS — Z8501 Personal history of malignant neoplasm of esophagus: Secondary | ICD-10-CM | POA: Diagnosis not present

## 2015-10-21 ENCOUNTER — Encounter (HOSPITAL_COMMUNITY): Payer: Self-pay | Admitting: Emergency Medicine

## 2015-10-21 LAB — CBG MONITORING, ED: Glucose-Capillary: 94 mg/dL (ref 65–99)

## 2015-10-21 LAB — PHENYTOIN LEVEL, TOTAL: PHENYTOIN LVL: 8.5 ug/mL — AB (ref 10.0–20.0)

## 2015-10-21 MED ORDER — PHENYTOIN SODIUM EXTENDED 100 MG PO CAPS
100.0000 mg | ORAL_CAPSULE | Freq: Once | ORAL | Status: DC
  Filled 2015-10-21: qty 1

## 2015-10-21 MED ORDER — PHENYTOIN SODIUM EXTENDED 100 MG PO CAPS
400.0000 mg | ORAL_CAPSULE | Freq: Once | ORAL | Status: AC
  Administered 2015-10-21: 400 mg via ORAL
  Filled 2015-10-21: qty 4

## 2015-10-21 MED ORDER — PHENYTOIN SODIUM EXTENDED 100 MG PO CAPS: ORAL_CAPSULE | ORAL | Status: DC

## 2015-10-21 NOTE — Discharge Instructions (Signed)

## 2015-10-21 NOTE — ED Provider Notes (Signed)
CSN: [id number]     Arrival date & time 10/20/15  2328 History  By signing my name below, I, Irene Pap, attest that this documentation has been prepared under the direction and in the presence of Ripley Fraise, MD. Electronically Signed: Irene Pap, ED Scribe. 10/21/2015. 2:26 AM.  Chief Complaint  Patient presents with  . Seizures   Patient is a 70 y.o. male presenting with seizures. The history is provided by the patient. No language interpreter was used.  Seizures Seizure activity on arrival: no   Seizure type:  Grand mal Preceding symptoms: no nausea and no numbness   Episode characteristics: no tongue biting   Postictal symptoms: no confusion, no memory loss and no somnolence   Return to baseline: yes   Severity:  Mild Timing:  Once Progression:  Unchanged Recent head injury:  No recent head injuries PTA treatment:  None History of seizures: yes   HPI Comments: Andre Guzman is a 70 y.o. male with a hx of CAD, hx of brain surgery, seizures, HTN, COPD, stroke, and esophageal cancer who presents to the Emergency Department complaining of a seizure onset 3 hours ago. Son states that pt had a grand mal seizure that lasted a few seconds. This was witnessed by the pt's wife. Pt does not have any complaints currently. Son reports that pt has been having increased amounts of seizures recently due to starting chemotherapy. He had a recent increase in Dilantin, because they have been having a hard time getting his Dilantin level to therapeutic levels. He denies recent head injuries, tongue biting, SOB, chest pain, nausea, vomiting, or abdominal pain.   Past Medical History  Diagnosis Date  . Coronary artery disease   . Shortness of breath   . Seizures ([place])   . Hypertension   . Pneumonia   . COPD (chronic obstructive pulmonary disease) ([place])   . Stroke ([place])   . Esophageal cancer ([place])   . Radiation 01/25/15-02/13/15    35 gray for esophageal cancer   Past Surgical History   Procedure Laterality Date  . Cardiac catheterization    . Coronary angioplasty with stent placement  07/22/2011  . Cardiac valve surgery    . Coronary artery bypass graft    . Lower extremity angiogram N/A 07/01/2011    Procedure: LOWER EXTREMITY ANGIOGRAM;  Surgeon: Laverda Page, MD;  Location: [facility] CATH LAB;  Service: Cardiovascular;  Laterality: N/A;  . Left heart catheterization with coronary angiogram N/A 07/01/2011    Procedure: LEFT HEART CATHETERIZATION WITH CORONARY ANGIOGRAM;  Surgeon: Laverda Page, MD;  Location: [facility] CATH LAB;  Service: Cardiovascular;  Laterality: N/A;  . Percutaneous coronary rotoblator intervention (pci-r) N/A 07/22/2011    Procedure: PERCUTANEOUS CORONARY ROTOBLATOR INTERVENTION (PCI-R);  Surgeon: Laverda Page, MD;  Location: [facility] CATH LAB;  Service: Cardiovascular;  Laterality: N/A;  . Left heart catheterization with coronary angiogram N/A 10/30/2011    Procedure: LEFT HEART CATHETERIZATION WITH CORONARY ANGIOGRAM;  Surgeon: Laverda Page, MD;  Location: [facility] CATH LAB;  Service: Cardiovascular;  Laterality: N/A;  . Esophagogastroduodenoscopy  01/02/15   Family History  Problem Relation Age of Onset  . Heart disease Mother   . Hypertension Mother   . Heart disease Sister   . Hypertension Sister   . Heart disease Brother   . Hypertension Daughter    Social History  Substance Use Topics  . Smoking status: Former Smoker -- 0.50 packs/day for 50 years    Types: Cigarettes  Quit date: 12/27/2014  . Smokeless tobacco: Never Used  . Alcohol Use: 0.0 oz/week    0 Standard drinks or equivalent per week     Comment: occasional beer, 1-2 per week, used to drink daily heavily     Review of Systems  Respiratory: Negative for shortness of breath.   Cardiovascular: Negative for chest pain.  Gastrointestinal: Negative for nausea, vomiting and abdominal pain.  Neurological: Positive for seizures.  All other systems reviewed and are  negative.  Allergies  Review of patient's allergies indicates no known allergies.  Home Medications   Prior to Admission medications   Medication Sig Start Date End Date Taking? Authorizing Provider  albuterol (PROVENTIL HFA;VENTOLIN HFA) 108 (90 BASE) MCG/ACT inhaler Inhale 1-2 puffs into the lungs every 6 (six) hours as needed for wheezing or shortness of breath. Reported on 05/17/2015    Historical Provider, MD  aspirin EC 81 MG tablet Take 81 mg by mouth daily.    Historical Provider, MD  feeding supplement (BOOST / RESOURCE BREEZE) LIQD Take 1 Container by mouth 3 (three) times daily between meals. Patient taking differently: Take 1 Container by mouth 3 (three) times daily between meals.  03/26/15   Venetia Maxon Rama, MD  HYDROcodone-acetaminophen (HYCET) 7.5-325 mg/15 ml solution Take 10 mLs by mouth every 8 (eight) hours as needed for moderate pain. 08/17/15   Truitt Merle, MD  lisinopril (PRINIVIL) 10 MG tablet Take 1 tablet (10 mg total) by mouth daily. 06/28/15   Truitt Merle, MD  magic mouthwash w/lidocaine SOLN Take 5 mLs by mouth 4 (four) times daily. 07/24/15   Truitt Merle, MD  mirtazapine (REMERON) 15 MG tablet Take 15 mg by mouth at bedtime.  03/14/15   Historical Provider, MD  phenytoin (DILANTIN) 100 MG ER capsule Take 2 caps twice a day 07/17/15   Cameron Sprang, MD  pravastatin (PRAVACHOL) 40 MG tablet Take 40 mg by mouth every evening.  01/08/15   Historical Provider, MD  PRESCRIPTION MEDICATION He receives his chemo treatments at the [facility] with Dr. Burr Medico. He is on a 14 day cycle of FOLFOX.    Historical Provider, MD  prochlorperazine (COMPAZINE) 10 MG tablet Take 1 tablet (10 mg total) by mouth every 6 (six) hours as needed. 08/09/15   Truitt Merle, MD  terazosin (HYTRIN) 10 MG capsule Take 10 mg by mouth at bedtime.  01/19/15   Historical Provider, MD   BP 153/82 mmHg  Pulse 92  Temp(Src) 99 F (37.2 C) (Oral)  Resp 20  Ht 5\' 8"  (1.727 m)  Wt 145 lb (65.772  kg)  BMI 22.05 kg/m2  SpO2 98% Physical Exam CONSTITUTIONAL: Well developed/well nourished HEAD: evidence of previous craniotomy, no tenderness, bruising or step-offs.  nontender to palpation EYES: EOMI/PERRL ENMT: Mucous membranes moist NECK: supple no meningeal signs SPINE/BACK:entire spine nontender CV: S1/S2 noted, no murmurs/rubs/gallops noted LUNGS: scattered wheezing noted ABDOMEN: soft, nontender GU:no cva tenderness NEURO: Pt is awake/alert/appropriate, moves all extremitiesx4.  No facial droop. Pt ambulatory without difficulty.    EXTREMITIES: pulses normal/equal, full ROM SKIN: warm, color normal PSYCH: no abnormalities of mood noted, alert and oriented to situation  ED Course  Procedures  DIAGNOSTIC STUDIES: Oxygen Saturation is 98% on RA, normal by my interpretation.    COORDINATION OF CARE: 2:15 AM-Discussed treatment plan which includes labs with pt at bedside and pt agreed to plan.  2:34 AM D/w dr Nicole Kindred with neuro We discussed recurrent seizures He recommends  oral dose of PTN here (400mg  PO) He recommends increasing to 500mg  total daily (200mg  QAM, 300mg  at night)   Pt stable No distress He admits he still smokes - advised to quit as he is wheezing Otherwise well appearing Given two week course of PTN for him to start but he must see neurology ASAP Pt is chronically ill and has h/o cancer but is at baseline, he feels improved and he is requesting d/c home  Labs Review Labs Reviewed  PHENYTOIN LEVEL, TOTAL - Abnormal; Notable for the following:    Phenytoin Lvl 8.5 (*)    All other components within normal limits  CBG MONITORING, ED    I have personally reviewed and evaluated these   lab results as part of my medical decision-making.   EKG Interpretation   Date/Time:  Sunday October 21 2015 00:04:38 EDT Ventricular Rate:  87 PR Interval:  131 QRS Duration: 102 QT Interval:  417 QTC Calculation: 502 R Axis:   65 Text Interpretation:  Sinus  rhythm Probable left atrial enlargement LVH  with secondary repolarization abnormality Prolonged QT interval artifact  noted Confirmed by Christy Gentles  MD, Elenore Rota ([id number]) on 10/21/2015 2:05:38 AM     Medications  phenytoin (DILANTIN) ER capsule 400 mg (400 mg Oral Given 10/21/15 0310)    MDM   Final diagnoses:  Seizure ([place])    Nursing notes including past medical history and social history reviewed and considered in documentation  I personally performed the services described in this documentation, which was scribed in my presence. The recorded information has been reviewed and is accurate.       Ripley Fraise, MD 10/21/15 [phone]

## 2015-10-21 NOTE — ED Notes (Signed)
Pt from home with his son following a seizure around 2230. Pt's son states he has them about every 3 weeks because of his chemotherapy- they have had a hard time keeping his dilantin level at a therapeutic level. Pt denies injuries during his seizure. Pt does not appear postictal at this time

## 2015-10-30 ENCOUNTER — Other Ambulatory Visit: Payer: Self-pay | Admitting: Hematology

## 2015-10-31 ENCOUNTER — Other Ambulatory Visit (HOSPITAL_BASED_OUTPATIENT_CLINIC_OR_DEPARTMENT_OTHER): Payer: Medicare Other

## 2015-10-31 ENCOUNTER — Ambulatory Visit (HOSPITAL_BASED_OUTPATIENT_CLINIC_OR_DEPARTMENT_OTHER): Payer: Medicare Other

## 2015-10-31 ENCOUNTER — Ambulatory Visit (HOSPITAL_BASED_OUTPATIENT_CLINIC_OR_DEPARTMENT_OTHER): Payer: Medicare Other | Admitting: Nurse Practitioner

## 2015-10-31 VITALS — BP 133/63 | HR 95 | Temp 98.7°F | Resp 18 | Ht 68.0 in | Wt 135.5 lb

## 2015-10-31 DIAGNOSIS — C7800 Secondary malignant neoplasm of unspecified lung: Secondary | ICD-10-CM | POA: Diagnosis not present

## 2015-10-31 DIAGNOSIS — C155 Malignant neoplasm of lower third of esophagus: Secondary | ICD-10-CM

## 2015-10-31 DIAGNOSIS — C7951 Secondary malignant neoplasm of bone: Secondary | ICD-10-CM

## 2015-10-31 DIAGNOSIS — C159 Malignant neoplasm of esophagus, unspecified: Secondary | ICD-10-CM

## 2015-10-31 DIAGNOSIS — I10 Essential (primary) hypertension: Secondary | ICD-10-CM

## 2015-10-31 DIAGNOSIS — Z95828 Presence of other vascular implants and grafts: Secondary | ICD-10-CM

## 2015-10-31 LAB — CBC & DIFF AND RETIC
BASO%: 0.2 % (ref 0.0–2.0)
Basophils Absolute: 0 10*3/uL (ref 0.0–0.1)
EOS%: 0.4 % (ref 0.0–7.0)
Eosinophils Absolute: 0 10*3/uL (ref 0.0–0.5)
HCT: 30.2 % — ABNORMAL LOW (ref 38.4–49.9)
HGB: 10.3 g/dL — ABNORMAL LOW (ref 13.0–17.1)
IMMATURE RETIC FRACT: 4.2 % (ref 3.00–10.60)
LYMPH#: 1 10*3/uL (ref 0.9–3.3)
LYMPH%: 18.6 % (ref 14.0–49.0)
MCH: 33.2 pg (ref 27.2–33.4)
MCHC: 34.1 g/dL (ref 32.0–36.0)
MCV: 97.4 fL (ref 79.3–98.0)
MONO#: 0.8 10*3/uL (ref 0.1–0.9)
MONO%: 13.6 % (ref 0.0–14.0)
NEUT%: 67.2 % (ref 39.0–75.0)
NEUTROS ABS: 3.8 10*3/uL (ref 1.5–6.5)
Platelets: 133 10*3/uL — ABNORMAL LOW (ref 140–400)
RBC: 3.1 10*6/uL — AB (ref 4.20–5.82)
RDW: 16 % — ABNORMAL HIGH (ref 11.0–14.6)
RETIC %: 1.19 % (ref 0.80–1.80)
RETIC CT ABS: 36.89 10*3/uL (ref 34.80–93.90)
WBC: 5.6 10*3/uL (ref 4.0–10.3)

## 2015-10-31 LAB — COMPREHENSIVE METABOLIC PANEL
ALBUMIN: 3 g/dL — AB (ref 3.5–5.0)
ALK PHOS: 350 U/L — AB (ref 40–150)
ALT: 12 U/L (ref 0–55)
ANION GAP: 10 meq/L (ref 3–11)
AST: 28 U/L (ref 5–34)
BILIRUBIN TOTAL: 0.32 mg/dL (ref 0.20–1.20)
BUN: 14 mg/dL (ref 7.0–26.0)
CALCIUM: 9 mg/dL (ref 8.4–10.4)
CO2: 28 mEq/L (ref 22–29)
CREATININE: 0.8 mg/dL (ref 0.7–1.3)
Chloride: 103 mEq/L (ref 98–109)
EGFR: >90 mL/min/{1.73_m2} (ref >90)
Glucose: 132 mg/dl (ref 70–140)
Potassium: 3.9 mEq/L (ref 3.5–5.1)
SODIUM: 141 meq/L (ref 136–145)
TOTAL PROTEIN: 8.5 g/dL — AB (ref 6.4–8.3)

## 2015-10-31 MED ORDER — HEPARIN SOD (PORK) LOCK FLUSH 100 UNIT/ML IV SOLN
500.0000 [IU] | Freq: Once | INTRAVENOUS | Status: AC
  Administered 2015-10-31: 500 [IU] via INTRAVENOUS
  Filled 2015-10-31: qty 5

## 2015-10-31 MED ORDER — SODIUM CHLORIDE 0.9% FLUSH
10.0000 mL | INTRAVENOUS | Status: DC | PRN
  Administered 2015-10-31: 10 mL via INTRAVENOUS
  Filled 2015-10-31: qty 10

## 2015-10-31 NOTE — Progress Notes (Signed)
[facility] OFFICE PROGRESS NOTE     Diagnosis: Esophageal cancer Oncology History   Squamous cell esophageal cancer  Staging form: Esophagus - Squamous Cell Carcinoma, AJCC 7th Edition  Clinical: Stage IV (TX, N0, M1) - Unsigned       Squamous cell esophageal cancer ([place])   01/02/2015 Initial Diagnosis Squamous cell esophageal cancer   01/02/2015 Procedure EGD by Dr. Benson Norway showed a large, fungating mass with no bleeding and with stigmata met of recent bleeding was found in the third of the esophagus. The mass was completely obstructing and circumferential. The stomach and examined duodenum were normal.   01/02/2015 Imaging CT CAP showed 9 cm segment of irregular his social worker thickening. Single enlarged subcarinal lymph node, bilateral pulmonary nodules (3), 14m right middle lobe next to right atrium, 3 RUL and a 7 mm lingular lung nodules.    01/02/2015 Initial Biopsy Esophageal mass biopsy showed poorly differentiated squamous cell carcinoma.   01/18/2015 Imaging PET scan showed hypermetabolic esophageal mass, subcarinal and possible right hilar adenopathy, several lung nodules, and a hypermetabolic bone lesion in right ilium, compatible with metastatic disease.    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           CURRENT THERAPY: second line chemo Carboplatin AUC 5 and paclitaxel 1 75 mg/m, every 3 weeks, started 08/09/2015   INTERVAL HISTORY:   [name] as scheduled. He completed cycle 4 carboplatin/Taxol 10/11/2015.  He denies nausea/vomiting. No mouth sores. No diarrhea or constipation. He has noted numbness/tingling in the fingertips consistently over the past week. He is having difficulty sitting his pants and tying his shoes. No dysphagia. Stable mild dyspnea on exertion. No pain.  Objective:  Vital signs in last 24 hours:  Blood pressure 133/63, pulse 95, temperature 98.7 F (37.1 C), temperature source Oral, resp. rate 18, height '5\' 8"'$  (1.727 m), weight 135 lb 8 oz (61.462 kg), SpO2 100 %.    HEENT: No thrush or ulcers. Resp: A few expiratory wheezes. No respiratory distress. Cardio: Regular rate and rhythm. GI: Abdomen soft and nontender. No hepatomegaly. No mass. Vascular: No leg edema. Neuro: Vibratory sense mildly to moderately diminished over the fingertips per tuning fork exam.  Port-A-Cath without erythema.   Lab Results:  Lab Results  Component Value Date   WBC 5.6 10/31/2015   HGB 10.3* 10/31/2015   HCT 30.2* 10/31/2015   MCV 97.4 10/31/2015   PLT 133* 10/31/2015   NEUTROABS 3.8 10/31/2015    Imaging:  No results found.  Medications: I have reviewed the patient's current medications.  Assessment/Plan: 1. Distal esophagus squamous cell carcinoma, [id number] with lung and bone mets; status post FOLFOX 02/22/2015 through 07/19/2015; restaging PET scan 07/30/2015 with evidence of progression; cycle 1 carboplatin/Taxol 08/09/2015, cycle 2 carboplatin/Taxol 08/30/2015, cycle 3 carboplatin/Taxol 09/20/2015; restaging PET scan 10/04/2015 showed improvement; cycle 4 carboplatin/Taxol 10/11/2015 2. Seizure. He continues Dilantin. 3. Hypertension, CAD, history of CVA 4. Hyperglycemia 5. Right hip pain. Resolved.   Disposition: Mr. MToepferappears stable. He has completed 4 cycles of carboplatin/Taxol. He has developed numbness/tingling in the fingertips interfering with activity. 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 I reviewed the above  with Dr. Benay Spice. We will hold the  Taxol with cycle 5 tomorrow. He will receive carboplatin alone.  He will return for a follow-up visit with Dr. Burr Medico on 11/22/2015. He will contact the office in the interim with any problems.    Ned Card ANP/GNP-BC   10/31/2015  2:07 PM

## 2015-10-31 NOTE — Patient Instructions (Signed)

## 2015-11-01 ENCOUNTER — Other Ambulatory Visit: Payer: Self-pay | Admitting: Oncology

## 2015-11-01 ENCOUNTER — Ambulatory Visit (HOSPITAL_BASED_OUTPATIENT_CLINIC_OR_DEPARTMENT_OTHER): Payer: Medicare Other

## 2015-11-01 DIAGNOSIS — C7800 Secondary malignant neoplasm of unspecified lung: Secondary | ICD-10-CM | POA: Diagnosis not present

## 2015-11-01 DIAGNOSIS — C7951 Secondary malignant neoplasm of bone: Secondary | ICD-10-CM

## 2015-11-01 DIAGNOSIS — C159 Malignant neoplasm of esophagus, unspecified: Secondary | ICD-10-CM

## 2015-11-01 DIAGNOSIS — Z5111 Encounter for antineoplastic chemotherapy: Secondary | ICD-10-CM

## 2015-11-01 DIAGNOSIS — C155 Malignant neoplasm of lower third of esophagus: Secondary | ICD-10-CM

## 2015-11-01 MED ORDER — HEPARIN SOD (PORK) LOCK FLUSH 100 UNIT/ML IV SOLN
500.0000 [IU] | Freq: Once | INTRAVENOUS | Status: AC | PRN
  Administered 2015-11-01: 500 [IU]
  Filled 2015-11-01: qty 5

## 2015-11-01 MED ORDER — SODIUM CHLORIDE 0.9 % IV SOLN
Freq: Once | INTRAVENOUS | Status: AC
  Administered 2015-11-01: 11:00:00 via INTRAVENOUS

## 2015-11-01 MED ORDER — PALONOSETRON HCL INJECTION 0.25 MG/5ML
0.2500 mg | Freq: Once | INTRAVENOUS | Status: AC
  Administered 2015-11-01: 0.25 mg via INTRAVENOUS

## 2015-11-01 MED ORDER — SODIUM CHLORIDE 0.9% FLUSH
10.0000 mL | INTRAVENOUS | Status: DC | PRN
  Administered 2015-11-01: 10 mL
  Filled 2015-11-01: qty 10

## 2015-11-01 MED ORDER — DEXAMETHASONE SODIUM PHOSPHATE 100 MG/10ML IJ SOLN
20.0000 mg | Freq: Once | INTRAMUSCULAR | Status: AC
  Administered 2015-11-01: 20 mg via INTRAVENOUS
  Filled 2015-11-01: qty 2

## 2015-11-01 MED ORDER — SODIUM CHLORIDE 0.9 % IV SOLN
423.0000 mg | Freq: Once | INTRAVENOUS | Status: AC
  Administered 2015-11-01: 420 mg via INTRAVENOUS
  Filled 2015-11-01: qty 42

## 2015-11-01 MED ORDER — PALONOSETRON HCL INJECTION 0.25 MG/5ML
INTRAVENOUS | Status: AC
  Filled 2015-11-01: qty 5

## 2015-11-22 ENCOUNTER — Telehealth: Payer: Self-pay | Admitting: *Deleted

## 2015-11-22 ENCOUNTER — Ambulatory Visit: Payer: Medicare Other

## 2015-11-22 ENCOUNTER — Encounter: Payer: Self-pay | Admitting: Hematology

## 2015-11-22 ENCOUNTER — Other Ambulatory Visit (HOSPITAL_BASED_OUTPATIENT_CLINIC_OR_DEPARTMENT_OTHER): Payer: Medicare Other

## 2015-11-22 ENCOUNTER — Ambulatory Visit (HOSPITAL_BASED_OUTPATIENT_CLINIC_OR_DEPARTMENT_OTHER): Payer: Medicare Other | Admitting: Hematology

## 2015-11-22 ENCOUNTER — Telehealth: Payer: Self-pay | Admitting: Hematology

## 2015-11-22 VITALS — BP 114/60 | HR 76 | Temp 98.2°F | Resp 18 | Ht 68.0 in | Wt 137.8 lb

## 2015-11-22 DIAGNOSIS — I10 Essential (primary) hypertension: Secondary | ICD-10-CM | POA: Diagnosis not present

## 2015-11-22 DIAGNOSIS — E46 Unspecified protein-calorie malnutrition: Secondary | ICD-10-CM | POA: Diagnosis not present

## 2015-11-22 DIAGNOSIS — C159 Malignant neoplasm of esophagus, unspecified: Secondary | ICD-10-CM

## 2015-11-22 DIAGNOSIS — D6481 Anemia due to antineoplastic chemotherapy: Secondary | ICD-10-CM

## 2015-11-22 DIAGNOSIS — C155 Malignant neoplasm of lower third of esophagus: Secondary | ICD-10-CM | POA: Diagnosis not present

## 2015-11-22 DIAGNOSIS — D649 Anemia, unspecified: Secondary | ICD-10-CM

## 2015-11-22 DIAGNOSIS — I2510 Atherosclerotic heart disease of native coronary artery without angina pectoris: Secondary | ICD-10-CM

## 2015-11-22 DIAGNOSIS — D630 Anemia in neoplastic disease: Secondary | ICD-10-CM

## 2015-11-22 DIAGNOSIS — G4089 Other seizures: Secondary | ICD-10-CM

## 2015-11-22 LAB — COMPREHENSIVE METABOLIC PANEL
ALT: 9 U/L (ref 0–55)
ANION GAP: 10 meq/L (ref 3–11)
AST: 26 U/L (ref 5–34)
Albumin: 2.8 g/dL — ABNORMAL LOW (ref 3.5–5.0)
Alkaline Phosphatase: 319 U/L — ABNORMAL HIGH (ref 40–150)
BUN: 15.6 mg/dL (ref 7.0–26.0)
CALCIUM: 8.6 mg/dL (ref 8.4–10.4)
CO2: 26 mEq/L (ref 22–29)
CREATININE: 0.8 mg/dL (ref 0.7–1.3)
Chloride: 103 mEq/L (ref 98–109)
EGFR: >90 mL/min/{1.73_m2} (ref >90)
Glucose: 244 mg/dl — ABNORMAL HIGH (ref 70–140)
Potassium: 3.8 mEq/L (ref 3.5–5.1)
Sodium: 139 mEq/L (ref 136–145)
TOTAL PROTEIN: 7.7 g/dL (ref 6.4–8.3)

## 2015-11-22 LAB — CBC & DIFF AND RETIC
BASO%: 0 % (ref 0.0–2.0)
Basophils Absolute: 0 10*3/uL (ref 0.0–0.1)
EOS ABS: 0 10*3/uL (ref 0.0–0.5)
EOS%: 0.3 % (ref 0.0–7.0)
HCT: 26.3 % — ABNORMAL LOW (ref 38.4–49.9)
HGB: 9.1 g/dL — ABNORMAL LOW (ref 13.0–17.1)
IMMATURE RETIC FRACT: 8.7 % (ref 3.00–10.60)
LYMPH#: 0.7 10*3/uL — AB (ref 0.9–3.3)
LYMPH%: 20.8 % (ref 14.0–49.0)
MCH: 32.9 pg (ref 27.2–33.4)
MCHC: 34.6 g/dL (ref 32.0–36.0)
MCV: 94.9 fL (ref 79.3–98.0)
MONO#: 0.4 10*3/uL (ref 0.1–0.9)
MONO%: 13.1 % (ref 0.0–14.0)
NEUT%: 65.8 % (ref 39.0–75.0)
NEUTROS ABS: 2.2 10*3/uL (ref 1.5–6.5)
NRBC: 0 % (ref 0–0)
Platelets: 58 10*3/uL — ABNORMAL LOW (ref 140–400)
RBC: 2.77 10*6/uL — AB (ref 4.20–5.82)
RDW: 14.8 % — AB (ref 11.0–14.6)
RETIC %: 0.74 % — AB (ref 0.80–1.80)
Retic Ct Abs: 20.5 10*3/uL — ABNORMAL LOW (ref 34.80–93.90)
WBC: 3.4 10*3/uL — ABNORMAL LOW (ref 4.0–10.3)

## 2015-11-22 LAB — IRON AND TIBC
%SAT: 64 % — ABNORMAL HIGH (ref 20–55)
Iron: 115 ug/dL (ref 42–163)
TIBC: 179 ug/dL — ABNORMAL LOW (ref 202–409)
UIBC: 64 ug/dL — AB (ref 117–376)

## 2015-11-22 LAB — FERRITIN: FERRITIN: 323 ng/mL — AB (ref 22–316)

## 2015-11-22 MED ORDER — SODIUM CHLORIDE 0.9 % IJ SOLN
10.0000 mL | Freq: Once | INTRAMUSCULAR | Status: AC
  Administered 2015-11-22: 10 mL
  Filled 2015-11-22: qty 10

## 2015-11-22 MED ORDER — HEPARIN SOD (PORK) LOCK FLUSH 100 UNIT/ML IV SOLN
500.0000 [IU] | Freq: Once | INTRAVENOUS | Status: AC
  Administered 2015-11-22: 500 [IU]
  Filled 2015-11-22: qty 5

## 2015-11-22 MED ORDER — SODIUM CHLORIDE 0.9 % IJ SOLN
10.0000 mL | INTRAMUSCULAR | Status: AC | PRN
  Administered 2015-11-22: 10 mL
  Filled 2015-11-22: qty 10

## 2015-11-22 NOTE — Telephone Encounter (Signed)
spoke w/ pt wife confirmed 7/13 & 8/11 apt times

## 2015-11-22 NOTE — Telephone Encounter (Signed)
Per staff message and POF I have scheduled appts. Advised scheduler of appts. JMW  

## 2015-11-22 NOTE — Progress Notes (Signed)
[place]  [phone] [phone]  Clinic Follow up Note   Patient Care Team: Glendale Chard, MD as PCP - General (Internal Medicine) Adrian Prows, MD as Consulting Physician (Cardiology) Jovita Gamma, MD as Consulting Physician (Neurosurgery) Lowella Bandy, MD as Consulting Physician (Urology) Grace Isaac, MD as Consulting Physician (Cardiothoracic Surgery) Truitt Merle, MD as Consulting Physician (Hematology) Tania Ade, RN as Registered Nurse Carol Ada, MD as Consulting Physician (Gastroenterology) Gery Pray, MD as Consulting Physician (Radiation Oncology) 11/22/2015  CHIEF COMPLAINTS:  Follow up esophageal squamous cell carcinoma  Oncology History   Squamous cell esophageal cancer   Staging form: Esophagus - Squamous Cell Carcinoma, AJCC 7th Edition     Clinical: Stage IV ([place], N0, M1) - Unsigned       Squamous cell esophageal cancer ([place])   01/02/2015 Initial Diagnosis Squamous cell esophageal cancer   01/02/2015 Procedure EGD by Dr. Benson Norway showed a large, fungating mass with no bleeding and with stigmata met of recent bleeding was found in the third of the esophagus. The mass was completely obstructing and circumferential. The stomach and examined duodenum were normal.   01/02/2015 Imaging CT CAP showed 9 cm segment of irregular his social worker thickening. Single enlarged subcarinal lymph node, bilateral pulmonary nodules (3), 37m right middle lobe next to right atrium, 3 RUL and a 7 mm lingular lung nodules.    01/02/2015 Initial Biopsy Esophageal mass biopsy showed poorly differentiated squamous cell carcinoma.   01/18/2015 Imaging PET scan showed hypermetabolic esophageal mass, subcarinal and possible right hilar adenopathy, several lung nodules, and a hypermetabolic bone lesion in right ilium, compatible with metastatic disease.    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   08/09/2015 -  Chemotherapy second line chemo Carboplatin AUC 5 and paclitaxel 1 75 mg/m, every 3 weeks   10/04/2015 Imaging PET scan showed interval response to therapy, decreased in hypermetabolism of bilateral pulmonary metastases and bone metastasis, no new lesions.    HISTORY OF PRESENTING ILLNESS:  Andre WADDINGTON70y.o. male is here because of recently diagnosed esophageal squamous cell carcinoma.  He has had dysphagia for one month, with solid food mainly, he is able to keep liquid and soft diet down. He lost about 30 lbs in the the past month, but his weight has been stable in the past week since he started taking boost. He had some chest pain when he swallows. He is constipated, on stool softener, no bleeding.  He otherwise feels fine. His energy level is fair, he functions very well, no limitation on his physical activities. No nausea, no other pain, cough or other, he drinks about 2 boost a day, soup,and some soft diet.  He was referred to gastroenterologist Dr. HBenson Norwayby his primary care physician. He underwent EGD which showed a circumferential, obstructing, fungating mass in the third esophagus, biopsy showed poorly differentiated squamous cell carcinoma. CT scan chest abdomen and pelvis showed 3 lung nodules, with the largest 1.2 cm in the right middle lobe next to the right atrium.  CURRENT THERAPY: second line chemo Carboplatin AUC 5 and paclitaxel 175 mg/m, every 3 weeks, started 08/09/2015   INTERIM HISTORY GIona Beard  returns for follow up and chemo. He is developed numbness on his hands about a month ago, worse on the right hand, mild tingling, no significant pain, he is able to button, but has  some difficulty to pick up a small since. No significant numbness or tingling on his feet. No other neurological symptoms. His Taxol was held 3 weeks ago due to the neuropathy, which has improved some lately. He otherwise feels well, with good appetite and energy level, eating well. Mild intermittent right hip pain, no other new symptoms.  MEDICAL HISTORY:  Past Medical History  Diagnosis Date  . Coronary artery disease   . Shortness of breath   . Seizures ([place])   . Hypertension   . Pneumonia   . COPD (chronic obstructive pulmonary disease) ([place])   . Stroke ([place])   . Esophageal cancer ([place])   . Radiation 01/25/15-02/13/15    35 gray for esophageal cancer    SURGICAL HISTORY: Past Surgical History  Procedure Laterality Date  . Cardiac catheterization    . Coronary angioplasty with stent placement  07/22/2011  . Cardiac valve surgery    . Coronary artery bypass graft    . Lower extremity angiogram N/A 07/01/2011    Procedure: LOWER EXTREMITY ANGIOGRAM;  Surgeon: Laverda Page, MD;  Location: [facility] CATH LAB;  Service: Cardiovascular;  Laterality: N/A;  . Left heart catheterization with coronary angiogram N/A 07/01/2011    Procedure: LEFT HEART CATHETERIZATION WITH CORONARY ANGIOGRAM;  Surgeon: Laverda Page, MD;  Location: [facility] CATH LAB;  Service: Cardiovascular;  Laterality: N/A;  . Percutaneous coronary rotoblator intervention (pci-r) N/A 07/22/2011    Procedure: PERCUTANEOUS CORONARY ROTOBLATOR INTERVENTION (PCI-R);  Surgeon: Laverda Page, MD;  Location: [facility] CATH LAB;  Service: Cardiovascular;  Laterality: N/A;  . Left heart catheterization with coronary angiogram N/A 10/30/2011    Procedure: LEFT HEART CATHETERIZATION WITH CORONARY ANGIOGRAM;  Surgeon: Laverda Page, MD;  Location: [facility] CATH LAB;  Service: Cardiovascular;  Laterality: N/A;  . Esophagogastroduodenoscopy  01/02/15    SOCIAL HISTORY: Social History   Social History  . Marital Status: Married, lives with his wife       Spouse Name: N/A  . Number of Children: 62, 1 daughter and 3 sons, all lives in [place]   . Years of Education: N/A   Occupational History  . Retired Nature conservation officer    Social History Main Topics  . Smoking status: Current Every Day Smoker -- 0.50 packs/day for 50 years    Types: Cigarettes  . Smokeless tobacco: Never Used  . 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 Drug Use: No  . Sexual Activity: Not on file     Comment: did not ask   Other Topics Concern  . Not on file   Social History Narrative    FAMILY HISTORY: Family History  Problem Relation Age of Onset  . Heart disease Mother   . Hypertension Mother   . Heart disease Sister   . Hypertension Sister   . Heart disease Brother   . Hypertension Daughter     ALLERGIES:  has No Known Allergies.  MEDICATIONS:  Current Outpatient Prescriptions  Medication Sig Dispense Refill  . albuterol (PROVENTIL HFA;VENTOLIN HFA) 108 (90 BASE) MCG/ACT inhaler Inhale 1-2 puffs into the lungs every 6 (six) hours as needed for wheezing or shortness of breath. Reported on 05/17/2015    . aspirin EC 81 MG  tablet Take 81 mg by mouth daily.    . [employer] MAGNESIUM 250 MG TABS Take 1 tablet by mouth daily.  0  . feeding supplement (BOOST / RESOURCE BREEZE) LIQD Take 1 Container by mouth 3 (three) times daily between meals. (Patient taking differently: Take 1 Container by mouth 3 (three) times daily between meals. )  0  . HYDROcodone-acetaminophen (HYCET) 7.5-325 mg/15 ml solution Take 10 mLs by mouth every 8 (eight) hours as needed for moderate pain. 20 mL 0  . lisinopril (PRINIVIL) 10 MG tablet Take 1 tablet (10 mg total) by mouth daily. 30 tablet 1  . magic mouthwash w/lidocaine SOLN Take 5 mLs by mouth 4 (four) times daily. 300 mL 0  . mirtazapine (REMERON) 15 MG tablet Take 15 mg by mouth at bedtime.     . phenytoin (DILANTIN) 100 MG ER capsule Take 2 caps PO every morning, take 3  caps PO every night 70 capsule 0  . pravastatin (PRAVACHOL) 40 MG tablet Take 40 mg by mouth every evening.     Marland Kitchen PRESCRIPTION MEDICATION He receives his chemo treatments at the [facility] at [facility] with Dr. Burr Medico. He is on a 14 day cycle of FOLFOX.    Marland Kitchen prochlorperazine (COMPAZINE) 10 MG tablet Take 1 tablet (10 mg total) by mouth every 6 (six) hours as needed. 30 tablet 3  . terazosin (HYTRIN) 10 MG capsule Take 10 mg by mouth at bedtime.   0   No current facility-administered medications for this visit.    REVIEW OF SYSTEMS:   Constitutional: Denies fevers, chills or abnormal night sweats, (+) weight loss  Eyes: Denies blurriness of vision, double vision or watery eyes Ears, nose, mouth, throat, and face: Denies mucositis or sore throat Respiratory: Denies cough, dyspnea or wheezes Cardiovascular: Denies palpitation, chest discomfort or lower extremity swelling Gastrointestinal:  (+) Dysphagia, mild heartburn, Denies nausea, vomiting or change in bowel habits Skin: Denies abnormal skin rashes Lymphatics: Denies new lymphadenopathy or easy bruising Neurological:Denies numbness, tingling or new weaknesses Behavioral/Psych: Mood is stable, no new changes  All other systems were reviewed with the patient and are negative.  PHYSICAL EXAMINATION: ECOG PERFORMANCE STATUS: 1 - Symptomatic but completely ambulatory  Filed Vitals:   11/22/15 1017  BP: 114/60  Pulse: 76  Temp: 98.2 F (36.8 C)  Resp: 18   Filed Weights   11/22/15 1017  Weight: 137 lb 12.8 oz (62.506 kg)    GENERAL:alert, no distress and comfortable SKIN: skin color, texture, turgor are normal, no rashes or significant lesions EYES: normal, conjunctiva are pink and non-injected, sclera clear OROPHARYNX:no exudate, no erythema and lips, buccal mucosa, and tongue normal  NECK: supple, thyroid normal size, non-tender, without nodularity LYMPH:  no palpable lymphadenopathy in the cervical, axillary or  inguinal LUNGS: clear to auscultation and percussion with normal breathing effort HEART: regular rate & rhythm and no murmurs and no lower extremity edema ABDOMEN:abdomen soft, non-tender and normal bowel sounds Musculoskeletal:no cyanosis of digits and no clubbing  PSYCH: alert & oriented x 3 with fluent speech NEURO: no focal motor/sensory deficits  LABORATORY DATA:  I have reviewed the data as listed CBC Latest Ref Rng 11/22/2015 10/31/2015 10/11/2015  WBC 4.0 - 10.3 10e3/uL 3.4(L) 5.6 4.9  Hemoglobin 13.0 - 17.1 g/dL 9.1(L) 10.3(L) 10.2(L)  Hematocrit 38.4 - 49.9 % 26.3(L) 30.2(L) 30.5(L)  Platelets 140 - 400 10e3/uL 58(L) 133(L) 177    CMP Latest Ref Rng 11/22/2015 10/31/2015 10/11/2015  Glucose 70 -  140 mg/dl 244(H) 132 150(H)  BUN 7.0 - 26.0 mg/dL 15.6 14.0 10.8  Creatinine 0.7 - 1.3 mg/dL 0.8 0.8 0.9  Sodium 136 - 145 mEq/L 139 141 140  Potassium 3.5 - 5.1 mEq/L 3.8 3.9 3.9  CO2 22 - 29 mEq/L _0 Calcium 8.4 - 10.4 mg/dL 8.6 9.0 9.3  Total Protein 6.4 - 8.3 g/dL 7.7 8.5(H) 8.4(H)  Total Bilirubin 0.20 - 1.20 mg/dL <0.30 0.32 0.37  Alkaline Phos 40 - 150 U/L 319(H) 350(H) 346(H)  AST 5 - 34 U/L _1 ALT 0 - 55 U/L _2 Pathology report  Diagnosis 01/26/2015 Bone, biopsy, r iliac - 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 Microscopic Comment The metastatic carcinoma is positive by immunohistochemistry with p63, cytokeratin 903 and cytokeratin 5/6. The tumor is negative with CD56, synaptophysin, chromogranin, thyroid transcription factor-1 and S100. (JDP:gt, 01/30/15)   RADIOGRAPHIC STUDIES: I have personally reviewed the radiological images as listed and agreed with the findings in the report.  PET 01/18/2015 IMPRESSION: 1. Previously described esophageal mass is diffusely hypermetabolic. Today's study demonstrates associated hypermetabolic subcarinal and possible right hilar lymphadenopathy, several pulmonary nodules in the lungs bilaterally  (largest of which are hypermetabolic), and a hypermetabolic lesion in the superior aspect of the right ilium, compatible with metastatic disease. 2. Atherosclerosis, including left main and 3 vessel coronary artery disease. 3. Additional incidental findings, as above.  PET 05/29/2015 IMPRESSION: Mixed response to therapy, with marked decrease in hypermetabolic esophageal mass and resolution of mild mediastinal and right hilar lymphadenopathy.  Mild progression of bilateral pulmonary metastases.  Mild progression of bone metastases in the right ilium and right posterior twelfth rib.  PET 10/04/2015 IMPRESSION: 1. Response to therapy. 2. Decrease in hypermetabolism of bilateral pulmonary nodules/metastasis. Resolution of left suprahilar hypermetabolism. 3. Decreased hypermetabolism within osseous metastasis, with increased sclerosis within the dominant right iliac wing mass. This is consistent with interval healing. 4. No new sites of disease. 5. Mild nonspecific residual mid esophageal hypermetabolism. 6. Prostatomegaly with probable bladder outlet obstruction.  ASSESSMENT & PLAN: 70 year old African-American male, with past medical history of hypertension, history of stroke, coronary artery disease, who presents with progressive dysphasia.  1. Distal esophagus squamous cell carcinoma, [id number] with lung and bone mets -I previously reviewed his EGD, CT and PET scan findings and the biopsy results in great details with patient and his daughter. -I discussed his right iliac bone biopsy results with patient and his son, unfortunately biopsy confirmed metastatic squamous cell carcinoma. -We reviewed the natural course of esophagitis cancer, and the incurable nature of his disease. We emphasized the goal of therapy is palliation and prolonged his life. -He has completed a short course of palliative radiation  - He is on second line chemotherapy carboplatin and Taxol, every 3 weeks -I  reviewed the restaging PET scan images from 10/04/2015 with patient and his family members in person. His pulmonary and bone metastasis has overall improved, corresponding to partial response to chemotherapy. 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 continue carboplatin and Taxol. -Repeat PET scan after next cycle chemotherapy. -If there is no disease progression on next scan, I plan to hold chemo after next cycle, and observe him. He understands this is incurable disease, and he may need restarted chemotherapy  in the future.  2. Seizure -he had a seizure episode this morning. Resolved now. He is compliant with Dilantin. -I strongly encouraged him to follow-up with his urologist.  3. HTN, CAD, history of CVA -Follow up with primary care physician -We'll watch his blood pressure closely when he is on chemotherapy, normal lately.  4. 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  -His mild anemia has been stable, no need for transfusion, we'll continue monitoring  5. Hyperglycemia -his BG has been in 200-300 range, no history of DM -I strongly encourage him to follow up with PCP, I will monitor his BG closely when on chemo   6. Peripheral neuropathy, G1-2 -Slightly improved since we held Taxol last cycle. Mainly numbness, and mild tingling, no pain -We'll continue observe closely. -We'll consider Neurontin if his neuropathy gets worse, especially tingling and pain   Plan -Due to the moderate thrombocytopenia, I'll postpone his chemotherapy to next week. Decrease carbo to AUC 4.5 due to thrombocytopenia, and restart Taxol at 167m/m2    -I will see him back in 5 weeks with a restaging PET scan on week before.  All questions were answered. The patient knows to call the clinic with any problems, questions or concerns.  I spent 25 minutes counseling the patient face to face. The total time spent in the appointment was 30 minutes and more than 50% was on counseling.     FTruitt Merle MD 11/22/2015

## 2015-11-22 NOTE — Patient Instructions (Signed)

## 2015-11-27 DIAGNOSIS — R7309 Other abnormal glucose: Secondary | ICD-10-CM | POA: Diagnosis not present

## 2015-11-27 DIAGNOSIS — C799 Secondary malignant neoplasm of unspecified site: Secondary | ICD-10-CM | POA: Diagnosis not present

## 2015-11-27 DIAGNOSIS — C159 Malignant neoplasm of esophagus, unspecified: Secondary | ICD-10-CM | POA: Diagnosis not present

## 2015-11-27 DIAGNOSIS — D72819 Decreased white blood cell count, unspecified: Secondary | ICD-10-CM | POA: Diagnosis not present

## 2015-11-29 ENCOUNTER — Other Ambulatory Visit (HOSPITAL_BASED_OUTPATIENT_CLINIC_OR_DEPARTMENT_OTHER): Payer: Medicare Other

## 2015-11-29 ENCOUNTER — Telehealth: Payer: Self-pay | Admitting: *Deleted

## 2015-11-29 ENCOUNTER — Other Ambulatory Visit: Payer: Self-pay | Admitting: *Deleted

## 2015-11-29 ENCOUNTER — Ambulatory Visit (HOSPITAL_BASED_OUTPATIENT_CLINIC_OR_DEPARTMENT_OTHER): Payer: Medicare Other

## 2015-11-29 VITALS — BP 128/76 | HR 84 | Temp 98.2°F | Resp 16

## 2015-11-29 DIAGNOSIS — C155 Malignant neoplasm of lower third of esophagus: Secondary | ICD-10-CM | POA: Diagnosis not present

## 2015-11-29 DIAGNOSIS — C159 Malignant neoplasm of esophagus, unspecified: Secondary | ICD-10-CM

## 2015-11-29 LAB — COMPREHENSIVE METABOLIC PANEL
ALBUMIN: 2.8 g/dL — AB (ref 3.5–5.0)
ALK PHOS: 323 U/L — AB (ref 40–150)
ALT: <9 U/L (ref 0–55)
AST: 29 U/L (ref 5–34)
Anion Gap: 13 mEq/L — ABNORMAL HIGH (ref 3–11)
BUN: 11.5 mg/dL (ref 7.0–26.0)
CO2: 23 mEq/L (ref 22–29)
Calcium: 8.8 mg/dL (ref 8.4–10.4)
Chloride: 103 mEq/L (ref 98–109)
Creatinine: 0.8 mg/dL (ref 0.7–1.3)
GLUCOSE: 249 mg/dL — AB (ref 70–140)
Potassium: 3.9 mEq/L (ref 3.5–5.1)
SODIUM: 138 meq/L (ref 136–145)
TOTAL PROTEIN: 8 g/dL (ref 6.4–8.3)

## 2015-11-29 LAB — CBC & DIFF AND RETIC
BASO%: 0 % (ref 0.0–2.0)
Basophils Absolute: 0 10*3/uL (ref 0.0–0.1)
EOS%: 0.6 % (ref 0.0–7.0)
Eosinophils Absolute: 0 10*3/uL (ref 0.0–0.5)
HCT: 27.5 % — ABNORMAL LOW (ref 38.4–49.9)
HGB: 9.4 g/dL — ABNORMAL LOW (ref 13.0–17.1)
IMMATURE RETIC FRACT: 11.1 % — AB (ref 3.00–10.60)
LYMPH#: 0.7 10*3/uL — AB (ref 0.9–3.3)
LYMPH%: 20 % (ref 14.0–49.0)
MCH: 32.6 pg (ref 27.2–33.4)
MCHC: 34.2 g/dL (ref 32.0–36.0)
MCV: 95.5 fL (ref 79.3–98.0)
MONO#: 0.3 10*3/uL (ref 0.1–0.9)
MONO%: 7.7 % (ref 0.0–14.0)
NEUT%: 71.7 % (ref 39.0–75.0)
NEUTROS ABS: 2.5 10*3/uL (ref 1.5–6.5)
Platelets: 65 10*3/uL — ABNORMAL LOW (ref 140–400)
RBC: 2.88 10*6/uL — AB (ref 4.20–5.82)
RDW: 15 % — AB (ref 11.0–14.6)
RETIC %: 0.62 % — AB (ref 0.80–1.80)
RETIC CT ABS: 17.86 10*3/uL — AB (ref 34.80–93.90)
WBC: 3.5 10*3/uL — AB (ref 4.0–10.3)

## 2015-11-29 MED ORDER — HEPARIN SOD (PORK) LOCK FLUSH 100 UNIT/ML IV SOLN
500.0000 [IU] | Freq: Once | INTRAVENOUS | Status: AC
  Administered 2015-11-29: 500 [IU]
  Filled 2015-11-29: qty 5

## 2015-11-29 MED ORDER — SODIUM CHLORIDE 0.9 % IJ SOLN
10.0000 mL | Freq: Once | INTRAMUSCULAR | Status: AC
  Administered 2015-11-29: 10 mL
  Filled 2015-11-29: qty 10

## 2015-11-29 NOTE — Telephone Encounter (Signed)
Per POF and chemo RN I have scheduled appts. Chemo RN to give schedule to [patient

## 2015-11-29 NOTE — Progress Notes (Signed)
Dr. Burr Medico reviewed lab results today.  No chemo today due to low platelets 65.  POF sent to scheduler for reschedule lab, office visit , and chemo on 12/10/15.  Explanations given to pt, and appt calendar printed for pt.   Pt voiced understanding.

## 2015-12-10 ENCOUNTER — Ambulatory Visit (HOSPITAL_BASED_OUTPATIENT_CLINIC_OR_DEPARTMENT_OTHER): Payer: Medicare Other | Admitting: Hematology

## 2015-12-10 ENCOUNTER — Encounter: Payer: Self-pay | Admitting: Hematology

## 2015-12-10 ENCOUNTER — Ambulatory Visit (HOSPITAL_BASED_OUTPATIENT_CLINIC_OR_DEPARTMENT_OTHER): Payer: Medicare Other

## 2015-12-10 ENCOUNTER — Other Ambulatory Visit (HOSPITAL_BASED_OUTPATIENT_CLINIC_OR_DEPARTMENT_OTHER): Payer: Medicare Other

## 2015-12-10 VITALS — BP 110/60 | HR 95 | Temp 98.2°F | Resp 18 | Ht 68.0 in | Wt 133.4 lb

## 2015-12-10 DIAGNOSIS — Z5111 Encounter for antineoplastic chemotherapy: Secondary | ICD-10-CM

## 2015-12-10 DIAGNOSIS — D6481 Anemia due to antineoplastic chemotherapy: Secondary | ICD-10-CM | POA: Diagnosis not present

## 2015-12-10 DIAGNOSIS — C159 Malignant neoplasm of esophagus, unspecified: Secondary | ICD-10-CM

## 2015-12-10 DIAGNOSIS — C155 Malignant neoplasm of lower third of esophagus: Secondary | ICD-10-CM

## 2015-12-10 DIAGNOSIS — E46 Unspecified protein-calorie malnutrition: Secondary | ICD-10-CM

## 2015-12-10 DIAGNOSIS — I10 Essential (primary) hypertension: Secondary | ICD-10-CM

## 2015-12-10 DIAGNOSIS — C7951 Secondary malignant neoplasm of bone: Secondary | ICD-10-CM | POA: Diagnosis not present

## 2015-12-10 DIAGNOSIS — I2510 Atherosclerotic heart disease of native coronary artery without angina pectoris: Secondary | ICD-10-CM

## 2015-12-10 DIAGNOSIS — D630 Anemia in neoplastic disease: Secondary | ICD-10-CM

## 2015-12-10 LAB — CBC & DIFF AND RETIC
BASO%: 0 % (ref 0.0–2.0)
Basophils Absolute: 0 10*3/uL (ref 0.0–0.1)
EOS ABS: 0 10*3/uL (ref 0.0–0.5)
EOS%: 0.9 % (ref 0.0–7.0)
HCT: 28.7 % — ABNORMAL LOW (ref 38.4–49.9)
HEMOGLOBIN: 9.8 g/dL — AB (ref 13.0–17.1)
IMMATURE RETIC FRACT: 12.5 % — AB (ref 3.00–10.60)
LYMPH#: 0.8 10*3/uL — AB (ref 0.9–3.3)
LYMPH%: 23.5 % (ref 14.0–49.0)
MCH: 32.5 pg (ref 27.2–33.4)
MCHC: 34.1 g/dL (ref 32.0–36.0)
MCV: 95 fL (ref 79.3–98.0)
MONO#: 0.4 10*3/uL (ref 0.1–0.9)
MONO%: 12.4 % (ref 0.0–14.0)
NEUT%: 63.2 % (ref 39.0–75.0)
NEUTROS ABS: 2.2 10*3/uL (ref 1.5–6.5)
Platelets: 132 10*3/uL — ABNORMAL LOW (ref 140–400)
RBC: 3.02 10*6/uL — ABNORMAL LOW (ref 4.20–5.82)
RDW: 15.4 % — AB (ref 11.0–14.6)
RETIC %: 1.23 % (ref 0.80–1.80)
RETIC CT ABS: 37.15 10*3/uL (ref 34.80–93.90)
WBC: 3.4 10*3/uL — AB (ref 4.0–10.3)

## 2015-12-10 LAB — COMPREHENSIVE METABOLIC PANEL
ALBUMIN: 3.1 g/dL — AB (ref 3.5–5.0)
ALK PHOS: 362 U/L — AB (ref 40–150)
ALT: 14 U/L (ref 0–55)
AST: 36 U/L — AB (ref 5–34)
Anion Gap: 10 mEq/L (ref 3–11)
BUN: 19.7 mg/dL (ref 7.0–26.0)
CO2: 26 mEq/L (ref 22–29)
CREATININE: 0.9 mg/dL (ref 0.7–1.3)
Calcium: 9.4 mg/dL (ref 8.4–10.4)
Chloride: 103 mEq/L (ref 98–109)
EGFR: >90 mL/min/{1.73_m2} (ref >90)
GLUCOSE: 167 mg/dL — AB (ref 70–140)
POTASSIUM: 4.4 meq/L (ref 3.5–5.1)
SODIUM: 140 meq/L (ref 136–145)
Total Bilirubin: <0.3 mg/dL (ref 0.20–1.20)
Total Protein: 8.6 g/dL — ABNORMAL HIGH (ref 6.4–8.3)

## 2015-12-10 MED ORDER — FAMOTIDINE IN NACL 20-0.9 MG/50ML-% IV SOLN
20.0000 mg | Freq: Once | INTRAVENOUS | Status: AC
  Administered 2015-12-10: 20 mg via INTRAVENOUS

## 2015-12-10 MED ORDER — PALONOSETRON HCL INJECTION 0.25 MG/5ML
0.2500 mg | Freq: Once | INTRAVENOUS | Status: AC
  Administered 2015-12-10: 0.25 mg via INTRAVENOUS

## 2015-12-10 MED ORDER — PALONOSETRON HCL INJECTION 0.25 MG/5ML
INTRAVENOUS | Status: AC
  Filled 2015-12-10: qty 5

## 2015-12-10 MED ORDER — FAMOTIDINE IN NACL 20-0.9 MG/50ML-% IV SOLN
INTRAVENOUS | Status: AC
  Filled 2015-12-10: qty 50

## 2015-12-10 MED ORDER — DIPHENHYDRAMINE HCL 50 MG/ML IJ SOLN
INTRAMUSCULAR | Status: AC
  Filled 2015-12-10: qty 1

## 2015-12-10 MED ORDER — HEPARIN SOD (PORK) LOCK FLUSH 100 UNIT/ML IV SOLN
500.0000 [IU] | Freq: Once | INTRAVENOUS | Status: AC | PRN
  Administered 2015-12-10: 500 [IU]
  Filled 2015-12-10: qty 5

## 2015-12-10 MED ORDER — SODIUM CHLORIDE 0.9 % IV SOLN
296.1000 mg | Freq: Once | INTRAVENOUS | Status: AC
  Administered 2015-12-10: 300 mg via INTRAVENOUS
  Filled 2015-12-10: qty 30

## 2015-12-10 MED ORDER — DEXAMETHASONE SODIUM PHOSPHATE 100 MG/10ML IJ SOLN
20.0000 mg | Freq: Once | INTRAMUSCULAR | Status: AC
  Administered 2015-12-10: 20 mg via INTRAVENOUS
  Filled 2015-12-10: qty 2

## 2015-12-10 MED ORDER — SODIUM CHLORIDE 0.9% FLUSH
10.0000 mL | INTRAVENOUS | Status: DC | PRN
  Administered 2015-12-10: 10 mL
  Filled 2015-12-10: qty 10

## 2015-12-10 MED ORDER — DIPHENHYDRAMINE HCL 50 MG/ML IJ SOLN
50.0000 mg | Freq: Once | INTRAMUSCULAR | Status: AC
  Administered 2015-12-10: 50 mg via INTRAVENOUS

## 2015-12-10 MED ORDER — PACLITAXEL CHEMO INJECTION 300 MG/50ML
100.0000 mg/m2 | Freq: Once | INTRAVENOUS | Status: AC
  Administered 2015-12-10: 174 mg via INTRAVENOUS
  Filled 2015-12-10: qty 29

## 2015-12-10 MED ORDER — SODIUM CHLORIDE 0.9 % IV SOLN
Freq: Once | INTRAVENOUS | Status: AC
  Administered 2015-12-10: 12:00:00 via INTRAVENOUS

## 2015-12-10 NOTE — Patient Instructions (Signed)
[place] Discharge Instructions for Patients Receiving Chemotherapy  Today you received the following chemotherapy agents Taxol and Carboplatin.  To help prevent nausea and vomiting after your treatment, we encourage you to take your nausea medication. No Zofran for 3 days. Take Compazine instead.   If you develop nausea and vomiting that is not controlled by your nausea medication, call the clinic.   BELOW ARE SYMPTOMS THAT SHOULD BE REPORTED IMMEDIATELY:  *FEVER GREATER THAN 100.5 F  *CHILLS WITH OR WITHOUT FEVER  NAUSEA AND VOMITING THAT IS NOT CONTROLLED WITH YOUR NAUSEA MEDICATION  *UNUSUAL SHORTNESS OF BREATH  *UNUSUAL BRUISING OR BLEEDING  TENDERNESS IN MOUTH AND THROAT WITH OR WITHOUT PRESENCE OF ULCERS  *URINARY PROBLEMS  *BOWEL PROBLEMS  UNUSUAL RASH Items with * indicate a potential emergency and should be followed up as soon as possible.  Feel free to call the clinic you have any questions or concerns. The clinic phone number is [phone].  Please show the [place] at check-in to the Emergency Department and triage nurse.

## 2015-12-10 NOTE — Progress Notes (Signed)
[place]  [phone] [phone]  Clinic Follow up Note   Patient Care Team: Glendale Chard, MD as PCP - General (Internal Medicine) Adrian Prows, MD as Consulting Physician (Cardiology) Jovita Gamma, MD as Consulting Physician (Neurosurgery) Lowella Bandy, MD as Consulting Physician (Urology) Grace Isaac, MD as Consulting Physician (Cardiothoracic Surgery) Truitt Merle, MD as Consulting Physician (Hematology) Tania Ade, RN as Registered Nurse Carol Ada, MD as Consulting Physician (Gastroenterology) Gery Pray, MD as Consulting Physician (Radiation Oncology) 12/10/2015  CHIEF COMPLAINTS:  Follow up esophageal squamous cell carcinoma  Oncology History   Squamous cell esophageal cancer   Staging form: Esophagus - Squamous Cell Carcinoma, AJCC 7th Edition     Clinical: Stage IV ([place], N0, M1) - Unsigned       Squamous cell esophageal cancer ([place])   01/02/2015 Initial Diagnosis    Squamous cell esophageal cancer     01/02/2015 Procedure    EGD by Dr. Benson Norway showed a large, fungating mass with no bleeding and with stigmata met of recent bleeding was found in the third of the esophagus. The mass was completely obstructing and circumferential. The stomach and examined duodenum were normal.     01/02/2015 Imaging    CT CAP showed 9 cm segment of irregular his social worker thickening. Single enlarged subcarinal lymph node, bilateral pulmonary nodules (3), 68m right middle lobe next to right atrium, 3 RUL and a 7 mm lingular lung nodules.      01/02/2015 Initial Biopsy    Esophageal mass biopsy showed poorly differentiated squamous cell carcinoma.     01/18/2015 Imaging    PET scan showed hypermetabolic esophageal mass, subcarinal and possible right hilar adenopathy, several lung nodules, and a hypermetabolic bone lesion in right ilium, compatible with metastatic disease.      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     08/09/2015 -  Chemotherapy    second line chemo Carboplatin AUC 5 and paclitaxel 1 75 mg/m, every 3 weeks     10/04/2015 Imaging    PET scan showed interval response to therapy, decreased in hypermetabolism of bilateral pulmonary metastases and bone metastasis, no new lesions.      HISTORY OF PRESENTING ILLNESS:  GLENIN [id number]. male is here because of recently diagnosed esophageal squamous cell carcinoma.  He has had dysphagia for one month, with solid food mainly, he is able to keep liquid and soft diet down. He lost about 30 lbs in the the past month, but his weight has been stable in the past week since he started taking boost. He had some chest pain when he swallows. He is constipated, on stool softener, no bleeding.  He otherwise feels fine. His energy level is fair, he functions very well, no limitation on his physical activities. No nausea, no other pain, cough or other, he drinks about 2 boost a day, soup,and some soft diet.  He was referred to gastroenterologist Dr. HBenson Norwayby his primary care physician. He underwent EGD which showed a circumferential, obstructing, fungating mass in the third esophagus,  biopsy showed poorly differentiated squamous cell carcinoma. CT scan chest abdomen and pelvis showed 3 lung nodules, with the largest 1.2 cm in the right middle lobe next to the right atrium.  CURRENT THERAPY: second line chemo Carboplatin AUC 5 and paclitaxel 175 mg/m, every 3 weeks, started 08/09/2015, dose reduced from cycle 4 due to thrombocytopenia and neuropathy  INTERIM HISTORY Kruz returns for follow up and chemo.  His chemotherapy was held for the last 2 weeks due to use moderate thrombocytopenia. He returns today for cycle 5. He reports newly developed right hip pain, especially when he he gets up and starts walking, it does improve after he works for a while. He is limping a little, does not need a cane. The pain is mild to moderate, tolerable, is not taking pain medication. No other new pain. His numbness on his fingers is stable, no significant tingling or pain, his hand function is mainly preserved, no other new complains, weight stable, no significant dysplasia.  MEDICAL HISTORY:  Past Medical History:  Diagnosis Date  . COPD (chronic obstructive pulmonary disease) ([place])   . Coronary artery disease   . Esophageal cancer ([place])   . Hypertension   . Pneumonia   . Radiation 01/25/15-02/13/15   35 gray for esophageal cancer  . Seizures ([place])   . Shortness of breath   . Stroke [facility])     SURGICAL HISTORY: Past Surgical History:  Procedure Laterality Date  . CARDIAC CATHETERIZATION    . CARDIAC VALVE SURGERY    . CORONARY ANGIOPLASTY WITH STENT PLACEMENT  07/22/2011  . CORONARY ARTERY BYPASS GRAFT    . ESOPHAGOGASTRODUODENOSCOPY  01/02/15  . LEFT HEART CATHETERIZATION WITH CORONARY ANGIOGRAM N/A 07/01/2011   Procedure: LEFT HEART CATHETERIZATION WITH CORONARY ANGIOGRAM;  Surgeon: Laverda Page, MD;  Location: [facility] CATH LAB;  Service: Cardiovascular;  Laterality: N/A;  . LEFT HEART CATHETERIZATION WITH CORONARY ANGIOGRAM N/A 10/30/2011   Procedure: LEFT HEART CATHETERIZATION WITH CORONARY ANGIOGRAM;  Surgeon: Laverda Page, MD;  Location: [facility] CATH LAB;  Service: Cardiovascular;  Laterality: N/A;  . LOWER EXTREMITY ANGIOGRAM N/A 07/01/2011   Procedure: LOWER EXTREMITY ANGIOGRAM;  Surgeon: Laverda Page, MD;  Location: [facility] CATH LAB;  Service: Cardiovascular;  Laterality: N/A;  . PERCUTANEOUS CORONARY ROTOBLATOR INTERVENTION (PCI-R) N/A 07/22/2011   Procedure: PERCUTANEOUS CORONARY ROTOBLATOR  INTERVENTION (PCI-R);  Surgeon: Laverda Page, MD;  Location: [facility] CATH LAB;  Service: Cardiovascular;  Laterality: N/A;    SOCIAL HISTORY: Social History   Social History  . Marital Status: Married, lives with his wife     Spouse Name: N/A  . Number of Children: 32, 1 daughter and 3 sons, all lives in [place]   . Years of Education: N/A   Occupational History  . Retired Nature conservation officer    Social History Main Topics  . Smoking status: Current Every Day Smoker -- 0.50 packs/day for 50 years    Types: Cigarettes  . Smokeless tobacco: Never Used  . 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 Drug Use: No  . Sexual Activity: Not on file     Comment: did not ask   Other Topics Concern  . Not on file   Social History Narrative    FAMILY HISTORY: Family History  Problem Relation Age of Onset  . Heart disease Mother   . Hypertension Mother   . Heart disease Sister   . Hypertension Sister   .  Heart disease Brother   . Hypertension Daughter     ALLERGIES:  has No Known Allergies.  MEDICATIONS:  Current Outpatient Prescriptions  Medication Sig Dispense Refill  . albuterol (PROVENTIL HFA;VENTOLIN HFA) 108 (90 BASE) MCG/ACT inhaler Inhale 1-2 puffs into the lungs every 6 (six) hours as needed for wheezing or shortness of breath. Reported on 05/17/2015    . aspirin EC 81 MG tablet Take 81 mg by mouth daily.    . [employer] MAGNESIUM 250 MG TABS Take 1 tablet by mouth daily.  0  . feeding supplement (BOOST / RESOURCE BREEZE) LIQD Take 1 Container by mouth 3 (three) times daily between meals. (Patient taking differently: Take 1 Container by mouth 3 (three) times daily between meals. )  0  . HYDROcodone-acetaminophen (HYCET) 7.5-325 mg/15 ml solution Take 10 mLs by mouth every 8 (eight) hours as needed for moderate pain. 20 mL 0  . lisinopril (PRINIVIL) 10 MG tablet Take 1 tablet (10 mg total) by mouth daily. 30 tablet 1  . magic  mouthwash w/lidocaine SOLN Take 5 mLs by mouth 4 (four) times daily. 300 mL 0  . mirtazapine (REMERON) 15 MG tablet Take 15 mg by mouth at bedtime.     . phenytoin (DILANTIN) 100 MG ER capsule Take 2 caps PO every morning, take 3 caps PO every night 70 capsule 0  . pravastatin (PRAVACHOL) 40 MG tablet Take 40 mg by mouth every evening.     Marland Kitchen PRESCRIPTION MEDICATION He receives his chemo treatments at the [facility] with Dr. Burr Medico. He is on a 14 day cycle of FOLFOX.    Marland Kitchen prochlorperazine (COMPAZINE) 10 MG tablet Take 1 tablet (10 mg total) by mouth every 6 (six) hours as needed. 30 tablet 3  . terazosin (HYTRIN) 10 MG capsule Take 10 mg by mouth at bedtime.   0   No current facility-administered medications for this visit.    Facility-Administered Medications Ordered in Other Visits  Medication Dose Route Frequency Provider Last Rate Last Dose  . CARBOplatin (PARAPLATIN) 300 mg in sodium chloride 0.9 % 100 mL chemo infusion  300 mg Intravenous Once Truitt Merle, MD      . heparin lock flush 100 unit/mL  500 Units Intracatheter Once PRN Truitt Merle, MD      . PACLitaxel (TAXOL) 174 mg in dextrose 5 % 250 mL chemo infusion (> 44m/m2)  100 mg/m2 (Treatment Plan Recorded) Intravenous Once YTruitt Merle MD 93 mL/hr at 12/10/15 1246 174 mg at 12/10/15 1246  . sodium chloride flush (NS) 0.9 % injection 10 mL  10 mL Intracatheter PRN YTruitt Merle MD        REVIEW OF SYSTEMS:   Constitutional: Denies fevers, chills or abnormal night sweats Eyes: Denies blurriness of vision, double vision or watery eyes Ears, nose, mouth, throat, and face: Denies mucositis or sore throat Respiratory: Denies cough, dyspnea or wheezes Cardiovascular: Denies palpitation, chest discomfort or lower extremity swelling Gastrointestinal: mild heartburn, Denies nausea, vomiting or change in bowel habits Skin: Denies abnormal skin rashes Lymphatics: Denies new lymphadenopathy or easy bruising Neurological: (+)  numbness on his fingers, no tingling or new weaknesses Behavioral/Psych: Mood is stable, no new changes  All other systems were reviewed with the patient and are negative.  PHYSICAL EXAMINATION: ECOG PERFORMANCE STATUS: 1 - Symptomatic but completely ambulatory  Vitals:   12/10/15 1002  BP: 110/60  Pulse: 95  Resp: 18  Temp: 98.2 F (36.8 C)   Filed  Weights   12/10/15 1002  Weight: 133 lb 6.4 oz (60.5 kg)    GENERAL:alert, no distress and comfortable SKIN: skin color, texture, turgor are normal, no rashes or significant lesions EYES: normal, conjunctiva are pink and non-injected, sclera clear OROPHARYNX:no exudate, no erythema and lips, buccal mucosa, and tongue normal  NECK: supple, thyroid normal size, non-tender, without nodularity LYMPH:  no palpable lymphadenopathy in the cervical, axillary or inguinal LUNGS: clear to auscultation and percussion with normal breathing effort HEART: regular rate & rhythm and no murmurs and no lower extremity edema ABDOMEN:abdomen soft, non-tender and normal bowel sounds Musculoskeletal:no cyanosis of digits and no clubbing, (+) tenderness at right hip and buttock area PSYCH: alert & oriented x 3 with fluent speech NEURO: no focal motor/sensory deficits  LABORATORY DATA:  I have reviewed the data as listed CBC Latest Ref Rng & Units 12/10/2015 11/29/2015 11/22/2015  WBC 4.0 - 10.3 10e3/uL 3.4(L) 3.5(L) 3.4(L)  Hemoglobin 13.0 - 17.1 g/dL 9.8(L) 9.4(L) 9.1(L)  Hematocrit 38.4 - 49.9 % 28.7(L) 27.5(L) 26.3(L)  Platelets 140 - 400 10e3/uL 132(L) 65(L) 58(L)    CMP Latest Ref Rng & Units 12/10/2015 11/29/2015 11/22/2015  Glucose 70 - 140 mg/dl 167(H) 249(H) 244(H)  BUN 7.0 - 26.0 mg/dL 19.7 11.5 15.6  Creatinine 0.7 - 1.3 mg/dL 0.9 0.8 0.8  Sodium 136 - 145 mEq/L 140 138 139  Potassium 3.5 - 5.1 mEq/L 4.4 3.9 3.8  Chloride 101 - 111 mmol/L - - -  CO2 22 - 29 mEq/L _0 Calcium 8.4 - 10.4 mg/dL 9.4 8.8 8.6  Total Protein 6.4 - 8.3 g/dL  8.6(H) 8.0 7.7  Total Bilirubin 0.20 - 1.20 mg/dL <0.30 <0.30 <0.30  Alkaline Phos 40 - 150 U/L 362(H) 323(H) 319(H)  AST 5 - 34 U/L 36(H) 29 26  ALT 0 - 55 U/L 14 <9 9   Pathology report  Diagnosis 01/26/2015 Bone, biopsy, r iliac - 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 Microscopic Comment The metastatic carcinoma is positive by immunohistochemistry with p63, cytokeratin 903 and cytokeratin 5/6. The tumor is negative with CD56, synaptophysin, chromogranin, thyroid transcription factor-1 and S100. (JDP:gt, 01/30/15)   RADIOGRAPHIC STUDIES: I have personally reviewed the radiological images as listed and agreed with the findings in the report.  PET 10/04/2015 IMPRESSION: 1. Response to therapy. 2. Decrease in hypermetabolism of bilateral pulmonary nodules/metastasis. Resolution of left suprahilar hypermetabolism. 3. Decreased hypermetabolism within osseous metastasis, with increased sclerosis within the dominant right iliac wing mass. This is consistent with interval healing. 4. No new sites of disease. 5. Mild nonspecific residual mid esophageal hypermetabolism. 6. Prostatomegaly with probable bladder outlet obstruction.  ASSESSMENT & PLAN: 70 year old African-American male, with past medical history of hypertension, history of stroke, coronary artery disease, who presents with progressive dysphasia.  1. Distal esophagus squamous cell carcinoma, [id number] with lung and bone mets -I previously reviewed his EGD, CT and PET scan findings and the biopsy results in great details with patient and his daughter. -I discussed his right iliac bone biopsy results with patient and his son, unfortunately biopsy confirmed metastatic squamous cell carcinoma. -We reviewed the natural course of esophagitis cancer, and the incurable nature of his disease. We emphasized the goal of therapy is palliation and prolonged his life. -He has completed a short course of palliative radiation    - He is on second line chemotherapy carboplatin and Taxol, every 3 weeks -I reviewed the restaging PET scan images from 10/04/2015 with patient and his family  members in person. His pulmonary and bone metastasis has overall improved, corresponding to partial response to chemotherapy. 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 I'll refer him to radiation oncology Dr. Lisbeth Renshaw for palliative radiation. -I will get a restaging PET in the next few weeks   2. Seizure -he had a seizure episode this morning. Resolved now. He is compliant with Dilantin. -I strongly encouraged him to follow-up with his urologist.  3. HTN, CAD, history of CVA -Follow up with primary care physician -We'll watch his blood pressure closely when he is on chemotherapy, normal lately.  4. 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  -His mild anemia has been stable, no need for transfusion, we'll continue monitoring  5. Hyperglycemia -his BG has been in 200-300 range, probably related to pre-meds no steroids,  history of DM -I strongly encourage him to follow up with PCP, I will monitor his BG closely when on chemo   6. Peripheral neuropathy, G1-2 -Slightly improved since we held Taxol last cycle. Mainly numbness, and mild tingling, no pain -We'll continue observe closely. -We'll consider Neurontin if his  neuropathy gets worse, especially tingling and pain   Plan -We'll proceed cycle 5 carboplatin AUC 3.5, and Taxol 139m/m2 today  -We'll refer him back to Dr. MLisbeth Renshawfor palliative radiation to right iliac wing  -PET scan in a few weeks, hopefully before he starts radiation  -I will see him back in 4 weeks for follow up, and cycle 6 chemo if no disease progression (other than right iliac bone)  All questions were answered. The patient knows to call the clinic with any problems, questions or concerns.  I spent 25 minutes counseling the patient face to face. The total time spent in the appointment was 30 minutes and more than 50% was on counseling.     FTruitt Merle MD 12/10/2015

## 2015-12-12 ENCOUNTER — Telehealth: Payer: Self-pay | Admitting: *Deleted

## 2015-12-12 ENCOUNTER — Telehealth: Payer: Self-pay | Admitting: Hematology

## 2015-12-12 ENCOUNTER — Ambulatory Visit: Payer: Medicare Other | Admitting: Hematology

## 2015-12-12 NOTE — Telephone Encounter (Signed)
Received call from wife [name] requesting a call back from nurse about pt's sore throat.   Spoke with pt and was informed that he has had mild sore throat for a while.  Pt did mention to md at last office visit on Mon 12/10/15.   Denied fever, able to eat, drink, and swallow fine.  Stated he had taken something in the past for the same symptom.  Nurse noted pt was given MMW in the past.   Pt would like for script to be sent to [place].   Dr. Burr Medico notified. Pt's    Phone     [phone].

## 2015-12-12 NOTE — Telephone Encounter (Signed)
Spoke with pt to confirm 8/21 appt date/time per pof

## 2015-12-17 NOTE — Progress Notes (Signed)
Histology and Location of Primary Cancer: esophageal squamous cell carcinoma  Location(s) of Symptomatic Metastases: extensive right iliac wing bone metastasis   Past/Anticipated chemotherapy by medical oncology, if any: 08/09/15 - second line chemo Carboplatin AUC 5 and paclitaxel 1 75 mg/m, every 3 weeks   Pain on a scale of 0-10 is: 0  - reports having pain when he its for awhile and tries to get up.  He said it eases up when he walks.   If Spine Met(s), symptoms, if any, include:  Bowel/Bladder retention or incontinence (please describe): reports having constipation, last bm was today.  Numbness or weakness in extremities (please describe): reports numbness in his fingers  Current Decadron regimen, if applicable: none  Ambulatory status? Walker? Wheelchair?: Ambulatory  SAFETY ISSUES:  Prior radiation? 01/25/15-02/13/15 35 gray for esophageal cancer   Pacemaker/ICD? no  Possible current pregnancy? no  Is the patient on methotrexate? no  Current Complaints / other details:  Reports having some soreness in his throat that started a week ago.  He is able to eat what he wants.  BP (!) 98/49 (BP Location: Left Arm, Patient Position: Sitting)   Pulse 81   Temp 98.4 F (36.9 C) (Oral)   Ht '5\' 8"'$  (1.727 m)   Wt 136 lb 11.2 oz (62 kg)   SpO2 100%   BMI 20.79 kg/m    Wt Readings from Last 3 Encounters:  12/20/15 136 lb 11.2 oz (62 kg)  12/10/15 133 lb 6.4 oz (60.5 kg)  11/22/15 137 lb 12.8 oz (62.5 kg)

## 2015-12-20 ENCOUNTER — Ambulatory Visit
Admission: RE | Admit: 2015-12-20 | Discharge: 2015-12-20 | Disposition: A | Discharge status: Home / Self Care | Payer: Medicare Other | Source: Ambulatory Visit | Attending: Radiation Oncology | Admitting: Radiation Oncology

## 2015-12-20 ENCOUNTER — Encounter: Payer: Self-pay | Admitting: Radiation Oncology

## 2015-12-20 ENCOUNTER — Ambulatory Visit (HOSPITAL_COMMUNITY)
Admission: RE | Admit: 2015-12-20 | Discharge: 2015-12-20 | Disposition: A | Discharge status: Home / Self Care | Payer: Medicare Other | Source: Ambulatory Visit | Attending: Hematology | Admitting: Hematology

## 2015-12-20 VITALS — BP 98/49 | HR 81 | Temp 98.4°F | Ht 68.0 in | Wt 136.7 lb

## 2015-12-20 DIAGNOSIS — Z79899 Other long term (current) drug therapy: Secondary | ICD-10-CM | POA: Diagnosis not present

## 2015-12-20 DIAGNOSIS — C7951 Secondary malignant neoplasm of bone: Secondary | ICD-10-CM | POA: Insufficient documentation

## 2015-12-20 DIAGNOSIS — M899 Disorder of bone, unspecified: Secondary | ICD-10-CM | POA: Insufficient documentation

## 2015-12-20 DIAGNOSIS — C7800 Secondary malignant neoplasm of unspecified lung: Secondary | ICD-10-CM | POA: Diagnosis not present

## 2015-12-20 DIAGNOSIS — C159 Malignant neoplasm of esophagus, unspecified: Secondary | ICD-10-CM

## 2015-12-20 DIAGNOSIS — J029 Acute pharyngitis, unspecified: Secondary | ICD-10-CM | POA: Diagnosis not present

## 2015-12-20 DIAGNOSIS — K5900 Constipation, unspecified: Secondary | ICD-10-CM | POA: Diagnosis not present

## 2015-12-20 DIAGNOSIS — I2510 Atherosclerotic heart disease of native coronary artery without angina pectoris: Secondary | ICD-10-CM | POA: Insufficient documentation

## 2015-12-20 DIAGNOSIS — D630 Anemia in neoplastic disease: Secondary | ICD-10-CM | POA: Insufficient documentation

## 2015-12-20 DIAGNOSIS — E46 Unspecified protein-calorie malnutrition: Secondary | ICD-10-CM | POA: Diagnosis not present

## 2015-12-20 DIAGNOSIS — Z923 Personal history of irradiation: Secondary | ICD-10-CM | POA: Diagnosis not present

## 2015-12-20 DIAGNOSIS — I10 Essential (primary) hypertension: Secondary | ICD-10-CM | POA: Insufficient documentation

## 2015-12-20 DIAGNOSIS — C787 Secondary malignant neoplasm of liver and intrahepatic bile duct: Secondary | ICD-10-CM | POA: Diagnosis not present

## 2015-12-20 DIAGNOSIS — Z9221 Personal history of antineoplastic chemotherapy: Secondary | ICD-10-CM | POA: Insufficient documentation

## 2015-12-20 DIAGNOSIS — K769 Liver disease, unspecified: Secondary | ICD-10-CM | POA: Insufficient documentation

## 2015-12-20 DIAGNOSIS — Z7982 Long term (current) use of aspirin: Secondary | ICD-10-CM | POA: Insufficient documentation

## 2015-12-20 DIAGNOSIS — Z510 Encounter for antineoplastic radiation therapy: Secondary | ICD-10-CM | POA: Insufficient documentation

## 2015-12-20 LAB — GLUCOSE, CAPILLARY: Glucose-Capillary: 121 mg/dL — ABNORMAL HIGH (ref 65–99)

## 2015-12-20 MED ORDER — FLUDEOXYGLUCOSE F - 18 (FDG) INJECTION
6.5600 | Freq: Once | INTRAVENOUS | Status: AC | PRN
  Administered 2015-12-20: 6.56 via INTRAVENOUS

## 2015-12-20 NOTE — Progress Notes (Signed)
Radiation Oncology         ([phone] ________________________________  Name: Andre Guzman MRN: [id number]  Date: 12/20/2015  DOB: 1946/04/04   Re-Evaluation Visit Note  CC: Maximino Greenland, MD  Truitt Merle, MD    ICD-9-CM ICD-10-CM   1. 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  Narrative:  The patient returns today for a re-evaluation. Since the completion of his radiation, he has undergone chemotherapy with mFOLFOX every 2 weeks from 02/22/15 - 07/19/15, but this was stopped due to disease progression. On 08/09/15, he started second line chemotherapy with Carboplatin and Paclitaxel every 3 weeks. PET scan on 10/04/15 showed a response to therapy and no new sites of disease. PET scan today showed interval progression of disease. The patient has been referred back to me for the consideration of palliative radiation to this extensive right iliac wing bone metastasis.  The patient reports pain when he sits for a while and when he tries to get up. It lessens when he begins to walk. He has constipation with his last bowel movement today. He has numbness in his fingers. He has a sore throat that started a week ago and he's able to eat what he wants.  ALLERGIES:  has No Known Allergies.  Meds: Current Outpatient Prescriptions  Medication Sig Dispense Refill  . albuterol (PROVENTIL HFA;VENTOLIN HFA) 108 (90 BASE) MCG/ACT inhaler Inhale 1-2 puffs into the lungs every 6 (six) hours as needed for wheezing or shortness of breath. Reported on 05/17/2015    . [employer] MAGNESIUM 250 MG TABS Take 1 tablet by mouth daily.  0  . feeding supplement (BOOST / RESOURCE BREEZE) LIQD Take 1 Container by mouth 3 (three) times daily between meals. (Patient taking differently: Take 1 Container by mouth 3 (three) times daily between  meals. )  0  . lisinopril (PRINIVIL) 10 MG tablet Take 1 tablet (10 mg total) by mouth daily. 30 tablet 1  . phenytoin (DILANTIN) 100 MG ER capsule Take 2 caps PO every morning, take 3 caps PO every night 70 capsule 0  . pravastatin (PRAVACHOL) 40 MG tablet Take 40 mg by mouth every evening.     Marland Kitchen PRESCRIPTION MEDICATION He receives his chemo treatments at the [facility] at [facility] with Dr. Burr Medico. He is on a 14 day cycle of FOLFOX.    Marland Kitchen prochlorperazine (COMPAZINE) 10 MG tablet Take 1 tablet (10 mg total) by mouth every 6 (six) hours as needed. 30 tablet 3  . terazosin (HYTRIN) 10 MG capsule Take 10 mg by mouth at bedtime.   0  . aspirin EC 81 MG tablet Take 81 mg by mouth daily.    Marland Kitchen HYDROcodone-acetaminophen (HYCET) 7.5-325 mg/15 ml solution Take 10 mLs by mouth every 8 (eight) hours as needed for moderate pain. (Patient not taking: Reported on 12/20/2015) 20 mL 0  . magic mouthwash w/lidocaine SOLN Take 5 mLs by mouth 4 (four) times daily. (Patient not taking: Reported on 12/20/2015) 300 mL 0  . mirtazapine (REMERON) 15 MG tablet Take 15 mg by mouth at bedtime.      No current facility-administered medications for this encounter.     Physical Findings: The patient is in no acute distress. Patient is alert and oriented.  height is 5\' 8"  (1.727 m) and weight is  136 lb 11.2 oz (62 kg). His oral temperature is 98.4 F (36.9 C). His blood pressure is 98/49 (abnormal) and his pulse is 81. His oxygen saturation is 100%.    Lungs are clear to auscultation bilaterally. Heart has regular rate and rhythm. No palpable cervical, supraclavicular, or axillary adenopathy. Oropharynx is clear with no secondary infection. Good strength in the lower extremities.  Lab Findings: Lab Results  Component Value Date   WBC 3.4 (L) 12/10/2015   HGB 9.8 (L) 12/10/2015   HCT 28.7 (L) 12/10/2015   MCV 95.0 12/10/2015   PLT 132 (L) 12/10/2015    Radiographic Findings: Nm Pet Image Restag (ps) Skull  Base To Thigh  Result Date: 12/20/2015 CLINICAL DATA:  Subsequent treatment strategy for esophageal cancer. EXAM: NUCLEAR MEDICINE PET SKULL BASE TO THIGH TECHNIQUE: 6.6 mCi F-18 FDG was injected intravenously. Full-ring PET imaging was performed from the skull base to thigh after the radiotracer. CT data was obtained and used for attenuation correction and anatomic localization. FASTING BLOOD GLUCOSE:  Value: 121 mg/dl COMPARISON:  10/04/2015 FINDINGS: NECK There is a new 1.4 x 2.8 cm hypermetabolic lesion in or just superficial to the left parotid gland (image 16 series 4) with SUV max = 4.5. CHEST Previously described focal hypermetabolism is again seen in the region of the mid esophagus, just proximal to the carina with SUV max = 4.5 today compared to 4.5 previously. The medial right middle lobe pulmonary nodule has become more well-defined in the interval and measures 16 mm today compared to 11 mm previously. SUV max = 8.2 on today's study compared to 3.3 previously. Posterior left upper lobe pulmonary nodule measures 12 mm today compared 11 mm previously. SUV max = 10.5 today compared to 2.0 previously. ABDOMEN/PELVIS A new 17 mm posterior right liver lesion is hypermetabolic with SUV max = 6.9. Inferior right liver lesion measuring 15 mm on CT imaging demonstrates SUV max = 10.7. Multiple other smaller hypermetabolic liver lesions are evident. SKELETON The posterior right twelfth rib hypermetabolic disease demonstrates SUV max = 8.9 today compared to 5.8 previously. Hypermetabolic disease in the right iliac crest is now seen to extend into the right aspect of the sacrum. IMPRESSION: 1. Interval progression of disease. 2. Interval development of multiple hypermetabolic liver lesions, consistent with metastatic involvement. 3. Interval enlargement and associated increased hypermetabolism of the patient's known pulmonary metastases. 4. Interval increase in hypermetabolism of the right posterior twelfth rib  lesion with abnormal hypermetabolism now seen in the right sacrum adjacent to the previously demonstrated right iliac disease. 5. New hypermetabolic lesion in or just superficial to the left parotid gland. Metastatic disease at this location is a concern. Electronically Signed   By: Misty Stanley M.D.   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 The patient has significant disease along the right iliac wing and this has progressed since his previous scans as evidenced on his PET scan from earlier today. The patient would be a good candidate for a short course of palliative radiotherapy directed to the right iliac bone and adjacent right sacrum.  Plan: Today, I talked to the patient  about the findings and work-up thus far.  We discussed palliative radiation to his right iliac wing bone metastasis and general treatment, highlighting the role of radiotherapy in the management.  We discussed the available radiation techniques, and focused on the details of logistics and delivery.  We reviewed the anticipated acute and late  sequelae associated with radiation in this setting.  The patient was encouraged to ask questions that I answered to the best of my ability.  The patient signed a consent form and we retained a copy for our records.  The patient would like to proceed with radiation and will be scheduled for CT simulation on 12/24/15 at 1PM. ____________________________________ -----------------------------------  Blair Promise, PhD, MD  This document serves as a record of services personally performed by Gery Pray, MD. It was created on his behalf by Darcus Austin, a trained medical scribe. The creation of this record is based on the scribe's personal observations and the provider's statements to them. This document has been checked and approved by the attending provider.

## 2015-12-20 NOTE — Progress Notes (Signed)
Please see the Nurse Progress Note in the MD Initial Consult Encounter for this patient. 

## 2015-12-24 ENCOUNTER — Ambulatory Visit: Payer: Medicare Other | Admitting: Radiation Oncology

## 2015-12-27 ENCOUNTER — Ambulatory Visit
Admission: RE | Admit: 2015-12-27 | Discharge: 2015-12-27 | Disposition: A | Discharge status: Home / Self Care | Payer: Medicare Other | Source: Ambulatory Visit | Attending: Radiation Oncology | Admitting: Radiation Oncology

## 2015-12-27 DIAGNOSIS — C159 Malignant neoplasm of esophagus, unspecified: Secondary | ICD-10-CM

## 2015-12-27 DIAGNOSIS — C7951 Secondary malignant neoplasm of bone: Secondary | ICD-10-CM | POA: Diagnosis not present

## 2015-12-27 DIAGNOSIS — K5900 Constipation, unspecified: Secondary | ICD-10-CM | POA: Diagnosis not present

## 2015-12-27 DIAGNOSIS — J029 Acute pharyngitis, unspecified: Secondary | ICD-10-CM | POA: Diagnosis not present

## 2015-12-27 DIAGNOSIS — Z9221 Personal history of antineoplastic chemotherapy: Secondary | ICD-10-CM | POA: Diagnosis not present

## 2015-12-27 DIAGNOSIS — Z79899 Other long term (current) drug therapy: Secondary | ICD-10-CM | POA: Diagnosis not present

## 2015-12-27 DIAGNOSIS — Z510 Encounter for antineoplastic radiation therapy: Secondary | ICD-10-CM | POA: Diagnosis not present

## 2015-12-27 DIAGNOSIS — Z7982 Long term (current) use of aspirin: Secondary | ICD-10-CM | POA: Diagnosis not present

## 2015-12-27 DIAGNOSIS — Z923 Personal history of irradiation: Secondary | ICD-10-CM | POA: Diagnosis not present

## 2015-12-28 ENCOUNTER — Ambulatory Visit: Payer: Medicare Other | Admitting: Hematology

## 2015-12-31 NOTE — Progress Notes (Signed)
  Radiation Oncology         [phone] ________________________________  Name: Andre Guzman MRN: [id number]  Date: 12/27/2015  DOB: 23-May-1945  SIMULATION AND TREATMENT PLANNING NOTE    ICD-9-CM ICD-10-CM   1. 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   NARRATIVE:  The patient was brought to the [facility] [place].  Identity was confirmed.  All relevant records and images related to the planned course of therapy were reviewed.  The patient freely provided informed written consent to proceed with treatment after reviewing the details related to the planned course of therapy. The consent form was witnessed and verified by the simulation staff.  Then, the patient was set-up in a stable reproducible  supine position for radiation therapy.  CT images were obtained.  Surface markings were placed.  The CT images were loaded into the planning software.  Then the target and avoidance structures were contoured.  Treatment planning then occurred.  The radiation prescription was entered and confirmed.  Then, I designed and supervised the construction of a total of 3 medically necessary complex treatment devices.  I have requested : 3D Simulation  I have requested a DVH of the following structures: GTV, PTV, bowel.  I have ordered:dose calc.  PLAN:  The patient will receive 37.5 Gy in 15 fractions.  -----------------------------------  Blair Promise, PhD, MD

## 2016-01-01 DIAGNOSIS — C7951 Secondary malignant neoplasm of bone: Secondary | ICD-10-CM | POA: Diagnosis not present

## 2016-01-01 DIAGNOSIS — C159 Malignant neoplasm of esophagus, unspecified: Secondary | ICD-10-CM | POA: Diagnosis not present

## 2016-01-01 DIAGNOSIS — Z9221 Personal history of antineoplastic chemotherapy: Secondary | ICD-10-CM | POA: Diagnosis not present

## 2016-01-01 DIAGNOSIS — Z923 Personal history of irradiation: Secondary | ICD-10-CM | POA: Diagnosis not present

## 2016-01-01 DIAGNOSIS — Z510 Encounter for antineoplastic radiation therapy: Secondary | ICD-10-CM | POA: Diagnosis not present

## 2016-01-01 DIAGNOSIS — J029 Acute pharyngitis, unspecified: Secondary | ICD-10-CM | POA: Diagnosis not present

## 2016-01-01 DIAGNOSIS — K5900 Constipation, unspecified: Secondary | ICD-10-CM | POA: Diagnosis not present

## 2016-01-01 DIAGNOSIS — Z7982 Long term (current) use of aspirin: Secondary | ICD-10-CM | POA: Diagnosis not present

## 2016-01-01 DIAGNOSIS — Z79899 Other long term (current) drug therapy: Secondary | ICD-10-CM | POA: Diagnosis not present

## 2016-01-03 ENCOUNTER — Ambulatory Visit
Admission: RE | Admit: 2016-01-03 | Discharge: 2016-01-03 | Disposition: A | Discharge status: Home / Self Care | Payer: Medicare Other | Source: Ambulatory Visit | Attending: Radiation Oncology | Admitting: Radiation Oncology

## 2016-01-03 DIAGNOSIS — J029 Acute pharyngitis, unspecified: Secondary | ICD-10-CM | POA: Diagnosis not present

## 2016-01-03 DIAGNOSIS — K5900 Constipation, unspecified: Secondary | ICD-10-CM | POA: Diagnosis not present

## 2016-01-03 DIAGNOSIS — C159 Malignant neoplasm of esophagus, unspecified: Secondary | ICD-10-CM | POA: Diagnosis not present

## 2016-01-03 DIAGNOSIS — Z510 Encounter for antineoplastic radiation therapy: Secondary | ICD-10-CM | POA: Diagnosis not present

## 2016-01-03 DIAGNOSIS — C7951 Secondary malignant neoplasm of bone: Secondary | ICD-10-CM | POA: Diagnosis not present

## 2016-01-03 DIAGNOSIS — Z9221 Personal history of antineoplastic chemotherapy: Secondary | ICD-10-CM | POA: Diagnosis not present

## 2016-01-03 DIAGNOSIS — Z7982 Long term (current) use of aspirin: Secondary | ICD-10-CM | POA: Diagnosis not present

## 2016-01-03 DIAGNOSIS — Z923 Personal history of irradiation: Secondary | ICD-10-CM | POA: Diagnosis not present

## 2016-01-03 DIAGNOSIS — Z79899 Other long term (current) drug therapy: Secondary | ICD-10-CM | POA: Diagnosis not present

## 2016-01-03 NOTE — Progress Notes (Signed)
  Radiation Oncology         [phone] ________________________________  Name: Andre Guzman MRN: [id number]  Date: 01/03/2016  DOB: August 01, 1945  Simulation Verification Note    ICD-9-CM ICD-10-CM   1. Squamous cell esophageal cancer (HCC) 150.9 C15.9     Status: outpatient  NARRATIVE: The patient was brought to the treatment unit and placed in the planned treatment position. The clinical setup was verified. Then port films were obtained and uploaded to the radiation oncology medical record software.  The treatment beams were carefully compared against the planned radiation fields. The position location and shape of the radiation fields was reviewed. They targeted volume of tissue appears to be appropriately covered by the radiation beams. Organs at risk appear to be excluded as planned.  Based on my personal review, I approved the simulation verification. The patient's treatment will proceed as planned.  -----------------------------------  Blair Promise, PhD, MD

## 2016-01-04 ENCOUNTER — Ambulatory Visit
Admission: RE | Admit: 2016-01-04 | Discharge: 2016-01-04 | Disposition: A | Discharge status: Home / Self Care | Payer: Medicare Other | Source: Ambulatory Visit | Attending: Radiation Oncology | Admitting: Radiation Oncology

## 2016-01-04 DIAGNOSIS — C159 Malignant neoplasm of esophagus, unspecified: Secondary | ICD-10-CM | POA: Diagnosis not present

## 2016-01-04 DIAGNOSIS — Z79899 Other long term (current) drug therapy: Secondary | ICD-10-CM | POA: Diagnosis not present

## 2016-01-04 DIAGNOSIS — Z7982 Long term (current) use of aspirin: Secondary | ICD-10-CM | POA: Diagnosis not present

## 2016-01-04 DIAGNOSIS — K5900 Constipation, unspecified: Secondary | ICD-10-CM | POA: Diagnosis not present

## 2016-01-04 DIAGNOSIS — C7951 Secondary malignant neoplasm of bone: Secondary | ICD-10-CM | POA: Diagnosis not present

## 2016-01-04 DIAGNOSIS — Z510 Encounter for antineoplastic radiation therapy: Secondary | ICD-10-CM | POA: Diagnosis not present

## 2016-01-04 DIAGNOSIS — Z923 Personal history of irradiation: Secondary | ICD-10-CM | POA: Diagnosis not present

## 2016-01-04 DIAGNOSIS — J029 Acute pharyngitis, unspecified: Secondary | ICD-10-CM | POA: Diagnosis not present

## 2016-01-04 DIAGNOSIS — Z9221 Personal history of antineoplastic chemotherapy: Secondary | ICD-10-CM | POA: Diagnosis not present

## 2016-01-07 ENCOUNTER — Telehealth: Payer: Self-pay | Admitting: Hematology

## 2016-01-07 ENCOUNTER — Other Ambulatory Visit (HOSPITAL_BASED_OUTPATIENT_CLINIC_OR_DEPARTMENT_OTHER): Payer: Medicare Other

## 2016-01-07 ENCOUNTER — Ambulatory Visit: Payer: Medicare Other

## 2016-01-07 ENCOUNTER — Ambulatory Visit
Admission: RE | Admit: 2016-01-07 | Discharge: 2016-01-07 | Disposition: A | Discharge status: Home / Self Care | Payer: Medicare Other | Source: Ambulatory Visit | Attending: Radiation Oncology | Admitting: Radiation Oncology

## 2016-01-07 ENCOUNTER — Ambulatory Visit (HOSPITAL_BASED_OUTPATIENT_CLINIC_OR_DEPARTMENT_OTHER): Payer: Medicare Other | Admitting: Hematology

## 2016-01-07 ENCOUNTER — Encounter: Payer: Self-pay | Admitting: Hematology

## 2016-01-07 VITALS — BP 108/56 | HR 97 | Temp 98.5°F | Resp 17 | Ht 68.0 in | Wt 133.4 lb

## 2016-01-07 DIAGNOSIS — I2510 Atherosclerotic heart disease of native coronary artery without angina pectoris: Secondary | ICD-10-CM | POA: Diagnosis not present

## 2016-01-07 DIAGNOSIS — G620 Drug-induced polyneuropathy: Secondary | ICD-10-CM

## 2016-01-07 DIAGNOSIS — Z923 Personal history of irradiation: Secondary | ICD-10-CM | POA: Diagnosis not present

## 2016-01-07 DIAGNOSIS — C155 Malignant neoplasm of lower third of esophagus: Secondary | ICD-10-CM | POA: Diagnosis not present

## 2016-01-07 DIAGNOSIS — K5900 Constipation, unspecified: Secondary | ICD-10-CM | POA: Diagnosis not present

## 2016-01-07 DIAGNOSIS — Z510 Encounter for antineoplastic radiation therapy: Secondary | ICD-10-CM | POA: Diagnosis not present

## 2016-01-07 DIAGNOSIS — Z79899 Other long term (current) drug therapy: Secondary | ICD-10-CM | POA: Diagnosis not present

## 2016-01-07 DIAGNOSIS — C7951 Secondary malignant neoplasm of bone: Secondary | ICD-10-CM

## 2016-01-07 DIAGNOSIS — I10 Essential (primary) hypertension: Secondary | ICD-10-CM

## 2016-01-07 DIAGNOSIS — D630 Anemia in neoplastic disease: Secondary | ICD-10-CM

## 2016-01-07 DIAGNOSIS — C787 Secondary malignant neoplasm of liver and intrahepatic bile duct: Secondary | ICD-10-CM

## 2016-01-07 DIAGNOSIS — E46 Unspecified protein-calorie malnutrition: Secondary | ICD-10-CM

## 2016-01-07 DIAGNOSIS — C159 Malignant neoplasm of esophagus, unspecified: Secondary | ICD-10-CM

## 2016-01-07 DIAGNOSIS — J029 Acute pharyngitis, unspecified: Secondary | ICD-10-CM | POA: Diagnosis not present

## 2016-01-07 DIAGNOSIS — Z9221 Personal history of antineoplastic chemotherapy: Secondary | ICD-10-CM | POA: Diagnosis not present

## 2016-01-07 DIAGNOSIS — Z7982 Long term (current) use of aspirin: Secondary | ICD-10-CM | POA: Diagnosis not present

## 2016-01-07 LAB — COMPREHENSIVE METABOLIC PANEL
ALBUMIN: 3.1 g/dL — AB (ref 3.5–5.0)
ALK PHOS: 410 U/L — AB (ref 40–150)
ALT: 12 U/L (ref 0–55)
ANION GAP: 10 meq/L (ref 3–11)
AST: 36 U/L — ABNORMAL HIGH (ref 5–34)
BILIRUBIN TOTAL: 0.38 mg/dL (ref 0.20–1.20)
BUN: 15.9 mg/dL (ref 7.0–26.0)
CALCIUM: 9.6 mg/dL (ref 8.4–10.4)
CO2: 26 mEq/L (ref 22–29)
CREATININE: 0.9 mg/dL (ref 0.7–1.3)
Chloride: 103 mEq/L (ref 98–109)
EGFR: >90 mL/min/{1.73_m2} (ref >90)
Glucose: 120 mg/dl (ref 70–140)
Potassium: 4 mEq/L (ref 3.5–5.1)
Sodium: 140 mEq/L (ref 136–145)
TOTAL PROTEIN: 8.5 g/dL — AB (ref 6.4–8.3)

## 2016-01-07 LAB — CBC & DIFF AND RETIC
BASO%: 0.2 % (ref 0.0–2.0)
BASOS ABS: 0 10*3/uL (ref 0.0–0.1)
EOS ABS: 0 10*3/uL (ref 0.0–0.5)
EOS%: 0.6 % (ref 0.0–7.0)
HEMATOCRIT: 29.4 % — AB (ref 38.4–49.9)
HEMOGLOBIN: 10.2 g/dL — AB (ref 13.0–17.1)
Immature Retic Fract: 11.1 % — ABNORMAL HIGH (ref 3.00–10.60)
LYMPH#: 0.8 10*3/uL — AB (ref 0.9–3.3)
LYMPH%: 16 % (ref 14.0–49.0)
MCH: 33 pg (ref 27.2–33.4)
MCHC: 34.7 g/dL (ref 32.0–36.0)
MCV: 95.1 fL (ref 79.3–98.0)
MONO#: 0.6 10*3/uL (ref 0.1–0.9)
MONO%: 11.3 % (ref 0.0–14.0)
NEUT#: 3.7 10*3/uL (ref 1.5–6.5)
NEUT%: 71.9 % (ref 39.0–75.0)
PLATELETS: 101 10*3/uL — AB (ref 140–400)
RBC: 3.09 10*6/uL — ABNORMAL LOW (ref 4.20–5.82)
RDW: 16.3 % — ABNORMAL HIGH (ref 11.0–14.6)
RETIC CT ABS: 39.55 10*3/uL (ref 34.80–93.90)
Retic %: 1.28 % (ref 0.80–1.80)
WBC: 5.2 10*3/uL (ref 4.0–10.3)

## 2016-01-07 NOTE — Progress Notes (Signed)
[place]  Telephone:(336) [phone] Fax:(336) [phone]  Clinic Follow up Note   Patient Care Team: Glendale Chard, MD as PCP - General (Internal Medicine) Adrian Prows, MD as Consulting Physician (Cardiology) Jovita Gamma, MD as Consulting Physician (Neurosurgery) Lowella Bandy, MD as Consulting Physician (Urology) Grace Isaac, MD as Consulting Physician (Cardiothoracic Surgery) Truitt Merle, MD as Consulting Physician (Hematology) Tania Ade, RN as Registered Nurse Carol Ada, MD as Consulting Physician (Gastroenterology) Gery Pray, MD as Consulting Physician (Radiation Oncology) 01/07/2016  CHIEF COMPLAINTS:  Follow up esophageal squamous cell carcinoma  Oncology History   Squamous cell esophageal cancer   Staging form: Esophagus - Squamous Cell Carcinoma, AJCC 7th Edition     Clinical: Stage IV (TX, N0, M1) - Unsigned       Squamous cell esophageal cancer ([place])   01/02/2015 Initial Diagnosis    Squamous cell esophageal cancer      01/02/2015 Procedure    EGD by Dr. Benson Norway showed a large, fungating mass with no bleeding and with stigmata met of recent bleeding was found in the third of the esophagus. The mass was completely obstructing and circumferential. The stomach and examined duodenum were normal.      01/02/2015 Imaging    CT CAP showed 9 cm segment of irregular his social worker thickening. Single enlarged subcarinal lymph node, bilateral pulmonary nodules (3), 16m right middle lobe next to right atrium, 3 RUL and a 7 mm lingular lung nodules.       01/02/2015 Initial Biopsy    Esophageal mass biopsy showed poorly differentiated squamous cell carcinoma.      01/18/2015 Imaging    PET scan showed hypermetabolic esophageal mass, subcarinal and possible right hilar adenopathy, several lung nodules, and a hypermetabolic bone lesion in right ilium, compatible with metastatic disease.       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      08/09/2015 -  Chemotherapy    second line chemo Carboplatin AUC 5 and paclitaxel 1 75 mg/m, every 3 weeks      10/04/2015 Imaging    PET scan showed interval response to therapy, decreased in hypermetabolism of bilateral pulmonary metastases and bone metastasis, no new lesions.      12/20/2015 Imaging    Restaging PET showed interval disease progression in lung and bone, newly developed multiple liver metastasis,      01/03/2016 -  Radiation Therapy    Palliative radiation to right pelvic bone metastasis for pain control       HISTORY OF PRESENTING ILLNESS:  Andre [id number]. male is here because of recently diagnosed esophageal squamous cell carcinoma.  He has had dysphagia for one month, with solid food mainly, he is able to keep liquid and soft diet down. He lost about 30 lbs in the the past month, but his weight has been stable in the past week since he started taking boost. He had some chest pain when he swallows. He is constipated, on stool softener, no bleeding.  He  otherwise feels fine. His energy level is fair, he functions very well, no limitation on his physical activities. No nausea, no other pain, cough or other, he drinks about 2 boost a day, soup,and some soft diet.  He was referred to gastroenterologist Dr. Elnoria Howard by his primary care physician. He underwent EGD which showed a circumferential, obstructing, fungating mass in the third esophagus, biopsy showed poorly differentiated squamous cell carcinoma. CT scan chest abdomen and pelvis showed 3 lung nodules, with the  largest 1.2 cm in the right middle lobe next to the right atrium.  CURRENT THERAPY: Palliative radiation to right pelvic bone metastasis  INTERIM HISTORY Andre Guzman returns for follow up. He has started radiation to his right pelvic last week, tolerating the radiation well so far. His pain in the right hip area is intermittent, usually happens when he gets up and walks, tolerable, he takes Tylenol as needed. He denies any other new pain or other new symptoms. He has been eating well, no dysphagia, his weight is stable overall.  MEDICAL HISTORY:  Past Medical History:  Diagnosis Date  . COPD (chronic obstructive pulmonary disease) (HCC)   . Coronary artery disease   . Esophageal cancer (HCC)   . Hypertension   . Pneumonia   . Radiation 01/25/15-02/13/15   35 gray for esophageal cancer  . Seizures (HCC)   . Shortness of breath   . Stroke [facility])     SURGICAL HISTORY: Past Surgical History:  Procedure Laterality Date  . CARDIAC CATHETERIZATION    . CARDIAC VALVE SURGERY    . CORONARY ANGIOPLASTY WITH STENT PLACEMENT  07/22/2011  . CORONARY ARTERY BYPASS GRAFT    . ESOPHAGOGASTRODUODENOSCOPY  01/02/15  . LEFT HEART CATHETERIZATION WITH CORONARY ANGIOGRAM N/A 07/01/2011   Procedure: LEFT HEART CATHETERIZATION WITH CORONARY ANGIOGRAM;  Surgeon: [name], MD;  Location: [facility] CATH LAB;  Service: Cardiovascular;  Laterality: N/A;  . LEFT HEART CATHETERIZATION WITH CORONARY ANGIOGRAM N/A 10/30/2011   Procedure: LEFT HEART CATHETERIZATION WITH CORONARY ANGIOGRAM;  Surgeon: [name], MD;  Location: [facility] CATH LAB;  Service: Cardiovascular;  Laterality: N/A;  . LOWER EXTREMITY ANGIOGRAM N/A 07/01/2011   Procedure: LOWER EXTREMITY ANGIOGRAM;  Surgeon: [name], MD;  Location: [facility] CATH LAB;  Service: Cardiovascular;  Laterality: N/A;  . PERCUTANEOUS CORONARY ROTOBLATOR INTERVENTION (PCI-R) N/A 07/22/2011   Procedure: PERCUTANEOUS CORONARY ROTOBLATOR INTERVENTION (PCI-R);  Surgeon:  [name], MD;  Location: [facility] CATH LAB;  Service: Cardiovascular;  Laterality: N/A;    SOCIAL HISTORY: Social History   Social History  . Marital Status: Married, lives with his wife     Spouse Name: N/A  . Number of Children: 4, 1 daughter and 3 sons, all lives in [place]   . Years of Education: N/A   Occupational History  . Retired Corporate investment banker    Social History Main Topics  . Smoking status: Current Every Day Smoker -- 0.50 packs/day for 50 years    Types: Cigarettes  . Smokeless tobacco: Never Used  . 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 Drug Use: No  . Sexual Activity: Not on file     Comment: did not ask   Other Topics Concern  . Not on file   Social History Narrative    FAMILY HISTORY: Family History  Problem Relation Age of Onset  . Heart disease Mother   . Hypertension Mother   . Heart disease  Sister   . Hypertension Sister   . Heart disease Brother   . Hypertension Daughter     ALLERGIES:  has No Known Allergies.  MEDICATIONS:  Current Outpatient Prescriptions  Medication Sig Dispense Refill  . albuterol (PROVENTIL HFA;VENTOLIN HFA) 108 (90 BASE) MCG/ACT inhaler Inhale 1-2 puffs into the lungs every 6 (six) hours as needed for wheezing or shortness of breath. Reported on 05/17/2015    . aspirin EC 81 MG tablet Take 81 mg by mouth daily.    . [employer] MAGNESIUM 250 MG TABS Take 1 tablet by mouth daily.  0  . feeding supplement (BOOST / RESOURCE BREEZE) LIQD Take 1 Container by mouth 3 (three) times daily between meals. (Patient taking differently: Take 1 Container by mouth 3 (three) times daily between meals. )  0  . lisinopril (PRINIVIL) 10 MG tablet Take 1 tablet (10 mg total) by mouth daily. 30 tablet 1  . magic mouthwash w/lidocaine SOLN Take 5 mLs by mouth 4 (four) times daily. 300 mL 0  . mirtazapine (REMERON) 15 MG tablet Take 15 mg by mouth at bedtime.     . phenytoin (DILANTIN)  100 MG ER capsule Take 2 caps PO every morning, take 3 caps PO every night 70 capsule 0  . pravastatin (PRAVACHOL) 40 MG tablet Take 40 mg by mouth every evening.     Marland Kitchen PRESCRIPTION MEDICATION He receives his chemo treatments at the [facility] with Dr. Burr Medico. He is on a 14 day cycle of FOLFOX.    Marland Kitchen prochlorperazine (COMPAZINE) 10 MG tablet Take 1 tablet (10 mg total) by mouth every 6 (six) hours as needed. 30 tablet 3  . terazosin (HYTRIN) 10 MG capsule Take 10 mg by mouth at bedtime.   0   No current facility-administered medications for this visit.     REVIEW OF SYSTEMS:   Constitutional: Denies fevers, chills or abnormal night sweats Eyes: Denies blurriness of vision, double vision or watery eyes Ears, nose, mouth, throat, and face: Denies mucositis or sore throat Respiratory: Denies cough, dyspnea or wheezes Cardiovascular: Denies palpitation, chest discomfort or lower extremity swelling Gastrointestinal: mild heartburn, Denies nausea, vomiting or change in bowel habits Skin: Denies abnormal skin rashes Lymphatics: Denies new lymphadenopathy or easy bruising Neurological: (+) numbness on his fingers, no tingling or new weaknesses Behavioral/Psych: Mood is stable, no new changes  All other systems were reviewed with the patient and are negative.  PHYSICAL EXAMINATION: ECOG PERFORMANCE STATUS: 1 - Symptomatic but completely ambulatory  Vitals:   01/07/16 0947  BP: (!) 108/56  Pulse: 97  Resp: 17  Temp: 98.5 F (36.9 C)   Filed Weights   01/07/16 0947  Weight: 133 lb 6.4 oz (60.5 kg)    GENERAL:alert, no distress and comfortable SKIN: skin color, texture, turgor are normal, no rashes or significant lesions EYES: normal, conjunctiva are pink and non-injected, sclera clear OROPHARYNX:no exudate, no erythema and lips, buccal mucosa, and tongue normal  NECK: supple, thyroid normal size, non-tender, without nodularity LYMPH:  no palpable  lymphadenopathy in the cervical, axillary or inguinal LUNGS: clear to auscultation and percussion with normal breathing effort HEART: regular rate & rhythm and no murmurs and no lower extremity edema ABDOMEN:abdomen soft, non-tender and normal bowel sounds Musculoskeletal:no cyanosis of digits and no clubbing, (+) tenderness at right hip and buttock area PSYCH: alert & oriented x 3 with fluent speech NEURO: no focal motor/sensory deficits  LABORATORY DATA:  I have reviewed the  data as listed CBC Latest Ref Rng & Units 01/07/2016 12/10/2015 11/29/2015  WBC 4.0 - 10.3 10e3/uL 5.2 3.4(L) 3.5(L)  Hemoglobin 13.0 - 17.1 g/dL 10.2(L) 9.8(L) 9.4(L)  Hematocrit 38.4 - 49.9 % 29.4(L) 28.7(L) 27.5(L)  Platelets 140 - 400 10e3/uL 101(L) 132(L) 65(L)    CMP Latest Ref Rng & Units 01/07/2016 12/10/2015 11/29/2015  Glucose 70 - 140 mg/dl 120 167(H) 249(H)  BUN 7.0 - 26.0 mg/dL 15.9 19.7 11.5  Creatinine 0.7 - 1.3 mg/dL 0.9 0.9 0.8  Sodium 136 - 145 mEq/L 140 140 138  Potassium 3.5 - 5.1 mEq/L 4.0 4.4 3.9  Chloride 101 - 111 mmol/L - - -  CO2 22 - 29 mEq/L '26 26 23  '$ Calcium 8.4 - 10.4 mg/dL 9.6 9.4 8.8  Total Protein 6.4 - 8.3 g/dL 8.5(H) 8.6(H) 8.0  Total Bilirubin 0.20 - 1.20 mg/dL 0.38 <0.30 <0.30  Alkaline Phos 40 - 150 U/L 410(H) 362(H) 323(H)  AST 5 - 34 U/L 36(H) 36(H) 29  ALT 0 - 55 U/L 12 14 <9   Pathology report  Diagnosis 01/26/2015 Bone, biopsy, r iliac - 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 Microscopic Comment The metastatic carcinoma is positive by immunohistochemistry with p63, cytokeratin 903 and cytokeratin 5/6. The tumor is negative with CD56, synaptophysin, chromogranin, thyroid transcription factor-1 and S100. (JDP:gt, 01/30/15)   RADIOGRAPHIC STUDIES: I have personally reviewed the radiological images as listed and agreed with the findings in the report.  PET 12/20/2015 IMPRESSION: 1. Interval progression of disease. 2. Interval development of multiple  hypermetabolic liver lesions, consistent with metastatic involvement. 3. Interval enlargement and associated increased hypermetabolism of the patient's known pulmonary metastases. 4. Interval increase in hypermetabolism of the right posterior twelfth rib lesion with abnormal hypermetabolism now seen in the right sacrum adjacent to the previously demonstrated right iliac disease. 5. New hypermetabolic lesion in or just superficial to the left parotid gland. Metastatic disease at this location is a concern.    ASSESSMENT & PLAN: 70 year old African-American male, with past medical history of hypertension, history of stroke, coronary artery disease, who presents with progressive dysphasia.  1. Distal esophagus squamous cell carcinoma, TxNxM1 with lung, bone and liver mets -I previously reviewed his EGD, CT and PET scan findings and the biopsy results in great details with patient and his daughter. -I discussed his right iliac bone biopsy results with patient and his son, unfortunately biopsy confirmed metastatic squamous cell carcinoma. -We reviewed the natural course of esophagitis cancer, and the incurable nature of his disease. We emphasized the goal of therapy is palliation and prolonged his life. -He has completed a short course of palliative radiation to his primary tumor, and now receiving palliative radiation to his pelvic bone metastasis -I discussed his restaging PET scan from 12/20/2015, which unfortunately showed disease progression in his bone and lung, and the new liver metastasis. -He is clinically doing well, with good organ functions and performance status, would be a candidate for further treatment -I discussed limited third line systemic treatment options, including docetaxel, irinotecan, etc., however the response rate is likely going to be low. Recently immunotherapy, especially PD1 inhibitor has been test is in esophageal cancer, and showed promising results. The response  rate has been in 10-20% rage, and up to 40% with Nivo-ipilumumab, response rate is slightly higher in PD-L1 expressed tumor, but has been seen in PT-L1 negative tumor also. -I recommend him to consider Keytruda every 3 weeks as next line therapy, when he completes radiation and potential  benefit and side effects, especially hypothyroidism, colitis, pneumonitis, and other endocrine disorders were discussed with patient in details, he agrees to proceed. Beryle Flock has not been approved for esophageal cancer, we'll try to get free supply for patient.  -I will try to get [facility] genomic testing to see If his tumor contains any targetable mutations.   2. Seizure -he had a seizure episode this morning. Resolved now. He is compliant with Dilantin. -I strongly encouraged him to follow-up with his urologist.  3. HTN, CAD, history of CVA -Follow up with primary care physician -We'll watch his blood pressure closely when he is on chemotherapy, normal lately.  4. 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  -His mild anemia has been stable, no need for transfusion, we'll continue monitoring  5. Hyperglycemia -his BG has been in 200-300 range, probably related to pre-meds no steroids,  history of DM -I strongly encourage him to follow up with PCP, I will monitor his BG closely when on chemo   6. Peripheral neuropathy, G1-2 -Slightly improved since we held Taxol last cycle. Mainly numbness, and mild tingling, no pain -We'll continue observe closely. -We'll consider Neurontin if his neuropathy gets worse, especially tingling and pain   Plan -PET scan results were discussed with patient and his wife, he has had significant disease progression. -He will continue palliative Radiation -return the week of September 5th, to start Keytruda 240 mg every 3  weeks, I will see him before his treatment   All questions were answered. The patient knows to call the clinic with any problems, questions or concerns.  I spent 25 minutes counseling the patient face to face. The total time spent in the appointment was 30 minutes and more than 50% was on counseling.     Truitt Merle, MD 01/07/2016

## 2016-01-07 NOTE — Telephone Encounter (Signed)
GAVE PATIENT AVS REPORT AND APPOINTMENTS FOR AUGUST AND September.

## 2016-01-08 ENCOUNTER — Ambulatory Visit
Admission: RE | Admit: 2016-01-08 | Discharge: 2016-01-08 | Disposition: A | Discharge status: Home / Self Care | Payer: Medicare Other | Source: Ambulatory Visit | Attending: Radiation Oncology | Admitting: Radiation Oncology

## 2016-01-08 VITALS — BP 118/74 | HR 87 | Temp 98.4°F | Ht 68.0 in | Wt 134.2 lb

## 2016-01-08 DIAGNOSIS — Z9221 Personal history of antineoplastic chemotherapy: Secondary | ICD-10-CM | POA: Diagnosis not present

## 2016-01-08 DIAGNOSIS — C7951 Secondary malignant neoplasm of bone: Secondary | ICD-10-CM | POA: Diagnosis not present

## 2016-01-08 DIAGNOSIS — Z7982 Long term (current) use of aspirin: Secondary | ICD-10-CM | POA: Diagnosis not present

## 2016-01-08 DIAGNOSIS — Z510 Encounter for antineoplastic radiation therapy: Secondary | ICD-10-CM | POA: Diagnosis not present

## 2016-01-08 DIAGNOSIS — J029 Acute pharyngitis, unspecified: Secondary | ICD-10-CM | POA: Diagnosis not present

## 2016-01-08 DIAGNOSIS — C159 Malignant neoplasm of esophagus, unspecified: Secondary | ICD-10-CM

## 2016-01-08 DIAGNOSIS — Z79899 Other long term (current) drug therapy: Secondary | ICD-10-CM | POA: Diagnosis not present

## 2016-01-08 DIAGNOSIS — Z923 Personal history of irradiation: Secondary | ICD-10-CM | POA: Diagnosis not present

## 2016-01-08 DIAGNOSIS — K5900 Constipation, unspecified: Secondary | ICD-10-CM | POA: Diagnosis not present

## 2016-01-08 NOTE — Progress Notes (Signed)
  Radiation Oncology         [phone]) [phone] ________________________________  Name: Andre Guzman MRN: [id number]  Date: 01/08/2016  DOB: 1945-11-17  Weekly Radiation Therapy Management    ICD-9-CM ICD-10-CM   1. Squamous cell esophageal cancer (HCC) 150.9 C15.9         Stage IV (TxNxM1) squamous cell carcinoma of the esophagus.    Current Dose: 10 Gy     Planned Dose:  37.5 Gy  Narrative . . . . . . . . The patient presents for routine under treatment assessment.                                                                    Set-up films were reviewed.                                 The chart was checked. Andre Guzman has completed 4 fractions to his pelvis  He denies having pain today. He does report having pain in his right hip and knee when he first stands up.  He said the pains goes away after he starts walking.  He reports having slight fatigue.  Reviewed post sim education including skin irritation and fatigue. The patient denies nausea.  Physical Findings. . .  height is 5\' 8"  (1.727 m) and weight is 134 lb 3.2 oz (60.9 kg). His oral temperature is 98.4 F (36.9 C). His blood pressure is 118/74 and his pulse is 87. His oxygen saturation is 100%. . Weight essentially stable.  No significant changes. Lungs are clear to auscultation bilaterally. Heart has regular rate and rhythm. No palpable cervical, supraclavicular, or axillary adenopathy. Abdomen soft, non-tender, normal bowel sounds.  Impression . . . . . . . The patient is tolerating radiation. Plan . . . . . . . . . . . . Continue treatment as planned.  ________________________________   Blair Promise, PhD, MD  This document serves as a record of services personally performed by Gery Pray, MD. It was created on his behalf by Truddie Hidden, a trained medical scribe. The creation of this record is based on the scribe's personal observations and the provider's statements to them. This document has been checked  and approved by the attending provider.

## 2016-01-08 NOTE — Progress Notes (Signed)
Andre Guzman has completed 2 fractions to his pelvis  He denies having pain today. He does report having pain in his right hip and knee when he first stands up.  He said the pains goes away after he starts walking.  He reports having slight fatigue.  Reviewed post sim education including skin irritation and fatigue.  BP 118/74 (BP Location: Right Arm, Patient Position: Sitting)   Pulse 87   Temp 98.4 F (36.9 C) (Oral)   Ht 5\' 8"  (1.727 m)   Wt 134 lb 3.2 oz (60.9 kg)   SpO2 100%   BMI 20.41 kg/m    Wt Readings from Last 3 Encounters:  01/08/16 134 lb 3.2 oz (60.9 kg)  01/07/16 133 lb 6.4 oz (60.5 kg)  12/20/15 136 lb 11.2 oz (62 kg)

## 2016-01-09 ENCOUNTER — Ambulatory Visit
Admission: RE | Admit: 2016-01-09 | Discharge: 2016-01-09 | Disposition: A | Discharge status: Home / Self Care | Payer: Medicare Other | Source: Ambulatory Visit | Attending: Radiation Oncology | Admitting: Radiation Oncology

## 2016-01-09 DIAGNOSIS — Z9221 Personal history of antineoplastic chemotherapy: Secondary | ICD-10-CM | POA: Diagnosis not present

## 2016-01-09 DIAGNOSIS — J029 Acute pharyngitis, unspecified: Secondary | ICD-10-CM | POA: Diagnosis not present

## 2016-01-09 DIAGNOSIS — Z923 Personal history of irradiation: Secondary | ICD-10-CM | POA: Diagnosis not present

## 2016-01-09 DIAGNOSIS — Z79899 Other long term (current) drug therapy: Secondary | ICD-10-CM | POA: Diagnosis not present

## 2016-01-09 DIAGNOSIS — C159 Malignant neoplasm of esophagus, unspecified: Secondary | ICD-10-CM | POA: Diagnosis not present

## 2016-01-09 DIAGNOSIS — C7951 Secondary malignant neoplasm of bone: Secondary | ICD-10-CM | POA: Diagnosis not present

## 2016-01-09 DIAGNOSIS — Z7982 Long term (current) use of aspirin: Secondary | ICD-10-CM | POA: Diagnosis not present

## 2016-01-09 DIAGNOSIS — Z510 Encounter for antineoplastic radiation therapy: Secondary | ICD-10-CM | POA: Diagnosis not present

## 2016-01-09 DIAGNOSIS — K5900 Constipation, unspecified: Secondary | ICD-10-CM | POA: Diagnosis not present

## 2016-01-10 ENCOUNTER — Ambulatory Visit
Admission: RE | Admit: 2016-01-10 | Discharge: 2016-01-10 | Disposition: A | Discharge status: Home / Self Care | Payer: Medicare Other | Source: Ambulatory Visit | Attending: Radiation Oncology | Admitting: Radiation Oncology

## 2016-01-10 ENCOUNTER — Ambulatory Visit (HOSPITAL_BASED_OUTPATIENT_CLINIC_OR_DEPARTMENT_OTHER): Payer: Medicare Other

## 2016-01-10 VITALS — BP 132/66 | HR 76 | Resp 18

## 2016-01-10 DIAGNOSIS — Z7982 Long term (current) use of aspirin: Secondary | ICD-10-CM | POA: Diagnosis not present

## 2016-01-10 DIAGNOSIS — C155 Malignant neoplasm of lower third of esophagus: Secondary | ICD-10-CM

## 2016-01-10 DIAGNOSIS — Z510 Encounter for antineoplastic radiation therapy: Secondary | ICD-10-CM | POA: Diagnosis not present

## 2016-01-10 DIAGNOSIS — C159 Malignant neoplasm of esophagus, unspecified: Secondary | ICD-10-CM | POA: Diagnosis not present

## 2016-01-10 DIAGNOSIS — Z79899 Other long term (current) drug therapy: Secondary | ICD-10-CM | POA: Diagnosis not present

## 2016-01-10 DIAGNOSIS — J029 Acute pharyngitis, unspecified: Secondary | ICD-10-CM | POA: Diagnosis not present

## 2016-01-10 DIAGNOSIS — C7951 Secondary malignant neoplasm of bone: Secondary | ICD-10-CM | POA: Diagnosis not present

## 2016-01-10 DIAGNOSIS — Z9221 Personal history of antineoplastic chemotherapy: Secondary | ICD-10-CM | POA: Diagnosis not present

## 2016-01-10 DIAGNOSIS — Z452 Encounter for adjustment and management of vascular access device: Secondary | ICD-10-CM | POA: Diagnosis not present

## 2016-01-10 DIAGNOSIS — K5900 Constipation, unspecified: Secondary | ICD-10-CM | POA: Diagnosis not present

## 2016-01-10 DIAGNOSIS — Z923 Personal history of irradiation: Secondary | ICD-10-CM | POA: Diagnosis not present

## 2016-01-10 MED ORDER — HEPARIN SOD (PORK) LOCK FLUSH 100 UNIT/ML IV SOLN
500.0000 [IU] | INTRAVENOUS | Status: AC | PRN
  Administered 2016-01-10: 500 [IU]
  Filled 2016-01-10: qty 5

## 2016-01-10 MED ORDER — SODIUM CHLORIDE 0.9 % IJ SOLN
10.0000 mL | INTRAMUSCULAR | Status: AC | PRN
  Administered 2016-01-10: 10 mL
  Filled 2016-01-10: qty 10

## 2016-01-10 NOTE — Patient Instructions (Signed)

## 2016-01-11 ENCOUNTER — Ambulatory Visit
Admission: RE | Admit: 2016-01-11 | Discharge: 2016-01-11 | Disposition: A | Discharge status: Home / Self Care | Payer: Medicare Other | Source: Ambulatory Visit | Attending: Radiation Oncology | Admitting: Radiation Oncology

## 2016-01-11 DIAGNOSIS — J029 Acute pharyngitis, unspecified: Secondary | ICD-10-CM | POA: Diagnosis not present

## 2016-01-11 DIAGNOSIS — C7951 Secondary malignant neoplasm of bone: Secondary | ICD-10-CM | POA: Diagnosis not present

## 2016-01-11 DIAGNOSIS — Z7982 Long term (current) use of aspirin: Secondary | ICD-10-CM | POA: Diagnosis not present

## 2016-01-11 DIAGNOSIS — Z79899 Other long term (current) drug therapy: Secondary | ICD-10-CM | POA: Diagnosis not present

## 2016-01-11 DIAGNOSIS — Z9221 Personal history of antineoplastic chemotherapy: Secondary | ICD-10-CM | POA: Diagnosis not present

## 2016-01-11 DIAGNOSIS — Z510 Encounter for antineoplastic radiation therapy: Secondary | ICD-10-CM | POA: Diagnosis not present

## 2016-01-11 DIAGNOSIS — C159 Malignant neoplasm of esophagus, unspecified: Secondary | ICD-10-CM | POA: Diagnosis not present

## 2016-01-11 DIAGNOSIS — K5900 Constipation, unspecified: Secondary | ICD-10-CM | POA: Diagnosis not present

## 2016-01-11 DIAGNOSIS — Z923 Personal history of irradiation: Secondary | ICD-10-CM | POA: Diagnosis not present

## 2016-01-14 ENCOUNTER — Ambulatory Visit
Admission: RE | Admit: 2016-01-14 | Discharge: 2016-01-14 | Disposition: A | Discharge status: Home / Self Care | Payer: Medicare Other | Source: Ambulatory Visit | Attending: Radiation Oncology | Admitting: Radiation Oncology

## 2016-01-14 DIAGNOSIS — Z510 Encounter for antineoplastic radiation therapy: Secondary | ICD-10-CM | POA: Diagnosis not present

## 2016-01-14 DIAGNOSIS — Z9221 Personal history of antineoplastic chemotherapy: Secondary | ICD-10-CM | POA: Diagnosis not present

## 2016-01-14 DIAGNOSIS — Z923 Personal history of irradiation: Secondary | ICD-10-CM | POA: Diagnosis not present

## 2016-01-14 DIAGNOSIS — C159 Malignant neoplasm of esophagus, unspecified: Secondary | ICD-10-CM | POA: Diagnosis not present

## 2016-01-14 DIAGNOSIS — C7951 Secondary malignant neoplasm of bone: Secondary | ICD-10-CM | POA: Diagnosis not present

## 2016-01-14 DIAGNOSIS — J029 Acute pharyngitis, unspecified: Secondary | ICD-10-CM | POA: Diagnosis not present

## 2016-01-14 DIAGNOSIS — Z79899 Other long term (current) drug therapy: Secondary | ICD-10-CM | POA: Diagnosis not present

## 2016-01-14 DIAGNOSIS — Z7982 Long term (current) use of aspirin: Secondary | ICD-10-CM | POA: Diagnosis not present

## 2016-01-14 DIAGNOSIS — K5900 Constipation, unspecified: Secondary | ICD-10-CM | POA: Diagnosis not present

## 2016-01-15 ENCOUNTER — Encounter: Payer: Self-pay | Admitting: Radiation Oncology

## 2016-01-15 ENCOUNTER — Ambulatory Visit
Admission: RE | Admit: 2016-01-15 | Discharge: 2016-01-15 | Disposition: A | Discharge status: Home / Self Care | Payer: Medicare Other | Source: Ambulatory Visit | Attending: Radiation Oncology | Admitting: Radiation Oncology

## 2016-01-15 VITALS — BP 117/78 | HR 82 | Temp 98.4°F | Ht 68.0 in | Wt 135.3 lb

## 2016-01-15 DIAGNOSIS — K5900 Constipation, unspecified: Secondary | ICD-10-CM | POA: Diagnosis not present

## 2016-01-15 DIAGNOSIS — J029 Acute pharyngitis, unspecified: Secondary | ICD-10-CM | POA: Diagnosis not present

## 2016-01-15 DIAGNOSIS — Z7982 Long term (current) use of aspirin: Secondary | ICD-10-CM | POA: Diagnosis not present

## 2016-01-15 DIAGNOSIS — Z79899 Other long term (current) drug therapy: Secondary | ICD-10-CM | POA: Diagnosis not present

## 2016-01-15 DIAGNOSIS — Z510 Encounter for antineoplastic radiation therapy: Secondary | ICD-10-CM | POA: Diagnosis not present

## 2016-01-15 DIAGNOSIS — C159 Malignant neoplasm of esophagus, unspecified: Secondary | ICD-10-CM | POA: Diagnosis not present

## 2016-01-15 DIAGNOSIS — C7951 Secondary malignant neoplasm of bone: Secondary | ICD-10-CM | POA: Diagnosis not present

## 2016-01-15 DIAGNOSIS — Z923 Personal history of irradiation: Secondary | ICD-10-CM | POA: Diagnosis not present

## 2016-01-15 DIAGNOSIS — Z9221 Personal history of antineoplastic chemotherapy: Secondary | ICD-10-CM | POA: Diagnosis not present

## 2016-01-15 NOTE — Progress Notes (Signed)
Andre Guzman has completed 9 fractions to his right pelvis.  He denies having pain.  He continues to report having pain when he first gets up in the morning or after sitting that improves with walking. He denies having any new bladder/bowel issues, skin irritation or fatigue.  BP 117/78 (BP Location: Left Arm, Patient Position: Sitting)   Pulse 82   Temp 98.4 F (36.9 C) (Oral)   Ht 5\' 8"  (1.727 m)   Wt 135 lb 4.8 oz (61.4 kg)   SpO2 100%   BMI 20.57 kg/m    Wt Readings from Last 3 Encounters:  01/15/16 135 lb 4.8 oz (61.4 kg)  01/08/16 134 lb 3.2 oz (60.9 kg)  01/07/16 133 lb 6.4 oz (60.5 kg)

## 2016-01-15 NOTE — Progress Notes (Signed)
  Radiation Oncology         [phone] ________________________________  Name: Andre Guzman MRN: [id number]  Date: 01/15/2016  DOB: Jun 18, 1945  Weekly Radiation Therapy Management    ICD-9-CM ICD-10-CM   1. Squamous cell esophageal cancer (HCC) 150.9 C15.9         Stage IV (TxNxM1) squamous cell carcinoma of the esophagus with metastatic bone disease    Current Dose: 23.5 Gy     Planned Dose:  37.5 Gy  Narrative . . . . . . . . The patient presents for routine under treatment assessment.                                                                    Set-up films were reviewed.                                 The chart was checked. Andre Guzman has has completed 9 fractions to his right pelvis.  He denies having pain.  He continues to report having pain when he first gets up in the morning or after sitting that improves with walking. He denies having any new bladder/bowel issues, skin irritation or fatigue  Physical Findings. . .  height is 5\' 8"  (1.727 m) and weight is 135 lb 4.8 oz (61.4 kg). His oral temperature is 98.4 F (36.9 C). His blood pressure is 117/78 and his pulse is 82. His oxygen saturation is 100%. . Weight essentially stable.  No significant changes. Lungs are clear to auscultation bilaterally. Heart has regular rate and rhythm. No palpable cervical, supraclavicular, or axillary adenopathy. Abdomen soft, non-tender, normal bowel sounds.  Impression . . . . . . . The patient is tolerating radiation. Plan . . . . . . . . . . . . Continue treatment as planned.  ________________________________   Blair Promise, PhD, MD  This document serves as a record of services personally performed by Gery Pray, MD. It was created on his behalf by Truddie Hidden, a trained medical scribe. The creation of this record is based on the scribe's personal observations and the provider's statements to them. This document has been checked and approved by the attending  provider.

## 2016-01-16 ENCOUNTER — Ambulatory Visit
Admission: RE | Admit: 2016-01-16 | Discharge: 2016-01-16 | Disposition: A | Discharge status: Home / Self Care | Payer: Medicare Other | Source: Ambulatory Visit | Attending: Radiation Oncology | Admitting: Radiation Oncology

## 2016-01-16 DIAGNOSIS — C7951 Secondary malignant neoplasm of bone: Secondary | ICD-10-CM | POA: Diagnosis not present

## 2016-01-16 DIAGNOSIS — Z510 Encounter for antineoplastic radiation therapy: Secondary | ICD-10-CM | POA: Diagnosis not present

## 2016-01-16 DIAGNOSIS — J029 Acute pharyngitis, unspecified: Secondary | ICD-10-CM | POA: Diagnosis not present

## 2016-01-16 DIAGNOSIS — Z79899 Other long term (current) drug therapy: Secondary | ICD-10-CM | POA: Diagnosis not present

## 2016-01-16 DIAGNOSIS — Z7982 Long term (current) use of aspirin: Secondary | ICD-10-CM | POA: Diagnosis not present

## 2016-01-16 DIAGNOSIS — K5900 Constipation, unspecified: Secondary | ICD-10-CM | POA: Diagnosis not present

## 2016-01-16 DIAGNOSIS — Z9221 Personal history of antineoplastic chemotherapy: Secondary | ICD-10-CM | POA: Diagnosis not present

## 2016-01-16 DIAGNOSIS — C159 Malignant neoplasm of esophagus, unspecified: Secondary | ICD-10-CM | POA: Diagnosis not present

## 2016-01-16 DIAGNOSIS — Z923 Personal history of irradiation: Secondary | ICD-10-CM | POA: Diagnosis not present

## 2016-01-17 ENCOUNTER — Ambulatory Visit
Admission: RE | Admit: 2016-01-17 | Discharge: 2016-01-17 | Disposition: A | Discharge status: Home / Self Care | Payer: Medicare Other | Source: Ambulatory Visit | Attending: Radiation Oncology | Admitting: Radiation Oncology

## 2016-01-17 ENCOUNTER — Telehealth: Payer: Self-pay | Admitting: Oncology

## 2016-01-17 DIAGNOSIS — Z923 Personal history of irradiation: Secondary | ICD-10-CM | POA: Diagnosis not present

## 2016-01-17 DIAGNOSIS — Z7982 Long term (current) use of aspirin: Secondary | ICD-10-CM | POA: Diagnosis not present

## 2016-01-17 DIAGNOSIS — J029 Acute pharyngitis, unspecified: Secondary | ICD-10-CM | POA: Diagnosis not present

## 2016-01-17 DIAGNOSIS — Z510 Encounter for antineoplastic radiation therapy: Secondary | ICD-10-CM | POA: Diagnosis not present

## 2016-01-17 DIAGNOSIS — C159 Malignant neoplasm of esophagus, unspecified: Secondary | ICD-10-CM | POA: Diagnosis not present

## 2016-01-17 DIAGNOSIS — Z79899 Other long term (current) drug therapy: Secondary | ICD-10-CM | POA: Diagnosis not present

## 2016-01-17 DIAGNOSIS — Z9221 Personal history of antineoplastic chemotherapy: Secondary | ICD-10-CM | POA: Diagnosis not present

## 2016-01-17 DIAGNOSIS — C7951 Secondary malignant neoplasm of bone: Secondary | ICD-10-CM | POA: Diagnosis not present

## 2016-01-17 DIAGNOSIS — K5900 Constipation, unspecified: Secondary | ICD-10-CM | POA: Diagnosis not present

## 2016-01-17 MED FILL — OSELTAMIVIR PHOS 75 MG CAP: 75 | 7 days supply | Qty: 7 | Fill #0

## 2016-01-17 NOTE — Progress Notes (Signed)
Mr. Schroeter escorted to nursing to clarify why he needed Tamiflu.  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 Joanna Puff Medical Director. Stated our goal is to decrease his chances of contracting the flu.  Elmo Putt, RN had a prior conversation with his spouse today at 8:58am. He stated understanding.  Requested he bring the receipt so that he can be reimbursed.

## 2016-01-17 NOTE — Telephone Encounter (Signed)
Called Andre Guzman and talked to his wife, [name].  Advised her that [name] has been exposed to the flu and that a prescription for Tamiflu to take for 7 days will be sent to his pharmacy.  [name] verbalized agreement and understanding.  Prescription called in to [facility].

## 2016-01-18 ENCOUNTER — Ambulatory Visit
Admission: RE | Admit: 2016-01-18 | Discharge: 2016-01-18 | Disposition: A | Discharge status: Home / Self Care | Payer: Medicare Other | Source: Ambulatory Visit | Attending: Radiation Oncology | Admitting: Radiation Oncology

## 2016-01-18 DIAGNOSIS — Z9221 Personal history of antineoplastic chemotherapy: Secondary | ICD-10-CM | POA: Diagnosis not present

## 2016-01-18 DIAGNOSIS — C7951 Secondary malignant neoplasm of bone: Secondary | ICD-10-CM | POA: Diagnosis not present

## 2016-01-18 DIAGNOSIS — J029 Acute pharyngitis, unspecified: Secondary | ICD-10-CM | POA: Diagnosis not present

## 2016-01-18 DIAGNOSIS — K5900 Constipation, unspecified: Secondary | ICD-10-CM | POA: Diagnosis not present

## 2016-01-18 DIAGNOSIS — Z7982 Long term (current) use of aspirin: Secondary | ICD-10-CM | POA: Diagnosis not present

## 2016-01-18 DIAGNOSIS — C159 Malignant neoplasm of esophagus, unspecified: Secondary | ICD-10-CM | POA: Diagnosis not present

## 2016-01-18 DIAGNOSIS — Z923 Personal history of irradiation: Secondary | ICD-10-CM | POA: Diagnosis not present

## 2016-01-18 DIAGNOSIS — Z79899 Other long term (current) drug therapy: Secondary | ICD-10-CM | POA: Diagnosis not present

## 2016-01-18 DIAGNOSIS — Z510 Encounter for antineoplastic radiation therapy: Secondary | ICD-10-CM | POA: Diagnosis not present

## 2016-01-22 ENCOUNTER — Ambulatory Visit
Admission: RE | Admit: 2016-01-22 | Discharge: 2016-01-22 | Disposition: A | Discharge status: Home / Self Care | Payer: Medicare Other | Source: Ambulatory Visit | Attending: Radiation Oncology | Admitting: Radiation Oncology

## 2016-01-22 ENCOUNTER — Encounter: Payer: Self-pay | Admitting: Radiation Oncology

## 2016-01-22 VITALS — BP 129/77 | HR 95 | Temp 98.4°F | Ht 68.0 in | Wt 132.8 lb

## 2016-01-22 DIAGNOSIS — C159 Malignant neoplasm of esophagus, unspecified: Secondary | ICD-10-CM

## 2016-01-22 DIAGNOSIS — Z7982 Long term (current) use of aspirin: Secondary | ICD-10-CM | POA: Diagnosis not present

## 2016-01-22 DIAGNOSIS — Z79899 Other long term (current) drug therapy: Secondary | ICD-10-CM | POA: Diagnosis not present

## 2016-01-22 DIAGNOSIS — Z510 Encounter for antineoplastic radiation therapy: Secondary | ICD-10-CM | POA: Diagnosis not present

## 2016-01-22 DIAGNOSIS — C7951 Secondary malignant neoplasm of bone: Secondary | ICD-10-CM | POA: Diagnosis not present

## 2016-01-22 DIAGNOSIS — Z923 Personal history of irradiation: Secondary | ICD-10-CM | POA: Diagnosis not present

## 2016-01-22 DIAGNOSIS — J029 Acute pharyngitis, unspecified: Secondary | ICD-10-CM | POA: Diagnosis not present

## 2016-01-22 DIAGNOSIS — K5900 Constipation, unspecified: Secondary | ICD-10-CM | POA: Diagnosis not present

## 2016-01-22 DIAGNOSIS — Z9221 Personal history of antineoplastic chemotherapy: Secondary | ICD-10-CM | POA: Diagnosis not present

## 2016-01-22 NOTE — Progress Notes (Signed)
Andre Guzman has completed 13 fractions to his right pelvis.  He denies having pain and said he is still having some pain in his right hip after sitting.  He reports having darker urine and a poor appetite.  He reports having slight fatigue.  He has been given a one month follow appointment.  BP 129/77 (BP Location: Left Arm, Patient Position: Sitting)   Pulse 95   Temp 98.4 F (36.9 C) (Oral)   Ht 5\' 8"  (1.727 m)   Wt 132 lb 12.8 oz (60.2 kg)   SpO2 100%   BMI 20.19 kg/m    Wt Readings from Last 3 Encounters:  01/22/16 132 lb 12.8 oz (60.2 kg)  01/15/16 135 lb 4.8 oz (61.4 kg)  01/08/16 134 lb 3.2 oz (60.9 kg)

## 2016-01-22 NOTE — Progress Notes (Signed)
  Radiation Oncology         [phone] ________________________________  Name: Andre Guzman MRN: [id number]  Date: 01/22/2016  DOB: [birth year]  Weekly Radiation Therapy Management    ICD-9-CM ICD-10-CM   1. 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 . . . . . . . The patient presents for routine under treatment assessment.                             The patient has completed 13 fractions to his right pelvis.  He reports some pain in his right hip after sitting.  He reports having darker urine and a poor appetite.  He denies hematuria.  He reports having slight fatigue.  He has been given a one month follow appointment by the nurse.                                 Set-up films were reviewed.                                 The chart was checked. Physical Findings. . .  height is 5\' 8"  (1.727 m) and weight is 132 lb 12.8 oz (60.2 kg). His oral temperature is 98.4 F (36.9 C). His blood pressure is 129/77 and his pulse is 95. His oxygen saturation is 100%.   The patient's weight has been relatively stable since late July. Lungs are clear to auscultation bilaterally. Heart has regular rate and rhythm. Abdomen soft, non-tender, normal bowel sound. Oral cavity clear with no sign of infection. Impression . . . . . . . The patient is tolerating radiation. Plan . . . . . . . . . . . . The patient will continue treatment as planned and will follow up in a month. I encouraged the patient to drink more water and to let us know if he develops urinary symptoms. ________________________________   Blair Promise, PhD, MD  This document serves as a record of services personally performed by Gery Pray, MD. It was created on his behalf by Darcus Austin, a trained medical scribe. The creation of this record is based on the scribe's  personal observations and the provider's statements to them. This document has been checked and approved by the attending provider.

## 2016-01-23 ENCOUNTER — Ambulatory Visit
Admission: RE | Admit: 2016-01-23 | Discharge: 2016-01-23 | Disposition: A | Discharge status: Home / Self Care | Payer: Medicare Other | Source: Ambulatory Visit | Attending: Radiation Oncology | Admitting: Radiation Oncology

## 2016-01-23 DIAGNOSIS — K5900 Constipation, unspecified: Secondary | ICD-10-CM | POA: Diagnosis not present

## 2016-01-23 DIAGNOSIS — Z510 Encounter for antineoplastic radiation therapy: Secondary | ICD-10-CM | POA: Diagnosis not present

## 2016-01-23 DIAGNOSIS — C7951 Secondary malignant neoplasm of bone: Secondary | ICD-10-CM | POA: Diagnosis not present

## 2016-01-23 DIAGNOSIS — J029 Acute pharyngitis, unspecified: Secondary | ICD-10-CM | POA: Diagnosis not present

## 2016-01-23 DIAGNOSIS — Z9221 Personal history of antineoplastic chemotherapy: Secondary | ICD-10-CM | POA: Diagnosis not present

## 2016-01-23 DIAGNOSIS — Z79899 Other long term (current) drug therapy: Secondary | ICD-10-CM | POA: Diagnosis not present

## 2016-01-23 DIAGNOSIS — Z7982 Long term (current) use of aspirin: Secondary | ICD-10-CM | POA: Diagnosis not present

## 2016-01-23 DIAGNOSIS — C159 Malignant neoplasm of esophagus, unspecified: Secondary | ICD-10-CM | POA: Diagnosis not present

## 2016-01-23 DIAGNOSIS — Z923 Personal history of irradiation: Secondary | ICD-10-CM | POA: Diagnosis not present

## 2016-01-24 ENCOUNTER — Ambulatory Visit
Admission: RE | Admit: 2016-01-24 | Discharge: 2016-01-24 | Disposition: A | Discharge status: Home / Self Care | Payer: Medicare Other | Source: Ambulatory Visit | Attending: Radiation Oncology | Admitting: Radiation Oncology

## 2016-01-24 ENCOUNTER — Encounter: Payer: Self-pay | Admitting: Radiation Oncology

## 2016-01-24 DIAGNOSIS — Z510 Encounter for antineoplastic radiation therapy: Secondary | ICD-10-CM | POA: Diagnosis not present

## 2016-01-24 DIAGNOSIS — K5900 Constipation, unspecified: Secondary | ICD-10-CM | POA: Diagnosis not present

## 2016-01-24 DIAGNOSIS — Z7982 Long term (current) use of aspirin: Secondary | ICD-10-CM | POA: Diagnosis not present

## 2016-01-24 DIAGNOSIS — Z9221 Personal history of antineoplastic chemotherapy: Secondary | ICD-10-CM | POA: Diagnosis not present

## 2016-01-24 DIAGNOSIS — J029 Acute pharyngitis, unspecified: Secondary | ICD-10-CM | POA: Diagnosis not present

## 2016-01-24 DIAGNOSIS — C7951 Secondary malignant neoplasm of bone: Secondary | ICD-10-CM | POA: Diagnosis not present

## 2016-01-24 DIAGNOSIS — Z923 Personal history of irradiation: Secondary | ICD-10-CM | POA: Diagnosis not present

## 2016-01-24 DIAGNOSIS — C159 Malignant neoplasm of esophagus, unspecified: Secondary | ICD-10-CM | POA: Diagnosis not present

## 2016-01-24 DIAGNOSIS — Z79899 Other long term (current) drug therapy: Secondary | ICD-10-CM | POA: Diagnosis not present

## 2016-01-28 ENCOUNTER — Other Ambulatory Visit (HOSPITAL_BASED_OUTPATIENT_CLINIC_OR_DEPARTMENT_OTHER): Payer: Medicare Other

## 2016-01-28 ENCOUNTER — Encounter: Payer: Self-pay | Admitting: *Deleted

## 2016-01-28 ENCOUNTER — Encounter: Payer: Self-pay | Admitting: Hematology

## 2016-01-28 ENCOUNTER — Ambulatory Visit (HOSPITAL_BASED_OUTPATIENT_CLINIC_OR_DEPARTMENT_OTHER): Payer: Medicare Other | Admitting: Hematology

## 2016-01-28 ENCOUNTER — Ambulatory Visit: Payer: Medicare Other

## 2016-01-28 ENCOUNTER — Ambulatory Visit (HOSPITAL_BASED_OUTPATIENT_CLINIC_OR_DEPARTMENT_OTHER): Payer: Medicare Other

## 2016-01-28 VITALS — BP 116/61 | HR 91 | Temp 98.3°F | Resp 17 | Ht 68.0 in | Wt 131.4 lb

## 2016-01-28 DIAGNOSIS — C155 Malignant neoplasm of lower third of esophagus: Secondary | ICD-10-CM | POA: Diagnosis not present

## 2016-01-28 DIAGNOSIS — I10 Essential (primary) hypertension: Secondary | ICD-10-CM

## 2016-01-28 DIAGNOSIS — D6481 Anemia due to antineoplastic chemotherapy: Secondary | ICD-10-CM

## 2016-01-28 DIAGNOSIS — D630 Anemia in neoplastic disease: Secondary | ICD-10-CM

## 2016-01-28 DIAGNOSIS — Z79899 Other long term (current) drug therapy: Secondary | ICD-10-CM | POA: Diagnosis not present

## 2016-01-28 DIAGNOSIS — E46 Unspecified protein-calorie malnutrition: Secondary | ICD-10-CM

## 2016-01-28 DIAGNOSIS — C159 Malignant neoplasm of esophagus, unspecified: Secondary | ICD-10-CM

## 2016-01-28 DIAGNOSIS — C787 Secondary malignant neoplasm of liver and intrahepatic bile duct: Secondary | ICD-10-CM

## 2016-01-28 DIAGNOSIS — I2510 Atherosclerotic heart disease of native coronary artery without angina pectoris: Secondary | ICD-10-CM

## 2016-01-28 DIAGNOSIS — Z95828 Presence of other vascular implants and grafts: Secondary | ICD-10-CM

## 2016-01-28 DIAGNOSIS — C7951 Secondary malignant neoplasm of bone: Secondary | ICD-10-CM

## 2016-01-28 DIAGNOSIS — C7800 Secondary malignant neoplasm of unspecified lung: Secondary | ICD-10-CM

## 2016-01-28 DIAGNOSIS — Z5112 Encounter for antineoplastic immunotherapy: Secondary | ICD-10-CM | POA: Diagnosis not present

## 2016-01-28 LAB — CBC & DIFF AND RETIC
BASO%: 0 % (ref 0.0–2.0)
Basophils Absolute: 0 10*3/uL (ref 0.0–0.1)
EOS%: 0.6 % (ref 0.0–7.0)
Eosinophils Absolute: 0 10*3/uL (ref 0.0–0.5)
HCT: 25.3 % — ABNORMAL LOW (ref 38.4–49.9)
HGB: 8.7 g/dL — ABNORMAL LOW (ref 13.0–17.1)
Immature Retic Fract: 11.4 % — ABNORMAL HIGH (ref 3.00–10.60)
LYMPH%: 8.2 % — AB (ref 14.0–49.0)
MCH: 32.5 pg (ref 27.2–33.4)
MCHC: 34.4 g/dL (ref 32.0–36.0)
MCV: 94.4 fL (ref 79.3–98.0)
MONO#: 0.7 10*3/uL (ref 0.1–0.9)
MONO%: 13.9 % (ref 0.0–14.0)
NEUT%: 77.3 % — AB (ref 39.0–75.0)
NEUTROS ABS: 4 10*3/uL (ref 1.5–6.5)
PLATELETS: 137 10*3/uL — AB (ref 140–400)
RBC: 2.68 10*6/uL — AB (ref 4.20–5.82)
RDW: 14.5 % (ref 11.0–14.6)
Retic %: 1.15 % (ref 0.80–1.80)
Retic Ct Abs: 30.82 10*3/uL — ABNORMAL LOW (ref 34.80–93.90)
WBC: 5.1 10*3/uL (ref 4.0–10.3)
lymph#: 0.4 10*3/uL — ABNORMAL LOW (ref 0.9–3.3)

## 2016-01-28 LAB — COMPREHENSIVE METABOLIC PANEL
ALK PHOS: 456 U/L — AB (ref 40–150)
ALT: 11 U/L (ref 0–55)
ANION GAP: 10 meq/L (ref 3–11)
AST: 24 U/L (ref 5–34)
Albumin: 2.4 g/dL — ABNORMAL LOW (ref 3.5–5.0)
BILIRUBIN TOTAL: 0.78 mg/dL (ref 0.20–1.20)
BUN: 11.3 mg/dL (ref 7.0–26.0)
CO2: 27 meq/L (ref 22–29)
Calcium: 9 mg/dL (ref 8.4–10.4)
Chloride: 101 mEq/L (ref 98–109)
Creatinine: 0.8 mg/dL (ref 0.7–1.3)
GLUCOSE: 119 mg/dL (ref 70–140)
POTASSIUM: 4 meq/L (ref 3.5–5.1)
SODIUM: 138 meq/L (ref 136–145)
Total Protein: 8 g/dL (ref 6.4–8.3)

## 2016-01-28 MED ORDER — SODIUM CHLORIDE 0.9 % IV SOLN
Freq: Once | INTRAVENOUS | Status: AC
  Administered 2016-01-28: 15:00:00 via INTRAVENOUS

## 2016-01-28 MED ORDER — SODIUM CHLORIDE 0.9 % IJ SOLN
10.0000 mL | INTRAMUSCULAR | Status: DC | PRN
  Administered 2016-01-28: 10 mL via INTRAVENOUS
  Filled 2016-01-28: qty 10

## 2016-01-28 MED ORDER — PEMBROLIZUMAB CHEMO INJECTION 100 MG/4ML
200.0000 mg | Freq: Once | INTRAVENOUS | Status: AC
  Administered 2016-01-28: 200 mg via INTRAVENOUS
  Filled 2016-01-28: qty 4

## 2016-01-28 MED ORDER — SODIUM CHLORIDE 0.9% FLUSH
10.0000 mL | INTRAVENOUS | Status: DC | PRN
Start: 2016-01-28 — End: 2016-01-28
  Filled 2016-01-28: qty 10

## 2016-01-28 MED ORDER — HEPARIN SOD (PORK) LOCK FLUSH 100 UNIT/ML IV SOLN
500.0000 [IU] | Freq: Once | INTRAVENOUS | Status: DC | PRN
  Filled 2016-01-28: qty 5

## 2016-01-28 NOTE — Patient Instructions (Addendum)
[facility] Discharge Instructions for Patients Receiving Chemotherapy  Today you received the following chemotherapy agent: Keytruda  To help prevent nausea and vomiting after your treatment, we encourage you to take your nausea medication as prescribed.   If you develop nausea and vomiting that is not controlled by your nausea medication, call the clinic.   BELOW ARE SYMPTOMS THAT SHOULD BE REPORTED IMMEDIATELY:  *FEVER GREATER THAN 100.5 F  *CHILLS WITH OR WITHOUT FEVER  NAUSEA AND VOMITING THAT IS NOT CONTROLLED WITH YOUR NAUSEA MEDICATION  *UNUSUAL SHORTNESS OF BREATH  *UNUSUAL BRUISING OR BLEEDING  TENDERNESS IN MOUTH AND THROAT WITH OR WITHOUT PRESENCE OF ULCERS  *URINARY PROBLEMS  *BOWEL PROBLEMS  UNUSUAL RASH Items with * indicate a potential emergency and should be followed up as soon as possible.  Feel free to call the clinic you have any questions or concerns. The clinic phone number is [phone].  Please show the [place] at check-in to the Emergency Department and triage nurse.    Beryle Flock) Pembrolizumab injection What is this medicine? PEMBROLIZUMAB (pem broe liz ue mab) is a monoclonal antibody. It is used to treat melanoma and non-small cell lung cancer. This medicine may be used for other purposes; ask your health care provider or pharmacist if you have questions. What should I tell my health care provider before I take this medicine? They need to know if you have any of these conditions: -diabetes -immune system problems -inflammatory bowel disease -liver disease -lung or breathing disease -lupus -an unusual or allergic reaction to pembrolizumab, other medicines, foods, dyes, or preservatives -pregnant or trying to get pregnant -breast-feeding How should I use this medicine? This medicine is for infusion into a vein. It is given by a health care professional in a hospital or clinic setting. A special MedGuide will be  given to you before each treatment. Be sure to read this information carefully each time. Talk to your pediatrician regarding the use of this medicine in children. Special care may be needed. Overdosage: If you think you have taken too much of this medicine contact a poison control center or emergency room at once. NOTE: This medicine is only for you. Do not share this medicine with others. What if I miss a dose? It is important not to miss your dose. Call your doctor or health care professional if you are unable to keep an appointment. What may interact with this medicine? Interactions have not been studied. Give your health care provider a list of all the medicines, herbs, non-prescription drugs, or dietary supplements you use. Also tell them if you smoke, drink alcohol, or use illegal drugs. Some items may interact with your medicine. This list may not describe all possible interactions. Give your health care provider a list of all the medicines, herbs, non-prescription drugs, or dietary supplements you use. Also tell them if you smoke, drink alcohol, or use illegal drugs. Some items may interact with your medicine. What should I watch for while using this medicine? Your condition will be monitored carefully while you are receiving this medicine. You may need blood work done while you are taking this medicine. Do not become pregnant while taking this medicine or for 4 months after stopping it. Women should inform their doctor if they wish to become pregnant or think they might be pregnant. There is a potential for serious side effects to an unborn child. Talk to your health care professional or pharmacist for more information. Do not breast-feed  an infant while taking this medicine or for 4 months after the last dose. What side effects may I notice from receiving this medicine? Side effects that you should report to your doctor or health care professional as soon as possible: -allergic reactions  like skin rash, itching or hives, swelling of the face, lips, or tongue -bloody or black, tarry stools -breathing problems -change in the amount of urine -changes in vision -chest pain -chills -dark urine -dizziness or feeling faint or lightheaded -fast or irregular heartbeat -fever -flushing -hair loss -muscle pain -muscle weakness -persistent headache -signs and symptoms of high blood sugar such as dizziness; dry mouth; dry skin; fruity breath; nausea; stomach pain; increased hunger or thirst; increased urination -signs and symptoms of liver injury like dark urine, light-colored stools, loss of appetite, nausea, right upper belly pain, yellowing of the eyes or skin -stomach pain -weight loss Side effects that usually do not require medical attention (Report these to your doctor or health care professional if they continue or are bothersome.):constipation -cough -diarrhea -joint pain -tiredness This list may not describe all possible side effects. Call your doctor for medical advice about side effects. You may report side effects to FDA at 1-800-FDA-1088. Where should I keep my medicine? This drug is given in a hospital or clinic and will not be stored at home. NOTE: This sheet is a summary. It may not cover all possible information. If you have questions about this medicine, talk to your doctor, pharmacist, or health care provider.    2016, Elsevier/Gold Standard. (2014-07-04 17:24:19)

## 2016-01-28 NOTE — Progress Notes (Signed)
Oncology Nurse Navigator Documentation  Oncology Nurse Navigator Flowsheets 01/28/2016  Navigator Location [facility]  Navigator Encounter Type Letter/Fax/Email;Telephone  Patient Visit Type -  Treatment Phase -  Barriers/Navigation Needs -  Interventions Call to [facility]: Tissue requested from [place] on 01/22/16 (stored off site according to [facility]) Call to [facility]: tissue received from warehouse on 9/9 and being sent out today to [facility]  Referrals -  Support Groups/Services -  [facility] -  Time Spent with Patient -

## 2016-01-28 NOTE — Progress Notes (Signed)
[facility]  [phone] [phone]  Clinic Follow up Note   Patient Care Team: Glendale Chard, MD as PCP - General (Internal Medicine) Adrian Prows, MD as Consulting Physician (Cardiology) Jovita Gamma, MD as Consulting Physician (Neurosurgery) Lowella Bandy, MD as Consulting Physician (Urology) Grace Isaac, MD as Consulting Physician (Cardiothoracic Surgery) Truitt Merle, MD as Consulting Physician (Hematology) Tania Ade, RN as Registered Nurse Carol Ada, MD as Consulting Physician (Gastroenterology) Gery Pray, MD as Consulting Physician (Radiation Oncology) 01/28/2016  CHIEF COMPLAINTS:  Follow up esophageal squamous cell carcinoma  Oncology History   Squamous cell esophageal cancer   Staging form: Esophagus - Squamous Cell Carcinoma, AJCC 7th Edition     Clinical: Stage IV (TX, N0, M1) - Unsigned       Squamous cell esophageal cancer ([place])   01/02/2015 Initial Diagnosis    Squamous cell esophageal cancer      01/02/2015 Procedure    EGD by Dr. Benson Norway showed a large, fungating mass with no bleeding and with stigmata met of recent bleeding was found in the third of the esophagus. The mass was completely obstructing and circumferential. The stomach and examined duodenum were normal.      01/02/2015 Imaging    CT CAP showed 9 cm segment of irregular his social worker thickening. Single enlarged subcarinal lymph node, bilateral pulmonary nodules (3), 4m right middle lobe next to right atrium, 3 RUL and a 7 mm lingular lung nodules.       01/02/2015 Initial Biopsy    Esophageal mass biopsy showed poorly differentiated squamous cell carcinoma.      01/18/2015 Imaging    PET scan showed hypermetabolic esophageal mass, subcarinal and possible right hilar adenopathy, several lung nodules, and a hypermetabolic bone lesion in right ilium, compatible with metastatic disease.       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      08/09/2015 -  Chemotherapy    second line chemo Carboplatin AUC 5 and paclitaxel 1 75 mg/m, every 3 weeks      10/04/2015 Imaging    PET scan showed interval response to therapy, decreased in hypermetabolism of bilateral pulmonary metastases and bone metastasis, no new lesions.      12/20/2015 Imaging    Restaging PET showed interval disease progression in lung and bone, newly developed multiple liver metastasis,      01/03/2016 - 01/24/2016 Radiation Therapy    Palliative radiation to right pelvic bone metastasis for pain control       HISTORY OF PRESENTING ILLNESS:  Andre [id number]. Guzman is here because of recently diagnosed esophageal squamous cell carcinoma.  He has had dysphagia for one month, with solid food mainly, he is able to keep liquid and soft diet down. He lost about 30 lbs in the the past month, but his weight has been stable in the past week since he started taking boost. He had some chest pain when he swallows. He is constipated, on stool softener, no bleeding.  He  otherwise feels fine. His energy level is fair, he functions very well, no limitation on his physical activities. No nausea, no other pain, cough or other, he drinks about 2 boost a day, soup,and some soft diet.  He was referred to gastroenterologist Dr. Benson Norway by his primary care physician. He underwent EGD which showed a circumferential, obstructing, fungating mass in the third esophagus, biopsy showed poorly differentiated squamous cell carcinoma. CT scan chest abdomen and pelvis showed 3 lung nodules, with  the largest 1.2 cm in the right middle lobe next to the right atrium.  CURRENT THERAPY: Keytruda '240mg'$  every 3 weeks, starting 01/28/2016  INTERIM HISTORY Andre Guzman returns for follow up. He completed palliative radiation to the pelvic bone metastasis last Thursday. He tolerated radiation very well, no significant fatigue or other side effects. He states his pain has overall improved some, is mild and tolerable. He is able to ambulate at home, but he does not go out very often. He has mild to moderate numbness and tingling on his fingers, has some difficulty eating some hand functions, such as buttoning. His appetite and eating is moderate, his weight is stable. No other new complaints.  MEDICAL HISTORY:  Past Medical History:  Diagnosis Date  . COPD (chronic obstructive pulmonary disease) ([place])   . Coronary artery disease   . Esophageal cancer ([place])   . Hypertension   . Pneumonia   . Radiation 01/25/15-02/13/15   35 gray for esophageal cancer  . Seizures ([place])   . Shortness of breath   . Stroke [facility])     SURGICAL HISTORY: Past Surgical History:  Procedure Laterality Date  . CARDIAC CATHETERIZATION    . CARDIAC VALVE SURGERY    . CORONARY ANGIOPLASTY WITH STENT PLACEMENT  07/22/2011  . CORONARY ARTERY BYPASS GRAFT    . ESOPHAGOGASTRODUODENOSCOPY  01/02/15  . LEFT HEART CATHETERIZATION WITH CORONARY ANGIOGRAM N/A 07/01/2011   Procedure: LEFT HEART CATHETERIZATION WITH CORONARY ANGIOGRAM;  Surgeon: Laverda Page, MD;  Location: [facility] CATH LAB;  Service: Cardiovascular;  Laterality: N/A;  . LEFT HEART CATHETERIZATION WITH CORONARY ANGIOGRAM N/A 10/30/2011   Procedure: LEFT HEART CATHETERIZATION WITH CORONARY ANGIOGRAM;  Surgeon: Laverda Page, MD;  Location[facility] CATH LAB;  Service: Cardiovascular;  Laterality: N/A;  . LOWER EXTREMITY ANGIOGRAM N/A 07/01/2011   Procedure: LOWER EXTREMITY ANGIOGRAM;  Surgeon: Laverda Page, MD;  Location: [facility] CATH LAB;  Service: Cardiovascular;  Laterality:  N/A;  . PERCUTANEOUS CORONARY ROTOBLATOR INTERVENTION (PCI-R) N/A 07/22/2011   Procedure: PERCUTANEOUS CORONARY ROTOBLATOR INTERVENTION (PCI-R);  Surgeon: Laverda Page, MD;  Location: [facility] CATH LAB;  Service: Cardiovascular;  Laterality: N/A;    SOCIAL HISTORY: Social History   Social History  . Marital Status: Married, lives with his wife     Spouse Name: N/A  . Number of Children: 84, 1 daughter and 3 sons, all lives in [place]   . Years of Education: N/A   Occupational History  . Retired Nature conservation officer    Social History Main Topics  . Smoking status: Current Every Day Smoker -- 0.50 packs/day for 50 years    Types: Cigarettes  . Smokeless tobacco: Never Used  . 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 Drug Use: No  . Sexual Activity: Not on file     Comment: did not ask   Other Topics Concern  . Not on file   Social History Narrative    FAMILY  HISTORY: Family History  Problem Relation Age of Onset  . Heart disease Mother   . Hypertension Mother   . Heart disease Sister   . Hypertension Sister   . Heart disease Brother   . Hypertension Daughter     ALLERGIES:  has No Known Allergies.  MEDICATIONS:  Current Outpatient Prescriptions  Medication Sig Dispense Refill  . albuterol (PROVENTIL HFA;VENTOLIN HFA) 108 (90 BASE) MCG/ACT inhaler Inhale 1-2 puffs into the lungs every 6 (six) hours as needed for wheezing or shortness of breath. Reported on 05/17/2015    . aspirin EC 81 MG tablet Take 81 mg by mouth daily.    . [employer] MAGNESIUM 250 MG TABS Take 1 tablet by mouth daily.  0  . feeding supplement (BOOST / RESOURCE BREEZE) LIQD Take 1 Container by mouth 3 (three) times daily between meals. (Patient taking differently: Take 1 Container by mouth 3 (three) times daily between meals. )  0  . lisinopril (PRINIVIL) 10 MG tablet Take 1 tablet (10 mg total) by mouth daily. 30 tablet 1  . mirtazapine (REMERON) 15 MG  tablet Take 15 mg by mouth at bedtime.     . phenytoin (DILANTIN) 100 MG ER capsule Take 2 caps PO every morning, take 3 caps PO every night 70 capsule 0  . pravastatin (PRAVACHOL) 40 MG tablet Take 40 mg by mouth every evening.     Marland Kitchen PRESCRIPTION MEDICATION He receives his chemo treatments at the [facility] with Dr. Burr Medico. He is on a 14 day cycle of FOLFOX.    Marland Kitchen terazosin (HYTRIN) 10 MG capsule Take 10 mg by mouth at bedtime.   0  . magic mouthwash w/lidocaine SOLN Take 5 mLs by mouth 4 (four) times daily. (Patient not taking: Reported on 01/08/2016) 300 mL 0  . prochlorperazine (COMPAZINE) 10 MG tablet Take 1 tablet (10 mg total) by mouth every 6 (six) hours as needed. (Patient not taking: Reported on 01/08/2016) 30 tablet 3   No current facility-administered medications for this visit.     REVIEW OF SYSTEMS:   Constitutional: Denies fevers, chills or abnormal night sweats Eyes: Denies blurriness of vision, double vision or watery eyes Ears, nose, mouth, throat, and face: Denies mucositis or sore throat Respiratory: Denies cough, dyspnea or wheezes Cardiovascular: Denies palpitation, chest discomfort or lower extremity swelling Gastrointestinal: mild heartburn, Denies nausea, vomiting or change in bowel habits Skin: Denies abnormal skin rashes Lymphatics: Denies new lymphadenopathy or easy bruising Neurological: (+) numbness And tingling on his fingers, no weaknesses Behavioral/Psych: Mood is stable, no new changes  All other systems were reviewed with the patient and are negative.  PHYSICAL EXAMINATION: ECOG PERFORMANCE STATUS: 1 - Symptomatic but completely ambulatory  Vitals:   01/28/16 1348  BP: 116/61  Pulse: 91  Resp: 17  Temp: 98.3 F (36.8 C)   Filed Weights   01/28/16 1348  Weight: 131 lb 6.4 oz (59.6 kg)    GENERAL:alert, no distress and comfortable SKIN: skin color, texture, turgor are normal, no rashes or significant lesions EYES:  normal, conjunctiva are pink and non-injected, sclera clear OROPHARYNX:no exudate, no erythema and lips, buccal mucosa, and tongue normal  NECK: supple, thyroid normal size, non-tender, without nodularity LYMPH:  no palpable lymphadenopathy in the cervical, axillary or inguinal LUNGS: clear to auscultation and percussion with normal breathing effort HEART: regular rate & rhythm and no murmurs and no lower extremity edema ABDOMEN:abdomen soft, non-tender and normal bowel sounds Musculoskeletal:no cyanosis of  digits and no clubbing, (+) tenderness at right hip and buttock area PSYCH: alert & oriented x 3 with fluent speech NEURO: no focal motor/sensory deficits  LABORATORY DATA:  I have reviewed the data as listed CBC Latest Ref Rng & Units 01/28/2016 01/07/2016 12/10/2015  WBC 4.0 - 10.3 10e3/uL 5.1 5.2 3.4(L)  Hemoglobin 13.0 - 17.1 g/dL 8.7(L) 10.2(L) 9.8(L)  Hematocrit 38.4 - 49.9 % 25.3(L) 29.4(L) 28.7(L)  Platelets 140 - 400 10e3/uL 137(L) 101(L) 132(L)    CMP Latest Ref Rng & Units 01/28/2016 01/07/2016 12/10/2015  Glucose 70 - 140 mg/dl 119 120 167(H)  BUN 7.0 - 26.0 mg/dL 11.3 15.9 19.7  Creatinine 0.7 - 1.3 mg/dL 0.8 0.9 0.9  Sodium 136 - 145 mEq/L 138 140 140  Potassium 3.5 - 5.1 mEq/L 4.0 4.0 4.4  Chloride 101 - 111 mmol/L - - -  CO2 22 - 29 mEq/L '27 26 26  '$ Calcium 8.4 - 10.4 mg/dL 9.0 9.6 9.4  Total Protein 6.4 - 8.3 g/dL 8.0 8.5(H) 8.6(H)  Total Bilirubin 0.20 - 1.20 mg/dL 0.78 0.38 <0.30  Alkaline Phos 40 - 150 U/L 456(H) 410(H) 362(H)  AST 5 - 34 U/L 24 36(H) 36(H)  ALT 0 - 55 U/L '11 12 14   '$ Pathology report  Diagnosis 01/26/2015 Bone, biopsy, r iliac - 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 Microscopic Comment The metastatic carcinoma is positive by immunohistochemistry with p63, cytokeratin 903 and cytokeratin 5/6. The tumor is negative with CD56, synaptophysin, chromogranin, thyroid transcription factor-1 and S100. (JDP:gt,  01/30/15)   RADIOGRAPHIC STUDIES: I have personally reviewed the radiological images as listed and agreed with the findings in the report.  PET 12/20/2015 IMPRESSION: 1. Interval progression of disease. 2. Interval development of multiple hypermetabolic liver lesions, consistent with metastatic involvement. 3. Interval enlargement and associated increased hypermetabolism of the patient's known pulmonary metastases. 4. Interval increase in hypermetabolism of the right posterior twelfth rib lesion with abnormal hypermetabolism now seen in the right sacrum adjacent to the previously demonstrated right iliac disease. 5. New hypermetabolic lesion in or just superficial to the left parotid gland. Metastatic disease at this location is a concern.    ASSESSMENT & PLAN: 70 year old African-American Guzman, with past medical history of hypertension, history of stroke, coronary artery disease, who presents with progressive dysphasia.  1. Distal esophagus squamous cell carcinoma, TxNxM1 with lung, bone and liver mets -I previously reviewed his EGD, CT and PET scan findings and the biopsy results in great details with patient and his daughter. -I discussed his right iliac bone biopsy results with patient and his son, unfortunately biopsy confirmed metastatic squamous cell carcinoma. -We reviewed the natural course of esophagitis cancer, and the incurable nature of his disease. We emphasized the goal of therapy is palliation and prolonged his life. -He has completed a short course of palliative radiation to his primary tumor, and now receiving palliative radiation to his pelvic bone metastasis -I discussed his restaging PET scan from 12/20/2015, which unfortunately showed disease progression in his bone and lung, and the new liver metastasis. -He is clinically doing well, with good organ functions and performance status, would be a candidate for further treatment -I discussed limited third line  systemic treatment options, including docetaxel, irinotecan, etc., however the response rate is likely going to be low. Recently immunotherapy, especially PD1 inhibitor has been test is in esophageal cancer, and showed promising results. The response rate has been in 10-20% rage, and up to 40% with Nivo-ipilumumab, response rate is  slightly higher in PD-L1 expressed tumor, but has been seen in PT-L1 negative tumor also. I recommend him to try Keytruda as next line therapu --Chemotherapy consent: Side effects including but does not not limited to, fatigue, nausea, skin rash, hypothyroidism, and other endocrine dysfunction, pneumonitis, etc,  were discussed with patient in great detail. He agrees to proceed. -he will start the first cycle today    2. Seizure -he had a seizure episode this morning. Resolved now. He is compliant with Dilantin. -I strongly encouraged him to follow-up with his urologist.  3. HTN, CAD, history of CVA -Follow up with primary care physician -We'll watch his blood pressure closely when he is on chemotherapy, normal lately.  4. 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  -His  anemia has gotten worse lately, likely secondary to his pelvic irradiation. -He is not symptomatically, we'll continue observation  5. Hyperglycemia -his BG has been in 200-300 range, probably related to pre-meds no steroids,  history of DM -I strongly encourage him to follow up with PCP, I will monitor his BG closely when on chemo   6. Peripheral neuropathy, G2 -He has mild to moderate neuropathy in his hands from chemo, especially taxol  -We'll continue observe closely. -We'll consider Neurontin if his neuropathy gets worse, especially tingling and pain   Plan -Lab results reviewed with patient, we'll proceed cycle 1 Keytruda today -Return to  clinic with lab in 3 weeks for cycle 2  All questions were answered. The patient knows to call the clinic with any problems, questions or concerns.  I spent 25 minutes counseling the patient face to face. The total time spent in the appointment was 30 minutes and more than 50% was on counseling.     Truitt Merle, MD 01/28/2016

## 2016-01-29 ENCOUNTER — Telehealth: Payer: Self-pay | Admitting: *Deleted

## 2016-01-29 LAB — TSH: TSH: 0.985 m(IU)/L (ref 0.320–4.118)

## 2016-01-29 NOTE — Telephone Encounter (Signed)
Called Andre Guzman for chemotherapy F/U.  Spoke with wife [name] who spoke with him this nurse heard audible through phone call.  Patient is doing well.  Denies n/v.  Denies any new side effects or symptoms.  Bowel and bladder is functioning well.  Eating and drinking well and I instructed to drink 64 oz minimum daily or at least the day before, of and after treatment.  Denies questions at this time and encouraged to call if needed.  Reviewed how to call after hours in the case of an emergency.

## 2016-01-29 NOTE — Telephone Encounter (Signed)
-----   Message from Ma Hillock, RN sent at 01/28/2016  6:06 PM EDT ----- Regarding: 1st Chemo F/U : Burr Medico Patient received 1st time Keytruda and tolerated infusion without complication. Please call pt to follow up.

## 2016-01-29 NOTE — Addendum Note (Signed)
Encounter addended by: Jacqulyn Liner, RN on: 01/29/2016  4:35 PM<BR>    Actions taken: Charge Capture section accepted

## 2016-02-04 ENCOUNTER — Other Ambulatory Visit: Payer: Self-pay | Admitting: *Deleted

## 2016-02-07 DIAGNOSIS — C159 Malignant neoplasm of esophagus, unspecified: Secondary | ICD-10-CM | POA: Diagnosis not present

## 2016-02-14 ENCOUNTER — Ambulatory Visit (INDEPENDENT_AMBULATORY_CARE_PROVIDER_SITE_OTHER): Payer: Medicare Other | Admitting: Neurology

## 2016-02-14 ENCOUNTER — Encounter: Payer: Self-pay | Admitting: Neurology

## 2016-02-14 VITALS — BP 120/60 | HR 75 | Ht 68.0 in | Wt 136.0 lb

## 2016-02-14 DIAGNOSIS — Z8679 Personal history of other diseases of the circulatory system: Secondary | ICD-10-CM

## 2016-02-14 DIAGNOSIS — G40209 Localization-related (focal) (partial) symptomatic epilepsy and epileptic syndromes with complex partial seizures, not intractable, without status epilepticus: Secondary | ICD-10-CM

## 2016-02-14 DIAGNOSIS — C159 Malignant neoplasm of esophagus, unspecified: Secondary | ICD-10-CM | POA: Diagnosis not present

## 2016-02-14 MED ORDER — PHENYTOIN SODIUM EXTENDED 100 MG PO CAPS
ORAL_CAPSULE | ORAL | 11 refills | Status: AC
Start: 2016-02-14 — End: ?

## 2016-02-14 NOTE — Patient Instructions (Addendum)
1. Continue Dilantin 100mg : Take 2 caps in AM, 3 caps in PM 2. Have bloodwork for Dilantin level first thing in the morning 3. Follow-up 6 months  Seizure Precautions: 1. If medication has been prescribed for you to prevent seizures, take it exactly as directed.  Do not stop taking the medicine without talking to your doctor first, even if you have not had a seizure in a long time.   2. Avoid activities in which a seizure would cause danger to yourself or to others.  Don't operate dangerous machinery, swim alone, or climb in high or dangerous places, such as on ladders, roofs, or girders.  Do not drive unless your doctor says you may.  3. If you have any warning that you may have a seizure, lay down in a safe place where you can't hurt yourself.    4.  No driving for 6 months from last seizure, as per [facility].   Please refer to the following link on the [employer] website for more information: http://www.epilepsyfoundation.org/answerplace/Social/driving/drivingu.cfm   5.  Maintain good sleep hygiene. Avoid alcohol  6.  Contact your doctor if you have any problems that may be related to the medicine you are taking.  7.  Call 911 and bring the patient back to the ED if:        A.  The seizure lasts longer than 5 minutes.       B.  The patient doesn't awaken shortly after the seizure  C.  The patient has new problems such as difficulty seeing, speaking or moving  D.  The patient was injured during the seizure  E.  The patient has a temperature over 102 F (39C)  F.  The patient vomited and now is having trouble breathing

## 2016-02-14 NOTE — Progress Notes (Signed)
NEUROLOGY FOLLOW UP OFFICE NOTE  Andre Guzman [id number]  HISTORY OF PRESENT ILLNESS: I had the pleasure of seeing Andre Guzman in follow-up in the neurology clinic on 02/14/2016.  The patient was last seen 7 months ago for an increase in seizures and is again accompanied by his wife who helps supplement the history today.  On his initial visit, he was accompanied by his son, and we had discussed doing an EEG, as well as the option to switching to a different seizure medication. He was hesitant to change, and agreed to increase Dilantin to 200mg  BID, repeat Dilantin level was ordered but not done. He was back in the ER on 08/02/15 with another nocturnal seizure witnessed by his wife. Dilantin level was 9.6, he was given an IV load and discharged home. He had another seizure on 10/20/15, Dilantin level 8.5. Dose was increased in the ER to 200mg  in AM, 300mg  in PM. He and his wife deny any seizures since then. There is note from his oncologist about a seizure the morning of 9/11, but they deny any seizures this month. He denies any side effects on higher dose Dilantin, no dizziness, diplopia, or gait instability. He feels a little off with his walking but unchanged with Dilantin increase. Records from his oncologist were reviewed, he has lung, bone, and liver metastases. Clinically he is doing well, he continues on chemotherapy with Keytruda. He denies any headaches, focal numbness/tingling/weakness, no staring/unresponsive episodes, gaps in time, olfactory/gustatory hallucinations.   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 He is a poor historian, history obtained from records available and his wife on the phone. He presented to [facility] in March 2003 with a seizure and was found to have a subacute subdural  hematoma. He underwent craniotomy and evacuation of the subdural hematoma, then was taken back to surgery 2 days later for reaccumulation and underwent craniectomy and evacuation. He returned in 01/2002 for a right frontoparietal cranioplasty with titanium mesh. 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 They were overall well-controlled on Dilantin, with nocturnal seizures occurring around once a year. He has been taking Dilantin 100mg  2 caps at night, and alternating 1 capsule then 2 capsules in the morning. He had been seizure-free for a year until 04/20/15 when he had a nocturnal seizure. He was brought to [facility] where CBC showed a WBC of 3.6, Hct 28.4, Plt 203, BMP unremarkable, Dilantin level 9.1. He was given additional IV Dilantin and discharged home on same dose. He was back in the ER on 05/06/15 with another seizure out of sleep. Total Dilantin level was 9.0, but corrected Dilantin level was 13 and no changes were made. He was back in the ER on 06/02/15 for another nocturnal seizure. At that time, family was reporting one seizure a day since starting chemotherapy. Total dilantin level was 7.1. He has no recollection of the seizures, and has woken up with urinary incontinence with some of them. He tells me his wife has noticed he may have a seizure after he drinks alcohol (beer), but denies significant alcohol intake.   His son denies any staring/unresponsive episodes. He denies any olfactory/gustatory hallucinations, deja vu, rising epigastric sensation, focal numbness/tingling/weakness, myoclonic jerks. He reports his memory is not good. He does not drive.   Epilepsy Risk Factors: History  of subdural hemotoma s/p right frontoparietal cranioplasty. Otherwise he had a normal birth and early development. There is no history of febrile convulsions, CNS infections such as meningitis/encephalitis, or family history of seizures.  Diagnostic Data: No prior EEG available for review. He had  an MRI brain at Dr. Donnella Bi office done 05/25/15 which did not show any acute changes, there was diffuse atrophy with hydrocephalus ex vacuo, considerable brain substance loss both cortically and centrally. Chronic microvascular ischemic change is seen throughout with both focal and confluent areas of FLAIR hyperintensity in the white matter, remote right frontoparietal creniectomy with subsequent cranioplasty. 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  PAST MEDICAL HISTORY: Past Medical History:  Diagnosis Date  . COPD (chronic obstructive pulmonary disease) ([place])   . Coronary artery disease   . Esophageal Andre ([place])   . Hypertension   . Pneumonia   . Radiation 01/25/15-02/13/15   35 gray for esophageal Andre  . Seizures ([place])   . Shortness of breath   . Stroke [facility])     MEDICATIONS: Current Outpatient Prescriptions on File Prior to Visit  Medication Sig Dispense Refill  . albuterol (PROVENTIL HFA;VENTOLIN HFA) 108 (90 BASE) MCG/ACT inhaler Inhale 1-2 puffs into the lungs every 6 (six) hours as needed for wheezing or shortness of breath. Reported on 05/17/2015    . aspirin EC 81 MG tablet Take 81 mg by mouth daily.    . feeding supplement (BOOST / RESOURCE BREEZE) LIQD Take 1 Container by mouth 3 (three) times daily between meals. (Patient taking differently: Take 1 Container by mouth 2 (two) times daily between meals. )  0  . lisinopril (PRINIVIL) 10 MG tablet Take 1 tablet (10 mg total) by mouth daily. 30 tablet 1  . mirtazapine (REMERON) 15 MG tablet Take 15 mg by mouth at bedtime.     . pravastatin (PRAVACHOL) 40 MG tablet Take 40 mg by mouth every evening.     Marland Kitchen PRESCRIPTION MEDICATION He receives his chemo treatments at the [facility] with Dr. Burr Medico. He is on a 14 day cycle of FOLFOX.    Marland Kitchen terazosin (HYTRIN) 10 MG capsule Take 10 mg by mouth at bedtime.   0  Dilantin 100mg      Take 2 caps in AM, 3 caps in  PM No current facility-administered medications on file prior to visit.    ALLERGIES: No Known Allergies  FAMILY HISTORY: Family History  Problem Relation Age of Onset  . Heart disease Mother   . Hypertension Mother   . Heart disease Sister   . Hypertension Sister   . Heart disease Brother   . Hypertension Daughter     SOCIAL HISTORY: Social History   Social History  . Marital status: Married    Spouse name: N/A  . Number of children: 4  . Years of education: N/A   Occupational History  . Not on file.   Social History Main Topics  . Smoking status: Former Smoker    Packs/day: 0.50    Years: 50.00    Types: Cigarettes    Quit date: 12/27/2014  . Smokeless tobacco: Never Used  . Alcohol use 0.0 oz/week     Comment: occasional beer, 1-2 per week, used to drink daily heavily   . Drug use: No  . Sexual activity: Not on file     Comment: did not ask   Other Topics Concern  . Not on file  Social History Narrative   Married, wife [name]   Independent in ADLs   # 4 children ( 1 girl and 3 boys)    REVIEW OF SYSTEMS: Constitutional: No fevers, chills, or sweats, no generalized fatigue, change in appetite Eyes: No visual changes, double vision, eye pain Ear, nose and throat: No hearing loss, ear pain, nasal congestion, sore throat Cardiovascular: No chest pain, palpitations Respiratory:  No shortness of breath at rest or with exertion, wheezes GastrointestinaI: No nausea, vomiting, diarrhea, abdominal pain, fecal incontinence Genitourinary:  No dysuria, urinary retention or frequency Musculoskeletal:  No neck pain, back pain Integumentary: No rash, pruritus, skin lesions Neurological: as above Psychiatric: No depression, insomnia, anxiety Endocrine: No palpitations, fatigue, diaphoresis, mood swings, change in appetite, change in weight, increased thirst Hematologic/Lymphatic:  No anemia, purpura, petechiae. Allergic/Immunologic: no itchy/runny eyes, nasal  congestion, recent allergic reactions, rashes  PHYSICAL EXAM: Vitals:   02/14/16 1132  BP: 120/60  Pulse: 75   General: No acute distress Head:  Normocephalic/atraumatic Neck: supple, no paraspinal tenderness, full range of motion Heart:  Regular rate and rhythm Lungs:  Clear to auscultation bilaterally Back: No paraspinal tenderness Skin/Extremities: No rash, no edema Neurological Exam: alert and oriented to person, place, and time. No aphasia or dysarthria. Fund of knowledge is appropriate.  Recent and remote memory are intact. Attention and concentration are normal.    Able to name objects and repeat phrases. Cranial nerves: Pupils equal, round, reactive to light. Extraocular movements intact with no nystagmus. Visual fields full. Facial sensation intact. No facial asymmetry. Tongue, uvula, palate midline.  Motor: Bulk and tone normal, muscle strength 5/5 throughout with no pronator drift.  Sensation to light touch intact.  No extinction to double simultaneous stimulation.  Deep tendon reflexes 2+ throughout, toes downgoing.  Finger to nose testing intact.  Gait narrow-based and steady, mild difficulty with tandem walk but able.  Romberg negative.  IMPRESSION: This is a pleasant 70 yo RH man with a history of hyperlipidemia, subdural hematoma s/p evacuation and right frontoparietal craniotomy, subsequent nocturnal seizures since 2003, with metastatic esophageal squamous cell carcinoma. He had an increase in seizure frequency since December 2016. His seizures had been well-controlled with around one seizure a year, however since December 2016, he has had more frequent seizures, most recently in June 2017. He has had subtherapeutic Dilantin levels and reports compliance. On his last ER visit, Dilantin dose was increased to 500mg /day, he denies any side effects on high dose Dilantin. A repeat Dilantin level will be checked. We discussed that if it is supratherapeutic, would recommend switching to a  different seizure medication such as Keppra. He is aware of [place] driving laws to stop driving after a seizure until 6 months seizure-free. He does not drive. He will follow-up in 6 months and knows to call for any changes.   Thank you for allowing me to participate in his care.  Please do not hesitate to call for any questions or concerns.  The duration of this appointment visit was 15 minutes of face-to-face time with the patient.  Greater than 50% of this time was spent in counseling, explanation of diagnosis, planning of further management, and coordination of care.   Ellouise Newer, M.D.   CC: Dr. Baird Andre, Dr. Burr Medico

## 2016-02-15 ENCOUNTER — Other Ambulatory Visit: Payer: Medicare Other

## 2016-02-15 DIAGNOSIS — C159 Malignant neoplasm of esophagus, unspecified: Secondary | ICD-10-CM | POA: Diagnosis not present

## 2016-02-15 DIAGNOSIS — Z8679 Personal history of other diseases of the circulatory system: Secondary | ICD-10-CM | POA: Diagnosis not present

## 2016-02-15 DIAGNOSIS — G40209 Localization-related (focal) (partial) symptomatic epilepsy and epileptic syndromes with complex partial seizures, not intractable, without status epilepticus: Secondary | ICD-10-CM

## 2016-02-16 LAB — PHENYTOIN LEVEL, TOTAL: Phenytoin Lvl: 19.1 ug/mL (ref 10.0–20.0)

## 2016-02-19 NOTE — Progress Notes (Signed)
  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 The patient continued to have pain in the right hip after sitting for some time.  Overall some improvement.  Plan: The patient has completed radiation treatment. The patient will return to radiation oncology clinic for routine followup in one month. I advised them to call or return sooner if they have any questions or concerns related to their recovery or treatment.  -----------------------------------  Blair Promise, PhD, MD  This document serves as a record of services personally performed by Gery Pray, MD. It was created on his behalf by Darcus Austin, a trained medical scribe. The creation of this record is based on the scribe's personal observations and the provider's statements to them. This document has been checked and approved by the attending provider.

## 2016-02-20 LAB — PHENYTOIN LEVEL, FREE AND TOTAL: Phenytoin, Free: 2.4 mg/L — ABNORMAL HIGH (ref 1.0–2.0)

## 2016-02-25 ENCOUNTER — Telehealth: Payer: Self-pay | Admitting: *Deleted

## 2016-02-25 ENCOUNTER — Encounter: Payer: Self-pay | Admitting: Oncology

## 2016-02-25 NOTE — Telephone Encounter (Signed)
Received report from [facility].   Left report on Dr. Ernestina Penna desk for review.

## 2016-02-28 ENCOUNTER — Encounter: Payer: Self-pay | Admitting: *Deleted

## 2016-02-28 ENCOUNTER — Encounter: Payer: Self-pay | Admitting: Radiation Oncology

## 2016-02-28 ENCOUNTER — Ambulatory Visit
Admission: RE | Admit: 2016-02-28 | Discharge: 2016-02-28 | Disposition: A | Discharge status: Home / Self Care | Payer: Medicare Other | Source: Ambulatory Visit | Attending: Radiation Oncology | Admitting: Radiation Oncology

## 2016-02-28 VITALS — BP 134/68 | HR 78 | Temp 98.9°F | Ht 68.0 in | Wt 133.8 lb

## 2016-02-28 DIAGNOSIS — R5383 Other fatigue: Secondary | ICD-10-CM | POA: Diagnosis not present

## 2016-02-28 DIAGNOSIS — C7951 Secondary malignant neoplasm of bone: Secondary | ICD-10-CM | POA: Diagnosis not present

## 2016-02-28 DIAGNOSIS — C159 Malignant neoplasm of esophagus, unspecified: Secondary | ICD-10-CM | POA: Insufficient documentation

## 2016-02-28 DIAGNOSIS — Z923 Personal history of irradiation: Secondary | ICD-10-CM | POA: Diagnosis not present

## 2016-02-28 DIAGNOSIS — Z7982 Long term (current) use of aspirin: Secondary | ICD-10-CM | POA: Diagnosis not present

## 2016-02-28 DIAGNOSIS — Z79899 Other long term (current) drug therapy: Secondary | ICD-10-CM | POA: Insufficient documentation

## 2016-02-28 NOTE — Progress Notes (Signed)
Andre Guzman here for follow up.  He denies having pain today but reports having occasional pain in his right hip.  He denies having any numbness in his right leg or trouble walking.  He reports having fatigue.  He is also wondering if he will have more chemotherapy.  He reports having a good appetite.  BP 134/68 (BP Location: Left Arm, Patient Position: Sitting)   Pulse 78   Temp 98.9 F (37.2 C) (Oral)   Ht 5\' 8"  (1.727 m)   Wt 133 lb 12.8 oz (60.7 kg)   SpO2 100%   BMI 20.34 kg/m    Wt Readings from Last 3 Encounters:  02/28/16 133 lb 12.8 oz (60.7 kg)  02/14/16 136 lb (61.7 kg)  01/28/16 131 lb 6.4 oz (59.6 kg)

## 2016-02-28 NOTE — Progress Notes (Signed)
Radiation Oncology         [phone] ________________________________  Name: DELTA ISING MRN: [id number]  Date: 02/28/2016  DOB: Apr 20, 1946  Follow-Up Visit Note  CC: Andre Greenland, MD  Truitt Merle, MD    ICD-9-CM ICD-10-CM   1. 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 He reports a good appetite with no difficulty swallowing or restrictions on food he can consume. He denies having pain today but reports having occasional pain in his right hip. He denies any numbness in his right leg or trouble walking. Patient reports having fatigue. He is also wondering if he will have more chemotherapy. He reports having a good appetite.                            ALLERGIES:  has No Known Allergies.  Meds: Current Outpatient Prescriptions  Medication Sig Dispense Refill  . aspirin EC 81 MG tablet Take 81 mg by mouth daily.    . [employer] MAGNESIUM 250 MG TABS Take 1 tablet by mouth daily.  0  . feeding supplement (BOOST / RESOURCE BREEZE) LIQD Take 1 Container by mouth 3 (three) times daily between meals. (Patient taking differently: Take 1 Container by mouth 3 (three) times daily between meals. )  0  . lisinopril (PRINIVIL) 10 MG tablet Take 1 tablet (10 mg total) by mouth daily. 30 tablet 1  . mirtazapine (REMERON) 15 MG tablet Take 15 mg by mouth at bedtime.     . phenytoin (DILANTIN) 100 MG ER capsule Take 2 caps PO every morning, take 3 caps PO every night 150 capsule 11  . pravastatin (PRAVACHOL) 40 MG tablet Take 40 mg by mouth every evening.     . terazosin (HYTRIN) 10 MG capsule Take 10 mg by mouth at bedtime.   0  . albuterol (PROVENTIL HFA;VENTOLIN HFA) 108 (90 BASE) MCG/ACT inhaler Inhale 1-2 puffs into the lungs every 6 (six)  hours as needed for wheezing or shortness of breath. Reported on 05/17/2015    . magic mouthwash w/lidocaine SOLN Take 5 mLs by mouth 4 (four) times daily. (Patient not taking: Reported on 02/28/2016) 300 mL 0  . PRESCRIPTION MEDICATION He receives his chemo treatments at the [facility] with Dr. Burr Medico. He is on a 14 day cycle of FOLFOX.    Marland Kitchen prochlorperazine (COMPAZINE) 10 MG tablet Take 1 tablet (10 mg total) by mouth every 6 (six) hours as needed. (Patient not taking: Reported on 02/28/2016) 30 tablet 3   No current facility-administered medications for this encounter.     Physical Findings: The patient is in no acute distress. Patient is alert and oriented.  height is 5\' 8"  (1.727 m) and weight is 133 lb 12.8 oz (60.7 kg). His oral temperature is 98.9 F (37.2 C). His blood pressure is 134/68 and his pulse is 78. His oxygen saturation is 100%. .  No significant changes. Lungs are clear to auscultation bilaterally. Heart has regular rate and rhythm. No palpable cervical, supraclavicular, or axillary adenopathy. Abdomen soft, non-tender, normal bowel sounds. Motor strength 5 out of 5 in the lower extremities.   Lab Findings: Lab Results  Component Value Date  WBC 5.1 01/28/2016   HGB 8.7 (L) 01/28/2016   HCT 25.3 (L) 01/28/2016   MCV 94.4 01/28/2016   PLT 137 (L) 01/28/2016    Radiographic Findings: No results found.  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 Patient has received benefit from palliative radiation therapy to his right pelvis. His pain, overall, is better.  Plan:  Prn follow-up with radiation oncology. Patient will talk with Dr. Burr Medico about additional chemotherapy.  ____________________________________  This document serves as a record of services personally performed by Gery Pray, MD. It was created on his behalf by Bethann Humble, a trained medical scribe. The creation of this  record is based on the scribe's personal observations and the provider's statements to them. This document has been checked and approved by the attending provider.

## 2016-02-28 NOTE — Progress Notes (Signed)
  Oncology Nurse Navigator Documentation  Navigator Location: [facility] (02/28/16 1220) Navigator Encounter Type: Letter/Fax/Email (02/28/16 1220)   [facility] results received and placed on MD desk.

## 2016-03-14 ENCOUNTER — Ambulatory Visit: Payer: Medicare Other

## 2016-03-14 ENCOUNTER — Encounter: Payer: Medicare Other | Admitting: Hematology

## 2016-03-14 ENCOUNTER — Other Ambulatory Visit: Payer: Medicare Other

## 2016-03-14 ENCOUNTER — Telehealth: Payer: Self-pay | Admitting: *Deleted

## 2016-03-14 NOTE — Progress Notes (Signed)
No show, will call pt and reschedule   This encounter was created in error - please disregard.

## 2016-03-14 NOTE — Telephone Encounter (Signed)
Patient did not show for his office visit/treatment today. Called home # and left VM with [name] requesting return call to GI Navigator to get him rescheduled for treatment and to see Dr. Burr Medico.

## 2016-03-17 ENCOUNTER — Telehealth: Payer: Self-pay | Admitting: Hematology

## 2016-03-17 NOTE — Telephone Encounter (Signed)
lvm for pt wife to inform her r/s appts per LOS. Gave pt next available chemo appt per chemo scheduler

## 2016-03-18 NOTE — Progress Notes (Signed)
[place]  Telephone:([phone]) [phone] Fax:([phone]) [phone]  Clinic Follow up Note   Patient Care Team: Glendale Chard, MD as PCP - General (Internal Medicine) Adrian Prows, MD as Consulting Physician (Cardiology) Jovita Gamma, MD as Consulting Physician (Neurosurgery) Lowella Bandy, MD as Consulting Physician (Urology) Grace Isaac, MD as Consulting Physician (Cardiothoracic Surgery) Truitt Merle, MD as Consulting Physician (Hematology) Tania Ade, RN as Registered Nurse Carol Ada, MD as Consulting Physician (Gastroenterology) Gery Pray, MD as Consulting Physician (Radiation Oncology) 03/21/2016  CHIEF COMPLAINTS:  Follow up esophageal squamous cell carcinoma  Oncology History   Squamous cell esophageal cancer   Staging form: Esophagus - Squamous Cell Carcinoma, AJCC 7th Edition     Clinical: Stage IV ([place], N0, M1) - Unsigned       Squamous cell esophageal cancer ([place])   01/02/2015 Initial Diagnosis    Squamous cell esophageal cancer      01/02/2015 Procedure    EGD by Dr. Benson Norway showed a large, fungating mass with no bleeding and with stigmata met of recent bleeding was found in the third of the esophagus. The mass was completely obstructing and circumferential. The stomach and examined duodenum were normal.      01/02/2015 Imaging    CT CAP showed 9 cm segment of irregular his social worker thickening. Single enlarged subcarinal lymph node, bilateral pulmonary nodules (3), 66m right middle lobe next to right atrium, 3 RUL and a 7 mm lingular lung nodules.       01/02/2015 Initial Biopsy    Esophageal mass biopsy showed poorly differentiated squamous cell carcinoma.      01/18/2015 Imaging    PET scan showed hypermetabolic esophageal mass, subcarinal and possible right hilar adenopathy, several lung nodules, and a hypermetabolic bone lesion in right ilium, compatible with metastatic disease.       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      08/09/2015 -  Chemotherapy    second line chemo Carboplatin AUC 5 and paclitaxel 1 75 mg/m, every 3 weeks      10/04/2015 Imaging    PET scan showed interval response to therapy, decreased in hypermetabolism of bilateral pulmonary metastases and bone metastasis, no new lesions.      12/20/2015 Imaging    Restaging PET showed interval disease progression in lung and bone, newly developed multiple liver metastasis,      01/03/2016 - 01/24/2016 Radiation Therapy    Palliative radiation to right pelvic bone metastasis for pain control      02/28/2016 Miscellaneous    Foundation One results received       HISTORY OF PRESENTING ILLNESS:  Andre CUSTER89y.o. male is here because of recently diagnosed esophageal squamous cell carcinoma.  He has had dysphagia for one month, with solid food mainly, he is able to keep liquid and soft diet down. He lost about 30 lbs in the the past month, but his weight has been stable in the past week since he started taking boost. He had some chest  pain when he swallows. He is constipated, on stool softener, no bleeding.  He otherwise feels fine. His energy level is fair, he functions very well, no limitation on his physical activities. No nausea, no other pain, cough or other, he drinks about 2 boost a day, soup,and some soft diet.  He was referred to gastroenterologist Dr. Benson Norway by his primary care physician. He underwent EGD which showed a circumferential, obstructing, fungating mass in the third esophagus, biopsy showed poorly differentiated squamous cell  carcinoma. CT scan chest abdomen and pelvis showed 3 lung nodules, with the largest 1.2 cm in the right middle lobe next to the right atrium.  CURRENT THERAPY: Keytruda '240mg'$  every 3 weeks, starting 01/28/2016  INTERIM HISTORY Rahim returns for follow up and second cycle treatment. He missed his most recent follow up appointment and has not been seen in our clinic since 01/28/16. He is accompanied by his daughter. He did not experience any problems with his first cycle of treatment. He complains of right hip pain with walking. He does not experience pain when sitting He is eating well. He denies issues swallowing. He has not received a flu shot this year.  MEDICAL HISTORY:  Past Medical History:  Diagnosis Date  . COPD (chronic obstructive pulmonary disease) ([place])   . Coronary artery disease   . Esophageal cancer ([place])   . History of radiation therapy 01/03/16-01/24/16   right pelvis ([place]) 37.5 Gy  . Hypertension   . Pneumonia   . Radiation 01/25/15-02/13/15   35 gray for esophageal cancer  . Seizures ([place])   . Shortness of breath   . Stroke [facility])     SURGICAL HISTORY: Past Surgical History:  Procedure Laterality Date  . CARDIAC CATHETERIZATION    . CARDIAC VALVE SURGERY    . CORONARY ANGIOPLASTY WITH STENT PLACEMENT  07/22/2011  . CORONARY ARTERY BYPASS GRAFT    . ESOPHAGOGASTRODUODENOSCOPY  01/02/15  . LEFT HEART CATHETERIZATION WITH CORONARY ANGIOGRAM N/A 07/01/2011   Procedure: LEFT HEART CATHETERIZATION WITH CORONARY ANGIOGRAM;  Surgeon: Laverda Page, MD;  Location: [facility] CATH LAB;  Service: Cardiovascular;  Laterality: N/A;  . LEFT HEART CATHETERIZATION WITH CORONARY ANGIOGRAM N/A 10/30/2011   Procedure: LEFT HEART CATHETERIZATION WITH CORONARY ANGIOGRAM;  Surgeon: Laverda Page, MD;  Location: [facility] CATH LAB;  Service: Cardiovascular;  Laterality: N/A;  . LOWER EXTREMITY ANGIOGRAM N/A 07/01/2011   Procedure: LOWER EXTREMITY ANGIOGRAM;  Surgeon: Laverda Page, MD;  Location:  [facility] CATH LAB;  Service: Cardiovascular;  Laterality: N/A;  . PERCUTANEOUS CORONARY ROTOBLATOR INTERVENTION (PCI-R) N/A 07/22/2011   Procedure: PERCUTANEOUS CORONARY ROTOBLATOR INTERVENTION (PCI-R);  Surgeon: Laverda Page, MD;  Location: [facility] CATH LAB;  Service: Cardiovascular;  Laterality: N/A;    SOCIAL HISTORY: Social History   Social History  . Marital status: Married    Spouse name: N/A  . Number of children: 4  . Years of education: N/A   Social History Main Topics  . Smoking status: Former Smoker    Packs/day: 0.50    Years: 50.00    Types: Cigarettes    Quit date: 12/27/2014  . Smokeless tobacco: Never Used  . Alcohol use 0.0 oz/week     Comment: occasional beer, 1-2 per week, used to drink daily heavily   . Drug use: No  . Sexual activity: Not on file     Comment: did not ask   Other Topics Concern  . Not on file   Social History Narrative   Married, wife [name]  Independent in ADLs   # 4 children ( 1 girl and 3 boys)    FAMILY HISTORY: Family History  Problem Relation Age of Onset  . Heart disease Mother   . Hypertension Mother   . Heart disease Sister   . Hypertension Sister   . Heart disease Brother   . Hypertension Daughter     ALLERGIES:  has No Known Allergies.  MEDICATIONS:  Current Outpatient Prescriptions  Medication Sig Dispense Refill  . albuterol (PROVENTIL HFA;VENTOLIN HFA) 108 (90 BASE) MCG/ACT inhaler Inhale 1-2 puffs into the lungs every 6 (six) hours as needed for wheezing or shortness of breath. Reported on 05/17/2015    . aspirin EC 81 MG tablet Take 81 mg by mouth daily.    . [employer] MAGNESIUM 250 MG TABS Take 1 tablet by mouth daily.  0  . feeding supplement (BOOST / RESOURCE BREEZE) LIQD Take 1 Container by mouth 3 (three) times daily between meals. (Patient taking differently: Take 1 Container by mouth 3 (three) times daily between meals. )  0  . lisinopril (PRINIVIL) 10 MG tablet Take 1 tablet (10 mg total) by mouth daily. 30 tablet 1    . mirtazapine (REMERON) 15 MG tablet Take 15 mg by mouth at bedtime.     . phenytoin (DILANTIN) 100 MG ER capsule Take 2 caps PO every morning, take 3 caps PO every night 150 capsule 11  . pravastatin (PRAVACHOL) 40 MG tablet Take 40 mg by mouth every evening.     Marland Kitchen PRESCRIPTION MEDICATION He receives his chemo treatments at the [facility] at [facility] with Dr. Mosetta Putt. He is on a 14 day cycle of FOLFOX.    Marland Kitchen prochlorperazine (COMPAZINE) 10 MG tablet Take 1 tablet (10 mg total) by mouth every 6 (six) hours as needed. 30 tablet 3  . terazosin (HYTRIN) 10 MG capsule Take 10 mg by mouth at bedtime.   0  . magic mouthwash w/lidocaine SOLN Take 5 mLs by mouth 4 (four) times daily. (Patient not taking: Reported on 03/21/2016) 300 mL 0   Current Facility-Administered Medications  Medication Dose Route Frequency Provider Last Rate Last Dose  . Influenza vac split quadrivalent PF (FLUARIX) injection 0.5 mL  0.5 mL Intramuscular Once Malachy Mood, MD        REVIEW OF SYSTEMS:   Constitutional: Denies fevers, chills or abnormal night sweats Eyes: Denies blurriness of vision, double vision or watery eyes Ears, nose, mouth, throat, and face: Denies mucositis or sore throat Respiratory: Denies cough, dyspnea or wheezes Cardiovascular: Denies palpitation, chest discomfort or lower extremity swelling Gastrointestinal: mild heartburn, Denies nausea, vomiting or change in bowel habits Skin: Denies abnormal skin rashes Musculoskeletal: (+) joint pain Lymphatics: Denies new lymphadenopathy or easy bruising Neurological: No weaknesses Behavioral/Psych: Mood is stable, no new changes  All other systems were reviewed with the patient and are negative.   PHYSICAL EXAMINATION: ECOG PERFORMANCE STATUS: 1 - Symptomatic but completely ambulatory  Vitals:   03/21/16 1346  BP: 140/67  Pulse: 87  Resp: 18  Temp: 98.4 F (36.9 C)   Filed Weights   03/21/16 1346  Weight: 135 lb 14.4 oz (61.6 kg)     GENERAL:alert, no distress and comfortable SKIN: skin color, texture, turgor are normal, no rashes or significant lesions EYES: normal, conjunctiva are pink and non-injected, sclera clear OROPHARYNX:no exudate, no erythema and lips, buccal mucosa, and tongue normal  NECK: supple, thyroid normal size, non-tender, without nodularity LYMPH:  no palpable lymphadenopathy  in the cervical, axillary or inguinal LUNGS: clear to auscultation and percussion with normal breathing effort HEART: regular rate & rhythm and no murmurs and no lower extremity edema ABDOMEN:abdomen soft, non-tender and normal bowel sounds Musculoskeletal:no cyanosis of digits and no clubbing, (+) tenderness at right hip and buttock area (improved) PSYCH: alert & oriented x 3 with fluent speech NEURO: no focal motor/sensory deficits   LABORATORY DATA:  I have reviewed the data as listed CBC Latest Ref Rng & Units 03/21/2016 01/28/2016 01/07/2016  WBC 4.0 - 10.3 10e3/uL 6.3 5.1 5.2  Hemoglobin 13.0 - 17.1 g/dL 9.7(L) 8.7(L) 10.2(L)  Hematocrit 38.4 - 49.9 % 28.6(L) 25.3(L) 29.4(L)  Platelets 140 - 400 10e3/uL 131(L) 137(L) 101(L)    CMP Latest Ref Rng & Units 03/21/2016 01/28/2016 01/07/2016  Glucose 70 - 140 mg/dl 142(H) 119 120  BUN 7.0 - 26.0 mg/dL 9.8 11.3 15.9  Creatinine 0.7 - 1.3 mg/dL 0.7 0.8 0.9  Sodium 136 - 145 mEq/L 138 138 140  Potassium 3.5 - 5.1 mEq/L 3.5 4.0 4.0  Chloride 101 - 111 mmol/L - - -  CO2 22 - 29 mEq/L '29 27 26  '$ Calcium 8.4 - 10.4 mg/dL 8.6 9.0 9.6  Total Protein 6.4 - 8.3 g/dL 8.0 8.0 8.5(H)  Total Bilirubin 0.20 - 1.20 mg/dL 0.63 0.78 0.38  Alkaline Phos 40 - 150 U/L 566(H) 456(H) 410(H)  AST 5 - 34 U/L 56(H) 24 36(H)  ALT 0 - 55 U/L '15 11 12   '$ Pathology report  Diagnosis 01/26/2015 Bone, biopsy, r iliac - 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 Microscopic Comment The metastatic carcinoma is positive by immunohistochemistry with p63, cytokeratin 903 and cytokeratin  5/6. The tumor is negative with CD56, synaptophysin, chromogranin, thyroid transcription factor-1 and S100. (JDP:gt, 01/30/15)   RADIOGRAPHIC STUDIES: I have personally reviewed the radiological images as listed and agreed with the findings in the report.  PET 12/20/2015 IMPRESSION: 1. Interval progression of disease. 2. Interval development of multiple hypermetabolic liver lesions, consistent with metastatic involvement. 3. Interval enlargement and associated increased hypermetabolism of the patient's known pulmonary metastases. 4. Interval increase in hypermetabolism of the right posterior twelfth rib lesion with abnormal hypermetabolism now seen in the right sacrum adjacent to the previously demonstrated right iliac disease. 5. New hypermetabolic lesion in or just superficial to the left parotid gland. Metastatic disease at this location is a concern.    ASSESSMENT & PLAN: 70 year old African-American male, with past medical history of hypertension, history of stroke, coronary artery disease, who presents with progressive dysphasia.  1. Distal esophagus squamous cell carcinoma, TxNxM1 with lung, bone and liver mets -I previously reviewed his EGD, CT and PET scan findings and the biopsy results in great details with patient and his daughter. -I discussed his right iliac bone biopsy results with patient and his son, unfortunately biopsy confirmed metastatic squamous cell carcinoma. -We reviewed the natural course of esophagitis cancer, and the incurable nature of his disease. We emphasized the goal of therapy is palliation and prolonged his life. -He has completed a short course of palliative radiation to his primary tumor, and now receiving palliative radiation to his pelvic bone metastasis -I discussed his restaging PET scan from 12/20/2015, which unfortunately showed disease progression in his bone and lung, and the new liver metastasis. -He is clinically doing well, with good organ  functions and performance status, would be a candidate for further treatment -I discussed limited third line systemic treatment options, including docetaxel, irinotecan, etc., however the  response rate is likely going to be low. Recently immunotherapy, especially PD1 inhibitor has been test is in esophageal cancer, and showed promising results. The response rate has been in 10-20% rage, and up to 40% with Nivo-ipilumumab, response rate is slightly higher in PD-L1 expressed tumor, but has been seen in PT-L1 negative tumor also. I recommend him to try Keytruda as next line therapy -He tolerated his first cycle of treatment very well. He will continue with the second cycle today    2. Seizure -He is compliant with Dilantin.  -He follows with neurologist Dr. Delice Lesch   3. HTN, CAD, history of CVA -Follow up with primary care physician -We'll watch his blood pressure closely when he is on chemotherapy, normal lately.  4. 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 Hemoglobin 9.7 today. -I checked ferritin and iron level on 04/18/15 and 05/31/2015 which were normal.  -His  anemia has gotten worse lately, likely secondary to his pelvic irradiation. -He is not symptomatically, we'll continue observation  5. Hyperglycemia -his BG has been in 200-300 range, probably related to pre-meds no steroids,  history of DM  -I strongly encourage him to follow up with PCP, I will monitor his BG closely when on chemo. BG 142 today.  6. Peripheral neuropathy, G2 -He has mild to moderate neuropathy in his hands from chemo, especially taxol  -We'll continue observe closely. -We'll consider Neurontin if his neuropathy gets worse, especially tingling and pain   Plan -Lab results reviewed with patient, we'll proceed cycle 2 Keytruda today -He will receive a flu shot today -Return to clinic with lab in 3 weeks for cycle 3   All questions were  answered. The patient knows to call the clinic with any problems, questions or concerns.  I spent 25 minutes counseling the patient face to face. The total time spent in the appointment was 30 minutes and more than 50% was on counseling.  This document serves as a record of services personally performed by Truitt Merle, MD. It was created on her behalf by Arlyce Harman, a trained medical scribe. The creation of this record is based on the scribe's personal observations and the provider's statements to them. This document has been checked and approved by the attending provider.     Truitt Merle, MD 03/21/2016

## 2016-03-21 ENCOUNTER — Other Ambulatory Visit (HOSPITAL_BASED_OUTPATIENT_CLINIC_OR_DEPARTMENT_OTHER): Payer: Medicare Other

## 2016-03-21 ENCOUNTER — Ambulatory Visit (HOSPITAL_BASED_OUTPATIENT_CLINIC_OR_DEPARTMENT_OTHER): Payer: Medicare Other | Admitting: Hematology

## 2016-03-21 ENCOUNTER — Telehealth: Payer: Self-pay | Admitting: Hematology

## 2016-03-21 ENCOUNTER — Ambulatory Visit: Payer: Medicare Other

## 2016-03-21 ENCOUNTER — Ambulatory Visit (HOSPITAL_BASED_OUTPATIENT_CLINIC_OR_DEPARTMENT_OTHER): Payer: Medicare Other

## 2016-03-21 ENCOUNTER — Ambulatory Visit: Payer: Medicare Other | Admitting: Hematology

## 2016-03-21 ENCOUNTER — Other Ambulatory Visit: Payer: Medicare Other

## 2016-03-21 ENCOUNTER — Encounter: Payer: Self-pay | Admitting: Hematology

## 2016-03-21 VITALS — BP 140/67 | HR 87 | Temp 98.4°F | Resp 18 | Ht 68.0 in | Wt 135.9 lb

## 2016-03-21 DIAGNOSIS — Z95828 Presence of other vascular implants and grafts: Secondary | ICD-10-CM

## 2016-03-21 DIAGNOSIS — E440 Moderate protein-calorie malnutrition: Secondary | ICD-10-CM

## 2016-03-21 DIAGNOSIS — C7951 Secondary malignant neoplasm of bone: Secondary | ICD-10-CM | POA: Diagnosis not present

## 2016-03-21 DIAGNOSIS — Z5112 Encounter for antineoplastic immunotherapy: Secondary | ICD-10-CM

## 2016-03-21 DIAGNOSIS — C155 Malignant neoplasm of lower third of esophagus: Secondary | ICD-10-CM

## 2016-03-21 DIAGNOSIS — I10 Essential (primary) hypertension: Secondary | ICD-10-CM | POA: Diagnosis not present

## 2016-03-21 DIAGNOSIS — D630 Anemia in neoplastic disease: Secondary | ICD-10-CM

## 2016-03-21 DIAGNOSIS — D6481 Anemia due to antineoplastic chemotherapy: Secondary | ICD-10-CM

## 2016-03-21 DIAGNOSIS — C159 Malignant neoplasm of esophagus, unspecified: Secondary | ICD-10-CM

## 2016-03-21 DIAGNOSIS — D649 Anemia, unspecified: Secondary | ICD-10-CM

## 2016-03-21 DIAGNOSIS — I2510 Atherosclerotic heart disease of native coronary artery without angina pectoris: Secondary | ICD-10-CM

## 2016-03-21 DIAGNOSIS — G620 Drug-induced polyneuropathy: Secondary | ICD-10-CM

## 2016-03-21 LAB — COMPREHENSIVE METABOLIC PANEL
ALT: 15 U/L (ref 0–55)
ANION GAP: 10 meq/L (ref 3–11)
AST: 56 U/L — AB (ref 5–34)
Albumin: 2.6 g/dL — ABNORMAL LOW (ref 3.5–5.0)
Alkaline Phosphatase: 566 U/L — ABNORMAL HIGH (ref 40–150)
BILIRUBIN TOTAL: 0.63 mg/dL (ref 0.20–1.20)
BUN: 9.8 mg/dL (ref 7.0–26.0)
CO2: 29 meq/L (ref 22–29)
CREATININE: 0.7 mg/dL (ref 0.7–1.3)
Calcium: 8.6 mg/dL (ref 8.4–10.4)
Chloride: 99 mEq/L (ref 98–109)
EGFR: >90 mL/min/{1.73_m2} (ref >90)
GLUCOSE: 142 mg/dL — AB (ref 70–140)
Potassium: 3.5 mEq/L (ref 3.5–5.1)
Sodium: 138 mEq/L (ref 136–145)
TOTAL PROTEIN: 8 g/dL (ref 6.4–8.3)

## 2016-03-21 LAB — CBC & DIFF AND RETIC
BASO%: 0.2 % (ref 0.0–2.0)
Basophils Absolute: 0 10*3/uL (ref 0.0–0.1)
EOS ABS: 0.1 10*3/uL (ref 0.0–0.5)
EOS%: 0.9 % (ref 0.0–7.0)
HCT: 28.6 % — ABNORMAL LOW (ref 38.4–49.9)
HEMOGLOBIN: 9.7 g/dL — AB (ref 13.0–17.1)
Immature Retic Fract: 9.4 % (ref 3.00–10.60)
LYMPH%: 11 % — AB (ref 14.0–49.0)
MCH: 31.7 pg (ref 27.2–33.4)
MCHC: 33.9 g/dL (ref 32.0–36.0)
MCV: 93.5 fL (ref 79.3–98.0)
MONO#: 0.7 10*3/uL (ref 0.1–0.9)
MONO%: 10.3 % (ref 0.0–14.0)
NEUT%: 77.6 % — ABNORMAL HIGH (ref 39.0–75.0)
NEUTROS ABS: 4.9 10*3/uL (ref 1.5–6.5)
Platelets: 131 10*3/uL — ABNORMAL LOW (ref 140–400)
RBC: 3.06 10*6/uL — ABNORMAL LOW (ref 4.20–5.82)
RDW: 15 % — ABNORMAL HIGH (ref 11.0–14.6)
Retic %: 1.37 % (ref 0.80–1.80)
Retic Ct Abs: 41.92 10*3/uL (ref 34.80–93.90)
WBC: 6.3 10*3/uL (ref 4.0–10.3)
lymph#: 0.7 10*3/uL — ABNORMAL LOW (ref 0.9–3.3)

## 2016-03-21 LAB — IRON AND TIBC
%SAT: 45 % (ref 20–55)
Iron: 72 ug/dL (ref 42–163)
TIBC: 161 ug/dL — ABNORMAL LOW (ref 202–409)
UIBC: 89 ug/dL — ABNORMAL LOW (ref 117–376)

## 2016-03-21 LAB — FERRITIN: FERRITIN: 773 ng/mL — AB (ref 22–316)

## 2016-03-21 MED ORDER — HEPARIN SOD (PORK) LOCK FLUSH 100 UNIT/ML IV SOLN
500.0000 [IU] | Freq: Once | INTRAVENOUS | Status: AC | PRN
  Administered 2016-03-21: 500 [IU]
  Filled 2016-03-21: qty 5

## 2016-03-21 MED ORDER — HEPARIN SOD (PORK) LOCK FLUSH 100 UNIT/ML IV SOLN
500.0000 [IU] | Freq: Once | INTRAVENOUS | Status: DC | PRN
  Filled 2016-03-21: qty 5

## 2016-03-21 MED ORDER — INFLUENZA VAC SPLIT QUAD 0.5 ML IM SUSY
0.5000 mL | PREFILLED_SYRINGE | Freq: Once | INTRAMUSCULAR | Status: DC
  Filled 2016-03-21: qty 0.5

## 2016-03-21 MED ORDER — SODIUM CHLORIDE 0.9 % IV SOLN
Freq: Once | INTRAVENOUS | Status: AC
  Administered 2016-03-21: 15:00:00 via INTRAVENOUS

## 2016-03-21 MED ORDER — SODIUM CHLORIDE 0.9% FLUSH
10.0000 mL | INTRAVENOUS | Status: DC | PRN
  Administered 2016-03-21: 10 mL
  Filled 2016-03-21: qty 10

## 2016-03-21 MED ORDER — SODIUM CHLORIDE 0.9 % IJ SOLN
10.0000 mL | INTRAMUSCULAR | Status: DC | PRN
  Filled 2016-03-21: qty 10

## 2016-03-21 MED ORDER — ALTEPLASE 2 MG IJ SOLR
2.0000 mg | Freq: Once | INTRAMUSCULAR | Status: DC | PRN
  Filled 2016-03-21: qty 2

## 2016-03-21 MED ORDER — SODIUM CHLORIDE 0.9 % IV SOLN
200.0000 mg | Freq: Once | INTRAVENOUS | Status: AC
  Administered 2016-03-21: 200 mg via INTRAVENOUS
  Filled 2016-03-21: qty 8

## 2016-03-21 MED ORDER — SODIUM CHLORIDE 0.9 % IJ SOLN
10.0000 mL | INTRAMUSCULAR | Status: AC | PRN
  Administered 2016-03-21: 10 mL
  Filled 2016-03-21: qty 10

## 2016-03-21 NOTE — Telephone Encounter (Signed)
Appointments scheduled per 11/3 LOS. Patient given AVS report and calendar of future scheduled appointments.

## 2016-03-21 NOTE — Progress Notes (Signed)
D/c to md and then infusion

## 2016-03-21 NOTE — Progress Notes (Signed)
Pt saw Dr. Burr Medico today prior to chemo.  OK to treat with AST  56 as per md.

## 2016-03-21 NOTE — Patient Instructions (Signed)
[facility] Discharge Instructions for Patients Receiving Chemotherapy  Today you received the following chemotherapy agents: keytruda  To help prevent nausea and vomiting after your treatment, we encourage you to take your nausea medication as directed.   If you develop nausea and vomiting that is not controlled by your nausea medication, call the clinic.   BELOW ARE SYMPTOMS THAT SHOULD BE REPORTED IMMEDIATELY:  *FEVER GREATER THAN 100.5 F  *CHILLS WITH OR WITHOUT FEVER  NAUSEA AND VOMITING THAT IS NOT CONTROLLED WITH YOUR NAUSEA MEDICATION  *UNUSUAL SHORTNESS OF BREATH  *UNUSUAL BRUISING OR BLEEDING  TENDERNESS IN MOUTH AND THROAT WITH OR WITHOUT PRESENCE OF ULCERS  *URINARY PROBLEMS  *BOWEL PROBLEMS  UNUSUAL RASH Items with * indicate a potential emergency and should be followed up as soon as possible.  Feel free to call the clinic you have any questions or concerns. The clinic phone number is [phone].  Please show the CHEMO ALERT CARD at check-in to the Emergency Department and triage nurse.   

## 2016-03-24 ENCOUNTER — Telehealth: Payer: Self-pay | Admitting: *Deleted

## 2016-03-24 NOTE — Telephone Encounter (Signed)
Per LOS I have schedueld

## 2016-04-01 ENCOUNTER — Telehealth: Payer: Self-pay | Admitting: *Deleted

## 2016-04-01 NOTE — Telephone Encounter (Signed)
Received call from pt's wife stating pt is having trouble with appetite & feels weak.  He denies temp. Called & spoke to pt & he is trying to eat but appetite is decreased.  He is drinking Boost 1/day & was encouraged to do more if he can & also encouraged to drink as much fluids as he can. He denies nausea or vomiting.  He is to call if he is doing worse but has a f/u appt soon.

## 2016-04-11 ENCOUNTER — Encounter: Payer: Self-pay | Admitting: Nurse Practitioner

## 2016-04-11 ENCOUNTER — Ambulatory Visit: Payer: Medicare Other

## 2016-04-11 ENCOUNTER — Ambulatory Visit (HOSPITAL_BASED_OUTPATIENT_CLINIC_OR_DEPARTMENT_OTHER): Payer: Medicare Other | Admitting: Nurse Practitioner

## 2016-04-11 ENCOUNTER — Ambulatory Visit (HOSPITAL_BASED_OUTPATIENT_CLINIC_OR_DEPARTMENT_OTHER): Payer: Medicare Other

## 2016-04-11 ENCOUNTER — Telehealth: Payer: Self-pay | Admitting: Hematology

## 2016-04-11 ENCOUNTER — Other Ambulatory Visit (HOSPITAL_BASED_OUTPATIENT_CLINIC_OR_DEPARTMENT_OTHER): Payer: Medicare Other

## 2016-04-11 VITALS — BP 119/60 | HR 87 | Temp 98.3°F | Resp 18 | Ht 68.0 in | Wt 125.8 lb

## 2016-04-11 DIAGNOSIS — C159 Malignant neoplasm of esophagus, unspecified: Secondary | ICD-10-CM

## 2016-04-11 DIAGNOSIS — Z23 Encounter for immunization: Secondary | ICD-10-CM

## 2016-04-11 DIAGNOSIS — C7951 Secondary malignant neoplasm of bone: Secondary | ICD-10-CM | POA: Diagnosis not present

## 2016-04-11 DIAGNOSIS — Z95828 Presence of other vascular implants and grafts: Secondary | ICD-10-CM

## 2016-04-11 DIAGNOSIS — C155 Malignant neoplasm of lower third of esophagus: Secondary | ICD-10-CM | POA: Diagnosis not present

## 2016-04-11 DIAGNOSIS — R945 Abnormal results of liver function studies: Secondary | ICD-10-CM | POA: Diagnosis not present

## 2016-04-11 DIAGNOSIS — R160 Hepatomegaly, not elsewhere classified: Secondary | ICD-10-CM

## 2016-04-11 LAB — CBC & DIFF AND RETIC
BASO%: 0.3 % (ref 0.0–2.0)
BASOS ABS: 0 10*3/uL (ref 0.0–0.1)
EOS%: 1.5 % (ref 0.0–7.0)
Eosinophils Absolute: 0.1 10*3/uL (ref 0.0–0.5)
HEMATOCRIT: 30.1 % — AB (ref 38.4–49.9)
HEMOGLOBIN: 10.1 g/dL — AB (ref 13.0–17.1)
Immature Retic Fract: 8.8 % (ref 3.00–10.60)
LYMPH%: 14.6 % (ref 14.0–49.0)
MCH: 30.8 pg (ref 27.2–33.4)
MCHC: 33.6 g/dL (ref 32.0–36.0)
MCV: 91.8 fL (ref 79.3–98.0)
MONO#: 0.5 10*3/uL (ref 0.1–0.9)
MONO%: 7.1 % (ref 0.0–14.0)
NEUT#: 5.6 10*3/uL (ref 1.5–6.5)
NEUT%: 76.5 % — AB (ref 39.0–75.0)
PLATELETS: 150 10*3/uL (ref 140–400)
RBC: 3.28 10*6/uL — ABNORMAL LOW (ref 4.20–5.82)
RDW: 15.1 % — ABNORMAL HIGH (ref 11.0–14.6)
RETIC CT ABS: 30.5 10*3/uL — AB (ref 34.80–93.90)
Retic %: 0.93 % (ref 0.80–1.80)
WBC: 7.3 10*3/uL (ref 4.0–10.3)
lymph#: 1.1 10*3/uL (ref 0.9–3.3)

## 2016-04-11 LAB — COMPREHENSIVE METABOLIC PANEL
ALBUMIN: 2.6 g/dL — AB (ref 3.5–5.0)
ALK PHOS: 591 U/L — AB (ref 40–150)
ALT: 24 U/L (ref 0–55)
AST: 118 U/L — ABNORMAL HIGH (ref 5–34)
Anion Gap: 12 mEq/L — ABNORMAL HIGH (ref 3–11)
BUN: 14.1 mg/dL (ref 7.0–26.0)
CALCIUM: 9.1 mg/dL (ref 8.4–10.4)
CO2: 24 mEq/L (ref 22–29)
Chloride: 103 mEq/L (ref 98–109)
Creatinine: 0.7 mg/dL (ref 0.7–1.3)
GLUCOSE: 161 mg/dL — AB (ref 70–140)
POTASSIUM: 3.8 meq/L (ref 3.5–5.1)
SODIUM: 139 meq/L (ref 136–145)
Total Bilirubin: 0.72 mg/dL (ref 0.20–1.20)
Total Protein: 8.2 g/dL (ref 6.4–8.3)

## 2016-04-11 MED ORDER — SODIUM CHLORIDE 0.9 % IJ SOLN
10.0000 mL | INTRAMUSCULAR | Status: DC | PRN
  Administered 2016-04-11: 10 mL via INTRAVENOUS
  Filled 2016-04-11: qty 10

## 2016-04-11 MED ORDER — INFLUENZA VAC SPLIT QUAD 0.5 ML IM SUSY
0.5000 mL | PREFILLED_SYRINGE | Freq: Once | INTRAMUSCULAR | Status: DC
  Filled 2016-04-11: qty 0.5

## 2016-04-11 MED ORDER — HEPARIN SOD (PORK) LOCK FLUSH 100 UNIT/ML IV SOLN
500.0000 [IU] | Freq: Once | INTRAVENOUS | Status: AC
  Administered 2016-04-11: 500 [IU] via INTRAVENOUS
  Filled 2016-04-11: qty 5

## 2016-04-11 MED ORDER — SODIUM CHLORIDE 0.9% FLUSH
10.0000 mL | INTRAVENOUS | Status: DC | PRN
  Administered 2016-04-11: 10 mL via INTRAVENOUS
  Filled 2016-04-11: qty 10

## 2016-04-11 MED ORDER — INFLUENZA VAC SPLIT QUAD 0.5 ML IM SUSY
0.5000 mL | PREFILLED_SYRINGE | Freq: Once | INTRAMUSCULAR | Status: AC
  Administered 2016-04-11: 0.5 mL via INTRAMUSCULAR
  Filled 2016-04-11: qty 0.5

## 2016-04-11 NOTE — Telephone Encounter (Signed)
Message sent to chemo scheduler to be added per 04/11/16 los. Appointments scheduled per 04/11/16 los. AVS report and appointment schedule given to patient, per 04/11/16 los.

## 2016-04-11 NOTE — Progress Notes (Signed)
[place] OFFICE PROGRESS NOTE   Diagnosis:  Esophagus cancer Oncology History   Squamous cell esophageal cancer   Staging form: Esophagus - Squamous Cell Carcinoma, AJCC 7th Edition     Clinical: Stage IV ([place], N0, M1) - Unsigned      Squamous cell esophageal cancer ([place])   01/02/2015 Initial Diagnosis    Squamous cell esophageal cancer     01/02/2015 Procedure    EGD by Dr. Benson Norway showed a large, fungating mass with no bleeding and with stigmata met of recent bleeding was found in the third of the esophagus. The mass was completely obstructing and circumferential. The stomach and examined duodenum were normal.     01/02/2015 Imaging    CT CAP showed 9 cm segment of irregular his social worker thickening. Single enlarged subcarinal lymph node, bilateral pulmonary nodules (3), 73m right middle lobe next to right atrium, 3 RUL and a 7 mm lingular lung nodules.      01/02/2015 Initial Biopsy    Esophageal mass biopsy showed poorly differentiated squamous cell carcinoma.     01/18/2015 Imaging    PET scan showed hypermetabolic esophageal mass, subcarinal and possible right hilar adenopathy, several lung nodules, and a hypermetabolic bone lesion in right ilium, compatible with metastatic disease.      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     08/09/2015 -  Chemotherapy    second line chemo Carboplatin AUC 5 and paclitaxel 1 75 mg/m, every 3 weeks     10/04/2015 Imaging    PET scan showed interval response to therapy,  decreased in hypermetabolism of bilateral pulmonary metastases and bone metastasis, no new lesions.     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 MMinksreturns as scheduled. He completed cycle 2 of Pembrolizumab 03/21/2016. He denies nausea/vomiting. No mouth sores. No diarrhea. No rash. He denies dysphagia. No cough or fever. He notes that he fatigues easily. He continues to have right hip pain.  Objective:  Vital signs in last 24 hours:  Blood pressure 119/60, pulse 87, temperature 98.3 F (36.8 C), temperature source Oral, resp. rate 18, height _0  (1.727 m), weight 125 lb 12.8 oz (57.1 kg), SpO2 100 %.    HEENT: No thrush or ulcers. Lymphatics: No palpable cervical or supra-clavicular lymph nodes. Resp: Lungs with faint scattered wheezes. No respiratory distress. Cardio: Regular rate and rhythm. GI: Liver is palpable right upper abdomen and extending across midline. Vascular: No leg edema. Skin: No rash. Port-A-Cath without erythema.    Lab Results:  Lab Results  Component Value Date   WBC 7.3 04/11/2016   HGB 10.1 (L) 04/11/2016   HCT 30.1 (L) 04/11/2016   MCV 91.8 04/11/2016  PLT 150 04/11/2016   NEUTROABS 5.6 04/11/2016   AST 118, ALT 24, bilirubin 0.72, alkaline phosphatase 591 Imaging:  No results found.  Medications: I have reviewed the patient's current medications.  Assessment/Plan: 1. Distal esophagus squamous cell carcinoma, TxNxM1 with lung and bone mets; status post FOLFOX 02/22/2015 through 07/19/2015; restaging PET scan 07/30/2015 with evidence of progression; cycle 1 carboplatin/Taxol 08/09/2015, cycle 2 carboplatin/Taxol 08/30/2015,  cycle 3 carboplatin/Taxol 09/20/2015; restaging PET scan 10/04/2015 showed improvement; cycle 4 carboplatin/Taxol 10/11/2015; cycle 5 carboplatin/Taxol 11/01/2015, cycle 6 12/10/2015; restaging PET scan 12/20/2015 with evidence of progression; status post palliative radiation to right pelvic bone metastasis for pain control; initiation of Pembrolizumab 01/28/2016. 2. Seizure. He continues Dilantin. 3. Hypertension, CAD, history of CVA 4. Hyperglycemia    Disposition: Andre Guzman appears stable. He has completed 2 cycles of pembrolizumab. He has lost 10 pounds over the past 3 weeks and liver is significantly enlarged on exam today. AST is elevated, approximately 3-1/2 times normal. I reviewed the above with Dr. Benay Spice. We decided to hold today's treatment. He will return for labs and a follow-up visit in one week.  25 minutes were spent face-to-face at today's visit with the majority of that time involved in counseling/coordination of care.    Ned Card ANP/GNP-BC   04/11/2016  2:16 PM

## 2016-04-14 ENCOUNTER — Telehealth: Payer: Self-pay | Admitting: Hematology

## 2016-04-14 NOTE — Telephone Encounter (Signed)
Spoke with patient's wife [name], regarding change of appointment time for 04/18/16 los. Appointment was confirmed with Wendi Maya. 04/14/16

## 2016-04-15 IMAGING — PT NM PET TUM IMG RESTAG (PS) SKULL BASE T - THIGH
8 series · 25 of 25 positions shown · non-contrast
Comparison: PET-CT dated 05/29/2015

CLINICAL DATA: Subsequent treatment strategy for squamous cell
esophageal cancer.

EXAM:
NUCLEAR MEDICINE PET SKULL BASE TO THIGH
TECHNIQUE: 7.9 mCi F-18 FDG was injected intravenously. Full-ring PET imaging
was performed from the skull base to thigh after the radiotracer. CT
data was obtained and used for attenuation correction and anatomic
localization.
FASTING BLOOD GLUCOSE:  Value: 88 mg/dl

[Series 4: pet sk_thigh ac · axial · 5.0mm · 4.07mm/px · z∈[-1256,-396]mm · 5 of 216 slices shown]
[im 1/216]
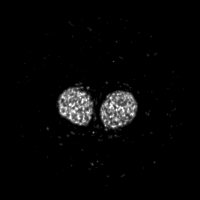
[im 54/216]
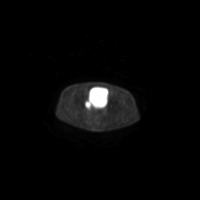
[im 108/216]
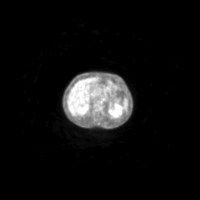
[im 162/216]
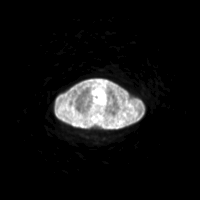
[im 216/216]
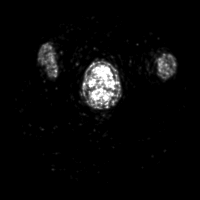

[Series 5: ct sk_thigh 5.0 b31f · axial · 5.0mm · 0.98mm/px · z∈[-1256,-396]mm · 5 of 216 slices shown]
[im 1/216]
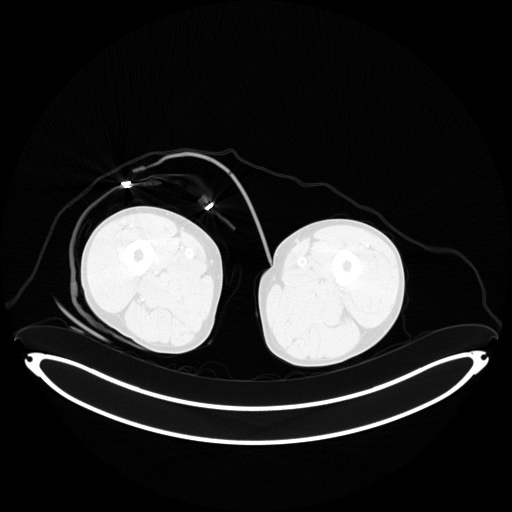
[im 54/216]
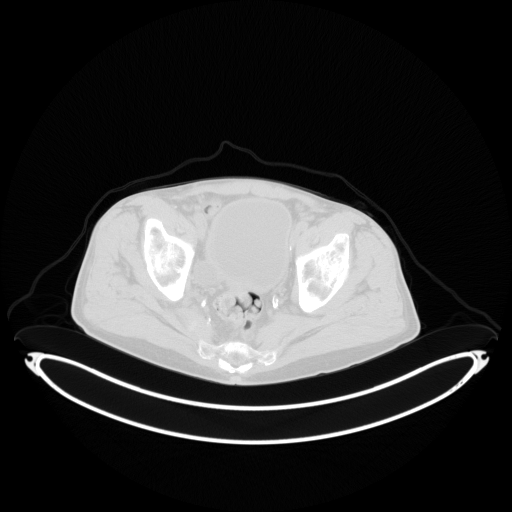
[im 108/216]
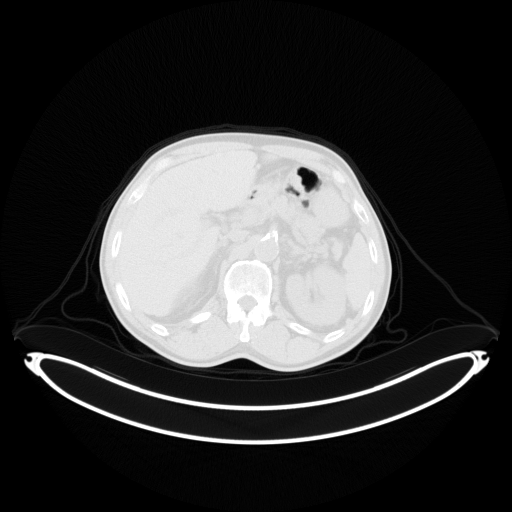
[im 162/216]
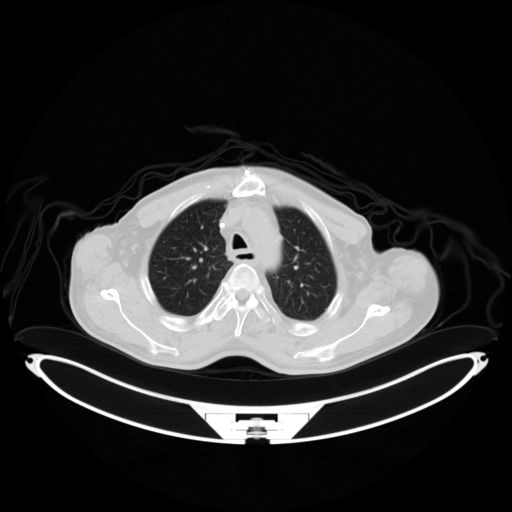
[im 216/216]
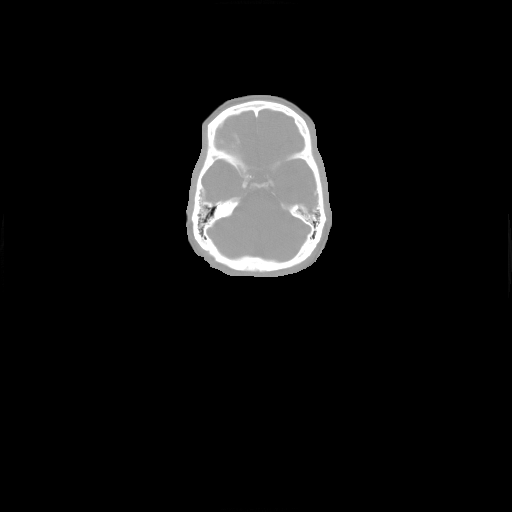

[Series 7: ct sk_thigh 5.0 (id) lung_bone · axial · 5.0mm · 0.57mm/px · 1 of 60 slices shown]
[im 1/60  bone]
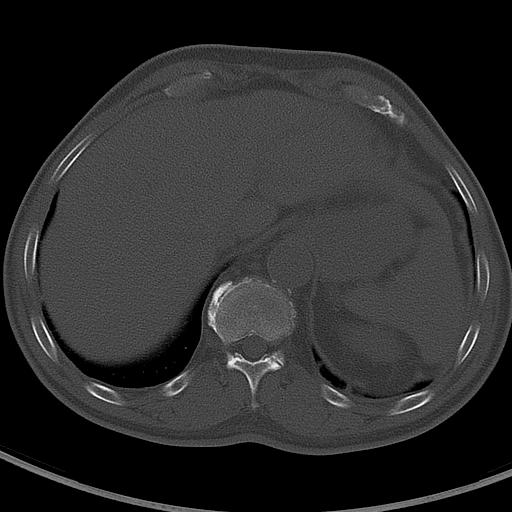

[Series 9: pet sk_thigh nac · axial · 5.0mm · 4.07mm/px · z∈[-1256,-396]mm · 5 of 216 slices shown]
[im 1/216]
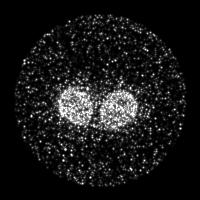
[im 54/216]
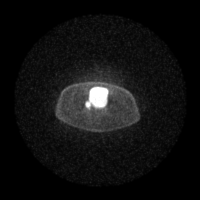
[im 108/216]
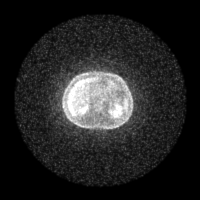
[im 162/216]
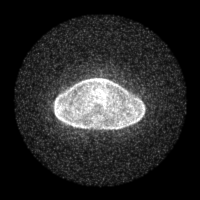
[im 216/216]
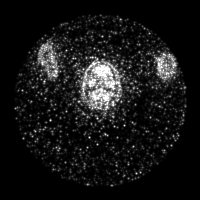

[Series 604: mip collection<mip range(1)> · coronal · 1.79mm/px · 1 of 32 slices shown]
[im 1/32]
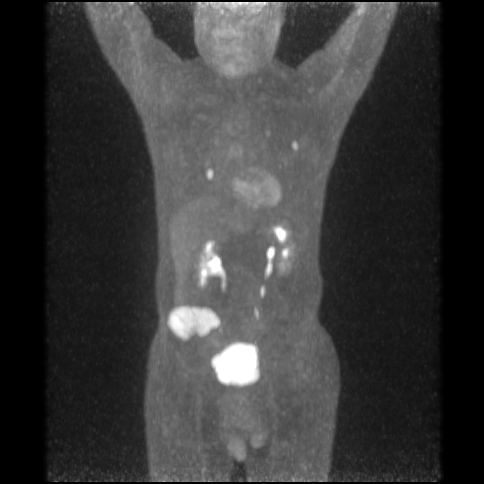

[Series 605: range-ct sk_thigh 5.0 (id)<alpha range> · 2 of 67 slices shown (1 of 2)]
[im 1/67]
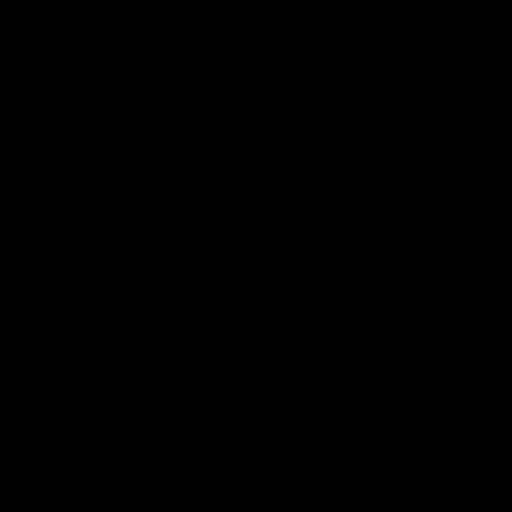
[im 67/67]
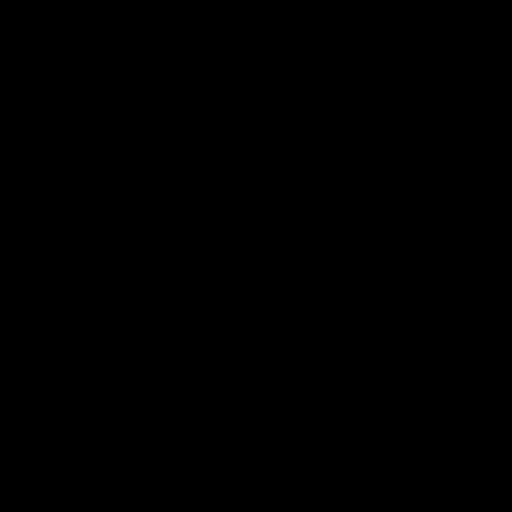

[Series 606: range-ct sk_thigh 5.0 (id)<alpha range> · 5 of 204 slices shown (2 of 2)]
[im 1/204]
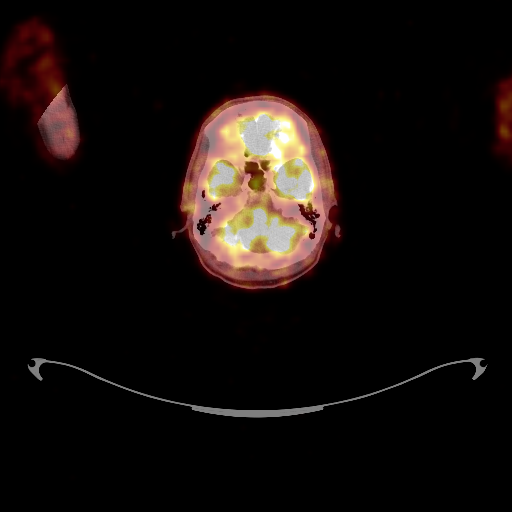
[im 51/204]
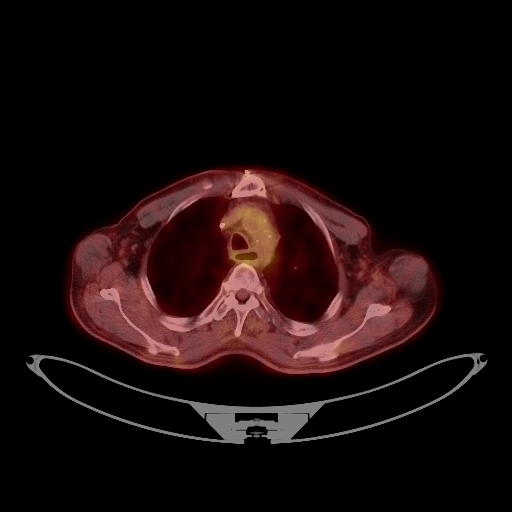
[im 102/204]
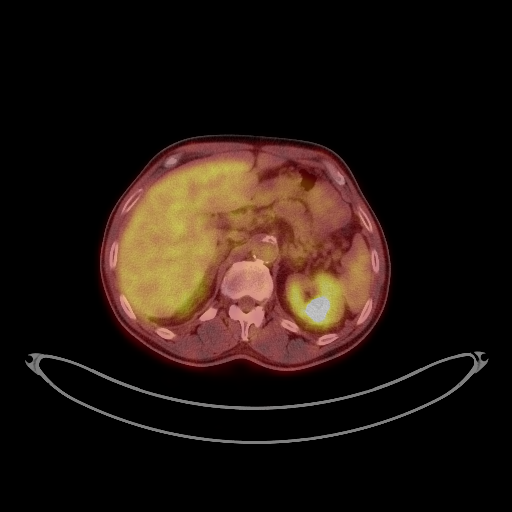
[im 153/204]
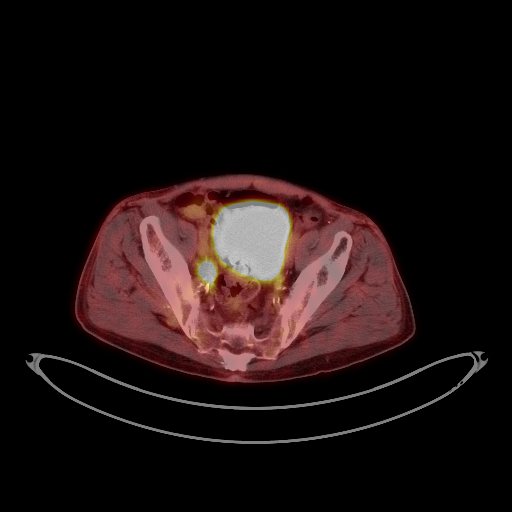
[im 204/204]
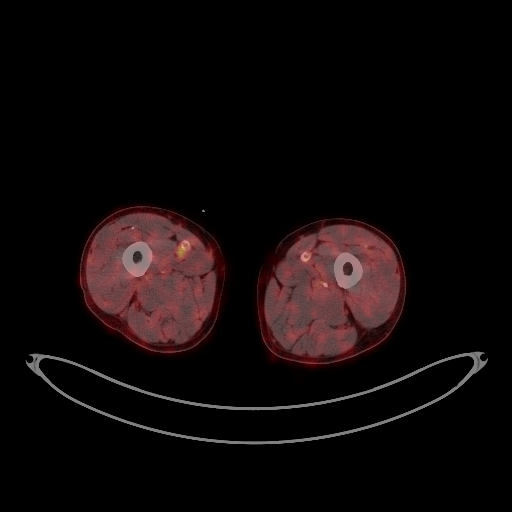

[Series 1123: results mm oncology reading · 1.16mm/px · 1 of 6 slices shown]
[im 1/6]
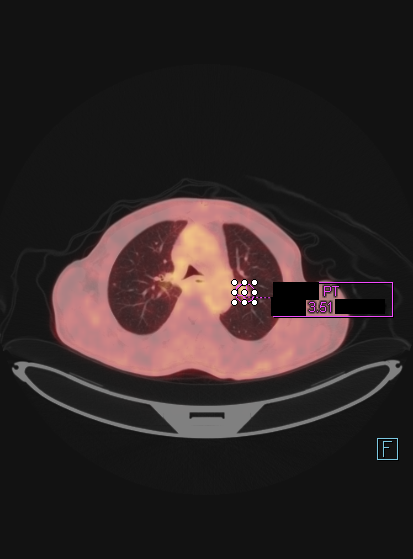

[25 of 25 positions shown; findings below may reference images not displayed]

FINDINGS: NECK

No hypermetabolic lymph nodes in the neck.

CHEST

Bilateral pulmonary nodules/ metastases. Dominant 15 x 11 mm nodule
in the medial right middle lobe (series 7/ image 42), max SUV 11.6,
previously 15 x 10 mm with max SUV 11.1, unchanged. Additional 9 mm
nodule in the posterior left upper lobe (series 7/image 30) with max
SUV 7.7, previously 8 mm with max SUV 5.5, mildly increased.

Mild hypermetabolism in the left suprahilar region, max SUV 3.5,
new.

The heart is top-normal in size. No pericardial effusion.
Three-vessel coronary atherosclerosis. Atherosclerotic
calcifications of the aortic arch. Right chest port terminates in
the right atrium.

Residual mild hypermetabolism involving the mid esophagus, max SUV
5.1, previously 5.4.

ABDOMEN/PELVIS

No abnormal hypermetabolic activity within the liver, pancreas,
adrenal glands, or spleen.

Atherosclerotic calcifications of the abdominal aorta and branch
vessels. Prostatomegaly, with enlargement of the central gland which
indents the base of the bladder. Mildly thick-walled bladder,
suggesting chronic outlet obstruction.

No hypermetabolic lymph nodes in the abdomen or pelvis.

SKELETON

Osseous metastasis involving the right iliac bone (series 5/ image
143), max SUV 18.4, previously 22.0.

Sclerotic metastasis involving the right posterior 12th rib (series
5/image 115), max SUV 5.6, previously 4.6.
IMPRESSION: Stable mild residual hypermetabolism involving the mid esophagus.

Possible mild progression of bilateral pulmonary nodules/metastases,
with new hypermetabolism in the left suprahilar region and possible
mild progression of a 9 mm posterior left upper lobe nodule.
Additional bilateral pulmonary nodules/metastases are grossly
unchanged, including the dominant 15 x 11 mm nodule in the medial
right middle lobe.

Stable osseous metastases involving the right posterior 12th rib and
right iliac bone.

## 2016-04-18 ENCOUNTER — Other Ambulatory Visit (HOSPITAL_BASED_OUTPATIENT_CLINIC_OR_DEPARTMENT_OTHER): Payer: Medicare Other

## 2016-04-18 ENCOUNTER — Ambulatory Visit (HOSPITAL_BASED_OUTPATIENT_CLINIC_OR_DEPARTMENT_OTHER): Payer: Medicare Other

## 2016-04-18 ENCOUNTER — Other Ambulatory Visit: Payer: Medicare Other

## 2016-04-18 ENCOUNTER — Ambulatory Visit: Payer: Medicare Other | Admitting: Nurse Practitioner

## 2016-04-18 ENCOUNTER — Ambulatory Visit: Payer: Medicare Other

## 2016-04-18 ENCOUNTER — Telehealth: Payer: Self-pay

## 2016-04-18 ENCOUNTER — Ambulatory Visit (HOSPITAL_BASED_OUTPATIENT_CLINIC_OR_DEPARTMENT_OTHER): Payer: Medicare Other | Admitting: Nurse Practitioner

## 2016-04-18 VITALS — BP 101/54 | HR 89 | Temp 98.4°F | Resp 18 | Ht 68.0 in | Wt 125.0 lb

## 2016-04-18 VITALS — BP 101/69

## 2016-04-18 DIAGNOSIS — C7800 Secondary malignant neoplasm of unspecified lung: Secondary | ICD-10-CM

## 2016-04-18 DIAGNOSIS — Z5112 Encounter for antineoplastic immunotherapy: Secondary | ICD-10-CM

## 2016-04-18 DIAGNOSIS — C159 Malignant neoplasm of esophagus, unspecified: Secondary | ICD-10-CM

## 2016-04-18 DIAGNOSIS — I10 Essential (primary) hypertension: Secondary | ICD-10-CM | POA: Diagnosis not present

## 2016-04-18 DIAGNOSIS — C155 Malignant neoplasm of lower third of esophagus: Secondary | ICD-10-CM

## 2016-04-18 DIAGNOSIS — C7951 Secondary malignant neoplasm of bone: Secondary | ICD-10-CM | POA: Diagnosis not present

## 2016-04-18 LAB — CBC WITH DIFFERENTIAL/PLATELET
BASO%: 0.3 % (ref 0.0–2.0)
Basophils Absolute: 0 10*3/uL (ref 0.0–0.1)
EOS%: 1.4 % (ref 0.0–7.0)
Eosinophils Absolute: 0.1 10*3/uL (ref 0.0–0.5)
HEMATOCRIT: 29.9 % — AB (ref 38.4–49.9)
HEMOGLOBIN: 10.1 g/dL — AB (ref 13.0–17.1)
LYMPH#: 1 10*3/uL (ref 0.9–3.3)
LYMPH%: 12.4 % — ABNORMAL LOW (ref 14.0–49.0)
MCH: 30.6 pg (ref 27.2–33.4)
MCHC: 33.8 g/dL (ref 32.0–36.0)
MCV: 90.6 fL (ref 79.3–98.0)
MONO#: 0.6 10*3/uL (ref 0.1–0.9)
MONO%: 8.4 % (ref 0.0–14.0)
NEUT%: 77.5 % — ABNORMAL HIGH (ref 39.0–75.0)
NEUTROS ABS: 5.9 10*3/uL (ref 1.5–6.5)
Platelets: 142 10*3/uL (ref 140–400)
RBC: 3.3 10*6/uL — ABNORMAL LOW (ref 4.20–5.82)
RDW: 15.1 % — AB (ref 11.0–14.6)
WBC: 7.6 10*3/uL (ref 4.0–10.3)

## 2016-04-18 LAB — COMPREHENSIVE METABOLIC PANEL
ALBUMIN: 2.6 g/dL — AB (ref 3.5–5.0)
ALK PHOS: 640 U/L — AB (ref 40–150)
ALT: 23 U/L (ref 0–55)
AST: 116 U/L — AB (ref 5–34)
Anion Gap: 11 mEq/L (ref 3–11)
BUN: 14.8 mg/dL (ref 7.0–26.0)
CALCIUM: 9.1 mg/dL (ref 8.4–10.4)
CO2: 24 mEq/L (ref 22–29)
CREATININE: 0.7 mg/dL (ref 0.7–1.3)
Chloride: 104 mEq/L (ref 98–109)
EGFR: >90 mL/min/{1.73_m2} (ref >90)
GLUCOSE: 140 mg/dL (ref 70–140)
POTASSIUM: 3.7 meq/L (ref 3.5–5.1)
SODIUM: 139 meq/L (ref 136–145)
Total Bilirubin: 0.78 mg/dL (ref 0.20–1.20)
Total Protein: 8 g/dL (ref 6.4–8.3)

## 2016-04-18 MED ORDER — SODIUM CHLORIDE 0.9 % IV SOLN
Freq: Once | INTRAVENOUS | Status: AC
  Administered 2016-04-18: 15:00:00 via INTRAVENOUS

## 2016-04-18 MED ORDER — HEPARIN SOD (PORK) LOCK FLUSH 100 UNIT/ML IV SOLN
500.0000 [IU] | Freq: Once | INTRAVENOUS | Status: AC | PRN
  Administered 2016-04-18: 500 [IU]
  Filled 2016-04-18: qty 5

## 2016-04-18 MED ORDER — SODIUM CHLORIDE 0.9% FLUSH
10.0000 mL | INTRAVENOUS | Status: DC | PRN
  Administered 2016-04-18: 10 mL
  Filled 2016-04-18: qty 10

## 2016-04-18 MED ORDER — SODIUM CHLORIDE 0.9 % IV SOLN
200.0000 mg | Freq: Once | INTRAVENOUS | Status: AC
  Administered 2016-04-18: 200 mg via INTRAVENOUS
  Filled 2016-04-18: qty 8

## 2016-04-18 NOTE — Progress Notes (Signed)
Per Orland Penman, per Dr. Burr Medico ok for treatment today with AST of 116

## 2016-04-18 NOTE — Progress Notes (Signed)
[facility] OFFICE PROGRESS NOTE   Diagnosis:  Esophagus cancer Oncology History   Squamous cell esophageal cancer Staging form: Esophagus - Squamous Cell Carcinoma, AJCC 7th Edition Clinical: Stage IV ([place], N0, M1) - Unsigned      Squamous cell esophageal cancer ([place])   01/02/2015 Initial Diagnosis    Squamous cell esophageal cancer     01/02/2015 Procedure    EGD by Dr. Benson Guzman showed a large, fungating mass with no bleeding and with stigmata met of recent bleeding was found in the third of the esophagus. The mass was completely obstructing and circumferential. The stomach and examined duodenum were normal.     01/02/2015 Imaging    CT CAP showed 9 cm segment of irregular his social worker thickening. Single enlarged subcarinal lymph node, bilateral pulmonary nodules (3), 8m right middle lobe next to right atrium, 3 RUL and a 7 mm lingular lung nodules.      01/02/2015 Initial Biopsy    Esophageal mass biopsy showed poorly differentiated squamous cell carcinoma.     01/18/2015 Imaging    PET scan showed hypermetabolic esophageal mass, subcarinal and possible right hilar adenopathy, several lung nodules, and a hypermetabolic bone lesion in right ilium, compatible with metastatic disease.      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     08/09/2015 -  Chemotherapy    second line chemo Carboplatin AUC 5 and paclitaxel 1 75 mg/m, every 3 weeks     10/04/2015 Imaging    PET scan showed interval response to therapy,  decreased in hypermetabolism of bilateral pulmonary metastases and bone metastasis, no new lesions.     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 MCubitreturns as scheduled. He completed cycle 2 Pembrolizumab 03/21/2016. Cycle 3 was held 04/11/2016 due to a progressive increase in liver enzymes and weight loss. He continues to have right hip pain. Appetite is poor. No nausea or vomiting. No diarrhea. No rash. Mild dyspnea on exertion. No cough. No fever. He has persistent numbness involving the hands and feet.  Objective:  Vital signs in last 24 hours:  Blood pressure (!) 101/54, pulse 89, temperature 98.4 F (36.9 C), temperature source Oral, resp. rate 18, height '5\' 8"'$  (1.727 m), weight 125 lb (56.7 kg), SpO2 100 %.    HEENT: No thrush or ulcers. Resp: Lungs clear bilaterally. Cardio: Regular rate and rhythm. GI: Abdomen is soft, mildly distended. Fullness right upper abdomen with associated tenderness. Vascular: No leg edema. Port-A-Cath without erythema.    Lab Results:  Lab Results  Component Value Date   WBC 7.6 04/18/2016   HGB 10.1 (L) 04/18/2016   HCT 29.9 (L) 04/18/2016   MCV 90.6 04/18/2016   PLT 142 04/18/2016  NEUTROABS 5.9 04/18/2016    Imaging:  No results found.  Medications: I have reviewed the patient's current medications.  Assessment/Plan: 1. 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 Seizure. He continues Dilantin. 3. Hypertension, CAD, history of CVA 4. Hyperglycemia   Disposition:Andre Guzman appears stable. He has completed 2 cycles of Pembrolizumab. Liver enzymes are stable. Weight is also stable. We decided to proceed with cycle 3 Pembrolizumab today as scheduled. Andre Guzman recommends a restaging PET scan a few days prior to his next visit in 3 weeks.  He will return for a follow-up visit on 05/08/2016. He will contact the office in the interim with any problems.  Plan reviewed with Andre Guzman.     Andre Guzman ANP/GNP-BC   04/18/2016  2:24 PM

## 2016-04-18 NOTE — Patient Instructions (Signed)
Cancer Center Discharge Instructions for Patients Receiving Chemotherapy  Today you received the following chemotherapy agents: keytruda  To help prevent nausea and vomiting after your treatment, we encourage you to take your nausea medication as directed.   If you develop nausea and vomiting that is not controlled by your nausea medication, call the clinic.   BELOW ARE SYMPTOMS THAT SHOULD BE REPORTED IMMEDIATELY:  *FEVER GREATER THAN 100.5 F  *CHILLS WITH OR WITHOUT FEVER  NAUSEA AND VOMITING THAT IS NOT CONTROLLED WITH YOUR NAUSEA MEDICATION  *UNUSUAL SHORTNESS OF BREATH  *UNUSUAL BRUISING OR BLEEDING  TENDERNESS IN MOUTH AND THROAT WITH OR WITHOUT PRESENCE OF ULCERS  *URINARY PROBLEMS  *BOWEL PROBLEMS  UNUSUAL RASH Items with * indicate a potential emergency and should be followed up as soon as possible.  Feel free to call the clinic you have any questions or concerns. The clinic phone number is [phone].  Please show the CHEMO ALERT CARD at check-in to the Emergency Department and triage nurse.   

## 2016-04-18 NOTE — Telephone Encounter (Signed)
Informed Andre Guzman- pt to stop BP meds if taking them until next visit per Andre Guzman

## 2016-04-23 ENCOUNTER — Telehealth: Payer: Self-pay | Admitting: Hematology

## 2016-04-23 NOTE — Telephone Encounter (Signed)
Per 12/1 los cxd 12/15 appointments and scheduled for 12/21. Spoke with patient wife. Central radiology will call re scan.

## 2016-04-30 DIAGNOSIS — Z1389 Encounter for screening for other disorder: Secondary | ICD-10-CM | POA: Diagnosis not present

## 2016-04-30 DIAGNOSIS — Z0000 Encounter for general adult medical examination without abnormal findings: Secondary | ICD-10-CM | POA: Diagnosis not present

## 2016-04-30 DIAGNOSIS — Z79899 Other long term (current) drug therapy: Secondary | ICD-10-CM | POA: Diagnosis not present

## 2016-04-30 DIAGNOSIS — E46 Unspecified protein-calorie malnutrition: Secondary | ICD-10-CM | POA: Diagnosis not present

## 2016-05-01 NOTE — Progress Notes (Signed)
[facility]  Telephone:([phone]) [phone] [phone]  Clinic Follow up Note   Patient Care Team: Dorothyann Peng, MD as PCP - General (Internal Medicine) Yates Decamp, MD as Consulting Physician (Cardiology) Shirlean Kelly, MD as Consulting Physician (Neurosurgery) Su Grand, MD as Consulting Physician (Urology) Delight Ovens, MD as Consulting Physician (Cardiothoracic Surgery) Malachy Mood, MD as Consulting Physician (Hematology) Wandalee Ferdinand, RN as Registered Nurse Jeani Hawking, MD as Consulting Physician (Gastroenterology) Antony Blackbird, MD as Consulting Physician (Radiation Oncology) 05/08/2016  CHIEF COMPLAINTS:  Follow up esophageal squamous cell carcinoma  Oncology History   Squamous cell esophageal cancer   Staging form: Esophagus - Squamous Cell Carcinoma, AJCC 7th Edition     Clinical: Stage IV (TX, N0, M1) - Unsigned       Squamous cell esophageal cancer (HCC)   01/02/2015 Initial Diagnosis    Squamous cell esophageal cancer      01/02/2015 Procedure    EGD by Dr. Elnoria Howard showed a large, fungating mass with no bleeding and with stigmata met of recent bleeding was found in the third of the esophagus. The mass was completely obstructing and circumferential. The stomach and examined duodenum were normal.      01/02/2015 Imaging    CT CAP showed 9 cm segment of irregular his social worker thickening. Single enlarged subcarinal lymph node, bilateral pulmonary nodules (3), 4mm right middle lobe next to right atrium, 3 RUL and a 7 mm lingular lung nodules.       01/02/2015 Initial Biopsy    Esophageal mass biopsy showed poorly differentiated squamous cell carcinoma.      01/18/2015 Imaging    PET scan showed hypermetabolic esophageal mass, subcarinal and possible right hilar adenopathy, several lung nodules, and a hypermetabolic bone lesion in right ilium, compatible with metastatic disease.       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      08/09/2015 - 12/10/2015 Chemotherapy    second line chemo Carboplatin AUC 5 and paclitaxel 1 75 mg/m, every 3 weeks, stopped due to disease progression       10/04/2015 Imaging    PET scan showed interval response to therapy, decreased in hypermetabolism of bilateral pulmonary metastases and bone metastasis, no new lesions.      12/20/2015 Imaging    Restaging PET showed interval disease progression in lung and bone, newly developed multiple liver metastasis,      01/03/2016 - 01/24/2016 Radiation Therapy    Palliative radiation to right pelvic bone metastasis for pain control      01/28/2016 -  Chemotherapy    Keytruda every 3 weeks        02/28/2016 Miscellaneous    Foundation One results received      05/08/2016 PET scan    PET/CT 05/08/16 IMPRESSION: 1. Significant progression of disease, including within the lungs, liver, and bones. 2. Development of small volume abdominopelvic ascites. 3. New posterior mediastinal nodal metastasis. 4. Right acetabular lesion without complicating fracture. The patient is predisposed to right-sided insufficiency fracture.  HISTORY OF PRESENTING ILLNESS:  Andre Guzman 70 y.o. male is here because of recently diagnosed esophageal squamous cell carcinoma.  He has had dysphagia for one month, with solid food mainly, he is able to keep liquid and soft diet down. He lost about 30 lbs in the the past month, but his weight has been stable in the past week since he started taking boost. He had some chest pain when he swallows. He is  constipated, on stool softener, no bleeding.  He otherwise feels fine. His energy level is fair, he functions very well, no limitation on his physical activities. No nausea, no other pain, cough or other, he drinks about 2 boost a day, soup,and some soft diet.  He was referred to gastroenterologist Dr. Benson Norway by his primary care physician. He underwent EGD which showed a circumferential, obstructing, fungating mass in the third esophagus, biopsy showed poorly differentiated squamous cell carcinoma. CT scan chest abdomen and pelvis showed 3 lung nodules, with the largest 1.2 cm in the right middle lobe next to the right atrium.  CURRENT THERAPY: Keytruda '240mg'$  every 3 weeks, starting 01/28/2016  INTERIM HISTORY Andre Guzman returns for follow up. PET scan on 05/08/16 unfortunately showed significant progression. Wife states he is able to walk to the bed and bathroom, but otherwise does not get up that much from his wheelchair and have no inclination to move. His right pain is getting worse. He has right leg pain. Back pain when he gets up or changes position when he lays down. States he is able to swallow water and food without difficulty. Poor appetite. Tries to drink Ensure. Shortness of breath on exertion. The patient is steadily losing weight. The patient has constipation and his wife gives him Miralax.  MEDICAL HISTORY:  Past Medical History:  Diagnosis Date  . COPD (chronic obstructive pulmonary disease) ([place])   . Coronary artery disease   . Esophageal cancer ([place])   . History of radiation therapy 01/03/16-01/24/16   right pelvis ([place]) 37.5 Gy  . Hypertension   . Pneumonia   . Radiation 01/25/15-02/13/15   35 gray for esophageal cancer  . Seizures ([place])   . Shortness of breath   . Stroke [facility])     SURGICAL HISTORY: Past Surgical History:  Procedure Laterality Date  . CARDIAC CATHETERIZATION    . CARDIAC VALVE SURGERY    . CORONARY ANGIOPLASTY WITH STENT PLACEMENT  07/22/2011  . CORONARY ARTERY  BYPASS GRAFT    . ESOPHAGOGASTRODUODENOSCOPY  01/02/15  . LEFT HEART CATHETERIZATION WITH CORONARY ANGIOGRAM N/A 07/01/2011   Procedure: LEFT HEART CATHETERIZATION WITH CORONARY ANGIOGRAM;  Surgeon: Laverda Page, MD;  Location: [facility] CATH LAB;  Service: Cardiovascular;  Laterality: N/A;  . LEFT HEART CATHETERIZATION WITH CORONARY ANGIOGRAM N/A 10/30/2011   Procedure: LEFT HEART CATHETERIZATION WITH CORONARY ANGIOGRAM;  Surgeon: Laverda Page, MD;  Location[facility] CATH LAB;  Service: Cardiovascular;  Laterality: N/A;  . LOWER EXTREMITY ANGIOGRAM N/A 07/01/2011   Procedure: LOWER EXTREMITY ANGIOGRAM;  Surgeon: Laverda Page, MD;  Location: [facility] CATH LAB;  Service: Cardiovascular;  Laterality: N/A;  . PERCUTANEOUS CORONARY ROTOBLATOR INTERVENTION (PCI-R) N/A 07/22/2011   Procedure: PERCUTANEOUS CORONARY ROTOBLATOR INTERVENTION (PCI-R);  Surgeon: Laverda Page, MD;  Location: [facility] CATH LAB;  Service: Cardiovascular;  Laterality: N/A;    SOCIAL HISTORY: Social History   Social History  . Marital status: Married    Spouse name: N/A  . Number of children: 4  . Years of education: N/A  Social History Main Topics  . Smoking status: Former Smoker    Packs/day: 0.50    Years: 50.00    Types: Cigarettes    Quit date: 12/27/2014  . Smokeless tobacco: Never Used  . Alcohol use 0.0 oz/week     Comment: occasional beer, 1-2 per week, used to drink daily heavily   . Drug use: No  . Sexual activity: Not Asked     Comment: did not ask   Other Topics Concern  . None   Social History Narrative   Married, wife Andre Guzman   Independent in ADLs   # 4 children ( 1 girl and 3 boys)    FAMILY HISTORY: Family History  Problem Relation Age of Onset  . Heart disease Mother   . Hypertension Mother   . Heart disease Sister   . Hypertension Sister   . Heart disease Brother   . Hypertension Daughter     ALLERGIES:  has No Known Allergies.  MEDICATIONS:  Current Outpatient Prescriptions  Medication  Sig Dispense Refill  . aspirin EC 81 MG tablet Take 81 mg by mouth daily.    . feeding supplement (BOOST / RESOURCE BREEZE) LIQD Take 1 Container by mouth 3 (three) times daily between meals. (Patient taking differently: Take 1 Container by mouth 3 (three) times daily between meals. )  0  . mirtazapine (REMERON) 15 MG tablet Take 15 mg by mouth at bedtime.     Marland Kitchen OVER THE COUNTER MEDICATION     . phenytoin (DILANTIN) 100 MG ER capsule Take 2 caps PO every morning, take 3 caps PO every night 150 capsule 11  . polyethylene glycol (MIRALAX / GLYCOLAX) packet Take 17 g by mouth daily as needed.    . pravastatin (PRAVACHOL) 40 MG tablet Take 40 mg by mouth every evening.     . terazosin (HYTRIN) 10 MG capsule Take 10 mg by mouth at bedtime.   0  . albuterol (PROVENTIL HFA;VENTOLIN HFA) 108 (90 BASE) MCG/ACT inhaler Inhale 1-2 puffs into the lungs every 6 (six) hours as needed for wheezing or shortness of breath. Reported on 05/17/2015    . lisinopril (PRINIVIL) 10 MG tablet Take 1 tablet (10 mg total) by mouth daily. (Patient not taking: Reported on 05/08/2016) 30 tablet 1  . magic mouthwash w/lidocaine SOLN Take 5 mLs by mouth 4 (four) times daily. (Patient not taking: Reported on 05/08/2016) 300 mL 0  . megestrol (MEGACE ES) 625 MG/5ML suspension Take 5 mLs (625 mg total) by mouth daily. 150 mL 0  . morphine 20 MG/5ML solution Take 1.3 mLs (5.2 mg total) by mouth every 4 (four) hours as needed for pain. 100 mL 0  . PRESCRIPTION MEDICATION He receives his chemo treatments at the [facility] with Dr. Burr Medico. He is on a 14 day cycle of FOLFOX.    Marland Kitchen prochlorperazine (COMPAZINE) 10 MG tablet Take 1 tablet (10 mg total) by mouth every 6 (six) hours as needed. (Patient not taking: Reported on 05/08/2016) 30 tablet 3   No current facility-administered medications for this visit.     REVIEW OF SYSTEMS:   Constitutional: Denies fevers, chills or abnormal night sweats (+) Fatigue,  weakness Eyes: Denies blurriness of vision, double vision or watery eyes Ears, nose, mouth, throat, and face: Denies mucositis or sore throat Respiratory: Denies cough. (+) SOB on exertion. Cardiovascular: Denies palpitation, chest discomfort or lower extremity swelling Gastrointestinal: mild heartburn, Denies nausea, vomiting or change in bowel habits  Skin: Denies abnormal skin rashes Musculoskeletal: (+) joint pain Lymphatics: Denies new lymphadenopathy or easy bruising Neurological: No weaknesses Behavioral/Psych: Mood is stable, no new changes  All other systems were reviewed with the patient and are negative.   PHYSICAL EXAMINATION: ECOG PERFORMANCE STATUS: 3  Vitals:   05/08/16 1430  BP: 108/60  Pulse: 88  Resp: 16  Temp: 98.4 F (36.9 C)   Filed Weights    GENERAL:alert, no distress and comfortable, cachectic  SKIN: skin color, texture, turgor are normal, no rashes or significant lesions EYES: normal, conjunctiva are pink and non-injected, sclera clear OROPHARYNX:no exudate, no erythema and lips, buccal mucosa, and tongue normal  NECK: supple, thyroid normal size, non-tender, without nodularity LYMPH:  no palpable lymphadenopathy in the cervical, axillary or inguinal LUNGS: clear to auscultation and percussion with normal breathing effort HEART: regular rate & rhythm and no murmurs and no lower extremity edema ABDOMEN:abdomen soft, non-tender and normal bowel sounds Musculoskeletal:no cyanosis of digits and no clubbing, (+) tenderness at right hip and buttock area. (+) Wheelchair PSYCH: alert & oriented x 3 with fluent speech NEURO: no focal motor/sensory deficits   LABORATORY DATA:  I have reviewed the data as listed CBC Latest Ref Rng & Units 05/08/2016 04/18/2016 04/11/2016  WBC 4.0 - 10.3 10e3/uL 7.6 7.6 7.3  Hemoglobin 13.0 - 17.1 g/dL 9.6(L) 10.1(L) 10.1(L)  Hematocrit 38.4 - 49.9 % 27.7(L) 29.9(L) 30.1(L)  Platelets 140 - 400 10e3/uL 146 142 150    CMP  Latest Ref Rng & Units 05/08/2016 04/18/2016 04/11/2016  Glucose 70 - 140 mg/dl 151(H) 140 161(H)  BUN 7.0 - 26.0 mg/dL 16.5 14.8 14.1  Creatinine 0.7 - 1.3 mg/dL 0.7 0.7 0.7  Sodium 136 - 145 mEq/L 139 139 139  Potassium 3.5 - 5.1 mEq/L 3.9 3.7 3.8  Chloride 101 - 111 mmol/L - - -  CO2 22 - 29 mEq/L '24 24 24  '$ Calcium 8.4 - 10.4 mg/dL 9.1 9.1 9.1  Total Protein 6.4 - 8.3 g/dL 7.9 8.0 8.2  Total Bilirubin 0.20 - 1.20 mg/dL 2.50(H) 0.78 0.72  Alkaline Phos 40 - 150 U/L 681(H) 640(H) 591(H)  AST 5 - 34 U/L 213(HH) 116(H) 118(H)  ALT 0 - 55 U/L 34 23 24   Pathology report  Diagnosis 01/26/2015 Bone, biopsy, r iliac - 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 Microscopic Comment The metastatic carcinoma is positive by immunohistochemistry with p63, cytokeratin 903 and cytokeratin 5/6. The tumor is negative with CD56, synaptophysin, chromogranin, thyroid transcription factor-1 and S100. (JDP:gt, 01/30/15)   RADIOGRAPHIC STUDIES: I have personally reviewed the radiological images as listed and agreed with the findings in the report.  PET/CT 05/08/16 IMPRESSION: 1. Significant progression of disease, including within the lungs, liver, and bones. 2. Development of small volume abdominopelvic ascites. 3. New posterior mediastinal nodal metastasis. 4. Right acetabular lesion without complicating fracture. The patient is predisposed to right-sided insufficiency fracture.  PET 12/20/2015 IMPRESSION: 1. Interval progression of disease. 2. Interval development of multiple hypermetabolic liver lesions, consistent with metastatic involvement. 3. Interval enlargement and associated increased hypermetabolism of the patient's known pulmonary metastases. 4. Interval increase in hypermetabolism of the right posterior twelfth rib lesion with abnormal hypermetabolism now seen in the right sacrum adjacent to the previously demonstrated right iliac disease. 5. New hypermetabolic  lesion in or just superficial to the left parotid gland. Metastatic disease at this location is a concern.    ASSESSMENT & PLAN: 70 y.o. African-American male, with past medical history of hypertension,  history of stroke, coronary artery disease, who presents with progressive dysphasia.  1. Distal esophagus squamous cell carcinoma, TxNxM1 with lung, bone and liver mets -We discussed his restaging PET scan from 05/08/16, which unfortunately showed significant progression of disease.  -will stop [place]  -He has had multiple lines of therapy, response from more chemotherapy is expected to be very low. Due to his deterioration of overall health and poor performance status, I do not think he will therapy is beneficial.  -We had a lengthy discussion about the goals of care, which is palliative, post patient and his family agrees. -I recommend him to focus on symptom control and palliative care -We discussed a referral to hospice care at home with a nurse coming once a week to help with medications, etc. The patient is receptive to this. -I will prescribe Morphine '20mg'$ /65m solution for his pain.  2. Leg and back pain -Secondary to his bone metastasis -I recommend him to restart morphine liquid, 10 mg every 6 hours as needed, and gradually titrate his dose. If needed, we'll add long-acting pain medication  3. Seizure -He is compliant with Dilantin.  -He follows with neurologist Dr. ADelice Lesch  4. HTN, CAD, history of CVA -Follow up with primary care physician -We'll watch his blood pressure closely when he is on chemotherapy, normal lately.  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 Hemoglobin 9.7 today. -I checked iron 72 (03/21/16) 72 and ferratin 773 (03/21/16). -His anemia has gotten worse lately, likely secondary to his pelvic irradiation. -He is not symptomatically, we'll continue observation  6. Anorexia and  weight loss  -I will prescribe Megace to stimulate his appetite. -I encouraged him to continue nutritional supplement. He was seen by our dietitian today   7. Goal of care discussion -We had a length discussion about his goals of care, which is palliative, patient and his family agrees. -Patient agreed to meet hospice team   PLAN -Lab results and PET scan reviewed with patient, KBeryle Flockwill be discontinued. -Referral to hospice. -Flush his port today.  All questions were answered. The patient knows to call the clinic with any problems, questions or concerns.  I spent 30 minutes counseling the patient face to face. The total time spent in the appointment was 40 minutes and more than 50% was on counseling.   FTruitt Merle MD 05/08/2016   This document serves as a record of services personally performed by YTruitt Merle MD. It was created on her behalf by JDarcus Austin a trained medical scribe. The creation of this record is based on the scribe's personal observations and the provider's statements to them. This document has been checked and approved by the attending provider.

## 2016-05-02 ENCOUNTER — Ambulatory Visit: Payer: Medicare Other | Admitting: Nurse Practitioner

## 2016-05-02 ENCOUNTER — Ambulatory Visit: Payer: Medicare Other

## 2016-05-02 ENCOUNTER — Other Ambulatory Visit: Payer: Medicare Other

## 2016-05-08 ENCOUNTER — Ambulatory Visit (HOSPITAL_BASED_OUTPATIENT_CLINIC_OR_DEPARTMENT_OTHER): Payer: Medicare Other

## 2016-05-08 ENCOUNTER — Other Ambulatory Visit: Payer: Medicare Other

## 2016-05-08 ENCOUNTER — Telehealth: Payer: Self-pay | Admitting: *Deleted

## 2016-05-08 ENCOUNTER — Encounter: Payer: Self-pay | Admitting: Hematology

## 2016-05-08 ENCOUNTER — Ambulatory Visit (HOSPITAL_COMMUNITY)
Admission: RE | Admit: 2016-05-08 | Discharge: 2016-05-08 | Disposition: A | Discharge status: Home / Self Care | Payer: Medicare Other | Source: Ambulatory Visit | Attending: Nurse Practitioner | Admitting: Nurse Practitioner

## 2016-05-08 ENCOUNTER — Ambulatory Visit: Payer: Medicare Other | Admitting: Nutrition

## 2016-05-08 ENCOUNTER — Other Ambulatory Visit: Payer: Self-pay | Admitting: *Deleted

## 2016-05-08 ENCOUNTER — Ambulatory Visit (HOSPITAL_BASED_OUTPATIENT_CLINIC_OR_DEPARTMENT_OTHER): Payer: Medicare Other | Admitting: Hematology

## 2016-05-08 ENCOUNTER — Ambulatory Visit: Payer: Medicare Other

## 2016-05-08 VITALS — BP 108/60 | HR 88 | Temp 98.4°F | Resp 16 | Ht 68.0 in

## 2016-05-08 DIAGNOSIS — C159 Malignant neoplasm of esophagus, unspecified: Secondary | ICD-10-CM

## 2016-05-08 DIAGNOSIS — M899 Disorder of bone, unspecified: Secondary | ICD-10-CM | POA: Diagnosis not present

## 2016-05-08 DIAGNOSIS — I10 Essential (primary) hypertension: Secondary | ICD-10-CM

## 2016-05-08 DIAGNOSIS — D6481 Anemia due to antineoplastic chemotherapy: Secondary | ICD-10-CM | POA: Diagnosis not present

## 2016-05-08 DIAGNOSIS — C7800 Secondary malignant neoplasm of unspecified lung: Secondary | ICD-10-CM

## 2016-05-08 DIAGNOSIS — C7951 Secondary malignant neoplasm of bone: Secondary | ICD-10-CM

## 2016-05-08 DIAGNOSIS — R188 Other ascites: Secondary | ICD-10-CM | POA: Insufficient documentation

## 2016-05-08 DIAGNOSIS — I2510 Atherosclerotic heart disease of native coronary artery without angina pectoris: Secondary | ICD-10-CM | POA: Diagnosis not present

## 2016-05-08 DIAGNOSIS — C781 Secondary malignant neoplasm of mediastinum: Secondary | ICD-10-CM | POA: Diagnosis not present

## 2016-05-08 DIAGNOSIS — C787 Secondary malignant neoplasm of liver and intrahepatic bile duct: Secondary | ICD-10-CM | POA: Insufficient documentation

## 2016-05-08 DIAGNOSIS — E440 Moderate protein-calorie malnutrition: Secondary | ICD-10-CM

## 2016-05-08 DIAGNOSIS — C155 Malignant neoplasm of lower third of esophagus: Secondary | ICD-10-CM | POA: Diagnosis not present

## 2016-05-08 DIAGNOSIS — Z7189 Other specified counseling: Secondary | ICD-10-CM | POA: Insufficient documentation

## 2016-05-08 DIAGNOSIS — D630 Anemia in neoplastic disease: Secondary | ICD-10-CM

## 2016-05-08 DIAGNOSIS — Z95828 Presence of other vascular implants and grafts: Secondary | ICD-10-CM

## 2016-05-08 LAB — CBC & DIFF AND RETIC
BASO%: 0.4 % (ref 0.0–2.0)
BASOS ABS: 0 10*3/uL (ref 0.0–0.1)
EOS ABS: 0.3 10*3/uL (ref 0.0–0.5)
EOS%: 3.4 % (ref 0.0–7.0)
HCT: 27.7 % — ABNORMAL LOW (ref 38.4–49.9)
HEMOGLOBIN: 9.6 g/dL — AB (ref 13.0–17.1)
IMMATURE RETIC FRACT: 17.3 % — AB (ref 3.00–10.60)
LYMPH#: 0.8 10*3/uL — AB (ref 0.9–3.3)
LYMPH%: 9.9 % — ABNORMAL LOW (ref 14.0–49.0)
MCH: 31 pg (ref 27.2–33.4)
MCHC: 34.7 g/dL (ref 32.0–36.0)
MCV: 89.4 fL (ref 79.3–98.0)
MONO#: 0.7 10*3/uL (ref 0.1–0.9)
MONO%: 9.7 % (ref 0.0–14.0)
NEUT#: 5.8 10*3/uL (ref 1.5–6.5)
NEUT%: 76.6 % — ABNORMAL HIGH (ref 39.0–75.0)
NRBC: 0 % (ref 0–0)
Platelets: 146 10*3/uL (ref 140–400)
RBC: 3.1 10*6/uL — ABNORMAL LOW (ref 4.20–5.82)
RDW: 16.6 % — ABNORMAL HIGH (ref 11.0–14.6)
RETIC %: 1.65 % (ref 0.80–1.80)
Retic Ct Abs: 51.15 10*3/uL (ref 34.80–93.90)
WBC: 7.6 10*3/uL (ref 4.0–10.3)

## 2016-05-08 LAB — COMPREHENSIVE METABOLIC PANEL
ALBUMIN: 2.6 g/dL — AB (ref 3.5–5.0)
ALT: 34 U/L (ref 0–55)
AST: 213 U/L — AB (ref 5–34)
Alkaline Phosphatase: 681 U/L — ABNORMAL HIGH (ref 40–150)
Anion Gap: 14 mEq/L — ABNORMAL HIGH (ref 3–11)
BUN: 16.5 mg/dL (ref 7.0–26.0)
CO2: 24 meq/L (ref 22–29)
Calcium: 9.1 mg/dL (ref 8.4–10.4)
Chloride: 101 mEq/L (ref 98–109)
Creatinine: 0.7 mg/dL (ref 0.7–1.3)
EGFR: >90 mL/min/{1.73_m2} (ref >90)
GLUCOSE: 151 mg/dL — AB (ref 70–140)
POTASSIUM: 3.9 meq/L (ref 3.5–5.1)
SODIUM: 139 meq/L (ref 136–145)
Total Bilirubin: 2.5 mg/dL — ABNORMAL HIGH (ref 0.20–1.20)
Total Protein: 7.9 g/dL (ref 6.4–8.3)

## 2016-05-08 LAB — TSH: TSH: 3.16 m(IU)/L (ref 0.320–4.118)

## 2016-05-08 LAB — GLUCOSE, CAPILLARY: Glucose-Capillary: 115 mg/dL — ABNORMAL HIGH (ref 65–99)

## 2016-05-08 MED ORDER — FLUDEOXYGLUCOSE F - 18 (FDG) INJECTION
6.2000 | Freq: Once | INTRAVENOUS | Status: AC | PRN
  Administered 2016-05-08: 6.2 via INTRAVENOUS

## 2016-05-08 MED ORDER — SODIUM CHLORIDE 0.9 % IJ SOLN
10.0000 mL | INTRAMUSCULAR | Status: DC | PRN
  Administered 2016-05-08: 10 mL via INTRAVENOUS
  Filled 2016-05-08: qty 10

## 2016-05-08 MED ORDER — MORPHINE SULFATE 20 MG/5ML PO SOLN: 5.0000 mg | ORAL | 0 refills | Status: AC | PRN

## 2016-05-08 MED ORDER — MEGESTROL ACETATE 625 MG/5ML PO SUSP: 625.0000 mg | Freq: Every day | ORAL | 0 refills | Status: AC

## 2016-05-08 MED ORDER — HEPARIN SOD (PORK) LOCK FLUSH 100 UNIT/ML IV SOLN
500.0000 [IU] | Freq: Once | INTRAVENOUS | Status: AC | PRN
  Administered 2016-05-08: 500 [IU] via INTRAVENOUS
  Filled 2016-05-08: qty 5

## 2016-05-08 MED FILL — MORPHINE SULF 20 MG/5 ML SO: 20 | 13 days supply | Qty: 100 | Fill #0

## 2016-05-08 NOTE — Progress Notes (Signed)
Brief visit with patient. Chemotherapy was cancelled secondary to disease progression. Provided samples of Boost Plus for patient along with coupons and encouraged them to contact me if they had questions.

## 2016-05-08 NOTE — Telephone Encounter (Signed)
Hospice referral made in EPIC & called [facility] to notify of referral.  Dr Burr Medico to be attending.

## 2016-05-09 ENCOUNTER — Telehealth: Payer: Self-pay | Admitting: Hematology

## 2016-05-09 NOTE — Telephone Encounter (Signed)
No LOS/ORDERS per 05/08/16 los.

## 2016-05-22 ENCOUNTER — Telehealth: Payer: Self-pay | Admitting: *Deleted

## 2016-05-29 ENCOUNTER — Other Ambulatory Visit: Payer: Self-pay | Admitting: Nurse Practitioner

## 2016-06-19 NOTE — Telephone Encounter (Signed)
Received message from Nancy/[facility] that pt passed away today @ [phone].  He was transferred there yesterday & she thanked [place] for the care that was given by Dr Burr Medico.  Dr Burr Medico & HIM notified.

## 2016-06-19 DEATH — deceased

## 2016-08-19 ENCOUNTER — Ambulatory Visit: Payer: Medicare Other | Admitting: Neurology
